# Patient Record
Sex: Male | Born: 1935
Health system: Southern US, Community
[De-identification: ages and names within clinical notes are randomized; demographics above are authoritative.]

## PROBLEM LIST (undated history)

## (undated) DIAGNOSIS — I2510 Atherosclerotic heart disease of native coronary artery without angina pectoris: Secondary | ICD-10-CM

## (undated) DIAGNOSIS — E782 Mixed hyperlipidemia: Secondary | ICD-10-CM

## (undated) DIAGNOSIS — K219 Gastro-esophageal reflux disease without esophagitis: Secondary | ICD-10-CM

## (undated) DIAGNOSIS — I119 Hypertensive heart disease without heart failure: Secondary | ICD-10-CM

## (undated) DIAGNOSIS — I351 Nonrheumatic aortic (valve) insufficiency: Secondary | ICD-10-CM

## (undated) HISTORY — DX: Mixed hyperlipidemia: E78.2

## (undated) HISTORY — DX: Nonrheumatic aortic (valve) insufficiency: I35.1

## (undated) HISTORY — DX: Atherosclerotic heart disease of native coronary artery without angina pectoris: I25.10

## (undated) HISTORY — DX: Gastro-esophageal reflux disease without esophagitis: K21.9

## (undated) HISTORY — DX: Hypertensive heart disease without heart failure: I11.9

---

## 1998-08-09 HISTORY — PX: CARDIAC CATHETERIZATION: SHX172

## 1999-06-08 ENCOUNTER — Inpatient Hospital Stay (HOSPITAL_COMMUNITY): Admission: EM | Admit: 1999-06-08 | Discharge: 1999-06-11 | Payer: Self-pay | Admitting: Emergency Medicine

## 1999-06-30 ENCOUNTER — Encounter: Admission: RE | Admit: 1999-06-30 | Discharge: 1999-06-30 | Payer: Self-pay | Admitting: Internal Medicine

## 2003-08-10 DIAGNOSIS — I119 Hypertensive heart disease without heart failure: Secondary | ICD-10-CM

## 2003-08-10 HISTORY — DX: Hypertensive heart disease without heart failure: I11.9

## 2005-02-24 ENCOUNTER — Ambulatory Visit: Payer: Self-pay | Admitting: Internal Medicine

## 2005-02-24 LAB — HM COLONOSCOPY

## 2005-04-09 ENCOUNTER — Ambulatory Visit: Payer: Self-pay | Admitting: Cardiology

## 2006-08-09 HISTORY — PX: COLONOSCOPY: SHX174

## 2007-03-07 ENCOUNTER — Ambulatory Visit: Payer: Self-pay | Admitting: Cardiology

## 2007-03-15 ENCOUNTER — Ambulatory Visit: Payer: Self-pay

## 2007-03-15 ENCOUNTER — Encounter: Payer: Self-pay | Admitting: Cardiology

## 2007-03-15 LAB — CONVERTED CEMR LAB
ALT: 22 units/L (ref 0–53)
AST: 20 units/L (ref 0–37)
Albumin: 3.8 g/dL (ref 3.5–5.2)
Alkaline Phosphatase: 62 units/L (ref 39–117)
BUN: 17 mg/dL (ref 6–23)
Bilirubin, Direct: 0.1 mg/dL (ref 0.0–0.3)
CO2: 27 meq/L (ref 19–32)
Calcium: 9.4 mg/dL (ref 8.4–10.5)
Chloride: 109 meq/L (ref 96–112)
Cholesterol: 134 mg/dL (ref 0–200)
Creatinine, Ser: 1 mg/dL (ref 0.4–1.5)
GFR calc Af Amer: 96 mL/min
GFR calc non Af Amer: 79 mL/min
Glucose, Bld: 108 mg/dL — ABNORMAL HIGH (ref 70–99)
HDL: 36.3 mg/dL — ABNORMAL LOW (ref >39.0)
LDL Cholesterol: 83 mg/dL (ref 0–99)
Potassium: 3.9 meq/L (ref 3.5–5.1)
Sodium: 141 meq/L (ref 135–145)
Total Bilirubin: 1.2 mg/dL (ref 0.3–1.2)
Total CHOL/HDL Ratio: 3.7
Total Protein: 6.6 g/dL (ref 6.0–8.3)
Triglycerides: 73 mg/dL (ref 0–149)
VLDL: 15 mg/dL (ref 0–40)

## 2007-05-12 ENCOUNTER — Ambulatory Visit: Payer: Self-pay | Admitting: Cardiology

## 2007-07-27 ENCOUNTER — Ambulatory Visit: Payer: Self-pay | Admitting: Cardiology

## 2007-07-27 ENCOUNTER — Encounter: Payer: Self-pay | Admitting: Cardiology

## 2007-07-27 LAB — CONVERTED CEMR LAB
ALT: 23 units/L (ref 0–53)
AST: 18 units/L (ref 0–37)
Albumin: 4.3 g/dL (ref 3.5–5.2)
Alkaline Phosphatase: 63 units/L (ref 39–117)
Bilirubin, Direct: 0.2 mg/dL (ref 0.0–0.3)
Cholesterol: 132 mg/dL (ref 0–200)
HDL: 51 mg/dL (ref >39)
Indirect Bilirubin: 0.6 mg/dL (ref 0.0–0.9)
LDL Cholesterol: 69 mg/dL (ref 0–99)
Total Bilirubin: 0.8 mg/dL (ref 0.3–1.2)
Total CHOL/HDL Ratio: 2.6
Total Protein: 6.9 g/dL (ref 6.0–8.3)
Triglycerides: 60 mg/dL (ref <150)
VLDL: 12 mg/dL (ref 0–40)

## 2008-10-25 ENCOUNTER — Ambulatory Visit: Payer: Self-pay | Admitting: Cardiology

## 2008-11-01 ENCOUNTER — Ambulatory Visit: Payer: Self-pay | Admitting: Cardiology

## 2008-11-01 LAB — CONVERTED CEMR LAB
ALT: 17 units/L (ref 0–53)
AST: 18 units/L (ref 0–37)
Albumin: 4.2 g/dL (ref 3.5–5.2)
Alkaline Phosphatase: 65 units/L (ref 39–117)
BUN: 19 mg/dL (ref 6–23)
CO2: 20 meq/L (ref 19–32)
Calcium: 8.9 mg/dL (ref 8.4–10.5)
Chloride: 110 meq/L (ref 96–112)
Cholesterol: 140 mg/dL (ref 0–200)
Creatinine, Ser: 0.92 mg/dL (ref 0.40–1.50)
Glucose, Bld: 95 mg/dL (ref 70–99)
HDL: 45 mg/dL (ref >39)
LDL Cholesterol: 80 mg/dL (ref 0–99)
Potassium: 4.1 meq/L (ref 3.5–5.3)
Sodium: 141 meq/L (ref 135–145)
Total Bilirubin: 0.7 mg/dL (ref 0.3–1.2)
Total CHOL/HDL Ratio: 3.1
Total Protein: 6.9 g/dL (ref 6.0–8.3)
Triglycerides: 76 mg/dL (ref <150)
VLDL: 15 mg/dL (ref 0–40)

## 2008-11-19 ENCOUNTER — Ambulatory Visit: Payer: Self-pay

## 2008-11-19 ENCOUNTER — Encounter: Payer: Self-pay | Admitting: Cardiology

## 2008-12-20 ENCOUNTER — Ambulatory Visit: Payer: Self-pay | Admitting: Cardiology

## 2008-12-20 LAB — CONVERTED CEMR LAB
ALT: 16 units/L (ref 0–53)
AST: 18 units/L (ref 0–37)
Albumin: 4.2 g/dL (ref 3.5–5.2)
Alkaline Phosphatase: 61 units/L (ref 39–117)
Bilirubin, Direct: 0.1 mg/dL (ref 0.0–0.3)
Cholesterol: 130 mg/dL (ref 0–200)
HDL: 40 mg/dL (ref >39)
Indirect Bilirubin: 0.6 mg/dL (ref 0.0–0.9)
LDL Cholesterol: 76 mg/dL (ref 0–99)
Total Bilirubin: 0.7 mg/dL (ref 0.3–1.2)
Total CHOL/HDL Ratio: 3.3
Total Protein: 6.9 g/dL (ref 6.0–8.3)
Triglycerides: 70 mg/dL (ref <150)
VLDL: 14 mg/dL (ref 0–40)

## 2008-12-27 DIAGNOSIS — I359 Nonrheumatic aortic valve disorder, unspecified: Secondary | ICD-10-CM | POA: Insufficient documentation

## 2008-12-27 DIAGNOSIS — E785 Hyperlipidemia, unspecified: Secondary | ICD-10-CM | POA: Insufficient documentation

## 2008-12-27 DIAGNOSIS — I2510 Atherosclerotic heart disease of native coronary artery without angina pectoris: Secondary | ICD-10-CM

## 2008-12-27 DIAGNOSIS — I10 Essential (primary) hypertension: Secondary | ICD-10-CM

## 2010-01-06 ENCOUNTER — Ambulatory Visit: Payer: Self-pay

## 2010-01-06 ENCOUNTER — Encounter: Payer: Self-pay | Admitting: Cardiology

## 2010-06-02 ENCOUNTER — Telehealth: Payer: Self-pay | Admitting: Cardiology

## 2010-08-20 ENCOUNTER — Telehealth: Payer: Self-pay | Admitting: Cardiology

## 2010-09-08 NOTE — Progress Notes (Signed)
Summary: refill/ pt out medication  Phone Note Refill Request Message from:  Patient on June 02, 2010 10:03 AM  Refills Requested: Medication #1:  ENALAPRIL MALEATE 20 MG TABS Take 1 tablet by mouth once a day [facility] [phone]  Initial call taken by: Judie Grieve,  June 02, 2010 10:04 AM  Follow-up for Phone Call       Follow-up by: Judithe Modest CMA,  June 02, 2010 10:39 AM    Prescriptions: ENALAPRIL MALEATE 20 MG TABS (ENALAPRIL MALEATE) Take 1 tablet by mouth once a day  #30 Tablet x 6   Entered by:   Judithe Modest CMA   Authorized by:   Marca Ancona, MD   Signed by:   Judithe Modest CMA on 06/02/2010   Method used:   Electronically to        [facility]. [address] [phone]* (retail)       [address] [place], [place]  [id number]       Ph: [phone]       Fax: [phone]   RxID:   [id number]

## 2010-09-10 NOTE — Progress Notes (Signed)
Summary: refill meds  Phone Note Refill Request Call back at Home Phone [phone]   Refills Requested: Medication #1:  SIMVASTATIN 40 MG TABS Take one tablet by mouth daily at bedtime. [facility] in [place] [phone]   Method Requested: Fax to Local Pharmacy Initial call taken by: Lorne Skeens,  August 20, 2010 5:02 PM    Additional Follow-up for Phone Call Additional follow up Details #2::    This med was filled already I will manually fax it into the pharamacy Follow-up by: Burnett Kanaris, CNA,  August 21, 2010 9:55 AM

## 2010-11-03 ENCOUNTER — Other Ambulatory Visit: Payer: Self-pay | Admitting: Cardiology

## 2010-11-05 NOTE — Telephone Encounter (Signed)
[address] °

## 2010-12-21 ENCOUNTER — Other Ambulatory Visit: Payer: Self-pay | Admitting: Cardiology

## 2010-12-22 NOTE — Assessment & Plan Note (Signed)
[facility] OFFICE NOTE   KEM, HENSEN                         MRN:          [id number]  DATE:05/12/2007                            DOB:          1935/09/06    Mr. Bultman is a very pleasant 75 year old gentleman who has a history of  coronary disease.  He has had previous catheterization in October 2000  that showed an LAD with a distal diffuse 50% lesion that filled distally  from left to left collaterals originating from the LAD and circumflex.  The rest of his coronary anatomy was normal.  When I last saw him, we  scheduled him to have a Myoview, which was performed on March 15, 2007.  His ejection fraction was 59%, and there was normal perfusion.  An  echocardiogram performed on March 15, 2007 showed normal LV function,  mild to moderate aortic insufficiency, moderate left atrial enlargement.  Since I last saw him, he is doing well from a symptomatic standpoint.  There is no dyspnea on exertion, orthopnea, PND, pedal edema,  palpitations, presyncope, syncope, or chest pain.  There was no  claudication.   MEDICATIONS:  Include:  1. Atenolol 25 mg p.o. daily.  2. Lovastatin 40 mg p.o. daily.  3. Aspirin 325 mg p.o. daily.  4. Omeprazole 40 mg p.o. daily.  5. Enalapril 20 mg p.o. daily.   PHYSICAL EXAMINATION:  Shows a blood pressure of 162/82.  His pulse is  60.  He weighs 181 pounds.  HEENT:  Normal.  NECK:  Supple with no bruits.  CHEST:  Clear.  CARDIOVASCULAR EXAM:  Regular rate and rhythm.  ABDOMINAL EXAM:  Benign.  EXTREMITIES:  Show no edema.   DIAGNOSES:  1. Coronary artery disease.  Mr. Bartolomei is having no chest pain or      shortness of breath.  His recent Myoview showed normal perfusion.      We will continue with medical therapy.  This will include his      aspirin, beta blocker, ACE inhibitor, and statin.  2. Hyperlipidemia.  His recent lipids and liver showed an LDL of 83.      His HDL  was mildly decreased at 36.3.  We recommended Niaspan, but      he states it is not covered by insurance, and he cannot afford      this.  We will, therefore, increase his lovastatin to 80 mg p.o.      daily.  I will check lipids and liver in 6 weeks.  3. Hypertension.  His blood pressure is mildly elevated today.  I have      asked him to obtain a blood pressure cuff for home monitoring, and      to track this for [place].  If his systolics are greater than 140 or if      his diastolics are greater than 85, then he will contact us and we      will increase his enalapril further versus adding low-dose      hydrochlorothiazide.  4. Mild to moderate aortic  insufficiency.  This will be followed as an      outpatient.   He will continue with risk factor modification including diet and  exercise.  I will see him back in 9 months.     [name] [name], MD, [facility]  Electronically Signed    [name]  DD: 05/12/2007  DT: 05/12/2007  Job #: [id number]

## 2010-12-22 NOTE — Assessment & Plan Note (Signed)
[facility] OFFICE NOTE   James Mora, James Mora                         MRN:          [id number]  DATE:10/25/2008                            DOB:          1936/03/01    PRIMARY CARE DOCTOR:  Conley Simmonds, PA at [facility].   HISTORY OF PRESENT ILLNESS:  This is a 75 year old with a history of  coronary artery disease, hypertension, and hyperlipidemia.  The patient  did have a non-ST elevation MI in 2000.  He was found by left heart  catheterization to have severe diffuse distal LAD disease  The distal  LAD did fill by left-to-left collaterals.  He additionally had a Myoview  done in August 2008 that showed normal perfusion with no ischemia or  infarction.  Since he was last seen in our clinic, the patient has been  doing quite well.  He has had no episodes of chest pain.  He is quite  active.  He works part-time at [employer].  He is on his feet most of the  day.  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  He has no orthopnea or PND.  No  palpitations or syncope.   PAST MEDICAL HISTORY:  1. Coronary artery disease.  The patient did have a non-ST elevation      MI in 2000.  Left heart catheterization showed severe diffuse      distal LAD disease.  The distal LAD did fill by left-to-left      collaterals from the more proximal LAD and the circumflex.  He had      a Myoview done in August 2008, which showed EF of 59% and normal      perfusion.  2. Hyperlipidemia.  3. Hypertension.  4. Mild-to-moderate aortic insufficiency.  The patient's most recent      echocardiogram was in August 2008 showed an EF of 60%.  There is      mild-to-moderate aortic insufficiency.  There is no aortic      stenosis.  There was moderate left atrial enlargement.   SOCIAL HISTORY:  The patient works part-time at [employer].  He has never  smoked.   EKG today  shows normal sinus rhythm and has a normal EKG.   MEDICATIONS:  1. Atenolol 25 mg daily.  2. Aspirin 325 mg daily.  3. Omeprazole 20 mg daily.  4. Enalapril 20 mg daily.  5. Lovastatin 80 mg daily.   PHYSICAL EXAMINATION:  VITAL SIGNS:  Blood pressure is 152/98 and heart  rate is 76.  GENERAL:  This is a well-developed male in no apparent distress.  NEUROLOGIC:  Alert and orient x3.  Normal affect.  NECK:  There is no JVD.  There is no thyromegaly or thyroid nodule.  LUNGS:  Clear to auscultation bilaterally.  Normal respiratory effort.  CARDIOVASCULAR:  Heart regular.  S1 and S2.  There is a soft S4.  There  is a 1/6 early peaking systolic murmur at the right upper sternal  border.  There is no diastolic murmur that I can hear.  The patient does  have prominent carotid upstrokes.  EXTREMITIES:  There is 1+ edema of the ankles and there are 2+ posterior  tibial pulses bilaterally.  No clubbing or cyanosis.  ABDOMEN:  Soft, nontender.  No hepatosplenomegaly.  Normal bowel sounds.   ASSESSMENT AND PLAN:  This is a 75 year old with history of coronary  artery disease, hyperlipidemia, and hypertension, who presents to  Cardiology clinic for followup of his artery coronary disease.  1. Coronary artery disease.  The patient has had no recent ischemic      symptoms.  He had a normal treadmill Myoview in 2008.  He is on a      good regimen of medications.  We will continue him on his aspirin,      beta-blocker, ACE inhibitor, and statin.  2. Hyperlipidemia.  We have no recent lipids done, we will bring the      patient back next week to check his fasting lipids and LFTs.  If      his LDL is greater than 70, probably we would change his statin to      simvastatin or atorvastatin to try to work for a goal LDL less than      70.  3. Hypertension.  The patient's blood pressure is elevated at 152/98      today.  We will increase his atenolol today to 50 mg daily.  When      he comes back for  his blood work next week, we will check his blood      pressure.  If his blood pressure is still greater than 140/90, we      will increase his enalapril at that time.  4. Aortic insufficiency.  The patient did have mild moderate aortic      insufficiency on an echocardiogram in 2008.  Probably it is time      for him to get a repeat screening echocardiogram to further assess      his aortic valve.  I do not hear a significant diastolic murmur.      However, he does have quite prominent carotid upstrokes.  We will      get an echocardiogram done in the next couple of weeks.     Marca Ancona, MD  Electronically Signed    DM/MedQ  DD: 10/25/2008  DT: 10/26/2008  Job #: [id number]   cc:   Dortha Kern, PA

## 2010-12-22 NOTE — Assessment & Plan Note (Signed)
[facility]                            CARDIOLOGY OFFICE NOTE   BRENNON, OTTERNESS                         MRN:          [id number]  DATE:03/07/2007                            DOB:          16-Mar-1936    James Mora is a very pleasant gentleman that has previously been followed  by Dr. Andee Lineman.  He had a non-Q wave myocardial infarction in 2000, and  underwent cardiac catheterization at that time.  He left main was  normal.  He had distal diffuse 50% disease that filled from left  collaterals originating from the LAD and left collaterals originating  from the circumflex.  The circumflex and mitral coronary arteries were  normal.  His ejection fraction was preserved, but there was an area of  inferior apical akinesis.  His most recent echocardiogram was performed  on March 13, 2002.  He had normal LV function, mild aortic root  dilatation, and mild aortic insufficiency.  His most recent Myoview was  performed on October 24, 2002.  His ejection fraction was 60%.  There was  normal perfusion.  Since he was last seen, he denies any dyspnea on  exertion, orthopnea, PND, palpitations, presyncope, syncope, or chest  pain.  He occasionally has minimal pedal edema.  He does not smoke.  He  is not exercising routinely, but he is following a diet.   His medications include:  1. Enalapril 5 mg p.o. daily.  2. Atenolol 25 mg p.o. daily.  3. Lovastatin 40 mg p.o. daily.  4. Aspirin 325 mg p.o. daily.  5. Omeprazole 40 mg p.o. daily.   PHYSICAL EXAMINATION:  Shows a blood pressure of 160/100, and his pulse  is 67.  He weighs 189 pounds.  HEENT:  Normal.  NECK:  Supple with no bruits.  CHEST:  Clear.  CARDIOVASCULAR:  Exam reveals a regular rate and rhythm.  ABDOMINAL EXAM:  Benign with no pulsatile masses, and no bruits.  EXTREMITIES:  Show no edema.  His electrocardiogram shows a sinus rhythm at a rate of 67.  There were  no ST changes noted.   DIAGNOSES:  1.  Coronary artery disease.  The patient is doing well from a      symptomatic standpoint with no chest pain or shortness of breath.      However, it has been over 4 years since his previous stress test.      We will proceed with this in the near future.  If it shows no      ischemia, then we will continue with medical therapy.  He will      continue on his aspirin, ACE inhibitor, beta blocker, and statin.  2. Hyperlipidemia.  He will continue on his statin.  We will check      lipids and liver, and adjust with a goal LDL of less than 70.  3. Hypertension.  His blood pressure is elevated today.  We will      increase his enalapril to 20 mg p.o. daily.  I will check a BMET in      1  week to follow his potassium and renal function.  4. History of mildly dilated aortic root and mild aortic      insufficiency.  We will repeat his echocardiogram.   I will see him back in 12 months if his Myoview shows no ischemia.  He  will continue with risk factor modification.  We discussed the  importance of diet and exercise.     [name] [name], MD, [facility]  Electronically Signed    [name]  DD: 03/07/2007  DT: 03/08/2007  Job #: [id number]

## 2011-03-03 ENCOUNTER — Encounter: Payer: Self-pay | Admitting: Cardiology

## 2011-03-23 ENCOUNTER — Encounter: Payer: Self-pay | Admitting: *Deleted

## 2011-03-24 ENCOUNTER — Encounter: Payer: Self-pay | Admitting: *Deleted

## 2011-04-05 ENCOUNTER — Other Ambulatory Visit: Payer: Self-pay

## 2011-04-05 ENCOUNTER — Other Ambulatory Visit: Payer: Self-pay | Admitting: Cardiology

## 2011-04-05 DIAGNOSIS — I359 Nonrheumatic aortic valve disorder, unspecified: Secondary | ICD-10-CM

## 2011-04-06 ENCOUNTER — Other Ambulatory Visit (INDEPENDENT_AMBULATORY_CARE_PROVIDER_SITE_OTHER): Payer: Medicare Other | Admitting: *Deleted

## 2011-04-06 DIAGNOSIS — I359 Nonrheumatic aortic valve disorder, unspecified: Secondary | ICD-10-CM

## 2011-07-30 ENCOUNTER — Other Ambulatory Visit: Payer: Self-pay | Admitting: Cardiology

## 2011-09-05 ENCOUNTER — Other Ambulatory Visit: Payer: Self-pay | Admitting: Cardiology

## 2011-09-30 ENCOUNTER — Other Ambulatory Visit: Payer: Self-pay | Admitting: Cardiology

## 2011-09-30 NOTE — Telephone Encounter (Signed)
..   Requested Prescriptions   Pending Prescriptions Disp Refills  . atenolol (TENORMIN) 50 MG tablet [Pharmacy Med Name: ATENOLOL 50 MG TABLET] 30 tablet 1    Sig: take 1 tablet by mouth once daily

## 2011-10-06 ENCOUNTER — Other Ambulatory Visit: Payer: Self-pay | Admitting: Cardiology

## 2011-10-13 ENCOUNTER — Telehealth: Payer: Self-pay | Admitting: *Deleted

## 2011-10-13 ENCOUNTER — Telehealth: Payer: Self-pay | Admitting: Cardiology

## 2011-10-13 NOTE — Telephone Encounter (Signed)
Pt called in today upset that last month and yesterday for his refill for enalapril he had no further refills on the bottle. I explained to the pt that he had not seen Dr. Shirlee Latch since 2010 in the [facility] office. Pt then states he had an echo done 03/2011, I told him the echo has no bearing on his refills, that he needs and appt to see the Dr. Rock Nephew then asked if he could see Dr. Shirlee Latch this Friday 10/15/11. I explained the dr had no openings on that day, but that I will send a note to Dr. Alford Highland nurse Katina Dung, RN to s/w Dr. Shirlee Latch as to what he wants the pt to do (ie) to be seen with him in the [facility] office or switch pt to either Dr. Mariah Milling or Dr. Kirke Corin in the [facility] office. I told pt that if he did not hear anything by the end of next week 3/15 then to please call  [phone] [facility] office to see what Dr. Shirlee Latch says, pt gave me verbal understanding to all of this. Danielle Rankin

## 2011-10-13 NOTE — Telephone Encounter (Signed)
Could try to squeeze him into my Friday [place] schedule.  I will not be in [place] any longer after that.  If he can't come Friday, he can either see me in [place] next week or if he wants to stay in [place], see Dr. Mariah Milling next week.

## 2011-10-13 NOTE — Telephone Encounter (Signed)
Will forward to Carol Fiato. 

## 2011-10-14 ENCOUNTER — Encounter: Payer: Self-pay | Admitting: *Deleted

## 2011-10-14 ENCOUNTER — Ambulatory Visit: Payer: Medicare Other | Admitting: Physician Assistant

## 2011-10-15 ENCOUNTER — Encounter: Payer: Self-pay | Admitting: Cardiology

## 2011-10-15 ENCOUNTER — Ambulatory Visit (INDEPENDENT_AMBULATORY_CARE_PROVIDER_SITE_OTHER): Payer: Medicare Other | Admitting: Cardiology

## 2011-10-15 VITALS — BP 177/100 | HR 68 | Ht 70.0 in | Wt 187.0 lb

## 2011-10-15 DIAGNOSIS — I359 Nonrheumatic aortic valve disorder, unspecified: Secondary | ICD-10-CM

## 2011-10-15 DIAGNOSIS — I10 Essential (primary) hypertension: Secondary | ICD-10-CM

## 2011-10-15 DIAGNOSIS — I2510 Atherosclerotic heart disease of native coronary artery without angina pectoris: Secondary | ICD-10-CM

## 2011-10-15 DIAGNOSIS — E785 Hyperlipidemia, unspecified: Secondary | ICD-10-CM

## 2011-10-15 MED ORDER — AMLODIPINE BESYLATE 5 MG PO TABS: 5.0000 mg | ORAL_TABLET | Freq: Every day | ORAL | Status: DC

## 2011-10-15 MED ORDER — ATENOLOL 50 MG PO TABS: 50.0000 mg | ORAL_TABLET | Freq: Every day | ORAL | Status: DC

## 2011-10-15 MED ORDER — ENALAPRIL MALEATE 20 MG PO TABS: 20.0000 mg | ORAL_TABLET | Freq: Every day | ORAL | Status: DC

## 2011-10-15 NOTE — Patient Instructions (Addendum)
Need to obtain liver/lipid from Dr. Christell Faith office. Start on Amlodipine 5 mg take one tablet daily. Follow up with Dr. Kirke Corin in 3 months. Have patient take blood pressure for two weeks and bring the readings to the office for the doctor to review.

## 2011-10-16 NOTE — Assessment & Plan Note (Signed)
Stable with no ischemic symptoms.  Continue ASA 81, statin, beta blocker, ACEI.

## 2011-10-16 NOTE — Assessment & Plan Note (Signed)
Lipids done recently with PCP.  Will call for copy of labs.  Goal LDL < 70.   As I am going to be only in the [place] office in the future, I will have Mr Carline followup with Dr. Kirke Corin in 3 months in the [place] office.

## 2011-10-16 NOTE — Progress Notes (Signed)
PCP: Dr. Joanie Coddington  76 yo with history of CAD and aortic insufficiency presents for cardiology followup.  I have not seen him for a couple of years now.  He has been doing well from a cardiac standpoint.  No chest pain, no exertional dyspnea.  He is active in general.  His last echo in 8/12 showed mild to moderate aortic insufficiency and preserved EF. His last myoview in 2008 was low risk.    James Mora has been under a lot of stress recently.  He lost his job at [employer] recently.  He was also found to have elevated PSA and has a urology appointment.  His BP has been running higher, it is 177/100 today.  He has been compliant with his medications.    ECG: NSR, inferior Qs  PMH: 1. CAD: NSTEMI in 2000 with severe diffuse distal LAD disease and L=>L collaterals.  Myoview in 2008 with EF 59%, no ischemia or infarction.  2. Aortic insufficiency: Most recent echo in 8/12 showed EF 55-60%, mild LV hypertrophy, grade I diastolic dysfunction, mild to moderate AI, mild James.  AI had been moderate on 2008 echo.  3. HTN 4. Hyperlipidemia 5. Elevated PSA  SH: Nonsmoker.  Worked for [employer] at [employer] but recently lost his job.  Lives in [place].   FH: CAD  ROS: All systems reviewed and negative except as per HPI.   Current Outpatient Prescriptions  Medication Sig Dispense Refill  . aspirin 81 MG tablet Take 81 mg by mouth daily.      Marland Kitchen atenolol (TENORMIN) 50 MG tablet Take 1 tablet (50 mg total) by mouth daily.  30 tablet  6  . enalapril (VASOTEC) 20 MG tablet Take 1 tablet (20 mg total) by mouth daily.  30 tablet  6  . nitroGLYCERIN (NITROSTAT) 0.4 MG SL tablet Place 0.4 mg under the tongue every 5 (five) minutes as needed.        Marland Kitchen omeprazole (PRILOSEC) 20 MG capsule Take 20 mg by mouth daily.      . simvastatin (ZOCOR) 40 MG tablet Take 40 mg by mouth at bedtime.        Marland Kitchen amLODipine (NORVASC) 5 MG tablet Take 1 tablet (5 mg total) by mouth daily.  30 tablet  6    BP 177/100  Pulse 68   Ht 5\' 10"  (1.778 m)  Wt 187 lb (84.823 kg)  BMI 26.83 kg/m2  SpO2 95% General: NAD Neck: No JVD, no thyromegaly or thyroid nodule.  Lungs: Clear to auscultation bilaterally with normal respiratory effort. CV: Nondisplaced PMI.  Heart regular S1/S2, +S4, 1/6 SEM RUSB (no diastolic murmur).  Trace ankle edema.  No carotid bruit.  Normal pedal pulses.  Abdomen: Soft, nontender, no hepatosplenomegaly, no distention.  Neurologic: Alert and oriented x 3.  Psych: Normal affect. Extremities: No clubbing or cyanosis.

## 2011-10-16 NOTE — Assessment & Plan Note (Signed)
Mild to moderate on last echo, seems somewhat improved.  No diastolic murmur.  Need to keep BP controlled to limit progression.

## 2011-10-16 NOTE — Assessment & Plan Note (Signed)
BP too high.  Continue atenolol and enalapril.  Add amlodipine 5 mg daily.  He will check his BP and we will call for BP numbers in about 2 wks to see if amlodipine needs to be titrated up.

## 2011-10-20 ENCOUNTER — Telehealth: Payer: Self-pay | Admitting: *Deleted

## 2011-10-20 MED ORDER — ATORVASTATIN CALCIUM 20 MG PO TABS: 20.0000 mg | ORAL_TABLET | Freq: Every day | ORAL | Status: DC

## 2011-10-20 NOTE — Telephone Encounter (Signed)
Per Dr Morley Kos simvastatin to atorvastatin 20mg  daily due to possible interaction with amlodipine. LMTCB for pt.

## 2011-10-20 NOTE — Telephone Encounter (Signed)
Fu call °Patient returning your call °

## 2011-10-20 NOTE — Telephone Encounter (Signed)
Discussed with pt. He will stop simvastatin and start atorvastatin 20mg  daily

## 2011-11-04 ENCOUNTER — Telehealth: Payer: Self-pay | Admitting: Cardiology

## 2011-11-04 NOTE — Telephone Encounter (Signed)
Pt came into ofc stated that he saw a urologist and they want to do a biopsy and needs to stop his ASA for 2 weeks. Is pt ok to do this?

## 2011-11-04 NOTE — Telephone Encounter (Signed)
PT AWARE OKAY TO HOLD ASA FOR 3-4 DAYS PRIOR TO  BIOPSY,./CY

## 2011-11-04 NOTE — Telephone Encounter (Signed)
Stopping ASA 3-4 days prior should be sufficient.

## 2012-01-17 ENCOUNTER — Ambulatory Visit (INDEPENDENT_AMBULATORY_CARE_PROVIDER_SITE_OTHER): Payer: Medicare Other | Admitting: Cardiovascular Disease

## 2012-01-17 ENCOUNTER — Encounter: Payer: Self-pay | Admitting: Cardiovascular Disease

## 2012-01-17 VITALS — BP 142/82 | HR 60 | Ht 70.0 in | Wt 187.5 lb

## 2012-01-17 DIAGNOSIS — I10 Essential (primary) hypertension: Secondary | ICD-10-CM

## 2012-01-17 DIAGNOSIS — E785 Hyperlipidemia, unspecified: Secondary | ICD-10-CM

## 2012-01-17 DIAGNOSIS — I2510 Atherosclerotic heart disease of native coronary artery without angina pectoris: Secondary | ICD-10-CM

## 2012-01-17 DIAGNOSIS — I359 Nonrheumatic aortic valve disorder, unspecified: Secondary | ICD-10-CM

## 2012-01-17 NOTE — Progress Notes (Signed)
HPI  76 yo with history of CAD and aortic insufficiency presents for cardiology followup. He is a patient of Dr. Shirlee Latch who will be following up with me.  He has been doing well from a cardiac standpoint. No chest pain, no exertional dyspnea. He is active in general. His last echo in 8/12 showed mild to moderate aortic insufficiency and preserved EF. His last myoview in 2008 was low risk.   He was also found to have elevated PSA which was felt to be due to infection. It improved with antibiotics. No biopsy was performed.  No Known Allergies   Current Outpatient Prescriptions on File Prior to Visit  Medication Sig Dispense Refill  . amLODipine (NORVASC) 5 MG tablet Take 1 tablet (5 mg total) by mouth daily.  30 tablet  6  . atenolol (TENORMIN) 50 MG tablet Take 1 tablet (50 mg total) by mouth daily.  30 tablet  6  . atorvastatin (LIPITOR) 20 MG tablet Take 1 tablet (20 mg total) by mouth daily.  30 tablet  3  . enalapril (VASOTEC) 20 MG tablet Take 1 tablet (20 mg total) by mouth daily.  30 tablet  6  . nitroGLYCERIN (NITROSTAT) 0.4 MG SL tablet Place 0.4 mg under the tongue every 5 (five) minutes as needed.        Marland Kitchen omeprazole (PRILOSEC) 20 MG capsule Take 20 mg by mouth daily.      Marland Kitchen DISCONTD: lovastatin (ALTOPREV) 40 MG 24 hr tablet Take 80 mg by mouth at bedtime.        Marland Kitchen DISCONTD: simvastatin (ZOCOR) 40 MG tablet Take 40 mg by mouth at bedtime.           Past Medical History  Diagnosis Date  . Aortic insufficiency     Mild to moderate  . HLD (hyperlipidemia)     mixed  . HTN (hypertension)     unspecified  . CAD (coronary artery disease)     Severe distal LAD disease with left to left collaterals and cardiac cath in 2000. Most recent nuclear stress test in 2008 was normal.     Past Surgical History  Procedure Date  . Cardiac catheterization 2000    left heart     History reviewed. No pertinent family history.   History   Social History  . Marital Status:  Married    Spouse Name: N/A    Number of Children: N/A  . Years of Education: N/A   Occupational History  . Not on file.   Social History Main Topics  . Smoking status: Never Smoker   . Smokeless tobacco: Not on file  . Alcohol Use: No  . Drug Use: No  . Sexually Active: Not on file   Other Topics Concern  . Not on file   Social History Narrative  . No narrative on file      PHYSICAL EXAM   BP 142/82  Pulse 60  Ht 5\' 10"  (1.778 m)  Wt 187 lb 8 oz (85.049 kg)  BMI 26.90 kg/m2 Constitutional: He is oriented to person, place, and time. He appears well-developed and well-nourished. No distress.  HENT: No nasal discharge.  Head: Normocephalic and atraumatic.  Eyes: Pupils are equal and round. Right eye exhibits no discharge. Left eye exhibits no discharge.  Neck: Normal range of motion. Neck supple. No JVD present. No thyromegaly present.  Cardiovascular: Normal rate, regular rhythm, normal heart sounds and. Exam reveals no gallop and no friction rub. No murmur  heard.  Pulmonary/Chest: Effort normal and breath sounds normal. No stridor. No respiratory distress. He has no wheezes. He has no rales. He exhibits no tenderness.  Abdominal: Soft. Bowel sounds are normal. He exhibits no distension. There is no tenderness. There is no rebound and no guarding.  Musculoskeletal: Normal range of motion. He exhibits +1 edema and no tenderness.  Neurological: He is alert and oriented to person, place, and time. Coordination normal.  Skin: Skin is warm and dry. No rash noted. He is not diaphoretic. No erythema. No pallor.  Psychiatric: He has a normal mood and affect. His behavior is normal. Judgment and thought content normal.       ZOX:WRUEA  Rhythm  -Right bundle branch block.     ASSESSMENT AND PLAN

## 2012-01-17 NOTE — Assessment & Plan Note (Signed)
His blood pressure is reasonably controlled. Continue current treatment. 

## 2012-01-17 NOTE — Patient Instructions (Signed)
Your physician wants you to follow-up in: 6 months with Dr. Arida You will receive a reminder letter in the mail two months in advance. If you don't receive a letter, please call our office to schedule the follow-up appointment.  

## 2012-01-17 NOTE — Assessment & Plan Note (Signed)
I recommend a target LDL of less than 70. Continue treatment with atorvastatin. He gets this checked with his primary care physician.

## 2012-01-17 NOTE — Assessment & Plan Note (Signed)
He has no symptoms suggestive of angina. Continue aggressive medical therapy. Most recent stress test in 2008 was normal.

## 2012-01-17 NOTE — Assessment & Plan Note (Signed)
This is mild to moderate on most recent echocardiogram from last year. There is no heart murmur on physical exam. He appears to be stable. Continue current management.

## 2012-02-08 ENCOUNTER — Other Ambulatory Visit: Payer: Self-pay | Admitting: Cardiology

## 2012-05-08 ENCOUNTER — Other Ambulatory Visit: Payer: Self-pay

## 2012-05-08 MED ORDER — AMLODIPINE BESYLATE 5 MG PO TABS: 5.0000 mg | ORAL_TABLET | Freq: Every day | ORAL | Status: DC

## 2012-05-08 NOTE — Telephone Encounter (Signed)
Refill sent for amlodipine.  

## 2012-06-05 ENCOUNTER — Other Ambulatory Visit: Payer: Self-pay

## 2012-06-05 MED ORDER — ENALAPRIL MALEATE 20 MG PO TABS: 20.0000 mg | ORAL_TABLET | Freq: Every day | ORAL | Status: DC

## 2012-06-05 NOTE — Telephone Encounter (Signed)
Refill sent for enalapril 20 mg take one tablet daily.

## 2012-06-08 ENCOUNTER — Other Ambulatory Visit: Payer: Self-pay | Admitting: Cardiology

## 2012-06-08 ENCOUNTER — Other Ambulatory Visit: Payer: Self-pay | Admitting: *Deleted

## 2012-06-08 MED ORDER — ATORVASTATIN CALCIUM 20 MG PO TABS: 20.0000 mg | ORAL_TABLET | Freq: Every day | ORAL | Status: DC

## 2012-06-08 NOTE — Telephone Encounter (Signed)
Refilled Atorvastatin.

## 2012-06-22 ENCOUNTER — Other Ambulatory Visit: Payer: Self-pay

## 2012-06-22 MED ORDER — ATENOLOL 50 MG PO TABS: 50.0000 mg | ORAL_TABLET | Freq: Every day | ORAL | Status: DC

## 2012-06-22 NOTE — Telephone Encounter (Signed)
Refill sent for atenolol.  

## 2012-08-14 ENCOUNTER — Ambulatory Visit (INDEPENDENT_AMBULATORY_CARE_PROVIDER_SITE_OTHER): Payer: Medicare Other | Admitting: Cardiovascular Disease

## 2012-08-14 ENCOUNTER — Encounter: Payer: Self-pay | Admitting: Cardiovascular Disease

## 2012-08-14 VITALS — BP 142/88 | HR 59 | Ht 70.0 in | Wt 188.8 lb

## 2012-08-14 DIAGNOSIS — I359 Nonrheumatic aortic valve disorder, unspecified: Secondary | ICD-10-CM

## 2012-08-14 DIAGNOSIS — I10 Essential (primary) hypertension: Secondary | ICD-10-CM

## 2012-08-14 DIAGNOSIS — I2510 Atherosclerotic heart disease of native coronary artery without angina pectoris: Secondary | ICD-10-CM

## 2012-08-14 DIAGNOSIS — E785 Hyperlipidemia, unspecified: Secondary | ICD-10-CM

## 2012-08-14 NOTE — Assessment & Plan Note (Signed)
Continue lifelong treatment with atorvastatin if tolerated.

## 2012-08-14 NOTE — Assessment & Plan Note (Signed)
Blood pressure is well controlled on current medications. 

## 2012-08-14 NOTE — Progress Notes (Signed)
HPI  77 yo with history of CAD and aortic insufficiency presents for cardiology followup. He has been doing well from a cardiac standpoint. No chest pain, no exertional dyspnea. He is active in general. His last echo in 8/12 showed mild to moderate aortic insufficiency and preserved EF. His last myoview in 2008 was low risk. Cardiac catheterization in 2000 showed distal LAD disease which has been managed medically.  He has no complaints today and overall has been doing well.  No Known Allergies   Current Outpatient Prescriptions on File Prior to Visit  Medication Sig Dispense Refill  . amLODipine (NORVASC) 5 MG tablet Take 1 tablet (5 mg total) by mouth daily.  30 tablet  6  . aspirin 325 MG tablet Take 325 mg by mouth daily.      Marland Kitchen atenolol (TENORMIN) 50 MG tablet Take 1 tablet (50 mg total) by mouth daily.  30 tablet  6  . atorvastatin (LIPITOR) 20 MG tablet Take 1 tablet (20 mg total) by mouth daily.  30 tablet  3  . nitroGLYCERIN (NITROSTAT) 0.4 MG SL tablet Place 0.4 mg under the tongue every 5 (five) minutes as needed.        Marland Kitchen omeprazole (PRILOSEC) 20 MG capsule Take 20 mg by mouth daily.      . [DISCONTINUED] lovastatin (ALTOPREV) 40 MG 24 hr tablet Take 80 mg by mouth at bedtime.        . [DISCONTINUED] simvastatin (ZOCOR) 40 MG tablet Take 40 mg by mouth at bedtime.           Past Medical History  Diagnosis Date  . Aortic insufficiency     Mild to moderate  . HLD (hyperlipidemia)     mixed  . HTN (hypertension)     unspecified  . CAD (coronary artery disease)     Severe distal LAD disease with left to left collaterals and cardiac cath in 2000. Most recent nuclear stress test in 2008 was normal.     Past Surgical History  Procedure Date  . Cardiac catheterization 2000    left heart     History reviewed. No pertinent family history.   History   Social History  . Marital Status: Married    Spouse Name: N/A    Number of Children: N/A  . Years of Education:  N/A   Occupational History  . Not on file.   Social History Main Topics  . Smoking status: Never Smoker   . Smokeless tobacco: Not on file  . Alcohol Use: No  . Drug Use: No  . Sexually Active: Not on file   Other Topics Concern  . Not on file   Social History Narrative  . No narrative on file      PHYSICAL EXAM   BP 142/88  Pulse 59  Ht 5\' 10"  (1.778 m)  Wt 188 lb 12 oz (85.616 kg)  BMI 27.08 kg/m2 Constitutional: He is oriented to person, place, and time. He appears well-developed and well-nourished. No distress.  HENT: No nasal discharge.  Head: Normocephalic and atraumatic.  Eyes: Pupils are equal and round. Right eye exhibits no discharge. Left eye exhibits no discharge.  Neck: Normal range of motion. Neck supple. No JVD present. No thyromegaly present.  Cardiovascular: Normal rate, regular rhythm, normal heart sounds and. Exam reveals no gallop and no friction rub. No murmur heard.  Pulmonary/Chest: Effort normal and breath sounds normal. No stridor. No respiratory distress. He has no wheezes. He has no  rales. He exhibits no tenderness.  Abdominal: Soft. Bowel sounds are normal. He exhibits no distension. There is no tenderness. There is no rebound and no guarding.  Musculoskeletal: Normal range of motion. He exhibits +1 edema and no tenderness.  Neurological: He is alert and oriented to person, place, and time. Coordination normal.  Skin: Skin is warm and dry. No rash noted. He is not diaphoretic. No erythema. No pallor.  Psychiatric: He has a normal mood and affect. His behavior is normal. Judgment and thought content normal.       [id number]  Rhythm  -Right bundle branch block.     ASSESSMENT AND PLAN

## 2012-08-14 NOTE — Patient Instructions (Addendum)
Continue same medications  Follow up in 1 year

## 2012-08-14 NOTE — Assessment & Plan Note (Signed)
The patient has no symptoms of angina. Continue medical therapy. 

## 2012-08-14 NOTE — Assessment & Plan Note (Signed)
He is asymptomatic and has no cardiac murmurs. Most recent echocardiogram in 2012 showed mild to moderate aortic insufficiency. I will plan on repeat echocardiogram in one year.

## 2012-09-16 ENCOUNTER — Encounter: Payer: Self-pay | Admitting: *Deleted

## 2012-09-16 DIAGNOSIS — K219 Gastro-esophageal reflux disease without esophagitis: Secondary | ICD-10-CM | POA: Insufficient documentation

## 2012-09-16 DIAGNOSIS — I10 Essential (primary) hypertension: Secondary | ICD-10-CM | POA: Insufficient documentation

## 2012-09-16 DIAGNOSIS — E7800 Pure hypercholesterolemia, unspecified: Secondary | ICD-10-CM | POA: Insufficient documentation

## 2012-12-09 ENCOUNTER — Other Ambulatory Visit: Payer: Self-pay | Admitting: Cardiovascular Disease

## 2012-12-25 ENCOUNTER — Telehealth: Payer: Self-pay | Admitting: *Deleted

## 2012-12-25 DIAGNOSIS — I10 Essential (primary) hypertension: Secondary | ICD-10-CM

## 2012-12-25 DIAGNOSIS — I2510 Atherosclerotic heart disease of native coronary artery without angina pectoris: Secondary | ICD-10-CM

## 2012-12-25 MED ORDER — ENALAPRIL MALEATE 20 MG PO TABS: 20.0000 mg | ORAL_TABLET | Freq: Every day | ORAL | Status: DC

## 2012-12-25 NOTE — Telephone Encounter (Signed)
Spoke with pt today due to [place] receiving a refill request for enalapril 20 MG tablet. Medication was not on pt last med list of last ov in 08/2012. Pt is not sure if he should be taking or not? Could you confirm if ok to refill? Please advise.

## 2012-12-25 NOTE — Telephone Encounter (Signed)
Lmtcb regarding enalapril refill sent to [facility].  Pt needs to schedule appointment for Bmet.

## 2012-12-25 NOTE — Telephone Encounter (Signed)
Yes, refill this medication. He has been on this.  Check BMP.

## 2012-12-26 NOTE — Telephone Encounter (Signed)
Pt informed Understanding verb appt made for 5/22 for labs

## 2012-12-28 ENCOUNTER — Ambulatory Visit (INDEPENDENT_AMBULATORY_CARE_PROVIDER_SITE_OTHER): Payer: Medicare Other

## 2012-12-28 DIAGNOSIS — I2510 Atherosclerotic heart disease of native coronary artery without angina pectoris: Secondary | ICD-10-CM

## 2012-12-28 DIAGNOSIS — I10 Essential (primary) hypertension: Secondary | ICD-10-CM

## 2012-12-29 LAB — BASIC METABOLIC PANEL
BUN/Creatinine Ratio: 16 (ref 10–22)
BUN: 18 mg/dL (ref 8–27)
CO2: 26 mmol/L (ref 19–28)
Calcium: 9.1 mg/dL (ref 8.6–10.2)
Creatinine, Ser: 1.14 mg/dL (ref 0.76–1.27)

## 2012-12-29 NOTE — Progress Notes (Signed)
lmtcb

## 2013-01-02 NOTE — Progress Notes (Signed)
Pt informed of lab result

## 2013-01-20 ENCOUNTER — Other Ambulatory Visit: Payer: Self-pay | Admitting: Cardiovascular Disease

## 2013-01-22 ENCOUNTER — Other Ambulatory Visit: Payer: Self-pay | Admitting: *Deleted

## 2013-01-22 MED ORDER — ATENOLOL 50 MG PO TABS: 50.0000 mg | ORAL_TABLET | Freq: Every day | ORAL | Status: DC

## 2013-01-22 NOTE — Telephone Encounter (Signed)
Refilled Atenolol sent to [facility].

## 2013-04-18 ENCOUNTER — Other Ambulatory Visit: Payer: Self-pay | Admitting: Cardiovascular Disease

## 2013-07-04 ENCOUNTER — Other Ambulatory Visit: Payer: Self-pay | Admitting: Cardiovascular Disease

## 2013-07-04 ENCOUNTER — Other Ambulatory Visit: Payer: Self-pay

## 2013-08-17 ENCOUNTER — Ambulatory Visit (INDEPENDENT_AMBULATORY_CARE_PROVIDER_SITE_OTHER): Payer: Medicare Other | Admitting: Podiatry

## 2013-08-17 ENCOUNTER — Encounter: Payer: Self-pay | Admitting: Podiatry

## 2013-08-17 VITALS — BP 146/86 | HR 61 | Resp 16

## 2013-08-17 DIAGNOSIS — I83215 Varicose veins of right lower extremity with both ulcer other part of foot and inflammation: Principal | ICD-10-CM

## 2013-08-17 DIAGNOSIS — I83212 Varicose veins of right lower extremity with both ulcer of calf and inflammation: Principal | ICD-10-CM

## 2013-08-17 DIAGNOSIS — I83213 Varicose veins of right lower extremity with both ulcer of ankle and inflammation: Principal | ICD-10-CM

## 2013-08-17 DIAGNOSIS — I83218 Varicose veins of right lower extremity with both ulcer of other part of lower extremity and inflammation: Principal | ICD-10-CM

## 2013-08-17 DIAGNOSIS — L97919 Non-pressure chronic ulcer of unspecified part of right lower leg with unspecified severity: Secondary | ICD-10-CM

## 2013-08-17 DIAGNOSIS — I83219 Varicose veins of right lower extremity with both ulcer of unspecified site and inflammation: Secondary | ICD-10-CM

## 2013-08-17 DIAGNOSIS — I83211 Varicose veins of right lower extremity with both ulcer of thigh and inflammation: Secondary | ICD-10-CM

## 2013-08-17 DIAGNOSIS — R609 Edema, unspecified: Secondary | ICD-10-CM

## 2013-08-17 DIAGNOSIS — I83214 Varicose veins of right lower extremity with both ulcer of heel and midfoot and inflammation: Principal | ICD-10-CM

## 2013-08-17 DIAGNOSIS — L97929 Non-pressure chronic ulcer of unspecified part of left lower leg with unspecified severity: Secondary | ICD-10-CM

## 2013-08-17 DIAGNOSIS — I83229 Varicose veins of left lower extremity with both ulcer of unspecified site and inflammation: Secondary | ICD-10-CM

## 2013-08-17 NOTE — Patient Instructions (Signed)
Keep unna boot on for the next 4 days and then remove

## 2013-08-17 NOTE — Progress Notes (Signed)
This ankle is acting up again, right medial ankle

## 2013-08-19 NOTE — Progress Notes (Signed)
Subjective:     Patient ID: James Mora, male   DOB: Jul 13, 1936, 78 y.o.   MRN: [id number]  HPI patient states that he is developing some breakdown in his right medial ankle just recently. Has been seen by the doctors for his veins with no other treatments they can accomplish   Review of Systems     Objective:   Physical Exam No change in neurovascular status with superficial breakdown of the medial ankle area measuring about 3 cm x 4 cm in a venous-appearing pattern. There is no erythema edema over her or anything surrounding the area    Assessment:     Probable venous ulceration which patient has been prone to    Plan:     Explained the chronic nature of this condition and today applied Silvadene and Unna boot in order to reduce the stress against the area and we've had success and healing. Reappoint 2 weeks earlier if any issues should occur

## 2013-08-24 ENCOUNTER — Ambulatory Visit: Payer: Medicare Other | Admitting: Podiatry

## 2013-08-28 ENCOUNTER — Other Ambulatory Visit: Payer: Self-pay | Admitting: *Deleted

## 2013-08-28 ENCOUNTER — Ambulatory Visit: Payer: Medicare Other | Admitting: Podiatry

## 2013-08-28 ENCOUNTER — Other Ambulatory Visit: Payer: Self-pay | Admitting: Cardiovascular Disease

## 2013-08-28 ENCOUNTER — Ambulatory Visit (INDEPENDENT_AMBULATORY_CARE_PROVIDER_SITE_OTHER): Payer: Medicare Other | Admitting: Podiatry

## 2013-08-28 ENCOUNTER — Encounter: Payer: Self-pay | Admitting: Podiatry

## 2013-08-28 VITALS — BP 172/90 | HR 74 | Resp 16 | Ht 70.0 in | Wt 185.0 lb

## 2013-08-28 DIAGNOSIS — I83219 Varicose veins of right lower extremity with both ulcer of unspecified site and inflammation: Secondary | ICD-10-CM

## 2013-08-28 DIAGNOSIS — R609 Edema, unspecified: Secondary | ICD-10-CM

## 2013-08-28 DIAGNOSIS — L97919 Non-pressure chronic ulcer of unspecified part of right lower leg with unspecified severity: Principal | ICD-10-CM

## 2013-08-28 DIAGNOSIS — L97929 Non-pressure chronic ulcer of unspecified part of left lower leg with unspecified severity: Principal | ICD-10-CM

## 2013-08-28 DIAGNOSIS — I83229 Varicose veins of left lower extremity with both ulcer of unspecified site and inflammation: Principal | ICD-10-CM

## 2013-08-28 MED ORDER — ENALAPRIL MALEATE 20 MG PO TABS: ORAL_TABLET | ORAL | Status: DC

## 2013-08-28 NOTE — Telephone Encounter (Signed)
Requested Prescriptions   Signed Prescriptions Disp Refills  . enalapril (VASOTEC) 20 MG tablet 30 tablet 3    Sig: take 1 tablet by mouth daily    Authorizing Provider: [name] A    Ordering User: Mehreen Azizi C  ;L

## 2013-08-28 NOTE — Progress Notes (Signed)
Subjective:     Patient ID: James Mora, male   DOB: 1935-10-19, 78 y.o.   MRN: [id number]  HPI patient presents stating it is better than it was but they're still a small area that is open   Review of Systems     Objective:   Physical Exam Neurovascular status unchanged with small area on the medial ankle the toe pin measuring about 8 x 8 mm with a slight weepiness present    Assessment:     Venous ulceration improved right    Plan:     Instructed on continued compression and dispensed Unna boot to use at this time for 4 more days with the assumption that this will heal completely. If any issues were to occur patient is to come back in

## 2013-09-04 ENCOUNTER — Ambulatory Visit (INDEPENDENT_AMBULATORY_CARE_PROVIDER_SITE_OTHER): Payer: Medicare Other | Admitting: Cardiovascular Disease

## 2013-09-04 ENCOUNTER — Encounter: Payer: Self-pay | Admitting: Cardiovascular Disease

## 2013-09-04 VITALS — BP 143/95 | HR 62 | Ht 70.0 in | Wt 195.5 lb

## 2013-09-04 DIAGNOSIS — I2510 Atherosclerotic heart disease of native coronary artery without angina pectoris: Secondary | ICD-10-CM

## 2013-09-04 DIAGNOSIS — I359 Nonrheumatic aortic valve disorder, unspecified: Secondary | ICD-10-CM

## 2013-09-04 DIAGNOSIS — I10 Essential (primary) hypertension: Secondary | ICD-10-CM

## 2013-09-04 DIAGNOSIS — E785 Hyperlipidemia, unspecified: Secondary | ICD-10-CM

## 2013-09-04 NOTE — Patient Instructions (Signed)
Continue same medications.   Your physician wants you to follow-up in: 1 year.  You will receive a reminder letter in the mail two months in advance. If you don't receive a letter, please call our office to schedule the follow-up appointment.  

## 2013-09-04 NOTE — Assessment & Plan Note (Signed)
Most recent lipid profile showed a total cholesterol of 122, triglyceride of 104, HDL 43 and an LDL of 58. Continue treatment with atorvastatin.

## 2013-09-04 NOTE — Assessment & Plan Note (Signed)
He is doing well with no symptoms suggestive of angina. Continue medical therapy. 

## 2013-09-04 NOTE — Progress Notes (Signed)
HPI  78 yo with history of CAD and aortic insufficiency presents for cardiology followup. He has been doing well from a cardiac standpoint. No chest pain, no exertional dyspnea. He is active in general. His last echo in 8/12 showed mild to moderate aortic insufficiency and preserved EF. His last myoview in 2008 was low risk. Cardiac catheterization in 2000 showed distal LAD disease which has been managed medically.  He has no complaints today and overall has been doing well. He does not exercise on a regular basis. He denies orthopnea or PND. He has mild lower extremity edema but he is on amlodipine.  No Known Allergies   Current Outpatient Prescriptions on File Prior to Visit  Medication Sig Dispense Refill  . amLODipine (NORVASC) 5 MG tablet take 1 tablet by mouth once daily  30 tablet  6  . aspirin 325 MG tablet Take 325 mg by mouth daily.      Marland Kitchen atenolol (TENORMIN) 50 MG tablet take 1 tablet by mouth once daily  30 tablet  6  . atorvastatin (LIPITOR) 20 MG tablet Take 1 tablet (20 mg total) by mouth daily.  30 tablet  3  . enalapril (VASOTEC) 20 MG tablet take 1 tablet by mouth daily  30 tablet  3  . nitroGLYCERIN (NITROSTAT) 0.4 MG SL tablet Place 0.4 mg under the tongue every 5 (five) minutes as needed.        Marland Kitchen omeprazole (PRILOSEC) 20 MG capsule Take 20 mg by mouth daily.      . [DISCONTINUED] lovastatin (ALTOPREV) 40 MG 24 hr tablet Take 80 mg by mouth at bedtime.        . [DISCONTINUED] simvastatin (ZOCOR) 40 MG tablet Take 40 mg by mouth at bedtime.         No current facility-administered medications on file prior to visit.     Past Medical History  Diagnosis Date  . Aortic insufficiency     Mild to moderate  . HLD (hyperlipidemia)     mixed  . CAD (coronary artery disease)     Severe distal LAD disease with left to left collaterals and cardiac cath in 2000. Most recent nuclear stress test in 2008 was normal.  . HTN (hypertension) 2005    unspecified  . High blood  cholesterol level 2004  . Acid reflux      Past Surgical History  Procedure Laterality Date  . Cardiac catheterization  2000    left heart  . Colonoscopy  2008    Dr. Allen Norris     Family History  Problem Relation Age of Onset  . Family history unknown: Yes     History   Social History  . Marital Status: Married    Spouse Name: N/A    Number of Children: N/A  . Years of Education: N/A   Occupational History  . Not on file.   Social History Main Topics  . Smoking status: Never Smoker   . Smokeless tobacco: Not on file  . Alcohol Use: No  . Drug Use: No  . Sexual Activity: Not on file   Other Topics Concern  . Not on file   Social History Narrative  . No narrative on file      PHYSICAL EXAM   BP 143/95  Pulse 62  Ht 5\' 10"  (1.778 m)  Wt 195 lb 8 oz (88.678 kg)  BMI 28.05 kg/m2 Constitutional: He is oriented to person, place, and time. He appears well-developed and well-nourished. No  distress.  HENT: No nasal discharge.  Head: Normocephalic and atraumatic.  Eyes: Pupils are equal and round. Right eye exhibits no discharge. Left eye exhibits no discharge.  Neck: Normal range of motion. Neck supple. No JVD present. No thyromegaly present.  Cardiovascular: Normal rate, regular rhythm, normal heart sounds and. Exam reveals no gallop and no friction rub. No murmur heard.  Pulmonary/Chest: Effort normal and breath sounds normal. No stridor. No respiratory distress. He has no wheezes. He has no rales. He exhibits no tenderness.  Abdominal: Soft. Bowel sounds are normal. He exhibits no distension. There is no tenderness. There is no rebound and no guarding.  Musculoskeletal: Normal range of motion. He exhibits +1 edema and no tenderness.  Neurological: He is alert and oriented to person, place, and time. Coordination normal.  Skin: Skin is warm and dry. No rash noted. He is not diaphoretic. No erythema. No pallor.  Psychiatric: He has a normal mood and affect. His  behavior is normal. Judgment and thought content normal.       [id number]  Rhythm  -Right bundle branch block. Left ventricular hypertrophy    ASSESSMENT AND PLAN

## 2013-09-04 NOTE — Assessment & Plan Note (Signed)
This was mild to moderate by most recent echocardiogram in 2012. He has no cardiac murmurs. Consider followup echo in one year.

## 2013-09-04 NOTE — Assessment & Plan Note (Addendum)
Blood pressure is reasonably controlled on current medications. 

## 2013-10-03 ENCOUNTER — Encounter: Payer: Self-pay | Admitting: Cardiology

## 2014-02-03 ENCOUNTER — Other Ambulatory Visit: Payer: Self-pay | Admitting: Cardiovascular Disease

## 2014-03-17 ENCOUNTER — Other Ambulatory Visit: Payer: Self-pay | Admitting: Cardiovascular Disease

## 2014-05-03 ENCOUNTER — Other Ambulatory Visit: Payer: Self-pay

## 2014-05-03 MED ORDER — ENALAPRIL MALEATE 20 MG PO TABS: ORAL_TABLET | ORAL | Status: DC

## 2014-08-15 ENCOUNTER — Other Ambulatory Visit: Payer: Self-pay | Admitting: Cardiovascular Disease

## 2014-08-31 ENCOUNTER — Other Ambulatory Visit: Payer: Self-pay | Admitting: Cardiovascular Disease

## 2014-09-05 ENCOUNTER — Encounter: Payer: Self-pay | Admitting: Cardiovascular Disease

## 2014-09-05 ENCOUNTER — Ambulatory Visit (INDEPENDENT_AMBULATORY_CARE_PROVIDER_SITE_OTHER): Payer: Medicare Other | Admitting: Cardiovascular Disease

## 2014-09-05 VITALS — BP 138/88 | HR 51 | Ht 70.0 in | Wt 193.5 lb

## 2014-09-05 DIAGNOSIS — I10 Essential (primary) hypertension: Secondary | ICD-10-CM

## 2014-09-05 DIAGNOSIS — I2510 Atherosclerotic heart disease of native coronary artery without angina pectoris: Secondary | ICD-10-CM

## 2014-09-05 DIAGNOSIS — E785 Hyperlipidemia, unspecified: Secondary | ICD-10-CM | POA: Diagnosis not present

## 2014-09-05 DIAGNOSIS — I359 Nonrheumatic aortic valve disorder, unspecified: Secondary | ICD-10-CM | POA: Diagnosis not present

## 2014-09-05 NOTE — Assessment & Plan Note (Signed)
Continue treatment with atorvastatin with a target LDL of less than 70. 

## 2014-09-05 NOTE — Patient Instructions (Signed)
Continue same medications.   Your physician wants you to follow-up in: 1 year.  You will receive a reminder letter in the mail two months in advance. If you don't receive a letter, please call our office to schedule the follow-up appointment.  

## 2014-09-05 NOTE — Progress Notes (Signed)
HPI  79 yo with history of CAD and aortic insufficiency presents for cardiology followup. He has been doing well from a cardiac standpoint. No chest pain, no exertional dyspnea. He is active in general. His last echo in 8/12 showed mild to moderate aortic insufficiency and preserved EF. His last myoview in 2008 was low risk. Cardiac catheterization in 2000 showed distal LAD disease which has been managed medically.  He has no complaints today and overall has been doing well. He does not exercise on a regular basis. He denies orthopnea or PND.  He has been taking his medications regularly with no reported side effects.  No Known Allergies   Current Outpatient Prescriptions on File Prior to Visit  Medication Sig Dispense Refill  . amLODipine (NORVASC) 5 MG tablet take 1 tablet by mouth once daily 30 tablet 1  . aspirin 325 MG tablet Take 325 mg by mouth daily.    Marland Kitchen atenolol (TENORMIN) 50 MG tablet take 1 tablet by mouth once daily 30 tablet 6  . atorvastatin (LIPITOR) 20 MG tablet Take 1 tablet (20 mg total) by mouth daily. 30 tablet 3  . enalapril (VASOTEC) 20 MG tablet take 1 tablet by mouth once daily 30 tablet 1  . nitroGLYCERIN (NITROSTAT) 0.4 MG SL tablet Place 0.4 mg under the tongue every 5 (five) minutes as needed.      Marland Kitchen omeprazole (PRILOSEC) 20 MG capsule Take 20 mg by mouth daily.    . [DISCONTINUED] lovastatin (ALTOPREV) 40 MG 24 hr tablet Take 80 mg by mouth at bedtime.      . [DISCONTINUED] simvastatin (ZOCOR) 40 MG tablet Take 40 mg by mouth at bedtime.       No current facility-administered medications on file prior to visit.     Past Medical History  Diagnosis Date  . Aortic insufficiency     Mild to moderate  . HLD (hyperlipidemia)     mixed  . CAD (coronary artery disease)     Severe distal LAD disease with left to left collaterals and cardiac cath in 2000. Most recent nuclear stress test in 2008 was normal.  . HTN (hypertension) 2005    unspecified  . High  blood cholesterol level 2004  . Acid reflux      Past Surgical History  Procedure Laterality Date  . Cardiac catheterization  2000    left heart  . Colonoscopy  2008    Dr. Allen Norris     Family History  Problem Relation Age of Onset  . Family history unknown: Yes     History   Social History  . Marital Status: Married    Spouse Name: N/A    Number of Children: N/A  . Years of Education: N/A   Occupational History  . Not on file.   Social History Main Topics  . Smoking status: Never Smoker   . Smokeless tobacco: Not on file  . Alcohol Use: No  . Drug Use: No  . Sexual Activity: Not on file   Other Topics Concern  . Not on file   Social History Narrative      PHYSICAL EXAM   BP 138/88 mmHg  Pulse 51  Ht 5\' 10"  (1.778 m)  Wt 193 lb 8 oz (87.771 kg)  BMI 27.76 kg/m2 Constitutional: He is oriented to person, place, and time. He appears well-developed and well-nourished. No distress.  HENT: No nasal discharge.  Head: Normocephalic and atraumatic.  Eyes: Pupils are equal and round. Right eye  exhibits no discharge. Left eye exhibits no discharge.  Neck: Normal range of motion. Neck supple. No JVD present. No thyromegaly present.  Cardiovascular: Normal rate, regular rhythm, normal heart sounds and. Exam reveals no gallop and no friction rub. No murmur heard.  Pulmonary/Chest: Effort normal and breath sounds normal. No stridor. No respiratory distress. He has no wheezes. He has no rales. He exhibits no tenderness.  Abdominal: Soft. Bowel sounds are normal. He exhibits no distension. There is no tenderness. There is no rebound and no guarding.  Musculoskeletal: Normal range of motion. He exhibits +1 edema and no tenderness.  Neurological: He is alert and oriented to person, place, and time. Coordination normal.  Skin: Skin is warm and dry. No rash noted. He is not diaphoretic. No erythema. No pallor.  Psychiatric: He has a normal mood and affect. His behavior is  normal. Judgment and thought content normal.       [id number]  Bradycardia  -Right bundle branch block.   ABNORMAL       ASSESSMENT AND PLAN

## 2014-09-05 NOTE — Assessment & Plan Note (Signed)
Blood pressure is controlled. He is mildly bradycardic but overall is asymptomatic. I might consider switching him to carvedilol in the future.

## 2014-09-05 NOTE — Assessment & Plan Note (Signed)
He is doing very well and has no symptoms suggestive of angina. Continue medical therapy.

## 2014-09-05 NOTE — Assessment & Plan Note (Signed)
He had previous mild to moderate aortic insufficiency. However, he is completely asymptomatic and has no cardiac murmurs by physical exam. Continue clinical observation.

## 2014-11-01 ENCOUNTER — Other Ambulatory Visit: Payer: Self-pay | Admitting: *Deleted

## 2014-11-01 MED ORDER — AMLODIPINE BESYLATE 5 MG PO TABS: 5.0000 mg | ORAL_TABLET | Freq: Every day | ORAL | Status: DC

## 2014-11-01 MED ORDER — ENALAPRIL MALEATE 20 MG PO TABS: 20.0000 mg | ORAL_TABLET | Freq: Every day | ORAL | Status: DC

## 2014-12-27 ENCOUNTER — Other Ambulatory Visit: Payer: Self-pay | Admitting: *Deleted

## 2014-12-27 MED ORDER — ENALAPRIL MALEATE 20 MG PO TABS: 20.0000 mg | ORAL_TABLET | Freq: Every day | ORAL | Status: DC

## 2014-12-27 MED ORDER — AMLODIPINE BESYLATE 5 MG PO TABS: 5.0000 mg | ORAL_TABLET | Freq: Every day | ORAL | Status: DC

## 2015-03-10 ENCOUNTER — Encounter: Payer: Self-pay | Admitting: Family Medicine

## 2015-03-10 ENCOUNTER — Ambulatory Visit (INDEPENDENT_AMBULATORY_CARE_PROVIDER_SITE_OTHER): Payer: Medicare Other | Admitting: Family Medicine

## 2015-03-10 ENCOUNTER — Other Ambulatory Visit: Payer: Self-pay | Admitting: Family Medicine

## 2015-03-10 VITALS — BP 170/90 | HR 70 | Temp 99.0°F | Resp 16 | Wt 193.4 lb

## 2015-03-10 DIAGNOSIS — I10 Essential (primary) hypertension: Secondary | ICD-10-CM

## 2015-03-10 DIAGNOSIS — K219 Gastro-esophageal reflux disease without esophagitis: Secondary | ICD-10-CM | POA: Diagnosis not present

## 2015-03-10 DIAGNOSIS — E785 Hyperlipidemia, unspecified: Secondary | ICD-10-CM

## 2015-03-10 MED ORDER — DEXLANSOPRAZOLE 60 MG PO CPDR: 60.0000 mg | DELAYED_RELEASE_CAPSULE | Freq: Every day | ORAL | Status: DC

## 2015-03-10 NOTE — Progress Notes (Signed)
Patient ID: James Mora, male   DOB: 23-Jun-1936, 79 y.o.   MRN: [id number]   Chief Complaint  Patient presents with  . Follow-up    acid reflux. Pt states he doesn't think the Omeprazole 20 mg  is helping any longer     Subjective:  HPI This 79 year old male noticed a tickle in his throat and slight cough that progressed to a burning in his throat with belching over the past month. Omeprazole does not seem to control this anymore. Sometimes antacids will help.  Prior to Admission medications   Medication Sig Start Date End Date Taking? Authorizing Provider  amLODipine (NORVASC) 5 MG tablet Take 1 tablet (5 mg total) by mouth daily. 12/27/14  Yes Wellington Hampshire, MD  aspirin 325 MG tablet Take 325 mg by mouth daily.   Yes Historical Provider, MD  atenolol (TENORMIN) 50 MG tablet take 1 tablet by mouth once daily 08/15/14  Yes Wellington Hampshire, MD  atorvastatin (LIPITOR) 20 MG tablet Take 1 tablet (20 mg total) by mouth daily. 06/08/12  Yes Wellington Hampshire, MD  enalapril (VASOTEC) 20 MG tablet Take 1 tablet (20 mg total) by mouth daily. 12/27/14  Yes Minna Merritts, MD  nitroGLYCERIN (NITROSTAT) 0.4 MG SL tablet Place 0.4 mg under the tongue every 5 (five) minutes as needed.     Yes Historical Provider, MD  omeprazole (PRILOSEC) 20 MG capsule Take 20 mg by mouth daily.   Yes Historical Provider, MD   Family History  Problem Relation Age of Onset  . Family history unknown: Yes   Past Surgical History  Procedure Laterality Date  . Cardiac catheterization  2000    left heart  . Colonoscopy  2008    Dr. Allen Norris   Patient Active Problem List   Diagnosis Date Noted  . HTN (hypertension)   . High blood cholesterol level   . Acid reflux   . Hyperlipidemia 12/27/2008  . Essential hypertension 12/27/2008  . Coronary atherosclerosis 12/27/2008  . Aortic valve disorder 12/27/2008    Past Medical History  Diagnosis Date  . Aortic insufficiency     Mild to moderate  . HLD  (hyperlipidemia)     mixed  . CAD (coronary artery disease)     Severe distal LAD disease with left to left collaterals and cardiac cath in 2000. Most recent nuclear stress test in 2008 was normal.  . HTN (hypertension) 2005    unspecified  . High blood cholesterol level 2004  . Acid reflux     History   Social History  . Marital Status: Married    Spouse Name: N/A  . Number of Children: N/A  . Years of Education: N/A   Occupational History  . Not on file.   Social History Main Topics  . Smoking status: Never Smoker   . Smokeless tobacco: Not on file  . Alcohol Use: No  . Drug Use: No  . Sexual Activity: Not on file   Other Topics Concern  . Not on file   Social History Narrative    No Known Allergies  Review of Systems  Gastrointestinal: Positive for heartburn.  All other systems reviewed and are negative.    There is no immunization history on file for this patient. Objective:  BP 170/90 mmHg  Pulse 70  Temp(Src) 99 F (37.2 C) (Oral)  Resp 16  Wt 193 lb 6.4 oz (87.726 kg)  Physical Exam  Constitutional: He is oriented to person, place,  and time and well-developed, well-nourished, and in no distress.  HENT:  Head: Normocephalic and atraumatic.  Right Ear: External ear normal.  Left Ear: External ear normal.  Nose: Nose normal.  Mouth/Throat: Oropharynx is clear and moist.  Eyes: EOM are normal. Pupils are equal, round, and reactive to light.  Neck: Normal range of motion. Neck supple.  Cardiovascular: Normal rate and regular rhythm.   Pulmonary/Chest: Effort normal and breath sounds normal.  Abdominal: Soft. Bowel sounds are normal.  Neurological: He is alert and oriented to person, place, and time.  Skin: Skin is warm and dry.  Psychiatric: Memory, affect and judgment normal.     Assessment and Plan :  1. Gastroesophageal reflux disease, esophagitis presence not specified Recent flare of dyspepsia and cough from reflux. Will check for  helicobacter infection causing ulcer formation. May need referral to gastroenterologist if positive and start stronger PPI today (Dexilant samples). Recheck pending reports. - H. pylori breath test  2. Essential hypertension Some elevation of systolic pressure. Tolerating Amlodipine, Atenolol and Enalapril without side effects. Continues follow up with cardiologist routinely. - CBC with Differential/Platelet  3. Hyperlipidemia Fair compliance with low fat diet. Will recheck labs and continue Atorvastatin 20 mg qd without side effects. - Comprehensive metabolic panel - Lipid panel - TSH   Miguel Aschoff MD [facility] 03/10/2015 3:08 PM

## 2015-03-11 ENCOUNTER — Other Ambulatory Visit: Payer: Self-pay | Admitting: *Deleted

## 2015-03-11 ENCOUNTER — Telehealth: Payer: Self-pay

## 2015-03-11 MED ORDER — ATENOLOL 50 MG PO TABS: 50.0000 mg | ORAL_TABLET | Freq: Every day | ORAL | Status: DC

## 2015-03-11 NOTE — Telephone Encounter (Signed)
-----   Message from [name], [place] sent at 03/11/2015  8:35 AM EDT ----- All blood tests essentially normal. Slight elevation of blood sugar and triglycerides probably up due to this not being a fasting test. Awaiting final report of H. Pylori breath test.

## 2015-03-11 NOTE — Telephone Encounter (Signed)
Patient advised as directed below. Patient verbalized understanding.  

## 2015-03-11 NOTE — Telephone Encounter (Signed)
LMTCB

## 2015-03-12 LAB — LIPID PANEL
CHOL/HDL RATIO: 3.4 ratio (ref 0.0–5.0)
Cholesterol, Total: 119 mg/dL (ref 100–199)
HDL: 35 mg/dL — ABNORMAL LOW (ref >39)
LDL Calculated: 41 mg/dL (ref 0–99)
Triglycerides: 216 mg/dL — ABNORMAL HIGH (ref 0–149)
VLDL CHOLESTEROL CAL: 43 mg/dL — AB (ref 5–40)

## 2015-03-12 LAB — CBC WITH DIFFERENTIAL/PLATELET
Basophils Absolute: 0 10*3/uL (ref 0.0–0.2)
Basos: 0 %
EOS (ABSOLUTE): 0 10*3/uL (ref 0.0–0.4)
Eos: 1 %
Hematocrit: 43.5 % (ref 37.5–51.0)
Hemoglobin: 14.9 g/dL (ref 12.6–17.7)
IMMATURE GRANS (ABS): 0 10*3/uL (ref 0.0–0.1)
IMMATURE GRANULOCYTES: 0 %
LYMPHS ABS: 1.4 10*3/uL (ref 0.7–3.1)
Lymphs: 23 %
MCH: 30.2 pg (ref 26.6–33.0)
MCHC: 34.3 g/dL (ref 31.5–35.7)
MCV: 88 fL (ref 79–97)
Monocytes Absolute: 0.5 10*3/uL (ref 0.1–0.9)
Monocytes: 9 %
NEUTROS PCT: 67 %
Neutrophils Absolute: 4 10*3/uL (ref 1.4–7.0)
PLATELETS: 239 10*3/uL (ref 150–379)
RBC: 4.93 x10E6/uL (ref 4.14–5.80)
RDW: 14.2 % (ref 12.3–15.4)
WBC: 6 10*3/uL (ref 3.4–10.8)

## 2015-03-12 LAB — COMPREHENSIVE METABOLIC PANEL
ALT: 15 IU/L (ref 0–44)
AST: 16 IU/L (ref 0–40)
Albumin/Globulin Ratio: 1.5 (ref 1.1–2.5)
Albumin: 4.3 g/dL (ref 3.5–4.8)
Alkaline Phosphatase: 101 IU/L (ref 39–117)
BUN/Creatinine Ratio: 17 (ref 10–22)
BUN: 19 mg/dL (ref 8–27)
Bilirubin Total: 0.7 mg/dL (ref 0.0–1.2)
CO2: 23 mmol/L (ref 18–29)
CREATININE: 1.09 mg/dL (ref 0.76–1.27)
Calcium: 9.4 mg/dL (ref 8.6–10.2)
Chloride: 102 mmol/L (ref 97–108)
GFR calc non Af Amer: 66 mL/min/{1.73_m2} (ref >59)
GFR, EST AFRICAN AMERICAN: 76 mL/min/{1.73_m2} (ref >59)
GLUCOSE: 108 mg/dL — AB (ref 65–99)
Globulin, Total: 2.8 g/dL (ref 1.5–4.5)
Potassium: 4 mmol/L (ref 3.5–5.2)
SODIUM: 142 mmol/L (ref 134–144)
Total Protein: 7.1 g/dL (ref 6.0–8.5)

## 2015-03-12 LAB — TSH: TSH: 3.82 u[IU]/mL (ref 0.450–4.500)

## 2015-03-12 LAB — H. PYLORI BREATH TEST: H. pylori UBiT: NEGATIVE

## 2015-03-14 ENCOUNTER — Telehealth: Payer: Self-pay

## 2015-03-14 NOTE — Telephone Encounter (Signed)
Patient advised as directed below. Patient verbalized understanding and agrees with treatment plan. 

## 2015-03-14 NOTE — Telephone Encounter (Signed)
LMTCB

## 2015-03-14 NOTE — Telephone Encounter (Signed)
-----   Message from [name], [place] sent at 03/14/2015  8:46 AM EDT ----- H. Pylori test negative. Proceed with Dexilant daily for the next 3 weeks then may revert back to the Omeprazole (less expensive). If symptoms not controlled, will need GI referral for endoscopic evaluation of esophagus. Recheck as needed.

## 2015-03-25 ENCOUNTER — Other Ambulatory Visit: Payer: Self-pay | Admitting: Family Medicine

## 2015-04-25 ENCOUNTER — Other Ambulatory Visit: Payer: Self-pay | Admitting: Cardiovascular Disease

## 2015-05-05 ENCOUNTER — Other Ambulatory Visit: Payer: Self-pay | Admitting: Family Medicine

## 2015-05-05 DIAGNOSIS — E7800 Pure hypercholesterolemia, unspecified: Secondary | ICD-10-CM

## 2015-05-05 NOTE — Telephone Encounter (Signed)
James Mora patient 

## 2015-08-15 ENCOUNTER — Other Ambulatory Visit: Payer: Self-pay | Admitting: Cardiovascular Disease

## 2015-09-09 ENCOUNTER — Other Ambulatory Visit: Payer: Self-pay | Admitting: Family Medicine

## 2015-09-27 ENCOUNTER — Other Ambulatory Visit: Payer: Self-pay | Admitting: Cardiovascular Disease

## 2015-10-01 ENCOUNTER — Other Ambulatory Visit: Payer: Self-pay | Admitting: Cardiovascular Disease

## 2015-10-03 ENCOUNTER — Ambulatory Visit: Payer: Medicare Other | Admitting: Cardiovascular Disease

## 2015-10-06 ENCOUNTER — Encounter: Payer: Self-pay | Admitting: Cardiovascular Disease

## 2015-10-06 ENCOUNTER — Ambulatory Visit (INDEPENDENT_AMBULATORY_CARE_PROVIDER_SITE_OTHER): Payer: Medicare Other | Admitting: Cardiovascular Disease

## 2015-10-06 VITALS — BP 160/92 | HR 54 | Ht 70.5 in | Wt 192.5 lb

## 2015-10-06 DIAGNOSIS — I2510 Atherosclerotic heart disease of native coronary artery without angina pectoris: Secondary | ICD-10-CM | POA: Diagnosis not present

## 2015-10-06 DIAGNOSIS — I359 Nonrheumatic aortic valve disorder, unspecified: Secondary | ICD-10-CM

## 2015-10-06 DIAGNOSIS — I10 Essential (primary) hypertension: Secondary | ICD-10-CM

## 2015-10-06 MED ORDER — CARVEDILOL 6.25 MG PO TABS: 6.2500 mg | ORAL_TABLET | Freq: Two times a day (BID) | ORAL | Status: DC

## 2015-10-06 NOTE — Progress Notes (Signed)
Cardiology Office Note   Date:  10/06/2015   ID:  James Mora, DOB 03/30/1936, MRN [id number]  PCP:  Vernie Murders, PA  Cardiologist:   [name], MD   Chief Complaint  Patient presents with  . other    12 month follow up. Meds reviewed by the pt's bottles. "doing well."       History of Present Illness: James Mora is a 80 y.o. male who presents for a follow-up visit. He has known history of coronary artery disease without revascularization being managed medically and mild to moderate aortic insufficiency. His last echo in 8/12 showed mild to moderate aortic insufficiency and preserved EF. His last myoview in 2008 was low risk. Cardiac catheterization in 2000 showed severe distal LAD disease with left to left collaterals which has been managed medically.  He has no complaints today and overall has been doing well. He does not exercise on a regular basis. He denies orthopnea or PND.  He has been taking his medications regularly with no reported side effects.  His blood pressure continues to be elevated. He was noted to be hypertensive also when he visited his primary care physician. He denies dizziness or syncope.    Past Medical History  Diagnosis Date  . Aortic insufficiency     Mild to moderate  . HLD (hyperlipidemia)     mixed  . CAD (coronary artery disease)     Severe distal LAD disease with left to left collaterals and cardiac cath in 2000. Most recent nuclear stress test in 2008 was normal.  . HTN (hypertension) 2005    unspecified  . High blood cholesterol level 2004  . Acid reflux     Past Surgical History  Procedure Laterality Date  . Cardiac catheterization  2000    left heart  . Colonoscopy  2008    Dr. Allen Norris     Current Outpatient Prescriptions  Medication Sig Dispense Refill  . amLODipine (NORVASC) 5 MG tablet take 1 tablet by mouth once daily 30 tablet 3  . aspirin 325 MG tablet Take 325 mg by mouth daily.    Marland Kitchen atorvastatin (LIPITOR)  20 MG tablet take 1 tablet by mouth once daily 30 tablet 6  . enalapril (VASOTEC) 20 MG tablet take 1 tablet by mouth once daily 30 tablet 3  . nitroGLYCERIN (NITROSTAT) 0.4 MG SL tablet Place 0.4 mg under the tongue every 5 (five) minutes as needed.      Marland Kitchen omeprazole (PRILOSEC) 20 MG capsule take 2 capsules by mouth once daily 60 capsule 5  . carvedilol (COREG) 6.25 MG tablet Take 1 tablet (6.25 mg total) by mouth 2 (two) times daily. 60 tablet 11  . [DISCONTINUED] lovastatin (ALTOPREV) 40 MG 24 hr tablet Take 80 mg by mouth at bedtime.      . [DISCONTINUED] simvastatin (ZOCOR) 40 MG tablet Take 40 mg by mouth at bedtime.       No current facility-administered medications for this visit.    Allergies:   Review of patient's allergies indicates no known allergies.    Social History:  The patient  reports that he has never smoked. He does not have any smokeless tobacco history on file. He reports that he does not drink alcohol or use illicit drugs.   Family History:  The patient's Family history is unknown by patient.      PHYSICAL EXAM: VS:  BP 160/92 mmHg  Pulse 54  Ht 5' 10.5" (1.791 m)  Wt  192 lb 8 oz (87.317 kg)  BMI 27.22 kg/m2 , BMI Body mass index is 27.22 kg/(m^2). GEN: Well nourished, well developed, in no acute distress HEENT: normal Neck: no JVD, carotid bruits, or masses Cardiac: RRR; no murmurs, rubs, or gallops,no edema  Respiratory:  clear to auscultation bilaterally, normal work of breathing GI: soft, nontender, nondistended, + BS MS: no deformity or atrophy Skin: warm and dry, no rash Neuro:  Strength and sensation are intact Psych: euthymic mood, full affect   EKG:  EKG is ordered today. The ekg ordered today demonstrates : sinus bradycardia with right bundle branch block. Minimal LVH.   Recent Labs: 03/10/2015: ALT 15; BUN 19; Creatinine, Ser 1.09; Platelets 239; Potassium 4.0; Sodium 142; TSH 3.820    Lipid Panel    Component Value Date/Time   CHOL  119 03/10/2015 1638   CHOL 130 12/20/2008 2241   TRIG 216* 03/10/2015 1638   HDL 35* 03/10/2015 1638   HDL 40 12/20/2008 2241   CHOLHDL 3.4 03/10/2015 1638   CHOLHDL 3.3 Ratio 12/20/2008 2241   VLDL 14 12/20/2008 2241   LDLCALC 41 03/10/2015 1638   LDLCALC 76 12/20/2008 2241      Wt Readings from Last 3 Encounters:  10/06/15 192 lb 8 oz (87.317 kg)  03/10/15 193 lb 6.4 oz (87.726 kg)  09/05/14 193 lb 8 oz (87.771 kg)         ASSESSMENT AND PLAN:  1.  Coronary artery disease involving native coronary arteries of native heart without angina: the patient is overall doing well with no symptoms suggestive of angina. I recommend continuing medical therapy. No indication for stress testing. His EKG is also unchanged.  2. Aortic valve disorder: previous echocardiogram showed mild to moderate aortic insufficiency. I do not hear any heart murmurs by physical exam and he is asymptomatic.  3. Essential hypertension: his blood pressure has been trending up and currently is not controlled. Thus, I elected to switch him from atenolol to carvedilol 6.25 mg twice daily especially that he is mildly bradycardic. I advised him to record his blood pressure readings daily over the next week and call us with the readings to see if we need to adjust any further.  4. Hyperlipidemia: most recent lipid profile in August 2016 showed an LDL of 41. His triglyceride was elevated at 216 and I did discuss with him the importance of improving his diet.      Disposition:   FU with me in 1 years  Signed, [name], MD  10/06/2015 4:03 PM    [facility]

## 2015-10-06 NOTE — Patient Instructions (Signed)
Medication Instructions:  Your physician has recommended you make the following change in your medication:  1. STOP taking atenolol 2. Start Carvedilol 6.25 mg Twice Daily    Labwork: None Ordered  Testing/Procedures: None ordered  Follow-Up: Your physician wants you to follow-up in: 1 year. You will receive a reminder letter in the mail two months in advance. If you don't receive a letter, please call our office to schedule the follow-up appointment.   Any Other Special Instructions Will Be Listed Below (If Applicable). Your physician has requested that you regularly monitor and record your blood pressure readings at home. Please use the same machine at the same time of day to check your readings and record them to bring to your follow-up visit.  Record blood pressures once daily for one week and call in with readings. [phone]    If you need a refill on your cardiac medications before your next appointment, please call your pharmacy.

## 2015-10-13 ENCOUNTER — Telehealth: Payer: Self-pay | Admitting: Cardiovascular Disease

## 2015-10-13 NOTE — Telephone Encounter (Signed)
Pt c/o BP issue: STAT if pt c/o blurred vision, one-sided weakness or slurred speech  1. What are your last 5 BP readings?  10/07/15 am 147/89 HR 67 pm 122/80 HR 75 10/08/15  AM 148/94 HR 82 PM 129/89 HR 85 10/09/15 AM 147/80 HR 78 PM 137/85 HR 81 10/10/15 AM 147/86 HR 69 PM 143/87 HR 77 10/11/15 AM 142/84 HR 69 PM 128/80 HR 88 10/12/15 AM 148/82 HR 80 PM 123/74 80 10/13/15 AM 142/82 HR 63   2. Are you having any other symptoms (ex. Dizziness, headache, blurred vision, passed out)? NO reactions since medication change.   3. What is your BP issue? Was told to record Bp and report them to [place].

## 2015-10-13 NOTE — Telephone Encounter (Signed)
S/w pt regarding MD recommendations. Pt understands to increase coreg to 12.5mg  BID, monitor BP and CB with readings in one week. Pt had no further questions.

## 2015-10-13 NOTE — Telephone Encounter (Signed)
At Feb 21 OV, pt BP 160/92.  Pt instructed to stop atenolol and start coreg 6.25mg  BID, monitor BP and CB with one week readings for MD review. See BP log below.

## 2015-10-13 NOTE — Telephone Encounter (Signed)
BP looks better but can be improved. Increase Coreg to 12.5 mg bid and call us with readings in 1 week.

## 2015-10-20 ENCOUNTER — Other Ambulatory Visit: Payer: Self-pay

## 2015-10-20 ENCOUNTER — Telehealth: Payer: Self-pay | Admitting: Cardiovascular Disease

## 2015-10-20 MED ORDER — CARVEDILOL 12.5 MG PO TABS: 12.5000 mg | ORAL_TABLET | Freq: Two times a day (BID) | ORAL | Status: DC

## 2015-10-20 NOTE — Telephone Encounter (Signed)
S/w pt of Dr. Tyrell Antonio recommendations. Prescription submitted for coreg 12.5mg  BID as it was increased last week.

## 2015-10-20 NOTE — Telephone Encounter (Signed)
Pt c/o BP issue: STAT if pt c/o blurred vision, one-sided weakness or slurred speech  1. What are your last 5 BP readings?  10/14/15 138/72 HR 65 PM 125/69 HR 75 10/15/15 130/71 HR 65 PM 131/71 HR 64 10/16/15 137/77 HR 64 PM 136/81 HR 77 10/17/15 137/78 HR 65 PM 141/76 HR 71 10/18/15 145/80 HR 64 PM 128/77 HR 74 10/19/15 147/85 HR 62 PM 137/73 HR 68  10/20/15 130/72 HR 64  2. Are you having any other symptoms (ex. Dizziness, headache, blurred vision, passed out)? No   3. What is your BP issue? Just was told to record and report them.

## 2015-10-20 NOTE — Telephone Encounter (Signed)
Pt coreg increased to 12.5mg  BID on March 6. Instructed to monitor BP for one week and call w/readings.  He reports the following: 10/14/15 138/72 HR 65 PM 125/69 HR 75 10/15/15 130/71 HR 65 PM 131/71 HR 64 10/16/15 137/77 HR 64 PM 136/81 HR 77 10/17/15 137/78 HR 65 PM 141/76 HR 71 10/18/15 145/80 HR 64 PM 128/77 HR 74 10/19/15 147/85 HR 62 PM 137/73 HR 68  10/20/15 130/72 HR 64  Forward to MD to review and advise.

## 2015-10-20 NOTE — Telephone Encounter (Signed)
Looks good. Continue same treatment.

## 2015-11-03 ENCOUNTER — Telehealth: Payer: Self-pay | Admitting: Cardiovascular Disease

## 2015-11-03 NOTE — Telephone Encounter (Signed)
Spoke w/ pt.  Advised him of Ryan's recommendations. He states that he will not call w/ any more readings as long as they look this good. Asked him to call back w/ any other questions or concerns.

## 2015-11-03 NOTE — Telephone Encounter (Signed)
Readings look good. No changes.

## 2015-11-03 NOTE — Telephone Encounter (Signed)
BP READINGS  3/20   Morning 131/72  Hr 73   Afternoon 133/70  Hr 62  3/21   Morning 133/70  Hr 62  Afternoon  123/67  Hr  74   3/22   Morning  138/73  Hr 58 Afternoon 121/71  Hr 73   3/23   Morning   131/71  Hr 65  Afternoon ---

## 2015-12-14 ENCOUNTER — Other Ambulatory Visit: Payer: Self-pay | Admitting: Cardiovascular Disease

## 2015-12-23 ENCOUNTER — Other Ambulatory Visit: Payer: Self-pay

## 2015-12-23 DIAGNOSIS — E7800 Pure hypercholesterolemia, unspecified: Secondary | ICD-10-CM

## 2015-12-23 NOTE — Telephone Encounter (Signed)
[facility] is requesting refill on the following prescription Atorvastatin 20 MG.  Thanks,  -Audreanna Torrisi

## 2015-12-24 MED ORDER — ATORVASTATIN CALCIUM 20 MG PO TABS: 20.0000 mg | ORAL_TABLET | Freq: Every day | ORAL | Status: DC

## 2016-01-18 ENCOUNTER — Other Ambulatory Visit: Payer: Self-pay | Admitting: Cardiovascular Disease

## 2016-02-21 ENCOUNTER — Other Ambulatory Visit: Payer: Self-pay | Admitting: Family Medicine

## 2016-03-02 ENCOUNTER — Other Ambulatory Visit: Payer: Self-pay | Admitting: Emergency Medicine

## 2016-03-02 MED ORDER — OMEPRAZOLE 20 MG PO CPDR: 40.0000 mg | DELAYED_RELEASE_CAPSULE | Freq: Every day | ORAL | 0 refills | Status: DC

## 2016-03-02 NOTE — Telephone Encounter (Signed)
Pt called and needs a refill on his Omeprazole 20 mg and the pharmacy told him he needed an appt first. He has scheduled an appt on Friday. Can you send him in enough until his appt?

## 2016-03-05 ENCOUNTER — Ambulatory Visit (INDEPENDENT_AMBULATORY_CARE_PROVIDER_SITE_OTHER): Payer: Medicare Other | Admitting: Family Medicine

## 2016-03-05 ENCOUNTER — Encounter: Payer: Self-pay | Admitting: Family Medicine

## 2016-03-05 VITALS — BP 140/64 | HR 56 | Temp 98.0°F | Resp 16 | Wt 186.0 lb

## 2016-03-05 DIAGNOSIS — R351 Nocturia: Secondary | ICD-10-CM | POA: Diagnosis not present

## 2016-03-05 DIAGNOSIS — I10 Essential (primary) hypertension: Secondary | ICD-10-CM | POA: Diagnosis not present

## 2016-03-05 DIAGNOSIS — K219 Gastro-esophageal reflux disease without esophagitis: Secondary | ICD-10-CM

## 2016-03-05 DIAGNOSIS — E785 Hyperlipidemia, unspecified: Secondary | ICD-10-CM

## 2016-03-05 MED ORDER — OMEPRAZOLE 20 MG PO CPDR: 40.0000 mg | DELAYED_RELEASE_CAPSULE | Freq: Every day | ORAL | 6 refills | Status: DC

## 2016-03-05 NOTE — Progress Notes (Signed)
Patient: James Mora Male    DOB: Mar 20, 1936   80 y.o.   MRN: [id number] Visit Date: 03/05/2016  Today's Provider: Vernie Murders, PA   Chief Complaint  Patient presents with  . Hypertension    follow up  . Gastroesophageal Reflux    follow up  . Hyperlipidemia    follow up   Subjective:    HPI  Hypertension, follow-up:  BP Readings from Last 3 Encounters:  03/05/16 140/64  10/06/15 (!) 160/92  03/10/15 (!) 170/90    He was last seen for hypertension 1 years ago.  BP at that visit was 170/90. Management since that visit includes no changes. He reports good compliance with treatment. He is not having side effects.  He is exercising. He is adherent to low salt diet.   Outside blood pressures are not being checked. He is experiencing none.  Patient denies chest pain, chest pressure/discomfort, claudication, dyspnea, exertional chest pressure/discomfort, fatigue, irregular heart beat, lower extremity edema, near-syncope, orthopnea, palpitations, paroxysmal nocturnal dyspnea, syncope and tachypnea.   Cardiovascular risk factors include advanced age (older than 43 for men, 52 for women), dyslipidemia, hypertension and male gender.  Use of agents associated with hypertension: NSAIDS.     Weight trend: decreasing steadily Wt Readings from Last 3 Encounters:  03/05/16 186 lb (84.4 kg)  10/06/15 192 lb 8 oz (87.3 kg)  03/10/15 193 lb 6.4 oz (87.7 kg)    Current diet: in general, a "healthy" diet    ------------------------------------------------------------------------  Lipid/Cholesterol, Follow-up:   Last seen for this1 months ago.  Management changes since that visit include none. . Last Lipid Panel:    Component Value Date/Time   CHOL 119 03/10/2015 1638   TRIG 216 (H) 03/10/2015 1638   HDL 35 (L) 03/10/2015 1638   CHOLHDL 3.4 03/10/2015 1638   CHOLHDL 3.3 Ratio 12/20/2008 2241   VLDL 14 12/20/2008 2241   LDLCALC 41 03/10/2015 1638    Risk  factors for vascular disease include hypercholesterolemia and hypertension  He reports good compliance with treatment. He is not having side effects.  Current symptoms include none and have been stable. Weight trend: decreasing steadily Prior visit with dietician: no Current diet: in general, a "healthy" diet   Current exercise: yard work  [employer] from Last 3 Encounters:  03/05/16 186 lb (84.4 kg)  10/06/15 192 lb 8 oz (87.3 kg)  03/10/15 193 lb 6.4 oz (87.7 kg)    ------------------------------------------------------------------- Follow up GERD:  Patient was last seen 1 year ago and no changes were made. Patient reports good compliance with treatment, good tolerance and good symptom control.    Past Medical History:  Diagnosis Date  . Acid reflux   . Aortic insufficiency    Mild to moderate  . CAD (coronary artery disease)    Severe distal LAD disease with left to left collaterals and cardiac cath in 2000. Most recent nuclear stress test in 2008 was normal.  . High blood cholesterol level 2004  . HLD (hyperlipidemia)    mixed  . HTN (hypertension) 2005   unspecified   Past Surgical History:  Procedure Laterality Date  . CARDIAC CATHETERIZATION  2000   left heart  . COLONOSCOPY  2008   Dr. Allen Norris   Family History  Problem Relation Age of Onset  . Family history unknown: Yes   No Known Allergies Current Meds  Medication Sig  . amLODipine (NORVASC) 5 MG tablet take 1 tablet by mouth once  daily  . aspirin 325 MG tablet Take 325 mg by mouth daily.  Marland Kitchen atorvastatin (LIPITOR) 20 MG tablet Take 1 tablet (20 mg total) by mouth daily.  . carvedilol (COREG) 12.5 MG tablet Take 1 tablet (12.5 mg total) by mouth 2 (two) times daily.  . enalapril (VASOTEC) 20 MG tablet take 1 tablet by mouth once daily  . nitroGLYCERIN (NITROSTAT) 0.4 MG SL tablet Place 0.4 mg under the tongue every 5 (five) minutes as needed.    Marland Kitchen omeprazole (PRILOSEC) 20 MG capsule Take 2 capsules (40 mg  total) by mouth daily.    Review of Systems  Constitutional: Negative for appetite change, chills and fever.  Respiratory: Negative for chest tightness, shortness of breath and wheezing.   Cardiovascular: Negative for chest pain and palpitations.  Gastrointestinal: Negative for abdominal pain, nausea and vomiting.  Genitourinary: Positive for decreased urine volume.       With Nocturia 2-3 times a night. No issues during the day.    Social History  Substance Use Topics  . Smoking status: Never Smoker  . Smokeless tobacco: Never Used  . Alcohol use No   Objective:   BP 140/64 (BP Location: Right Arm, Patient Position: Sitting, Cuff Size: Normal)   Pulse (!) 56   Temp 98 F (36.7 C) (Oral)   Resp 16   Wt 186 lb (84.4 kg)   SpO2 98%   BMI 26.31 kg/m   Physical Exam  Constitutional: He is oriented to person, place, and time. He appears well-developed and well-nourished.  HENT:  Head: Normocephalic.  Right Ear: External ear normal.  Left Ear: External ear normal.  Nose: Nose normal.  Mouth/Throat: Oropharynx is clear and moist.  Eyes: Conjunctivae and EOM are normal.  Neck: Normal range of motion. Neck supple.  Cardiovascular: Normal rate and regular rhythm.   Pulmonary/Chest: Effort normal and breath sounds normal.  Abdominal: Soft. Bowel sounds are normal.  Musculoskeletal: He exhibits no edema.  Scar and frozen right ankle from crush injury 55 years ago with subsequent surgical pinnings. Unable to move ankle joint. Pulses are symmetric in lower extremities.  Neurological: He is alert and oriented to person, place, and time.  Skin: No rash noted.  Psychiatric: He has a normal mood and affect. His behavior is normal.      Assessment & Plan:     1. Gastroesophageal reflux disease, esophagitis presence not specified Denies hematemesis, melena or abdominal pains. Dyspepsia/reflux symptoms return if he goes without Omeprazole more than 2-3 days. Will check routine labs and  encouraged to schedule annual wellness exam in the next 3-4 months. - CBC with Differential/Platelet - Comprehensive metabolic panel - omeprazole (PRILOSEC) 20 MG capsule; Take 2 capsules (40 mg total) by mouth daily.  Dispense: 60 capsule; Refill: 6  2. Essential hypertension Back to normal range for his age. Still taking Enalapril, Carvedilol and Amlodipine daily. Recheck labs and continue follow up with Dr. Fletcher Anon (cardiologist) as planned. - CBC with Differential/Platelet - Comprehensive metabolic panel - TSH  3. Hyperlipidemia Tolerating Atorvastatin without side effects. Continue low fat diet and recheck labs. - Comprehensive metabolic panel - TSH - Lipid panel  4. Nocturia Gets up to urinate 2-3 times a night with decrease in urine stream force. No daytime frequency or dysuria. Will check PSA and recommend he schedule CPE. - PSA       Vernie Murders, PA  [facility]

## 2016-03-08 DIAGNOSIS — R351 Nocturia: Secondary | ICD-10-CM | POA: Diagnosis not present

## 2016-03-08 DIAGNOSIS — I10 Essential (primary) hypertension: Secondary | ICD-10-CM | POA: Diagnosis not present

## 2016-03-08 DIAGNOSIS — E785 Hyperlipidemia, unspecified: Secondary | ICD-10-CM | POA: Diagnosis not present

## 2016-03-08 DIAGNOSIS — K219 Gastro-esophageal reflux disease without esophagitis: Secondary | ICD-10-CM | POA: Diagnosis not present

## 2016-03-09 LAB — CBC WITH DIFFERENTIAL/PLATELET
BASOS: 0 %
Basophils Absolute: 0 10*3/uL (ref 0.0–0.2)
EOS (ABSOLUTE): 0 10*3/uL (ref 0.0–0.4)
EOS: 1 %
HEMOGLOBIN: 13.8 g/dL (ref 12.6–17.7)
Hematocrit: 41 % (ref 37.5–51.0)
IMMATURE GRANS (ABS): 0 10*3/uL (ref 0.0–0.1)
IMMATURE GRANULOCYTES: 0 %
LYMPHS: 24 %
Lymphocytes Absolute: 1.2 10*3/uL (ref 0.7–3.1)
MCH: 30.3 pg (ref 26.6–33.0)
MCHC: 33.7 g/dL (ref 31.5–35.7)
MCV: 90 fL (ref 79–97)
MONOCYTES: 7 %
Monocytes Absolute: 0.4 10*3/uL (ref 0.1–0.9)
NEUTROS PCT: 68 %
Neutrophils Absolute: 3.3 10*3/uL (ref 1.4–7.0)
PLATELETS: 197 10*3/uL (ref 150–379)
RBC: 4.56 x10E6/uL (ref 4.14–5.80)
RDW: 14.7 % (ref 12.3–15.4)
WBC: 4.9 10*3/uL (ref 3.4–10.8)

## 2016-03-09 LAB — COMPREHENSIVE METABOLIC PANEL
A/G RATIO: 1.8 (ref 1.2–2.2)
ALT: 10 IU/L (ref 0–44)
AST: 12 IU/L (ref 0–40)
Albumin: 4.2 g/dL (ref 3.5–4.8)
Alkaline Phosphatase: 82 IU/L (ref 39–117)
BUN/Creatinine Ratio: 13 (ref 10–24)
BUN: 12 mg/dL (ref 8–27)
Bilirubin Total: 0.9 mg/dL (ref 0.0–1.2)
CALCIUM: 9.4 mg/dL (ref 8.6–10.2)
CO2: 26 mmol/L (ref 18–29)
Chloride: 105 mmol/L (ref 96–106)
Creatinine, Ser: 0.96 mg/dL (ref 0.76–1.27)
GFR, EST AFRICAN AMERICAN: 88 mL/min/{1.73_m2} (ref >59)
GFR, EST NON AFRICAN AMERICAN: 76 mL/min/{1.73_m2} (ref >59)
Globulin, Total: 2.4 g/dL (ref 1.5–4.5)
Glucose: 105 mg/dL — ABNORMAL HIGH (ref 65–99)
Potassium: 4.5 mmol/L (ref 3.5–5.2)
Sodium: 143 mmol/L (ref 134–144)
TOTAL PROTEIN: 6.6 g/dL (ref 6.0–8.5)

## 2016-03-09 LAB — LIPID PANEL
CHOLESTEROL TOTAL: 122 mg/dL (ref 100–199)
Chol/HDL Ratio: 2.8 ratio units (ref 0.0–5.0)
HDL: 44 mg/dL (ref >39)
LDL CALC: 57 mg/dL (ref 0–99)
TRIGLYCERIDES: 106 mg/dL (ref 0–149)
VLDL CHOLESTEROL CAL: 21 mg/dL (ref 5–40)

## 2016-03-09 LAB — PSA: Prostate Specific Ag, Serum: 3.4 ng/mL (ref 0.0–4.0)

## 2016-03-09 LAB — TSH: TSH: 4.4 u[IU]/mL (ref 0.450–4.500)

## 2016-03-17 ENCOUNTER — Telehealth: Payer: Self-pay | Admitting: Cardiovascular Disease

## 2016-03-17 NOTE — Telephone Encounter (Signed)
Pt calling stating he has some indigestion  Stated last night  A bit concerned as to why it is lasting this long Please advise.

## 2016-03-17 NOTE — Telephone Encounter (Signed)
S/w James Mora who reports having indigestion last night and this morning accompanied by chest tightness "above my chest  in my throat and some jaw pain"  Reports he has never had the sx before. He was seen by PCP July 28 for gastroesophageal refulx and is unsure if it is related. He takes omeprazole 40mg  daily. He would like an appt with James Mora. Advised James Mora if he is symptomatic, he needs to be seen emergently in an ER setting. States he would rather see James Mora. Appt scheduled 8/11 @ 2:30pm. Again, advised James Mora to go to ER if he has symptoms. He verbalized understanding and states he will keep 8/11 appt. James Mora had no further questions at this time.

## 2016-03-19 ENCOUNTER — Ambulatory Visit (INDEPENDENT_AMBULATORY_CARE_PROVIDER_SITE_OTHER): Payer: Medicare Other | Admitting: Cardiovascular Disease

## 2016-03-19 ENCOUNTER — Encounter: Payer: Self-pay | Admitting: Emergency Medicine

## 2016-03-19 ENCOUNTER — Encounter: Payer: Self-pay | Admitting: Cardiovascular Disease

## 2016-03-19 ENCOUNTER — Other Ambulatory Visit: Payer: Self-pay

## 2016-03-19 ENCOUNTER — Other Ambulatory Visit
Admission: RE | Admit: 2016-03-19 | Discharge: 2016-03-19 | Disposition: A | Discharge status: Home / Self Care | Payer: Medicare Other | Source: Ambulatory Visit | Attending: Cardiovascular Disease | Admitting: Cardiovascular Disease

## 2016-03-19 ENCOUNTER — Telehealth: Payer: Self-pay | Admitting: Cardiovascular Disease

## 2016-03-19 ENCOUNTER — Inpatient Hospital Stay
Admission: EM | Admit: 2016-03-19 | Discharge: 2016-03-22 | DRG: 282 | Disposition: A | Discharge status: Home / Self Care | Payer: Medicare Other | Attending: Specialist | Admitting: Specialist

## 2016-03-19 ENCOUNTER — Emergency Department: Payer: Medicare Other

## 2016-03-19 VITALS — BP 136/88 | HR 57 | Ht 68.5 in | Wt 184.5 lb

## 2016-03-19 DIAGNOSIS — Z79899 Other long term (current) drug therapy: Secondary | ICD-10-CM | POA: Diagnosis not present

## 2016-03-19 DIAGNOSIS — E785 Hyperlipidemia, unspecified: Secondary | ICD-10-CM | POA: Diagnosis present

## 2016-03-19 DIAGNOSIS — I25111 Atherosclerotic heart disease of native coronary artery with angina pectoris with documented spasm: Secondary | ICD-10-CM | POA: Diagnosis present

## 2016-03-19 DIAGNOSIS — I2510 Atherosclerotic heart disease of native coronary artery without angina pectoris: Secondary | ICD-10-CM | POA: Diagnosis not present

## 2016-03-19 DIAGNOSIS — I351 Nonrheumatic aortic (valve) insufficiency: Secondary | ICD-10-CM | POA: Diagnosis present

## 2016-03-19 DIAGNOSIS — I214 Non-ST elevation (NSTEMI) myocardial infarction: Principal | ICD-10-CM | POA: Diagnosis present

## 2016-03-19 DIAGNOSIS — R6884 Jaw pain: Secondary | ICD-10-CM | POA: Diagnosis not present

## 2016-03-19 DIAGNOSIS — I359 Nonrheumatic aortic valve disorder, unspecified: Secondary | ICD-10-CM | POA: Diagnosis not present

## 2016-03-19 DIAGNOSIS — Z7982 Long term (current) use of aspirin: Secondary | ICD-10-CM | POA: Diagnosis not present

## 2016-03-19 DIAGNOSIS — I10 Essential (primary) hypertension: Secondary | ICD-10-CM | POA: Diagnosis not present

## 2016-03-19 DIAGNOSIS — I252 Old myocardial infarction: Secondary | ICD-10-CM

## 2016-03-19 DIAGNOSIS — R079 Chest pain, unspecified: Secondary | ICD-10-CM

## 2016-03-19 DIAGNOSIS — K219 Gastro-esophageal reflux disease without esophagitis: Secondary | ICD-10-CM | POA: Diagnosis not present

## 2016-03-19 LAB — BASIC METABOLIC PANEL
ANION GAP: 10 (ref 5–15)
BUN: 15 mg/dL (ref 6–20)
CALCIUM: 9.1 mg/dL (ref 8.9–10.3)
CO2: 21 mmol/L — ABNORMAL LOW (ref 22–32)
Chloride: 109 mmol/L (ref 101–111)
Creatinine, Ser: 0.99 mg/dL (ref 0.61–1.24)
Glucose, Bld: 125 mg/dL — ABNORMAL HIGH (ref 65–99)
Potassium: 3.6 mmol/L (ref 3.5–5.1)
SODIUM: 140 mmol/L (ref 135–145)

## 2016-03-19 LAB — PROTIME-INR
INR: 1.07
Prothrombin Time: 13.9 seconds (ref 11.4–15.2)

## 2016-03-19 LAB — TROPONIN I
TROPONIN I: 3.55 ng/mL — AB (ref <0.03)
TROPONIN I: 3.01 ng/mL — AB (ref <0.03)
Troponin I: 2.95 ng/mL (ref <0.03)

## 2016-03-19 LAB — CBC
HCT: 41.9 % (ref 40.0–52.0)
HEMOGLOBIN: 14.5 g/dL (ref 13.0–18.0)
MCH: 31.3 pg (ref 26.0–34.0)
MCHC: 34.6 g/dL (ref 32.0–36.0)
MCV: 90.5 fL (ref 80.0–100.0)
Platelets: 159 10*3/uL (ref 150–440)
RBC: 4.63 MIL/uL (ref 4.40–5.90)
RDW: 14.4 % (ref 11.5–14.5)
WBC: 6.7 10*3/uL (ref 3.8–10.6)

## 2016-03-19 LAB — APTT: APTT: 29 s (ref 24–36)

## 2016-03-19 MED ORDER — AMLODIPINE BESYLATE 5 MG PO TABS
5.0000 mg | ORAL_TABLET | Freq: Every evening | ORAL | Status: DC
  Administered 2016-03-19 – 2016-03-21 (×3): 5 mg via ORAL
  Filled 2016-03-19 (×3): qty 1

## 2016-03-19 MED ORDER — ENALAPRIL MALEATE 10 MG PO TABS
20.0000 mg | ORAL_TABLET | Freq: Every evening | ORAL | Status: DC
  Administered 2016-03-19 – 2016-03-21 (×3): 20 mg via ORAL
  Filled 2016-03-19 (×3): qty 2

## 2016-03-19 MED ORDER — ONDANSETRON HCL 4 MG/2ML IJ SOLN: 4.0000 mg | Freq: Four times a day (QID) | INTRAMUSCULAR | Status: DC | PRN

## 2016-03-19 MED ORDER — ASPIRIN 325 MG PO TABS
325.0000 mg | ORAL_TABLET | Freq: Every day | ORAL | Status: DC
  Administered 2016-03-20: 325 mg via ORAL
  Filled 2016-03-19: qty 1

## 2016-03-19 MED ORDER — NITROGLYCERIN 0.4 MG SL SUBL: 0.4000 mg | SUBLINGUAL_TABLET | SUBLINGUAL | Status: DC | PRN

## 2016-03-19 MED ORDER — ATORVASTATIN CALCIUM 20 MG PO TABS
80.0000 mg | ORAL_TABLET | Freq: Every day | ORAL | Status: DC
  Administered 2016-03-20 – 2016-03-21 (×2): 80 mg via ORAL
  Filled 2016-03-19 (×2): qty 4

## 2016-03-19 MED ORDER — ONDANSETRON HCL 4 MG PO TABS: 4.0000 mg | ORAL_TABLET | Freq: Four times a day (QID) | ORAL | Status: DC | PRN

## 2016-03-19 MED ORDER — HEPARIN (PORCINE) IN NACL 100-0.45 UNIT/ML-% IJ SOLN
1150.0000 [IU]/h | INTRAMUSCULAR | Status: DC
  Administered 2016-03-19: 1000 [IU]/h via INTRAVENOUS
  Administered 2016-03-20 – 2016-03-21 (×2): 1150 [IU]/h via INTRAVENOUS
  Filled 2016-03-19 (×7): qty 250

## 2016-03-19 MED ORDER — ACETAMINOPHEN 650 MG RE SUPP: 650.0000 mg | Freq: Four times a day (QID) | RECTAL | Status: DC | PRN

## 2016-03-19 MED ORDER — ATORVASTATIN CALCIUM 20 MG PO TABS
80.0000 mg | ORAL_TABLET | Freq: Once | ORAL | Status: AC
  Administered 2016-03-19: 80 mg via ORAL
  Filled 2016-03-19: qty 4

## 2016-03-19 MED ORDER — PANTOPRAZOLE SODIUM 40 MG PO TBEC
40.0000 mg | DELAYED_RELEASE_TABLET | Freq: Every day | ORAL | Status: DC
  Administered 2016-03-20 – 2016-03-22 (×3): 40 mg via ORAL
  Filled 2016-03-19 (×3): qty 1

## 2016-03-19 MED ORDER — ACETAMINOPHEN 325 MG PO TABS: 650.0000 mg | ORAL_TABLET | Freq: Four times a day (QID) | ORAL | Status: DC | PRN

## 2016-03-19 MED ORDER — CARVEDILOL 12.5 MG PO TABS
12.5000 mg | ORAL_TABLET | Freq: Two times a day (BID) | ORAL | Status: DC
  Administered 2016-03-19 – 2016-03-22 (×6): 12.5 mg via ORAL
  Filled 2016-03-19 (×6): qty 1

## 2016-03-19 MED ORDER — HEPARIN BOLUS VIA INFUSION
4000.0000 [IU] | Freq: Once | INTRAVENOUS | Status: AC
Start: 2016-03-19 — End: 2016-03-19
  Administered 2016-03-19: 4000 [IU] via INTRAVENOUS
  Filled 2016-03-19: qty 4000

## 2016-03-19 NOTE — H&P (Signed)
[facility] [place] NAME: James Mora    MR#:  [id number]  DATE OF BIRTH:  16-Mar-1936  DATE OF ADMISSION:  03/19/2016  PRIMARY CARE PHYSICIAN: Vernie Murders, PA   REQUESTING/REFERRING PHYSICIAN: Dr. Meade Maw  CHIEF COMPLAINT:   Chief Complaint  Patient presents with  . Abnormal Lab    Troponin over 3    HISTORY OF PRESENT ILLNESS:  James Mora  is a 80 y.o. male with a known history of Coronary artery disease, previous history of MI, hypertension, hyperlipidemia, GERD who presents to the hospital due to chest pain and noted to have an elevated troponin. Patient says he had some chest pain this past Tuesday and Wednesday which was substernal in nature radiating to his jaw and neck. He went to see his cardiologist today but was completely asymptomatic and had blood work drawn and it was noted to be elevated with a troponin greater than 3. He was called at home to come back to the ER for further evaluation. Patient's EKG does not show any acute ST or T-wave changes compared to his previous. He is currently chest pain-free and hemodynamically stable. Hospitalist services were contacted further treatment and evaluation given his non-ST elevation MI.  PAST MEDICAL HISTORY:   Past Medical History:  Diagnosis Date  . Acid reflux   . Aortic insufficiency    Mild to moderate  . CAD (coronary artery disease)    Severe distal LAD disease with left to left collaterals and cardiac cath in 2000. Most recent nuclear stress test in 2008 was normal.  . High blood cholesterol level 2004  . HLD (hyperlipidemia)    mixed  . HTN (hypertension) 2005   unspecified    PAST SURGICAL HISTORY:   Past Surgical History:  Procedure Laterality Date  . CARDIAC CATHETERIZATION  2000   left heart  . COLONOSCOPY  2008   Dr. Allen Norris    SOCIAL HISTORY:   Social History  Substance Use Topics  . Smoking status: Never Smoker  . Smokeless tobacco: Never Used   . Alcohol use No    FAMILY HISTORY:   Family History  Problem Relation Age of Onset  . Family history unknown: Yes    DRUG ALLERGIES:  No Known Allergies  REVIEW OF SYSTEMS:   Review of Systems  Constitutional: Negative for fever and weight loss.  HENT: Negative for congestion, nosebleeds and tinnitus.   Eyes: Negative for blurred vision, double vision and redness.  Respiratory: Negative for cough, hemoptysis and shortness of breath.   Cardiovascular: Negative for chest pain, orthopnea, leg swelling and PND.  Gastrointestinal: Negative for abdominal pain, diarrhea, melena, nausea and vomiting.  Genitourinary: Negative for dysuria, hematuria and urgency.  Musculoskeletal: Negative for falls and joint pain.  Neurological: Negative for dizziness, tingling, sensory change, focal weakness, seizures, weakness and headaches.  Endo/Heme/Allergies: Negative for polydipsia. Does not bruise/bleed easily.  Psychiatric/Behavioral: Negative for depression and memory loss. The patient is not nervous/anxious.     MEDICATIONS AT HOME:   Prior to Admission medications   Medication Sig Start Date End Date Taking? Authorizing Provider  amLODipine (NORVASC) 5 MG tablet Take 5 mg by mouth every evening.   Yes Historical Provider, MD  aspirin 325 MG tablet Take 325 mg by mouth daily.   Yes Historical Provider, MD  atorvastatin (LIPITOR) 20 MG tablet Take 20 mg by mouth daily at 6 PM.   Yes Historical Provider, MD  carvedilol (COREG) 12.5 MG  tablet Take 1 tablet (12.5 mg total) by mouth 2 (two) times daily. 10/20/15  Yes Wellington Hampshire, MD  enalapril (VASOTEC) 20 MG tablet Take 20 mg by mouth every evening.   Yes Historical Provider, MD  nitroGLYCERIN (NITROSTAT) 0.4 MG SL tablet Place 0.4 mg under the tongue every 5 (five) minutes as needed.     Yes Historical Provider, MD  omeprazole (PRILOSEC) 20 MG capsule Take 2 capsules (40 mg total) by mouth daily. 03/05/16  Yes Dennis E Chrismon, PA       VITAL SIGNS:  Blood pressure (!) 155/80, pulse 97, temperature 98.2 F (36.8 C), temperature source Oral, resp. rate 18, SpO2 96 %.  PHYSICAL EXAMINATION:  Physical Exam  GENERAL:  80 y.o.-year-old patient lying in the bed with no acute distress.  EYES: Pupils equal, round, reactive to light and accommodation. No scleral icterus. Extraocular muscles intact.  HEENT: Head atraumatic, normocephalic. Oropharynx and nasopharynx clear. No oropharyngeal erythema, moist oral mucosa  NECK:  Supple, no jugular venous distention. No thyroid enlargement, no tenderness.  LUNGS: Normal breath sounds bilaterally, no wheezing, rales, rhonchi. No use of accessory muscles of respiration.  CARDIOVASCULAR: S1, S2 RRR. No murmurs, rubs, gallops, clicks.  ABDOMEN: Soft, nontender, nondistended. Bowel sounds present. No organomegaly or mass.  EXTREMITIES: No pedal edema, cyanosis, or clubbing. + 2 pedal & radial pulses b/l.   NEUROLOGIC: Cranial nerves II through XII are intact. No focal Motor or sensory deficits appreciated b/l PSYCHIATRIC: The patient is alert and oriented x 3. Good affect.  SKIN: No obvious rash, lesion, or ulcer.   LABORATORY PANEL:   CBC  Recent Labs Lab 03/19/16 1720  WBC 6.7  HGB 14.5  HCT 41.9  PLT 159   ------------------------------------------------------------------------------------------------------------------  Chemistries   Recent Labs Lab 03/19/16 1720  NA 140  K 3.6  CL 109  CO2 21*  GLUCOSE 125*  BUN 15  CREATININE 0.99  CALCIUM 9.1   ------------------------------------------------------------------------------------------------------------------  Cardiac Enzymes  Recent Labs Lab 03/19/16 1720  TROPONINI 3.01*   ------------------------------------------------------------------------------------------------------------------  RADIOLOGY:  Dg Chest 2 View  Result Date: 03/19/2016 CLINICAL DATA:  Mid sternal chest pain for 3 days. EXAM:  CHEST  2 VIEW COMPARISON:  None. FINDINGS: Heart size is upper limits of normal. The lungs are clear without airspace disease or pulmonary edema. No large pleural effusions. Degenerative changes in the thoracic spine. IMPRESSION: No active cardiopulmonary disease. Electronically Signed   By: Markus Daft M.D.   On: 03/19/2016 17:27     IMPRESSION AND PLAN:   80 year old male with past medical history of hypertension, hyperlipidemia, GERD, previous history of MI, coronary artery disease who presents to the hospital complaining of chest pain 2 days ago and ruled in for a non-ST elevation MI.  1. Non-ST elevation MI-patient rules out by cardiac markers given his elevated troponin at 3. -His MI was likely 2 days ago. He is currently asymptomatic. -Continue aspirin, heparin nomogram, Coreg, atorvastatin, lisinopril -I will check a two-dimensional echocardiogram, get a cardiology consult. Keep on telemetry, cycle his cardiac markers.  2. Essential hypertension-continue Coreg, enalapril, Norvasc.  3. Hyperlipidemia-continue atorvastatin.  4. GERD-continue Protonix.    All the records are reviewed and case discussed with ED provider. Management plans discussed with the patient, family and they are in agreement.  CODE STATUS: Full Code  TOTAL TIME TAKING CARE OF THIS PATIENT: 45 minutes.    Henreitta Leber M.D on 03/19/2016 at 8:56 PM  Between 7am to 6pm - Pager -  [phone]  After 6pm go to [email or web address] - password [email or web address] [facility]  [facility]  Office  [phone]  CC: Primary care physician; Vernie Murders, PA

## 2016-03-19 NOTE — Progress Notes (Signed)
Cardiology Office Note   Date:  03/19/2016   ID:  James Mora, DOB February 23, 1936, MRN [id number]  PCP:  Vernie Murders, PA  Cardiologist:   [name], MD   Chief Complaint  Patient presents with  . Other    Pt. c/o jaw pain, indigestion with chest fullness. Meds reviewed by the patient's med list.       History of Present Illness: James Mora is a 80 y.o. male who presents for a follow-up visit. He has known history of coronary artery disease without revascularization being managed medically and mild to moderate aortic insufficiency. His last echo in 8/12 showed mild to moderate aortic insufficiency and preserved EF. His last myoview in 2008 was low risk. Cardiac catheterization in 2000 showed severe distal LAD disease with left to left collaterals which has been managed medically.  His blood pressure became controlled after I switched him from atenolol to carvedilol. He is known to have GERD. Monday night, he started having substernal chest pain described as mild tightness with indigestion. He had some jaw discomfort later. This persisted Tuesday and Wednesday. He hasn't had any symptoms since yesterday. The pain did not worsen with physical activities and was almost continuous. Mild dyspnea but no other associated symptoms.    Past Medical History:  Diagnosis Date  . Acid reflux   . Aortic insufficiency    Mild to moderate  . CAD (coronary artery disease)    Severe distal LAD disease with left to left collaterals and cardiac cath in 2000. Most recent nuclear stress test in 2008 was normal.  . High blood cholesterol level 2004  . HLD (hyperlipidemia)    mixed  . HTN (hypertension) 2005   unspecified    Past Surgical History:  Procedure Laterality Date  . CARDIAC CATHETERIZATION  2000   left heart  . COLONOSCOPY  2008   Dr. Allen Norris     Current Outpatient Prescriptions  Medication Sig Dispense Refill  . amLODipine (NORVASC) 5 MG tablet take 1 tablet by mouth  once daily 30 tablet 6  . aspirin 325 MG tablet Take 325 mg by mouth daily.    Marland Kitchen atorvastatin (LIPITOR) 20 MG tablet Take 1 tablet (20 mg total) by mouth daily. 30 tablet 6  . carvedilol (COREG) 12.5 MG tablet Take 1 tablet (12.5 mg total) by mouth 2 (two) times daily. 60 tablet 5  . enalapril (VASOTEC) 20 MG tablet take 1 tablet by mouth once daily 30 tablet 3  . nitroGLYCERIN (NITROSTAT) 0.4 MG SL tablet Place 0.4 mg under the tongue every 5 (five) minutes as needed.      Marland Kitchen omeprazole (PRILOSEC) 20 MG capsule Take 2 capsules (40 mg total) by mouth daily. 60 capsule 6   No current facility-administered medications for this visit.     Allergies:   Review of patient's allergies indicates no known allergies.    Social History:  The patient  reports that he has never smoked. He has never used smokeless tobacco. He reports that he does not drink alcohol or use drugs.   Family History:  The patient's Family history is unknown by patient.      PHYSICAL EXAM: VS:  BP 136/88 (BP Location: Left Arm, Patient Position: Sitting, Cuff Size: Normal)   Pulse (!) 57   Ht 5' 8.5" (1.74 m)   Wt 184 lb 8 oz (83.7 kg)   BMI 27.65 kg/m  , BMI Body mass index is 27.65 kg/m. GEN: Well nourished, well  developed, in no acute distress HEENT: normal Neck: no JVD, carotid bruits, or masses Cardiac: RRR; no murmurs, rubs, or gallops,no edema  Respiratory:  clear to auscultation bilaterally, normal work of breathing GI: soft, nontender, nondistended, + BS MS: no deformity or atrophy Skin: warm and dry, no rash Neuro:  Strength and sensation are intact Psych: euthymic mood, full affect   EKG:  EKG is ordered today. The ekg ordered today demonstrates : sinus bradycardia with right bundle branch block. Minimal LVH. No significant change from recent EKG   Recent Labs: 03/08/2016: ALT 10; BUN 12; Creatinine, Ser 0.96; Platelets 197; Potassium 4.5; Sodium 143; TSH 4.400    Lipid Panel    Component  Value Date/Time   CHOL 122 03/08/2016 1029   TRIG 106 03/08/2016 1029   HDL 44 03/08/2016 1029   CHOLHDL 2.8 03/08/2016 1029   CHOLHDL 3.3 Ratio 12/20/2008 2241   VLDL 14 12/20/2008 2241   LDLCALC 57 03/08/2016 1029      Wt Readings from Last 3 Encounters:  03/19/16 184 lb 8 oz (83.7 kg)  03/05/16 186 lb (84.4 kg)  10/06/15 192 lb 8 oz (87.3 kg)         ASSESSMENT AND PLAN:  1.  Coronary artery disease involving native coronary arteries with atypical chest pain: Some of the features of chest pain are concerning but also could be related to esophageal spasm with known history of GERD. Interestingly, his symptoms did not worsen with physical activities. It appears that he hasn't been doing much physical activity since he started having the symptoms. His EKG is not different from his prior EKGs. I requested stat troponin. If he had a cardiac event on Monday, his troponin should be started elevated today. His troponin comes back negative, I plan on proceeding with a treadmill nuclear stress test. Given that his EKG is chronically abnormal, a treadmill stress test alone is not sufficient.   2. Aortic valve disorder: previous echocardiogram showed mild to moderate aortic insufficiency. He has no cardiac murmurs.  3. Essential hypertension:  Blood pressure is well controlled on current medications.  4. Hyperlipidemia: most recent lipid profile in August 2016 showed an LDL of 41.   Continue treatment with atorvastatin.   Disposition:   FU with me in after testing  Signed,  [name], MD  03/19/2016 2:28 PM    [place]

## 2016-03-19 NOTE — ED Notes (Signed)
Patient transported to X-ray 

## 2016-03-19 NOTE — Consult Note (Signed)
ANTICOAGULATION CONSULT NOTE - Initial Consult  Pharmacy Consult for heparin drip Indication: chest pain/ACS  No Known Allergies  Patient Measurements:   Heparin Dosing Weight: 83.7kg  Vital Signs: Temp: 98.2 F (36.8 C) (08/11 1711) Temp Source: Oral (08/11 1711) BP: 150/89 (08/11 1802) Pulse Rate: 71 (08/11 1802)  Labs:  Recent Labs  03/19/16 1517 03/19/16 1720  HGB  --  14.5  HCT  --  41.9  PLT  --  159  CREATININE  --  0.99  TROPONINI 3.55* 3.01*    Estimated Creatinine Clearance: 66.5 mL/min (by C-G formula based on SCr of 0.99 mg/dL).   Medical History: Past Medical History:  Diagnosis Date  . Acid reflux   . Aortic insufficiency    Mild to moderate  . CAD (coronary artery disease)    Severe distal LAD disease with left to left collaterals and cardiac cath in 2000. Most recent nuclear stress test in 2008 was normal.  . High blood cholesterol level 2004  . HLD (hyperlipidemia)    mixed  . HTN (hypertension) 2005   unspecified    Medications:  Scheduled:  . heparin  4,000 Units Intravenous Once    Assessment: Pt is a 80 year old male with chest pain and elevated troponin. Pharmacy consulted to dose heparin drip. No anticoagulants see on in patient med rec. Baseline labs ordered.   Goal of Therapy:  Heparin level 0.3-0.7 units/ml Monitor platelets by anticoagulation protocol: Yes   Plan:  Give 4000 units bolus x 1 Start heparin infusion at 1000 units/hr Check anti-Xa level in 8 hours and daily while on heparin Continue to monitor H&H and platelets  Kathlyne Loud D Sirius Woodford 03/19/2016,6:59 PM

## 2016-03-19 NOTE — ED Provider Notes (Signed)
Time Seen: Approximately 1733 I have reviewed the triage notes  Chief Complaint: Abnormal Lab (Troponin over 3)   History of Present Illness: James Mora is a 80 y.o. male who presents after referral from his cardiologist Dr. Fletcher Anon. Patient apparently had some chest discomfort that started on Monday and he thought it was acid indigestion. Patient states his pain persisted into the next morning and seemed to wax and wane in its intensity over the next 2 days. He denies any chest pain at present states he's actually been pain-free since yesterday evening. He denies any associated symptoms such as nausea, vomiting, shortness of breath. He states the pain radiated up into his neck and his jaw. He has taken his aspirin today and again is currently pain-free. Patient saw his cardiologist and had an EKG in the office and he states that the EKG did not show any changes. The patient states that he had blood work drawn and was called back later with an elevated troponin of greater than 3. (Records show 3.55). Patient does have a previous history of a myocardial infarction and cardiac catheterization at that time was negative.   Past Medical History:  Diagnosis Date  . Acid reflux   . Aortic insufficiency    Mild to moderate  . CAD (coronary artery disease)    Severe distal LAD disease with left to left collaterals and cardiac cath in 2000. Most recent nuclear stress test in 2008 was normal.  . High blood cholesterol level 2004  . HLD (hyperlipidemia)    mixed  . HTN (hypertension) 2005   unspecified    Patient Active Problem List   Diagnosis Date Noted  . HTN (hypertension)   . High blood cholesterol level   . Acid reflux   . Hyperlipidemia 12/27/2008  . Essential hypertension 12/27/2008  . Coronary atherosclerosis 12/27/2008  . Aortic valve disorder 12/27/2008    Past Surgical History:  Procedure Laterality Date  . CARDIAC CATHETERIZATION  2000   left heart  . COLONOSCOPY  2008   Dr. Allen Norris    Past Surgical History:  Procedure Laterality Date  . CARDIAC CATHETERIZATION  2000   left heart  . COLONOSCOPY  2008   Dr. Allen Norris    Current Outpatient Rx  . Order #: [id number] Class: Normal  . Order #: [id number] Class: Normal  . Order #: [id number] Class: Normal  . Order #: [id number] Class: Normal  . Order #: [id number] Class: Historical Med  . Order #: [id number] Class: Normal    Allergies:  Review of patient's allergies indicates no known allergies.  Family History: Family History  Problem Relation Age of Onset  . Family history unknown: Yes    Social History: Social History  Substance Use Topics  . Smoking status: Never Smoker  . Smokeless tobacco: Never Used  . Alcohol use No     Review of Systems:   10 point review of systems was performed and was otherwise negative:  Constitutional: No fever Eyes: No visual disturbances ENT: No sore throat, ear pain Cardiac: NoCurrent chest pain Respiratory: No shortness of breath, wheezing, or stridor Abdomen: No abdominal pain, no vomiting, No diarrhea Endocrine: No weight loss, No night sweats Extremities: No peripheral edema, cyanosis Skin: No rashes, easy bruising Neurologic: No focal weakness, trouble with speech or swollowing Urologic: No dysuria, Hematuria, or urinary frequency   Physical Exam:  ED Triage Vitals [03/19/16 1711]  Enc Vitals Group     BP (!) 158/81     Pulse Rate 73  Resp 17     Temp 98.2 F (36.8 C)     Temp Source Oral     SpO2 95 %     Weight      Height      Head Circumference      Peak Flow      Pain Score      Pain Loc      Pain Edu?      Excl. in [place]?     General: Awake , Alert , and Oriented times 3; GCS 15 Head: Normal cephalic , atraumatic Eyes: Pupils equal , round, reactive to light Nose/Throat: No nasal drainage, patent upper airway without erythema or exudate.  Neck: Supple, Full range of motion, No anterior adenopathy or palpable thyroid masses Lungs: Clear  to ascultation without wheezes , rhonchi, or rales Heart: Regular rate, regular rhythm without murmurs , gallops , or rubs Abdomen: Soft, non tender without rebound, guarding , or rigidity; bowel sounds positive and symmetric in all 4 quadrants. No organomegaly .        Extremities: 2 plus symmetric pulses. No edema, clubbing or cyanosis Neurologic: normal ambulation, Motor symmetric without deficits, sensory intact Skin: warm, dry, no rashes   Labs:   All laboratory work was reviewed including any pertinent negatives or positives listed below:  Labs Reviewed  BASIC METABOLIC PANEL - Abnormal; Notable for the following:       Result Value   CO2 21 (*)    Glucose, Bld 125 (*)    All other components within normal limits  TROPONIN I - Abnormal; Notable for the following:    Troponin I 3.01 (*)    All other components within normal limits  CBC  APTT  PROTIME-INR  HEPARIN LEVEL (UNFRACTIONATED)    EKG: * ED ECG REPORT I, Daymon Larsen, the attending physician, personally viewed and interpreted this ECG.  Date: 03/19/2016 EKG Time: 1716 Rate: *71 Rhythm: normal sinus rhythm with PACs QRS Axis: normal Intervals: Right bundle-branch block ST/T Wave abnormalities: normal Conduction Disturbances: none Narrative Interpretation: unremarkable Premature atrial contractions   Radiology:   CLINICAL DATA:  Mid sternal chest pain for 3 days.  EXAM: CHEST  2 VIEW  COMPARISON:  None.  FINDINGS: Heart size is upper limits of normal. The lungs are clear without airspace disease or pulmonary edema. No large pleural effusions. Degenerative changes in the thoracic spine.  IMPRESSION: No active cardiopulmonary disease.   Electronically Signed   By: Markus Daft M.D.   On: 03/19/2016 17:27   I personally reviewed the radiologic studies    ED Course:  Patient's had aspirin this morning and after discussion with the on-call cardiologist we started heparin. On call  cardiology requested that the patient be admitted and they will consult. Patient's currently pain-free. Resisted he's had a non-ST elevation myocardial infarction. Serial troponins will likely be obtained and a troponin here was approximately 3.0    Clinical Course     Assessment: * Non-ST elevation myocardial infarction  Final Clinical Impression:   Final diagnoses:  Non-ST elevation (NSTEMI) myocardial infarction [facility])     Plan: * Inpatient management           Daymon Larsen, MD 03/19/16 1916

## 2016-03-19 NOTE — Telephone Encounter (Signed)
Notified Dr. Fletcher Anon of pt's elevated troponin (3.55) . Pt to proceed to ER.  Notified pt to proceed to [facility] ER now. Treadmill myoview will be cancelled. He verbalized understanding and will go there now.  Notified Vaughan Basta, Camera operator, of critical troponin and pt to arrive shortly.  Notified Olen Pel, on-call for [facility], that pt's myoview cancelled.

## 2016-03-19 NOTE — ED Triage Notes (Signed)
Patient went to the doctor recently for chest pain and doctor called patient to come to the ED because his troponin was greater than 3.  Patient states he is feeling better today.  Patient is in no obvious distress at this time.  Ambulatory to triage.

## 2016-03-19 NOTE — Patient Instructions (Addendum)
Medication Instructions:  Your physician has recommended you make the following change in your medication:  DECREASE aspirin to 81mg  once daily   Labwork: Troponin at [facility] lab now. We will call you with the results. If it is negative, you will proceed with Monday's treadmill myoview  Testing/Procedures: Your physician has requested that you have a lexiscan myoview. For further information please visit [email or web address]. Please follow instruction sheet, as given.  Noonday  Your caregiver has ordered a Stress Test with nuclear imaging. The purpose of this test is to evaluate the blood supply to your heart muscle. This procedure is referred to as a "Non-Invasive Stress Test." This is because other than having an IV started in your vein, nothing is inserted or "invades" your body. Cardiac stress tests are done to find areas of poor blood flow to the heart by determining the extent of coronary artery disease (CAD). Some patients exercise on a treadmill, which naturally increases the blood flow to your heart, while others who are  unable to walk on a treadmill due to physical limitations have a pharmacologic/chemical stress agent called Lexiscan . This medicine will mimic walking on a treadmill by temporarily increasing your coronary blood flow.   Please note: these test may take anywhere between 2-4 hours to complete  PLEASE REPORT TO [place] AT THE FIRST DESK WILL DIRECT YOU WHERE TO GO  Date of Procedure: Monday, August 14 (If your troponin is negative) Arrival Time for Procedure:  8:45  Instructions regarding medication:   _xx___:  Hold coreg the morning of procedure   PLEASE NOTIFY THE OFFICE AT LEAST 24 HOURS IN ADVANCE IF YOU ARE UNABLE TO KEEP YOUR APPOINTMENT.  [phone] AND  PLEASE NOTIFY NUCLEAR MEDICINE AT [facility] AT LEAST 24 HOURS IN ADVANCE IF YOU ARE UNABLE TO KEEP YOUR APPOINTMENT. [phone]  How to prepare for your  Myoview test:  1. Do not eat or drink after midnight 2. No caffeine for 24 hours prior to test 3. No smoking 24 hours prior to test. 4. Your medication may be taken with water.  If your doctor stopped a medication because of this test, do not take that medication. 5. Ladies, please do not wear dresses.  Skirts or pants are appropriate. Please wear a short sleeve shirt. 6. No perfume, cologne or lotion. 7. Wear comfortable walking shoes. No heels!            Follow-Up: Your physician recommends that you schedule a follow-up appointment with Dr. Fletcher Anon after treadmill    Any Other Special Instructions Will Be Listed Below (If Applicable).     If you need a refill on your cardiac medications before your next appointment, please call your pharmacy.  Cardiac Nuclear Scanning A cardiac nuclear scan is used to check your heart for problems, such as the following:  A portion of the heart is not getting enough blood.  Part of the heart muscle has died, which happens with a heart attack.  The heart wall is not working normally.  In this test, a radioactive dye (tracer) is injected into your bloodstream. After the tracer has traveled to your heart, a scanning device is used to measure how much of the tracer is absorbed by or distributed to various areas of your heart. LET [facility] CARE PROVIDER KNOW ABOUT:  Any allergies you have.  All medicines you are taking, including vitamins, herbs, eye drops, creams, and over-the-counter medicines.  Previous problems you  or members of your family have had with the use of anesthetics.  Any blood disorders you have.  Previous surgeries you have had.  Medical conditions you have.  RISKS AND COMPLICATIONS Generally, this is a safe procedure. However, as with any procedure, problems can occur. Possible problems include:   Serious chest pain.  Rapid heartbeat.  Sensation of warmth in your chest. This usually passes quickly. BEFORE  THE PROCEDURE Ask your health care provider about changing or stopping your regular medicines. PROCEDURE This procedure is usually done at a hospital and takes 2-4 hours.  An IV tube is inserted into one of your veins.  Your health care provider will inject a small amount of radioactive tracer through the tube.  You will then wait for 20-40 minutes while the tracer travels through your bloodstream.  You will lie down on an exam table so images of your heart can be taken. Images will be taken for about 15-20 minutes.  You will exercise on a treadmill or stationary bike. While you exercise, your heart activity will be monitored with an electrocardiogram (ECG), and your blood pressure will be checked.  If you are unable to exercise, you may be given a medicine to make your heart beat faster.  When blood flow to your heart has peaked, tracer will again be injected through the IV tube.  After 20-40 minutes, you will get back on the exam table and have more images taken of your heart.  When the procedure is over, your IV tube will be removed. AFTER THE PROCEDURE  You will likely be able to leave shortly after the test. Unless your health care provider tells you otherwise, you may return to your normal schedule, including diet, activities, and medicines.  Make sure you find out how and when you will get your test results.   This information is not intended to replace advice given to you by your health care provider. Make sure you discuss any questions you have with your health care provider.   Document Released: 08/20/2004 Document Revised: 07/31/2013 Document Reviewed: 07/04/2013 Elsevier Interactive Patient Education [employer].

## 2016-03-20 ENCOUNTER — Inpatient Hospital Stay (HOSPITAL_COMMUNITY)
Admit: 2016-03-20 | Discharge: 2016-03-20 | Disposition: A | Discharge status: Home / Self Care | Payer: Medicare Other | Attending: Specialist | Admitting: Specialist

## 2016-03-20 DIAGNOSIS — I214 Non-ST elevation (NSTEMI) myocardial infarction: Principal | ICD-10-CM

## 2016-03-20 DIAGNOSIS — R079 Chest pain, unspecified: Secondary | ICD-10-CM

## 2016-03-20 LAB — TROPONIN I
Troponin I: 2.81 ng/mL (ref <0.03)
Troponin I: 2.53 ng/mL (ref <0.03)

## 2016-03-20 LAB — ECHOCARDIOGRAM COMPLETE
HEIGHTINCHES: 68.5 in
WEIGHTICAEL: 2952 [oz_av]

## 2016-03-20 LAB — CBC
HCT: 37.6 % — ABNORMAL LOW (ref 40.0–52.0)
Hemoglobin: 13.5 g/dL (ref 13.0–18.0)
MCH: 32.3 pg (ref 26.0–34.0)
MCHC: 35.9 g/dL (ref 32.0–36.0)
MCV: 89.9 fL (ref 80.0–100.0)
PLATELETS: 173 10*3/uL (ref 150–440)
RBC: 4.18 MIL/uL — AB (ref 4.40–5.90)
RDW: 14.4 % (ref 11.5–14.5)
WBC: 4.9 10*3/uL (ref 3.8–10.6)

## 2016-03-20 LAB — HEPARIN LEVEL (UNFRACTIONATED)
HEPARIN UNFRACTIONATED: 0.42 [IU]/mL (ref 0.30–0.70)
HEPARIN UNFRACTIONATED: 0.47 [IU]/mL (ref 0.30–0.70)
Heparin Unfractionated: 0.28 IU/mL — ABNORMAL LOW (ref 0.30–0.70)

## 2016-03-20 MED ORDER — ASPIRIN EC 81 MG PO TBEC
81.0000 mg | DELAYED_RELEASE_TABLET | Freq: Every day | ORAL | Status: DC
  Administered 2016-03-21 – 2016-03-22 (×3): 81 mg via ORAL
  Filled 2016-03-20 (×3): qty 1

## 2016-03-20 MED ORDER — HEPARIN BOLUS VIA INFUSION
1200.0000 [IU] | Freq: Once | INTRAVENOUS | Status: AC
  Administered 2016-03-20: 1200 [IU] via INTRAVENOUS
  Filled 2016-03-20: qty 1200

## 2016-03-20 NOTE — Consult Note (Addendum)
ANTICOAGULATION CONSULT NOTE - Initial Consult  Pharmacy Consult for heparin drip Indication: chest pain/ACS  No Known Allergies  Patient Measurements:   Heparin Dosing Weight: 83.7kg  Vital Signs: Temp: 98.3 F (36.8 C) (08/12 2009) Temp Source: Oral (08/12 2009) BP: 134/64 (08/12 2009) Pulse Rate: 61 (08/12 2009)  Labs:  Recent Labs  03/19/16 1720 03/19/16 2242 03/20/16 0313 03/20/16 0718 03/20/16 1050 03/20/16 1955  HGB 14.5  --   --   --  13.5  --   HCT 41.9  --   --   --  37.6*  --   PLT 159  --   --   --  173  --   APTT 29  --   --   --   --   --   LABPROT 13.9  --   --   --   --   --   INR 1.07  --   --   --   --   --   HEPARINUNFRC  --   --  0.42  --  0.28* 0.47  CREATININE 0.99  --   --   --   --   --   TROPONINI 3.01* 2.95* 2.81* 2.53*  --   --     Estimated Creatinine Clearance: 66.5 mL/min (by C-G formula based on SCr of 0.99 mg/dL).   Medical History: Past Medical History:  Diagnosis Date  . Acid reflux   . Aortic insufficiency    Mild to moderate  . CAD (coronary artery disease)    Severe distal LAD disease with left to left collaterals and cardiac cath in 2000. Most recent nuclear stress test in 2008 was normal.  . High blood cholesterol level 2004  . HLD (hyperlipidemia)    mixed  . HTN (hypertension) 2005   unspecified    Medications:  Scheduled:  . amLODipine  5 mg Oral QPM  . [START ON 03/21/2016] aspirin EC  81 mg Oral Daily  . atorvastatin  80 mg Oral q1800  . carvedilol  12.5 mg Oral BID  . enalapril  20 mg Oral QPM  . pantoprazole  40 mg Oral Daily    Assessment: Pt is a 81 year old male with chest pain and elevated troponin. Pharmacy consulted to dose heparin drip. No anticoagulants see on in patient med rec. Baseline labs ordered.   Goal of Therapy:  Heparin level 0.3-0.7 units/ml Monitor platelets by anticoagulation protocol: Yes   Plan:  HL= 0.47 @ 1955 which is WNL. Will continue heparin infusion at current rate of  1150 units/hr. Will check confirmatory HL in 8 hours.  8/13 AM heparin level 0.61. Continue current regimen. Recheck CBC and heparin level tomorrow AM.  Lenis Noon, PharmD 03/20/2016,8:43 PM

## 2016-03-20 NOTE — Progress Notes (Signed)
Handoff from [employer]. Patient resting quietly in bed with wife at bedside. No distress. No complaints at this time. Will continue to monitor.

## 2016-03-20 NOTE — Progress Notes (Signed)
*  PRELIMINARY RESULTS* Echocardiogram 2D Echocardiogram has been performed.  James Mora 03/20/2016, 8:32 AM

## 2016-03-20 NOTE — Consult Note (Signed)
Henrietta provided pastoral care to PT & wife at bedside.  James Mora Taryne Kiger/([phone]) [phone]

## 2016-03-20 NOTE — Consult Note (Signed)
ANTICOAGULATION CONSULT NOTE - Initial Consult  Pharmacy Consult for heparin drip Indication: chest pain/ACS  No Known Allergies  Patient Measurements:   Heparin Dosing Weight: 83.7kg  Vital Signs: Temp: 98.1 F (36.7 C) (08/11 2146) Temp Source: Oral (08/11 2146) BP: 161/76 (08/11 2146) Pulse Rate: 67 (08/11 2146)  Labs:  Recent Labs  03/19/16 1720 03/19/16 2242 03/20/16 0313  HGB 14.5  --   --   HCT 41.9  --   --   PLT 159  --   --   APTT 29  --   --   LABPROT 13.9  --   --   INR 1.07  --   --   HEPARINUNFRC  --   --  0.42  CREATININE 0.99  --   --   TROPONINI 3.01* 2.95* 2.81*    Estimated Creatinine Clearance: 66.5 mL/min (by C-G formula based on SCr of 0.99 mg/dL).   Medical History: Past Medical History:  Diagnosis Date  . Acid reflux   . Aortic insufficiency    Mild to moderate  . CAD (coronary artery disease)    Severe distal LAD disease with left to left collaterals and cardiac cath in 2000. Most recent nuclear stress test in 2008 was normal.  . High blood cholesterol level 2004  . HLD (hyperlipidemia)    mixed  . HTN (hypertension) 2005   unspecified    Medications:  Scheduled:  . amLODipine  5 mg Oral QPM  . aspirin  325 mg Oral Daily  . atorvastatin  80 mg Oral q1800  . carvedilol  12.5 mg Oral BID  . enalapril  20 mg Oral QPM  . pantoprazole  40 mg Oral Daily    Assessment: Pt is a 80 year old male with chest pain and elevated troponin. Pharmacy consulted to dose heparin drip. No anticoagulants see on in patient med rec. Baseline labs ordered.   Goal of Therapy:  Heparin level 0.3-0.7 units/ml Monitor platelets by anticoagulation protocol: Yes   Plan:  Give 4000 units bolus x 1 Start heparin infusion at 1000 units/hr Check anti-Xa level in 8 hours and daily while on heparin Continue to monitor H&H and platelets   8/12 03:00 heparin level 0.42. Recheck in 8 hours to confirm.  Jaggar Benko S 03/20/2016,4:10 AM

## 2016-03-20 NOTE — Consult Note (Signed)
ANTICOAGULATION CONSULT NOTE - Initial Consult  Pharmacy Consult for heparin drip Indication: chest pain/ACS  No Known Allergies  Patient Measurements:   Heparin Dosing Weight: 83.7kg  Vital Signs: Temp: 98.1 F (36.7 C) (08/12 1123) Temp Source: Oral (08/12 1123) BP: 147/75 (08/12 1123) Pulse Rate: 52 (08/12 1123)  Labs:  Recent Labs  03/19/16 1720 03/19/16 2242 03/20/16 0313 03/20/16 0718 03/20/16 1050  HGB 14.5  --   --   --  13.5  HCT 41.9  --   --   --  37.6*  PLT 159  --   --   --  173  APTT 29  --   --   --   --   LABPROT 13.9  --   --   --   --   INR 1.07  --   --   --   --   HEPARINUNFRC  --   --  0.42  --  0.28*  CREATININE 0.99  --   --   --   --   TROPONINI 3.01* 2.95* 2.81* 2.53*  --     Estimated Creatinine Clearance: 66.5 mL/min (by C-G formula based on SCr of 0.99 mg/dL).   Medical History: Past Medical History:  Diagnosis Date  . Acid reflux   . Aortic insufficiency    Mild to moderate  . CAD (coronary artery disease)    Severe distal LAD disease with left to left collaterals and cardiac cath in 2000. Most recent nuclear stress test in 2008 was normal.  . High blood cholesterol level 2004  . HLD (hyperlipidemia)    mixed  . HTN (hypertension) 2005   unspecified    Medications:  Scheduled:  . amLODipine  5 mg Oral QPM  . [START ON 03/21/2016] aspirin EC  81 mg Oral Daily  . atorvastatin  80 mg Oral q1800  . carvedilol  12.5 mg Oral BID  . enalapril  20 mg Oral QPM  . heparin  1,200 Units Intravenous Once  . pantoprazole  40 mg Oral Daily    Assessment: Pt is a 80 year old male with chest pain and elevated troponin. Pharmacy consulted to dose heparin drip. No anticoagulants see on in patient med rec. Baseline labs ordered.   Goal of Therapy:  Heparin level 0.3-0.7 units/ml Monitor platelets by anticoagulation protocol: Yes   Plan:  HL= 0.28. Will bolus heparin 1200 units iv once and increase infusion to 1150 units/hr. Will  recheck a HL in 8 hours.   Ulice Dash D 03/20/2016,11:34 AM

## 2016-03-20 NOTE — Care Management Important Message (Signed)
Important Message  Patient Details  Name: James Mora MRN: [id number] Date of Birth: 04/11/1936   Medicare Important Message Given:  Yes    Keneisha Heckart A, RN 03/20/2016, 3:51 PM

## 2016-03-20 NOTE — Consult Note (Signed)
CARDIOLOGY CONSULT NOTE  Patient ID: James Mora, MRN: [id number], DOB/AGE: 1936/06/09 80 y.o. Admit date: 03/19/2016 Date of Consult: 03/20/2016  Primary Physician: Vernie Murders, PA Primary Cardiologist: Dr Fletcher Anon Referring Physician: Dr Verdell Carmine  Chief Complaint: Chest pain Reason for Consultation: NSTEMI  HPI: 80 year old gentleman with known CAD. Previous cardiac catheterization demonstrated severe distal LAD stenosis with left to left collaterals. The patient is been managed medically. He was seen by Dr. Fletcher Anon yesterday in the office and complained of chest pain earlier in the week. He had persistent symptoms described as a tightness in the mid chest that felt like indigestion. He also admitted to jaw discomfort. When he was evaluated yesterday he was chest pain-free. A troponin was drawn and was elevated. The patient was advised to go to the emergency department for evaluation and treatment of non-ST elevation infarction.  Since arrival here the patient is had no recurrence of chest pain. He denies dyspnea, nausea, vomiting, or diaphoresis. States that his symptoms feel unlike those of his heart attack that occurred in 2000. He's had no recurrence of anginal symptoms until this past week. He has been moderately active with no exertional chest discomfort.  Medical History:  Past Medical History:  Diagnosis Date  . Acid reflux   . Aortic insufficiency    Mild to moderate  . CAD (coronary artery disease)    Severe distal LAD disease with left to left collaterals and cardiac cath in 2000. Most recent nuclear stress test in 2008 was normal.  . High blood cholesterol level 2004  . HLD (hyperlipidemia)    mixed  . HTN (hypertension) 2005   unspecified      Surgical History:  Past Surgical History:  Procedure Laterality Date  . CARDIAC CATHETERIZATION  2000   left heart  . COLONOSCOPY  2008   Dr. Allen Norris     Home Meds: Prior to Admission medications   Medication Sig Start  Date End Date Taking? Authorizing Provider  amLODipine (NORVASC) 5 MG tablet Take 5 mg by mouth every evening.   Yes Historical Provider, MD  aspirin 325 MG tablet Take 325 mg by mouth daily.   Yes Historical Provider, MD  atorvastatin (LIPITOR) 20 MG tablet Take 20 mg by mouth daily at 6 PM.   Yes Historical Provider, MD  carvedilol (COREG) 12.5 MG tablet Take 1 tablet (12.5 mg total) by mouth 2 (two) times daily. 10/20/15  Yes Wellington Hampshire, MD  enalapril (VASOTEC) 20 MG tablet Take 20 mg by mouth every evening.   Yes Historical Provider, MD  nitroGLYCERIN (NITROSTAT) 0.4 MG SL tablet Place 0.4 mg under the tongue every 5 (five) minutes as needed.     Yes Historical Provider, MD  omeprazole (PRILOSEC) 20 MG capsule Take 2 capsules (40 mg total) by mouth daily. 03/05/16  Yes Vickki Muff Chrismon, PA    Inpatient Medications:  . amLODipine  5 mg Oral QPM  . [START ON 03/21/2016] aspirin EC  81 mg Oral Daily  . atorvastatin  80 mg Oral q1800  . carvedilol  12.5 mg Oral BID  . enalapril  20 mg Oral QPM  . pantoprazole  40 mg Oral Daily   . heparin 1,150 Units/hr (03/20/16 1157)    Allergies: No Known Allergies  Social History   Social History  . Marital status: Married    Spouse name: N/A  . Number of children: N/A  . Years of education: N/A   Occupational History  . Not on  file.   Social History Main Topics  . Smoking status: Never Smoker  . Smokeless tobacco: Never Used  . Alcohol use No  . Drug use: No  . Sexual activity: Not on file   Other Topics Concern  . Not on file   Social History Narrative  . No narrative on file     Family History  Problem Relation Age of Onset  . Family history unknown: Yes     Review of Systems: General: negative for chills, fever, night sweats or weight changes.  ENT: negative for rhinorrhea or epistaxis Cardiovascular: See history of present illness Dermatological: negative for rash Respiratory: negative for cough or wheezing GI:  negative for nausea, vomiting, diarrhea, bright red blood per rectum, melena, or hematemesis GU: no hematuria, urgency, or frequency Neurologic: negative for visual changes, syncope, headache, or dizziness Heme: no easy bruising or bleeding Endo: negative for excessive thirst, thyroid disorder, or flushing Musculoskeletal: negative for joint pain or swelling, negative for myalgias  All other systems reviewed and are otherwise negative except as noted above.  Physical Exam: Blood pressure (!) 147/75, pulse (!) 52, temperature 98.1 F (36.7 C), temperature source Oral, resp. rate 16, SpO2 94 %. Pt is alert and oriented, WD, WN, in no distress. HEENT: normal Neck: JVP normal. Carotid upstrokes normal without bruits. No thyromegaly. Lungs: equal expansion, clear bilaterally CV: Apex is discrete and nondisplaced, RRR without murmur or gallop Abd: soft, NT, +BS, no bruit, no hepatosplenomegaly Back: no CVA tenderness Ext: no C/C/E        DP/PT pulses intact and = Skin: warm and dry without rash Neuro: CNII-XII intact             Strength intact = bilaterally    Labs:  Recent Labs  03/19/16 1720 03/19/16 2242 03/20/16 0313 03/20/16 0718  TROPONINI 3.01* 2.95* 2.81* 2.53*   Lab Results  Component Value Date   WBC 4.9 03/20/2016   HGB 13.5 03/20/2016   HCT 37.6 (L) 03/20/2016   MCV 89.9 03/20/2016   PLT 173 03/20/2016    Recent Labs Lab 03/19/16 1720  NA 140  K 3.6  CL 109  CO2 21*  BUN 15  CREATININE 0.99  CALCIUM 9.1  GLUCOSE 125*   Lab Results  Component Value Date   CHOL 122 03/08/2016   HDL 44 03/08/2016   LDLCALC 57 03/08/2016   TRIG 106 03/08/2016   No results found for: DDIMER  Radiology/Studies:  Dg Chest 2 View  Result Date: 03/19/2016 CLINICAL DATA:  Mid sternal chest pain for 3 days. EXAM: CHEST  2 VIEW COMPARISON:  None. FINDINGS: Heart size is upper limits of normal. The lungs are clear without airspace disease or pulmonary edema. No large  pleural effusions. Degenerative changes in the thoracic spine. IMPRESSION: No active cardiopulmonary disease. Electronically Signed   By: Markus Daft M.D.   On: 03/19/2016 17:27    EKG: Normal sinus rhythm with right bundle branch block, occasional PAC, no change from previous tracing 10/06/2015.  2-D echocardiogram is pending  ASSESSMENT AND PLAN:  1. NSTEMI: By history the patient's event was approximately 3-4 days ago. His troponin trend is also suggestive of this now with flat/downward trending cardiac markers. I have recommended definitive evaluation with cardiac catheterization. This will be scheduled for Monday with Dr. Rockey Situ. I have reviewed the risks, indications, and alternatives to cardiac catheterization, possible angioplasty, and stenting with the patient. Risks include but are not limited to bleeding, infection, vascular injury, stroke,  myocardial infection, arrhythmia, kidney injury, radiation-related injury in the case of prolonged fluoroscopy use, emergency cardiac surgery, and death. The patient understands the risks of serious complication is 1-2 in [id number] with diagnostic cardiac cath and 1-2% or less with angioplasty/stenting.   For now would continue IV heparin, aspirin, beta blocker, and high intensity statin drug. If the patient has recurrent chest discomfort will add IV nitroglycerin and consider emergent cardiac catheterization.  2. Hyperlipidemia: Lipids at goal with an LDL of 57.  3. Essential hypertension: Blood pressure controlled. The patient is treated with multidrug antihypertensive therapy on a combination of amlodipine, carvedilol, and enalapril.  Disposition: Cardiac catheterization Monday or sooner if recurrent resting anginal symptoms.  SignedSherren Mocha MD, [facility] 03/20/2016, 1:22 PM

## 2016-03-20 NOTE — Progress Notes (Signed)
[place] at [place] NAME: Tupac Sunde    MR#:  [id number]  DATE OF BIRTH:  04-21-1936  SUBJECTIVE:   Pt. Here due to NSTEMI.  No chest pain presently or overnight. No complaints.   REVIEW OF SYSTEMS:    Review of Systems  Constitutional: Negative for chills and fever.  HENT: Negative for congestion and tinnitus.   Eyes: Negative for blurred vision and double vision.  Respiratory: Negative for cough, shortness of breath and wheezing.   Cardiovascular: Negative for chest pain, orthopnea and PND.  Gastrointestinal: Negative for abdominal pain, diarrhea, nausea and vomiting.  Genitourinary: Negative for dysuria and hematuria.  Neurological: Negative for dizziness, sensory change and focal weakness.  All other systems reviewed and are negative.   Nutrition: Heart Healthy Tolerating Diet: Yes Tolerating PT: Ambulatory   DRUG ALLERGIES:  No Known Allergies  VITALS:  Blood pressure (!) 147/75, pulse (!) 52, temperature 98.1 F (36.7 C), temperature source Oral, resp. rate 16, SpO2 94 %.  PHYSICAL EXAMINATION:   Physical Exam  GENERAL:  80 y.o.-year-old patient lying in the bed with no acute distress.  EYES: Pupils equal, round, reactive to light and accommodation. No scleral icterus. Extraocular muscles intact.  HEENT: Head atraumatic, normocephalic. Oropharynx and nasopharynx clear.  NECK:  Supple, no jugular venous distention. No thyroid enlargement, no tenderness.  LUNGS: Normal breath sounds bilaterally, no wheezing, rales, rhonchi. No use of accessory muscles of respiration.  CARDIOVASCULAR: S1, S2 normal. No murmurs, rubs, or gallops.  ABDOMEN: Soft, nontender, nondistended. Bowel sounds present. No organomegaly or mass.  EXTREMITIES: No cyanosis, clubbing or edema b/l.    NEUROLOGIC: Cranial nerves II through XII are intact. No focal Motor or sensory deficits b/l.   PSYCHIATRIC: The patient is alert and oriented x 3.  SKIN:  No obvious rash, lesion, or ulcer.    LABORATORY PANEL:   CBC  Recent Labs Lab 03/20/16 1050  WBC 4.9  HGB 13.5  HCT 37.6*  PLT 173   ------------------------------------------------------------------------------------------------------------------  Chemistries   Recent Labs Lab 03/19/16 1720  NA 140  K 3.6  CL 109  CO2 21*  GLUCOSE 125*  BUN 15  CREATININE 0.99  CALCIUM 9.1   ------------------------------------------------------------------------------------------------------------------  Cardiac Enzymes  Recent Labs Lab 03/20/16 0718  TROPONINI 2.53*   ------------------------------------------------------------------------------------------------------------------  RADIOLOGY:  Dg Chest 2 View  Result Date: 03/19/2016 CLINICAL DATA:  Mid sternal chest pain for 3 days. EXAM: CHEST  2 VIEW COMPARISON:  None. FINDINGS: Heart size is upper limits of normal. The lungs are clear without airspace disease or pulmonary edema. No large pleural effusions. Degenerative changes in the thoracic spine. IMPRESSION: No active cardiopulmonary disease. Electronically Signed   By: Markus Daft M.D.   On: 03/19/2016 17:27     ASSESSMENT AND PLAN:   80 year old male with past medical history of hypertension, hyperlipidemia, GERD, previous history of MI, coronary artery disease who presents to the hospital complaining of chest pain 2 days ago and ruled in for a non-ST elevation MI.  1. Non-ST elevation MI-patient rules in by cardiac markers given his elevated troponin at 3. -His MI was likely 2 days ago. He is currently asymptomatic. -Continue aspirin, heparin nomogram, Coreg, atorvastatin, lisinopril - Echo showing normal LV function w/out any wall motion abnormality.   - appreciate Cardiology input and plan for cath on Monday.  Trop has trended down.   2. Essential hypertension-continue Coreg, enalapril, Norvasc.  3. Hyperlipidemia-continue atorvastatin.  4.  GERD-continue Protonix   All the records are reviewed and case discussed with Care Management/Social Workerr. Management plans discussed with the patient, family and they are in agreement.  CODE STATUS: full Code  DVT Prophylaxis: Heparin gtt  TOTAL TIME TAKING CARE OF THIS PATIENT: 30 minutes.   POSSIBLE D/C IN 2-3 DAYS, DEPENDING ON CLINICAL CONDITION.   Henreitta Leber M.D on 03/20/2016 at 2:48 PM  Between 7am to 6pm - Pager - [phone]  After 6pm go to [email or web address] - password [email or web address] [facility]  [place] [facility]  Office  [phone]  CC: Primary care physician; Vernie Murders, PA

## 2016-03-20 NOTE — Progress Notes (Signed)
A&O. Independent. Admitted with positive troponins. Heparin drip infusing. Possible cardiac catheterization.

## 2016-03-21 LAB — CBC
HCT: 38.6 % — ABNORMAL LOW (ref 40.0–52.0)
Hemoglobin: 13.5 g/dL (ref 13.0–18.0)
MCH: 31.4 pg (ref 26.0–34.0)
MCHC: 34.9 g/dL (ref 32.0–36.0)
MCV: 89.8 fL (ref 80.0–100.0)
Platelets: 155 10*3/uL (ref 150–440)
RBC: 4.3 MIL/uL — ABNORMAL LOW (ref 4.40–5.90)
RDW: 14.2 % (ref 11.5–14.5)
WBC: 5.5 10*3/uL (ref 3.8–10.6)

## 2016-03-21 LAB — HEPARIN LEVEL (UNFRACTIONATED): HEPARIN UNFRACTIONATED: 0.61 [IU]/mL (ref 0.30–0.70)

## 2016-03-21 NOTE — Progress Notes (Signed)
Pt. Slept the entire night with no signs or c/o pain, SOB or acute distress noted.

## 2016-03-21 NOTE — Progress Notes (Signed)
[place] at [place] NAME: James Mora    MR#:  [id number]  DATE OF BIRTH:  09-17-1935  SUBJECTIVE:   Pt. Here due to NSTEMI.  No chest pain presently or overnight. No complaints.   REVIEW OF SYSTEMS:    Review of Systems  Constitutional: Negative for chills and fever.  HENT: Negative for congestion and tinnitus.   Eyes: Negative for blurred vision and double vision.  Respiratory: Negative for cough, shortness of breath and wheezing.   Cardiovascular: Negative for chest pain, orthopnea and PND.  Gastrointestinal: Negative for abdominal pain, diarrhea, nausea and vomiting.  Genitourinary: Negative for dysuria and hematuria.  Neurological: Negative for dizziness, sensory change and focal weakness.  All other systems reviewed and are negative.   Nutrition: Heart Healthy Tolerating Diet: Yes Tolerating PT: Ambulatory   DRUG ALLERGIES:  No Known Allergies  VITALS:  Blood pressure 132/68, pulse (!) 54, temperature 98.4 F (36.9 C), temperature source Oral, resp. rate 17, SpO2 95 %.  PHYSICAL EXAMINATION:   Physical Exam  GENERAL:  80 y.o.-year-old patient lying in the bed with no acute distress.  EYES: Pupils equal, round, reactive to light and accommodation. No scleral icterus. Extraocular muscles intact.  HEENT: Head atraumatic, normocephalic. Oropharynx and nasopharynx clear.  NECK:  Supple, no jugular venous distention. No thyroid enlargement, no tenderness.  LUNGS: Normal breath sounds bilaterally, no wheezing, rales, rhonchi. No use of accessory muscles of respiration.  CARDIOVASCULAR: S1, S2 normal. No murmurs, rubs, or gallops.  ABDOMEN: Soft, nontender, nondistended. Bowel sounds present. No organomegaly or mass.  EXTREMITIES: No cyanosis, clubbing or edema b/l.    NEUROLOGIC: Cranial nerves II through XII are intact. No focal Motor or sensory deficits b/l.   PSYCHIATRIC: The patient is alert and oriented x 3.  SKIN: No  obvious rash, lesion, or ulcer.    LABORATORY PANEL:   CBC  Recent Labs Lab 03/21/16 0417  WBC 5.5  HGB 13.5  HCT 38.6*  PLT 155   ------------------------------------------------------------------------------------------------------------------  Chemistries   Recent Labs Lab 03/19/16 1720  NA 140  K 3.6  CL 109  CO2 21*  GLUCOSE 125*  BUN 15  CREATININE 0.99  CALCIUM 9.1   ------------------------------------------------------------------------------------------------------------------  Cardiac Enzymes  Recent Labs Lab 03/20/16 0718  TROPONINI 2.53*   ------------------------------------------------------------------------------------------------------------------  RADIOLOGY:  Dg Chest 2 View  Result Date: 03/19/2016 CLINICAL DATA:  Mid sternal chest pain for 3 days. EXAM: CHEST  2 VIEW COMPARISON:  None. FINDINGS: Heart size is upper limits of normal. The lungs are clear without airspace disease or pulmonary edema. No large pleural effusions. Degenerative changes in the thoracic spine. IMPRESSION: No active cardiopulmonary disease. Electronically Signed   By: Markus Daft M.D.   On: 03/19/2016 17:27     ASSESSMENT AND PLAN:   80 year old male with past medical history of hypertension, hyperlipidemia, GERD, previous history of MI, coronary artery disease who presents to the hospital complaining of chest pain 2 days ago and ruled in for a non-ST elevation MI.  1. Non-ST elevation MI-patient ruled in by cardiac markers given his elevated troponin at 3. -His MI was likely 2 days ago. Remains asymptomatic. -Continue aspirin, heparin nomogram, Coreg, atorvastatin, lisinopril - Echo showing normal LV function w/out any wall motion abnormality.   - appreciate Cardiology input and plan for cardiac cath tomorrow.    2. Essential hypertension-continue Coreg, enalapril, Norvasc.  3. Hyperlipidemia-continue atorvastatin.  4. GERD-continue Protonix   All the  records  are reviewed and case discussed with Care Management/Social Workerr. Management plans discussed with the patient, family and they are in agreement.  CODE STATUS: full Code  DVT Prophylaxis: Heparin gtt  TOTAL TIME TAKING CARE OF THIS PATIENT: 25 minutes.   POSSIBLE D/C IN 1-2 DAYS, DEPENDING ON CLINICAL CONDITION.   Henreitta Leber M.D on 03/21/2016 at 1:53 PM  Between 7am to 6pm - Pager - [phone]  After 6pm go to [email or web address] - password [email or web address] [facility]  [place] [facility]  Office  [phone]  CC: Primary care physician; Vernie Murders, PA

## 2016-03-21 NOTE — Progress Notes (Signed)
    Subjective:  No CP overnight. No dyspnea or other complaints.   Objective:  Vital Signs in the last 24 hours: Temp:  [98.1 F (36.7 C)-98.3 F (36.8 C)] 98.1 F (36.7 C) (08/13 0444) Pulse Rate:  [53-62] 62 (08/13 0854) Resp:  [18] 18 (08/13 0444) BP: (128-146)/(64-72) 146/72 (08/13 0854) SpO2:  [95 %-96 %] 96 % (08/13 0854)  Intake/Output from previous day: 08/12 0701 - 08/13 0700 In: 606.5 [P.O.:480; I.V.:126.5] Out: -   Physical Exam: Pt is alert and oriented, NAD HEENT: normal Neck: JVP - normal Lungs: CTA bilaterally CV: RRR without murmur or gallop Abd: soft, NT, Positive BS, no hepatomegaly Ext: no C/C/E, distal pulses intact and equal Skin: warm/dry no rash   Lab Results:  Recent Labs  03/20/16 1050 03/21/16 0417  WBC 4.9 5.5  HGB 13.5 13.5  PLT 173 155    Recent Labs  03/19/16 1720  NA 140  K 3.6  CL 109  CO2 21*  GLUCOSE 125*  BUN 15  CREATININE 0.99    Recent Labs  03/20/16 0313 03/20/16 0718  TROPONINI 2.81* 2.53*   2D Echo: Study Conclusions  - Left ventricle: The cavity size was normal. There was mild   concentric hypertrophy. Systolic function was normal. The   estimated ejection fraction was in the range of 55% to 60%. Wall   motion was normal; there were no regional wall motion   abnormalities. Doppler parameters are consistent with abnormal   left ventricular relaxation (grade 1 diastolic dysfunction). - Aortic valve: Transvalvular velocity was within the normal range.   There was no stenosis. There was mild to moderate regurgitation.   Valve area (Vmax): 3 cm^2. - Mitral valve: Transvalvular velocity was within the normal range.   There was no evidence for stenosis. There was no regurgitation. - Right ventricle: The cavity size was normal. Wall thickness was   normal. Systolic function was normal. - Tricuspid valve: There was mild regurgitation.  Assessment/Plan:  1. NSTEMI 2. Hyperlipidemia 3. Essential  HTN  The patient has had no recurrent ischemic symptoms on IV heparin. Plans for cardiac catheterization tomorrow. Risks and indications have been reviewed and outlined in yesterday's note. Orders are written and the patient is added on to the cath schedule for Dr Rockey Situ. The patient is on a beta blocker, ACE inhibitor, aspirin, and high intensity statin drug.   Sherren Mocha, M.D. 03/21/2016, 11:57 AM

## 2016-03-22 ENCOUNTER — Encounter
Admission: EM | Disposition: A | Discharge status: Home / Self Care | Payer: Self-pay | Source: Home / Self Care | Attending: Specialist

## 2016-03-22 ENCOUNTER — Encounter: Payer: Self-pay | Admitting: Cardiovascular Disease

## 2016-03-22 DIAGNOSIS — I25111 Atherosclerotic heart disease of native coronary artery with angina pectoris with documented spasm: Secondary | ICD-10-CM

## 2016-03-22 DIAGNOSIS — I2510 Atherosclerotic heart disease of native coronary artery without angina pectoris: Secondary | ICD-10-CM

## 2016-03-22 HISTORY — PX: CARDIAC CATHETERIZATION: SHX172

## 2016-03-22 LAB — CBC
HEMATOCRIT: 41.2 % (ref 40.0–52.0)
HEMOGLOBIN: 14.2 g/dL (ref 13.0–18.0)
MCH: 31.2 pg (ref 26.0–34.0)
MCHC: 34.4 g/dL (ref 32.0–36.0)
MCV: 90.9 fL (ref 80.0–100.0)
Platelets: 172 10*3/uL (ref 150–440)
RBC: 4.53 MIL/uL (ref 4.40–5.90)
RDW: 14.2 % (ref 11.5–14.5)
WBC: 5.2 10*3/uL (ref 3.8–10.6)

## 2016-03-22 LAB — HEPARIN LEVEL (UNFRACTIONATED): HEPARIN UNFRACTIONATED: 0.65 [IU]/mL (ref 0.30–0.70)

## 2016-03-22 SURGERY — LEFT HEART CATH
Anesthesia: Moderate Sedation

## 2016-03-22 MED ORDER — SODIUM CHLORIDE 0.9 % WEIGHT BASED INFUSION
1.0000 mL/kg/h | INTRAVENOUS | Status: DC
  Administered 2016-03-22: 1 mL/kg/h via INTRAVENOUS

## 2016-03-22 MED ORDER — MIDAZOLAM HCL 2 MG/2ML IJ SOLN
INTRAMUSCULAR | Status: AC
  Filled 2016-03-22: qty 2

## 2016-03-22 MED ORDER — FENTANYL CITRATE (PF) 100 MCG/2ML IJ SOLN
INTRAMUSCULAR | Status: DC | PRN
  Administered 2016-03-22: 25 ug via INTRAVENOUS

## 2016-03-22 MED ORDER — ASPIRIN 81 MG PO CHEW
81.0000 mg | CHEWABLE_TABLET | ORAL | Status: DC
Start: 2016-03-23 — End: 2016-03-22

## 2016-03-22 MED ORDER — FENTANYL CITRATE (PF) 100 MCG/2ML IJ SOLN
INTRAMUSCULAR | Status: AC
  Filled 2016-03-22: qty 2

## 2016-03-22 MED ORDER — SODIUM CHLORIDE 0.9% FLUSH
3.0000 mL | INTRAVENOUS | Status: DC | PRN
  Administered 2016-03-22: 3 mL via INTRAVENOUS
  Filled 2016-03-22: qty 3

## 2016-03-22 MED ORDER — MIDAZOLAM HCL 2 MG/2ML IJ SOLN
INTRAMUSCULAR | Status: DC | PRN
Start: 2016-03-22 — End: 2016-03-22
  Administered 2016-03-22: 1 mg via INTRAVENOUS

## 2016-03-22 MED ORDER — IOPAMIDOL (ISOVUE-300) INJECTION 61%
INTRAVENOUS | Status: DC | PRN
  Administered 2016-03-22: 105 mL via INTRA_ARTERIAL

## 2016-03-22 MED ORDER — SODIUM CHLORIDE 0.9 % WEIGHT BASED INFUSION
3.0000 mL/kg/h | INTRAVENOUS | Status: DC
  Administered 2016-03-22: 3 mL/kg/h via INTRAVENOUS

## 2016-03-22 MED ORDER — HEPARIN (PORCINE) IN NACL 2-0.9 UNIT/ML-% IJ SOLN
INTRAMUSCULAR | Status: AC
  Filled 2016-03-22: qty 500

## 2016-03-22 MED ORDER — MORPHINE SULFATE (PF) 2 MG/ML IV SOLN
INTRAVENOUS | Status: DC
Start: 2016-03-22 — End: 2016-03-22
  Filled 2016-03-22: qty 1

## 2016-03-22 MED ORDER — SODIUM CHLORIDE 0.9 % IV SOLN: 250.0000 mL | INTRAVENOUS | Status: DC | PRN

## 2016-03-22 MED ORDER — SODIUM CHLORIDE 0.9% FLUSH
3.0000 mL | Freq: Two times a day (BID) | INTRAVENOUS | Status: DC
  Administered 2016-03-22 (×2): 3 mL via INTRAVENOUS

## 2016-03-22 SURGICAL SUPPLY — 8 items
CATH INFINITI 5FR ANG PIGTAIL (CATHETERS) ×3 IMPLANT
CATH INFINITI 5FR JL4 (CATHETERS) ×3 IMPLANT
CATH INFINITI JR4 5F (CATHETERS) ×3 IMPLANT
KIT MANI 3VAL PERCEP (MISCELLANEOUS) ×3 IMPLANT
NEEDLE PERC 18GX7CM (NEEDLE) ×3 IMPLANT
PACK CARDIAC CATH (CUSTOM PROCEDURE TRAY) ×3 IMPLANT
SHEATH AVANTI 5FR X 11CM (SHEATH) ×3 IMPLANT
WIRE EMERALD 3MM-J .035X150CM (WIRE) ×3 IMPLANT

## 2016-03-22 NOTE — Progress Notes (Signed)
Cardiac cath  Mild proximal LAD disease,  Small vessel disease No other regions of significant stenosis  Recommendations:  Medical management recommended, Continue current meds, Patient is nonsmoker, no diabetes, lipids at goal Follow up with Dr. Fletcher Anon  Signed, Esmond Plants, MD, Ph.D [facility]

## 2016-03-22 NOTE — Progress Notes (Signed)
Procedure consent signed by Pt. Pre-procedure fluids hung and pre-procedure Asprin 81mg  given. Right Groin and Right Wrist prep. Pt has been NPO since midnight. James Mora

## 2016-03-22 NOTE — Progress Notes (Signed)
R groin WNL/ ambulated around nurses station/ tolerated well/ transported off unit via wheelchair.

## 2016-03-22 NOTE — Consult Note (Signed)
ANTICOAGULATION CONSULT NOTE - Initial Consult  Pharmacy Consult for heparin drip Indication: chest pain/ACS  No Known Allergies  Patient Measurements: Weight: (P) 179 lb 1.6 oz (81.2 kg) Heparin Dosing Weight: 83.7kg  Vital Signs: Temp: 98.4 F (36.9 C) (08/13 1943) Temp Source: Oral (08/13 1943) BP: 154/78 (08/14 0617) Pulse Rate: 56 (08/14 0617)  Labs:  Recent Labs  03/19/16 1720 03/19/16 2242  03/20/16 0313 03/20/16 0718 03/20/16 1050 03/20/16 1955 03/21/16 0417 03/22/16 0632  HGB 14.5  --   --   --   --  13.5  --  13.5 14.2  HCT 41.9  --   --   --   --  37.6*  --  38.6* 41.2  PLT 159  --   --   --   --  173  --  155 172  APTT 29  --   --   --   --   --   --   --   --   LABPROT 13.9  --   --   --   --   --   --   --   --   INR 1.07  --   --   --   --   --   --   --   --   HEPARINUNFRC  --   --   < > 0.42  --  0.28* 0.47 0.61 0.65  CREATININE 0.99  --   --   --   --   --   --   --   --   TROPONINI 3.01* 2.95*  --  2.81* 2.53*  --   --   --   --   < > = values in this interval not displayed.  Estimated Creatinine Clearance: 66.5 mL/min (by C-G formula based on SCr of 0.99 mg/dL).   Medical History: Past Medical History:  Diagnosis Date  . Acid reflux   . Aortic insufficiency    Mild to moderate  . CAD (coronary artery disease)    Severe distal LAD disease with left to left collaterals and cardiac cath in 2000. Most recent nuclear stress test in 2008 was normal.  . High blood cholesterol level 2004  . HLD (hyperlipidemia)    mixed  . HTN (hypertension) 2005   unspecified    Medications:  Scheduled:  . amLODipine  5 mg Oral QPM  . [START ON 03/23/2016] aspirin  81 mg Oral Pre-Cath  . aspirin EC  81 mg Oral Daily  . atorvastatin  80 mg Oral q1800  . carvedilol  12.5 mg Oral BID  . enalapril  20 mg Oral QPM  . pantoprazole  40 mg Oral Daily  . sodium chloride flush  3 mL Intravenous Q12H    Assessment: Pt is a 80 year old male with chest pain and  elevated troponin. Pharmacy consulted to dose heparin drip. No anticoagulants see on in patient med rec. Baseline labs ordered.   Goal of Therapy:  Heparin level 0.3-0.7 units/ml Monitor platelets by anticoagulation protocol: Yes   Plan:  HL= 0.47 @ 1955 which is WNL. Will continue heparin infusion at current rate of 1150 units/hr. Will check confirmatory HL in 8 hours.  8/13 AM heparin level 0.61. Continue current regimen. Recheck CBC and heparin level tomorrow AM.  8/14 AM heparin level 0.65. Continue current regimen. Recheck CBC and heparin level tomorrow AM.  Eloise Harman, PharmD 03/22/2016,7:15 AM

## 2016-03-22 NOTE — Progress Notes (Signed)
Discharge instructions explained to pt/ verbalized an understanding/ iv and tele removed/ will transport off unit via wheelchair.  

## 2016-03-22 NOTE — Discharge Summary (Signed)
[place] at [place] NAME: James Mora    MR#:  [id number]  DATE OF BIRTH:  March 17, 1936  DATE OF ADMISSION:  03/19/2016 ADMITTING PHYSICIAN: Henreitta Leber, MD  DATE OF DISCHARGE: 03/22/2016  1:18 PM  PRIMARY CARE PHYSICIAN: Vernie Murders, PA    ADMISSION DIAGNOSIS:  Non-ST elevation (NSTEMI) myocardial infarction ([place]) [I21.4]  DISCHARGE DIAGNOSIS:  Active Problems:   NSTEMI (non-ST elevated myocardial infarction) ([place])   Coronary artery disease involving native coronary artery of native heart with angina pectoris with documented spasm ([place])   SECONDARY DIAGNOSIS:   Past Medical History:  Diagnosis Date  . Acid reflux   . Aortic insufficiency    Mild to moderate  . CAD (coronary artery disease)    Severe distal LAD disease with left to left collaterals and cardiac cath in 2000. Most recent nuclear stress test in 2008 was normal.  . High blood cholesterol level 2004  . HLD (hyperlipidemia)    mixed  . HTN (hypertension) 2005   unspecified    HOSPITAL COURSE:   80 year old male with past medical history of hypertension, hyperlipidemia, GERD, previous history of MI, coronary artery disease who presents to the hospital complaining of chest pain 2 days ago and ruled in for a non-ST elevation MI.  1. Non-ST elevation MI-patient ruled in by cardiac markers given his elevated troponin at 3. -His MI was likely 2 days prior to admission. He remained asymptomatic while in the hospital.  - he was maintained on aspirin, heparin nomogram, Coreg, atorvastatin, lisinopril - his Echo showing normal LV function w/out any wall motion abnormality.  He was seen by cardiology and underwent cardiac catheterization on 03/22/2016 which showed no significant coronary disease that needed intervention. -He is being managed medically on the medications as mentioned above and will follow-up with cardiology as an outpatient.    2. Essential hypertension-  he will continue Coreg, enalapril, Norvasc.  3. Hyperlipidemia- he will continue atorvastatin.  4. GERD- he will continue Omeprazole  DISCHARGE CONDITIONS:   Stable  CONSULTS OBTAINED:  Treatment Team:  Lelon Perla, MD Sherren Mocha, MD  DRUG ALLERGIES:  No Known Allergies  DISCHARGE MEDICATIONS:     Medication List    TAKE these medications   amLODipine 5 MG tablet Commonly known as:  NORVASC Take 5 mg by mouth every evening.   aspirin 325 MG tablet Take 325 mg by mouth daily.   atorvastatin 20 MG tablet Commonly known as:  LIPITOR Take 20 mg by mouth daily at 6 PM.   carvedilol 12.5 MG tablet Commonly known as:  COREG Take 1 tablet (12.5 mg total) by mouth 2 (two) times daily.   enalapril 20 MG tablet Commonly known as:  VASOTEC Take 20 mg by mouth every evening.   nitroGLYCERIN 0.4 MG SL tablet Commonly known as:  NITROSTAT Place 0.4 mg under the tongue every 5 (five) minutes as needed.   omeprazole 20 MG capsule Commonly known as:  PRILOSEC Take 2 capsules (40 mg total) by mouth daily.         DISCHARGE INSTRUCTIONS:   DIET:  Cardiac diet  DISCHARGE CONDITION:  Stable  ACTIVITY:  Activity as tolerated  OXYGEN:  Home Oxygen: No.   Oxygen Delivery: room air  DISCHARGE LOCATION:  home   If you experience worsening of your admission symptoms, develop shortness of breath, life threatening emergency, suicidal or homicidal thoughts you must seek medical attention immediately by calling 911 or  calling your MD immediately  if symptoms less severe.  You Must read complete instructions/literature along with all the possible adverse reactions/side effects for all the Medicines you take and that have been prescribed to you. Take any new Medicines after you have completely understood and accpet all the possible adverse reactions/side effects.   Please note  You were cared for by a hospitalist during your hospital stay. If you have any  questions about your discharge medications or the care you received while you were in the hospital after you are discharged, you can call the unit and asked to speak with the hospitalist on call if the hospitalist that took care of you is not available. Once you are discharged, your primary care physician will handle any further medical issues. Please note that NO REFILLS for any discharge medications will be authorized once you are discharged, as it is imperative that you return to your primary care physician (or establish a relationship with a primary care physician if you do not have one) for your aftercare needs so that they can reassess your need for medications and monitor your lab values.     Today   No chest pain, shortness of breath today. S/p Cardiac cath showing no significant CAD.    VITAL SIGNS:  Blood pressure (!) 157/68, pulse (!) 50, temperature 97.7 F (36.5 C), temperature source Oral, resp. rate 18, weight 81.2 kg (179 lb 1.6 oz), SpO2 98 %.  I/O:   Intake/Output Summary (Last 24 hours) at 03/22/16 1603 Last data filed at 03/22/16 0647  Gross per 24 hour  Intake            629.1 ml  Output                0 ml  Net            629.1 ml    PHYSICAL EXAMINATION:  GENERAL:  80 y.o.-year-old patient lying in the bed with no acute distress.  EYES: Pupils equal, round, reactive to light and accommodation. No scleral icterus. Extraocular muscles intact.  HEENT: Head atraumatic, normocephalic. Oropharynx and nasopharynx clear.  NECK:  Supple, no jugular venous distention. No thyroid enlargement, no tenderness.  LUNGS: Normal breath sounds bilaterally, no wheezing, rales,rhonchi. No use of accessory muscles of respiration.  CARDIOVASCULAR: S1, S2 normal. No murmurs, rubs, or gallops.  ABDOMEN: Soft, non-tender, non-distended. Bowel sounds present. No organomegaly or mass.  EXTREMITIES: No pedal edema, cyanosis, or clubbing.  NEUROLOGIC: Cranial nerves II through XII are  intact. No focal motor or sensory defecits b/l.  PSYCHIATRIC: The patient is alert and oriented x 3. Good affect.  SKIN: No obvious rash, lesion, or ulcer.   DATA REVIEW:   CBC  Recent Labs Lab 03/22/16 0632  WBC 5.2  HGB 14.2  HCT 41.2  PLT 172    Chemistries   Recent Labs Lab 03/19/16 1720  NA 140  K 3.6  CL 109  CO2 21*  GLUCOSE 125*  BUN 15  CREATININE 0.99  CALCIUM 9.1    Cardiac Enzymes  Recent Labs Lab 03/20/16 0718  TROPONINI 2.53*    Microbiology Results  No results found for this or any previous visit.  RADIOLOGY:  No results found.    Management plans discussed with the patient, family and they are in agreement.  CODE STATUS:     Code Status Orders        Start     Ordered   03/19/16 2203  Full  code  Continuous     03/19/16 2202    Code Status History    Date Active Date Inactive Code Status Order ID Comments User Context   03/19/2016 10:02 PM 03/20/2016  7:51 AM Full Code [id number]  Henreitta Leber, MD Inpatient      TOTAL TIME TAKING CARE OF THIS PATIENT: 40 minutes.    Henreitta Leber M.D on 03/22/2016 at 4:03 PM  Between 7am to 6pm - Pager - [phone]  After 6pm go to [email or web address] - password [email or web address] [facility]  [place] [facility]  Office  [phone]  CC: Primary care physician; Vernie Murders, PA

## 2016-03-22 NOTE — Care Management (Signed)
Informed by care team members that there are no discharge needs for this patient.

## 2016-03-24 ENCOUNTER — Telehealth: Payer: Self-pay

## 2016-03-24 NOTE — Telephone Encounter (Signed)
Hospital discharge notification received. Patient was discharged on 03/22/2016. Left patient a message to return call for transition into care and to schedule a hospital follow up appointment.   Thanks, [employer]

## 2016-03-24 NOTE — Telephone Encounter (Signed)
Patient states he is feeling much better since being discharged on 03/22/2016. Patient's medicines remain the same.  Attempted to schedule a hospital follow up appointment with Simona Huh, but patient states he is going to see the cardiologist on 04/08/2016.   Thanks, [employer]

## 2016-04-05 ENCOUNTER — Other Ambulatory Visit: Payer: Self-pay | Admitting: Cardiovascular Disease

## 2016-04-05 DIAGNOSIS — H16223 Keratoconjunctivitis sicca, not specified as Sjogren's, bilateral: Secondary | ICD-10-CM | POA: Diagnosis not present

## 2016-04-08 ENCOUNTER — Encounter: Payer: Self-pay | Admitting: Nurse Practitioner

## 2016-04-08 ENCOUNTER — Ambulatory Visit (INDEPENDENT_AMBULATORY_CARE_PROVIDER_SITE_OTHER): Payer: Medicare Other | Admitting: Nurse Practitioner

## 2016-04-08 VITALS — BP 150/80 | HR 60 | Ht 70.0 in | Wt 182.2 lb

## 2016-04-08 DIAGNOSIS — E785 Hyperlipidemia, unspecified: Secondary | ICD-10-CM | POA: Insufficient documentation

## 2016-04-08 DIAGNOSIS — I25119 Atherosclerotic heart disease of native coronary artery with unspecified angina pectoris: Secondary | ICD-10-CM

## 2016-04-08 DIAGNOSIS — I214 Non-ST elevation (NSTEMI) myocardial infarction: Secondary | ICD-10-CM | POA: Diagnosis not present

## 2016-04-08 DIAGNOSIS — E782 Mixed hyperlipidemia: Secondary | ICD-10-CM | POA: Diagnosis not present

## 2016-04-08 DIAGNOSIS — I351 Nonrheumatic aortic (valve) insufficiency: Secondary | ICD-10-CM | POA: Diagnosis not present

## 2016-04-08 DIAGNOSIS — I119 Hypertensive heart disease without heart failure: Secondary | ICD-10-CM

## 2016-04-08 DIAGNOSIS — I2510 Atherosclerotic heart disease of native coronary artery without angina pectoris: Secondary | ICD-10-CM | POA: Insufficient documentation

## 2016-04-08 MED ORDER — AMLODIPINE BESYLATE 10 MG PO TABS: 10.0000 mg | ORAL_TABLET | Freq: Every evening | ORAL | 3 refills | Status: DC

## 2016-04-08 NOTE — Patient Instructions (Signed)
Medication Instructions:  Your physician has recommended you make the following change in your medication:  INCREASE amlodipine to 10mg  one daily.    Labwork: none  Testing/Procedures: none  Follow-Up: Your physician recommends that you schedule a follow-up appointment in: 3 months with Dr. Fletcher Anon.   Any Other Special Instructions Will Be Listed Below (If Applicable). Continue to monitor your BP 1-2 times a day.      If you need a refill on your cardiac medications before your next appointment, please call your pharmacy.

## 2016-04-08 NOTE — Progress Notes (Signed)
Office Visit    Patient Name: James Mora Date of Encounter: 04/08/2016  Primary Care Provider:  Vernie Murders, [place] Primary Cardiologist:  Jerilynn Mages. Fletcher Anon, MD   Chief Complaint    80 year old male with prior history of CAD, hypertension, hyperlipidemia and aortic insufficiency, who presents for follow-up after recent catheterization.  Past Medical History    Past Medical History:  Diagnosis Date  . Acid reflux   . Aortic insufficiency    a. 03/2016 Echo: EF 55-60%, no rwma, Gr1 DD, mild to mod AI, mild TR.  Marland Kitchen CAD (coronary artery disease)    a. 2000 Cath: Severe distal LAD disease with left to left collaterals;  b. 2008 MV: non-ischemic; c. 03/2016 Cath: LM nl, LAD 30p, D1/D2 mod, nl, LCX nl, OM1/2 nl w/ L-->L collats to apical LAD, RCA dominant, nl. EF 55-65%.  . Hypertensive heart disease 2005   unspecified  . Mixed hyperlipidemia    mixed   Past Surgical History:  Procedure Laterality Date  . CARDIAC CATHETERIZATION  2000   left heart  . CARDIAC CATHETERIZATION N/A 03/22/2016   Procedure: Left Heart Cath And Coronary Angiography;  Surgeon: Minna Merritts, MD;  Location: [facility];  Service: Cardiovascular;  Laterality: N/A;  . COLONOSCOPY  2008   Dr. Allen Norris    Allergies  No Known Allergies  History of Present Illness    80 year old male with the above complex past medical history including coronary artery disease with prior finding of severe distal LAD disease with left to left collaterals. He also has a history of hypertension, hyperlipidemia, and mild to moderate aortic insufficiency. He was recently seen in clinic earlier this month with a several day history of chest pain. He was sent to the hospital for troponin evaluation and this returned elevated at 3.55. He was subsequently admitted. Troponin trended down following admission. He underwent diagnostic catheterization revealing mild, nonobstructive proximal LAD disease with left to left collaterals to the  distal LAD as previously described. The circumflex and right coronary artery were normal. LV function was normal. Medical therapy was recommended.  Echo showed normal LV and RV function. He was subsequently discharged.  Since discharge, he has done quite well. He has had no recurrence of chest pain or dyspnea. He has been walking regularly over the past week without limitations. He denies PND, orthopnea, dizziness, syncope, edema, or early satiety.  Home Medications    Prior to Admission medications   Medication Sig Start Date End Date Taking? Authorizing Provider  amLODipine (NORVASC) 10 MG tablet Take 1 tablet (10 mg total) by mouth every evening. 04/08/16  Yes Rogelia Mire, NP  aspirin 81 MG tablet Take 81 mg by mouth daily.   Yes Historical Provider, MD  atorvastatin (LIPITOR) 20 MG tablet Take 20 mg by mouth daily at 6 PM.   Yes Historical Provider, MD  carvedilol (COREG) 12.5 MG tablet take 1 tablet by mouth twice a day 04/05/16  Yes Wellington Hampshire, MD  enalapril (VASOTEC) 20 MG tablet Take 20 mg by mouth every evening.   Yes Historical Provider, MD  nitroGLYCERIN (NITROSTAT) 0.4 MG SL tablet Place 0.4 mg under the tongue every 5 (five) minutes as needed.     Yes Historical Provider, MD  omeprazole (PRILOSEC) 20 MG capsule Take 2 capsules (40 mg total) by mouth daily. 03/05/16  Yes [place], [place]    Review of Systems    He denies chest pain, palpitations, dyspnea, pnd, orthopnea,  n, v, dizziness, syncope, edema, weight gain, or early satiety.  All other systems reviewed and are otherwise negative except as noted above.  Physical Exam    VS:  BP (!) 150/80 (BP Location: Left Arm, Patient Position: Sitting, Cuff Size: Normal)   Pulse 60   Ht 5\' 10"  (1.778 m)   Wt 182 lb 4 oz (82.7 kg)   BMI 26.15 kg/m  , BMI Body mass index is 26.15 kg/m.  Blood pressure on repeat 150/78 GEN: Well nourished, well developed, in no acute distress.  HEENT: normal.  Neck: Supple, no JVD,  carotid bruits, or masses. Cardiac: RRR, no murmurs, rubs, or gallops. No clubbing, cyanosis, edema.  Radials/DP/PT 2+ and equal bilaterally.  Respiratory:  Respirations regular and unlabored, clear to auscultation bilaterally. GI: Soft, nontender, nondistended, BS + x 4. MS: no deformity or atrophy. Skin: warm and dry, no rash. Neuro:  Strength and sensation are intact. Psych: Normal affect.  Accessory Clinical Findings    ECG - Regular sinus rhythm, 60, right bundle branch block, no acute ST or T changes.  Assessment & Plan    1.  Non-ST segment elevation myocardial infarction, subsequent episode of care/CAD: Patient was recently admitted following a prolonged period of chest pain and elevated troponins. He remained on heparin over the weekend and then underwent catheterization revealing mild, nonobstructive CAD. Exact etiology of symptoms and enzymatic changes is not clear. There was no mention of vasospasm on cath.  He has been medically managed with aspirin, high potency statin, beta blocker, ACE inhibitor, and calcium channel blocker therapy. He has had no recurrence of chest pain or dyspnea and has otherwise been doing well. I will titrate his amlodipine today given elevated blood pressure.  2. Hypertensive heart disease: Blood pressure is elevated today with a systolic [id number]. He does not check his pressure at home but does have a cough. He says when he has checked in the past he has never got below 140. I'm increasing amlodipine to 10 mg daily. He otherwise remains beta blocker and ACE inhibitor.  3. Hyperlipidemia: LDL in July was 57. Continue statin therapy.  4. Mild to moderate aortic insufficiency: This is stable on most recent echo on August 12 of this year.  5. Disposition: Follow-up with Dr. Fletcher Anon in 3 months or sooner if necessary. I have recommended to patient that he follow his blood pressure at least once daily at home and call us if pressures are consistently over  140.  Murray Hodgkins, NP 04/08/2016, 11:45 AM

## 2016-04-15 ENCOUNTER — Ambulatory Visit: Payer: Medicare Other | Admitting: Cardiovascular Disease

## 2016-05-04 ENCOUNTER — Other Ambulatory Visit: Payer: Self-pay | Admitting: Cardiovascular Disease

## 2016-06-10 ENCOUNTER — Telehealth: Payer: Self-pay | Admitting: Family Medicine

## 2016-06-10 NOTE — Telephone Encounter (Signed)
Called Pt to schedule AWV with [facility] - knb °

## 2016-06-18 ENCOUNTER — Ambulatory Visit (INDEPENDENT_AMBULATORY_CARE_PROVIDER_SITE_OTHER): Payer: Medicare Other | Admitting: Family Medicine

## 2016-06-18 ENCOUNTER — Encounter: Payer: Self-pay | Admitting: Family Medicine

## 2016-06-18 VITALS — BP 120/64 | HR 56 | Temp 97.9°F | Resp 14 | Ht 70.0 in | Wt 184.0 lb

## 2016-06-18 DIAGNOSIS — Z0000 Encounter for general adult medical examination without abnormal findings: Secondary | ICD-10-CM

## 2016-06-18 DIAGNOSIS — I25111 Atherosclerotic heart disease of native coronary artery with angina pectoris with documented spasm: Secondary | ICD-10-CM

## 2016-06-18 DIAGNOSIS — K2100 Gastro-esophageal reflux disease with esophagitis, without bleeding: Secondary | ICD-10-CM

## 2016-06-18 DIAGNOSIS — E782 Mixed hyperlipidemia: Secondary | ICD-10-CM

## 2016-06-18 DIAGNOSIS — N400 Enlarged prostate without lower urinary tract symptoms: Secondary | ICD-10-CM

## 2016-06-18 DIAGNOSIS — I10 Essential (primary) hypertension: Secondary | ICD-10-CM

## 2016-06-18 DIAGNOSIS — R972 Elevated prostate specific antigen [PSA]: Secondary | ICD-10-CM | POA: Insufficient documentation

## 2016-06-18 DIAGNOSIS — M9979 Connective tissue and disc stenosis of intervertebral foramina of abdomen and other regions: Secondary | ICD-10-CM | POA: Insufficient documentation

## 2016-06-18 NOTE — Progress Notes (Signed)
Patient: James Mora, Male    DOB: 08/18/1935, 80 y.o.   MRN: [id number] Visit Date: 06/18/2016  Today's Provider: Vernie Murders, PA   Chief Complaint  Patient presents with  . Medicare Wellness   Subjective:    Annual physical exam James Mora is a 80 y.o. male who presents today for health maintenance and complete physical. He feels well. He reports exercising, he does yard work. He reports he is sleeping well.  ----------------------------------------------------------------- Colonoscopy-02/24/05- Benign polyp and diverticulosis Pt declines vaccines today.  Review of Systems  Constitutional: Negative.   HENT: Positive for facial swelling.   Eyes: Positive for itching.  Respiratory: Negative.   Cardiovascular: Negative.   Gastrointestinal: Negative.   Endocrine: Negative.   Genitourinary: Positive for difficulty urinating.  Musculoskeletal: Positive for back pain.  Skin: Negative.   Allergic/Immunologic: Negative.   Neurological: Negative.   Hematological: Negative.   Psychiatric/Behavioral: Negative.    Cognitive Testing - 6-CIT  Correct? Score   What year is it? yes 0 0 or 4  What month is it? yes 0 0 or 3  Memorize:    Pia Mau,  44,  [place],      What time is it? (within 1 hour) yes 0 0 or 3  Count backwards from 20 yes 0 0, 2, or 4  Name the months of the year yes 0 0, 2, or 4  Repeat name & address above no 4 0, 2, 4, 6, 8, or 10       TOTAL SCORE  4/28   Interpretation:  Normal  Normal (0-7) Abnormal (8-28)     Social History      He  reports that he has never smoked. He has never used smokeless tobacco. He reports that he does not drink alcohol or use drugs.       Social History   Social History  . Marital status: Married    Spouse name: N/A  . Number of children: N/A  . Years of education: N/A   Social History Main Topics  . Smoking status: Never Smoker  . Smokeless tobacco: Never Used  . Alcohol use No  .  Drug use: No  . Sexual activity: Not Asked   Other Topics Concern  . None   Social History Narrative  . None    Past Medical History:  Diagnosis Date  . Acid reflux   . Aortic insufficiency    a. 03/2016 Echo: EF 55-60%, no rwma, Gr1 DD, mild to mod AI, mild TR.  Marland Kitchen CAD (coronary artery disease)    a. 2000 Cath: Severe distal LAD disease with left to left collaterals;  b. 2008 MV: non-ischemic; c. 03/2016 Cath: LM nl, LAD 30p, D1/D2 mod, nl, LCX nl, OM1/2 nl w/ L-->L collats to apical LAD, RCA dominant, nl. EF 55-65%.  . Hypertensive heart disease 2005   unspecified  . Mixed hyperlipidemia    mixed     Patient Active Problem List   Diagnosis Date Noted  . Narrowing of intervertebral disc space 06/18/2016  . Elevated prostate specific antigen (PSA) 06/18/2016  . Benign fibroma of prostate 06/18/2016  . HLD (hyperlipidemia)   . Mixed hyperlipidemia   . CAD (coronary artery disease)   . Coronary artery disease involving native coronary artery of native heart with angina pectoris with documented spasm ([place])   . NSTEMI (non-ST elevated myocardial infarction) ([place]) 03/19/2016  . HTN (hypertension)   . High  blood cholesterol level   . Acid reflux   . Hyperlipidemia 12/27/2008  . Essential hypertension 12/27/2008  . Coronary atherosclerosis 12/27/2008  . Aortic valve disorder 12/27/2008  . Esophagitis, reflux 06/06/2006  . Hypertensive heart disease 08/10/2003    Past Surgical History:  Procedure Laterality Date  . CARDIAC CATHETERIZATION  2000   left heart  . CARDIAC CATHETERIZATION N/A 03/22/2016   Procedure: Left Heart Cath And Coronary Angiography;  Surgeon: Minna Merritts, MD;  Location: [place];  Service: Cardiovascular;  Laterality: N/A;  . COLONOSCOPY  2008   Dr. Allen Norris    Family History        No family status information on file.        His Family history is unknown by patient.     No Known Allergies   Current Outpatient Prescriptions:  .   amLODipine (NORVASC) 10 MG tablet, Take 1 tablet (10 mg total) by mouth every evening., Disp: 30 tablet, Rfl: 3 .  aspirin 81 MG tablet, Take 81 mg by mouth daily., Disp: , Rfl:  .  atorvastatin (LIPITOR) 20 MG tablet, Take 20 mg by mouth daily at 6 PM., Disp: , Rfl:  .  carvedilol (COREG) 12.5 MG tablet, take 1 tablet by mouth twice a day, Disp: 60 tablet, Rfl: 3 .  enalapril (VASOTEC) 20 MG tablet, take 1 tablet by mouth once daily, Disp: 30 tablet, Rfl: 3 .  nitroGLYCERIN (NITROSTAT) 0.4 MG SL tablet, Place 0.4 mg under the tongue every 5 (five) minutes as needed.  , Disp: , Rfl:  .  omeprazole (PRILOSEC) 20 MG capsule, Take 2 capsules (40 mg total) by mouth daily., Disp: 60 capsule, Rfl: 6   Patient Care Team: [name], PA as PCP - General (Family Medicine)      Objective:   Vitals: BP 120/64 (BP Location: Right Arm, Patient Position: Sitting, Cuff Size: Normal)   Pulse (!) 56   Temp 97.9 F (36.6 C) (Oral)   Resp 14   Ht 5\' 10"  (1.778 m)   Wt 184 lb (83.5 kg)   BMI 26.40 kg/m    Physical Exam  Constitutional: He is oriented to person, place, and time. He appears well-developed and well-nourished.  HENT:  Head: Normocephalic and atraumatic.  Right Ear: External ear normal.  Left Ear: External ear normal.  Nose: Nose normal.  Mouth/Throat: Oropharynx is clear and moist.  Eyes: Conjunctivae and EOM are normal. Pupils are equal, round, and reactive to light. Right eye exhibits no discharge.  Neck: Normal range of motion. Neck supple. No tracheal deviation present. No thyromegaly present.  Cardiovascular: Normal rate, regular rhythm, normal heart sounds and intact distal pulses.   No murmur heard. Pulmonary/Chest: Effort normal and breath sounds normal. No respiratory distress. He has no wheezes. He has no rales. He exhibits no tenderness.  Abdominal: Soft. He exhibits no distension and no mass. There is no tenderness. There is no rebound and no guarding.    Genitourinary: Rectum normal and penis normal. Rectal exam shows guaiac negative stool.  Genitourinary Comments: Prostate enlargement without nodules.  Musculoskeletal: He exhibits no edema or tenderness.  Complete loss of right ankle ROM due to fusion from crush injury in 1960. Good pulses throughout. Soreness in left shoulder with history of arthritis - some decrease in ROM. Stiffness in neck to rotate or extend. History of DDD at C-5-6 and facet arthropathy at all levels per x-rays.  Lymphadenopathy:    He has no cervical  adenopathy.  Neurological: He is alert and oriented to person, place, and time. He has normal reflexes. No cranial nerve deficit. He exhibits normal muscle tone. Coordination normal.  Skin: Skin is warm and dry. No rash noted. No erythema.  Psychiatric: He has a normal mood and affect. His behavior is normal. Judgment and thought content normal.     Depression Screen PHQ 2/9 Scores 06/18/2016  PHQ - 2 Score 0    Assessment & Plan:     Routine Health Maintenance and Physical Exam  Exercise Activities and Dietary recommendations Goals    Primary source of exercise is working in his yard each week as many days as possible.      Health Maintenance  Topic Date Due  . TETANUS/TDAP  05/16/1957  . ZOSTAVAX  05/16/1998  . PNA vac Low Risk Adult (1 of 2 - PCV13) 05/17/2003  . INFLUENZA VACCINE  03/09/2016     Discussed health benefits of physical activity, and encouraged him to engage in regular exercise appropriate for his age and condition.    -------------------------------------------------------------------- 1. Medicare annual wellness visit, subsequent General health good. Has cervical and left shoulder arthritis causing discomfort and stiffness. Right ankle fused secondary to crush injury in 1960. On 09-30-11, x-rays of neck and left shoulder showed arthritic spurring and disc disease. Refuses flu shot today. Wants to check with insurance and local  pharmacist for tetanus, shingles and pneumonia vaccinations.   2. Essential hypertension Stable and well controlled. Continue Coreg, amlodipine and Vasotec for hypertension. Schedule follow up labs in the future for follow up appointment. - CBC with Differential/Platelet; Future - Comprehensive metabolic panel; Future - TSH; Future  3. Coronary artery disease involving native coronary artery of native heart with angina pectoris with documented spasm ([place]) History of MI in 2000 and NSTEMI 03-19-16. Normal LV function and EF 55-60% on echocardiogram. Followed by Dr. Rockey Situ (cardiologist)  4. Esophagitis, reflux Well controlled with use of Omeprazole 40 mg qd. Will recheck labs with follow up appointment in the near future. - CBC with Differential/Platelet; Future  5. Mixed hyperlipidemia Schedule follow up and get routine labs.  - Comprehensive metabolic panel; Future - Lipid panel; Future - TSH; Future  6. Prostate enlargement 2-3+ enlarged gland with some difficulty urinating. Schedule recheck of PSA in the near future. - PSA; Future    Vernie Murders, PA  [facility]

## 2016-06-23 ENCOUNTER — Other Ambulatory Visit: Payer: Self-pay | Admitting: Family Medicine

## 2016-06-23 DIAGNOSIS — E782 Mixed hyperlipidemia: Secondary | ICD-10-CM | POA: Diagnosis not present

## 2016-06-23 DIAGNOSIS — K210 Gastro-esophageal reflux disease with esophagitis: Secondary | ICD-10-CM | POA: Diagnosis not present

## 2016-06-23 DIAGNOSIS — I10 Essential (primary) hypertension: Secondary | ICD-10-CM | POA: Diagnosis not present

## 2016-06-23 DIAGNOSIS — N400 Enlarged prostate without lower urinary tract symptoms: Secondary | ICD-10-CM | POA: Diagnosis not present

## 2016-06-24 ENCOUNTER — Telehealth: Payer: Self-pay

## 2016-06-24 LAB — COMPREHENSIVE METABOLIC PANEL
A/G RATIO: 1.6 (ref 1.2–2.2)
ALBUMIN: 4.2 g/dL (ref 3.5–4.8)
ALT: 10 IU/L (ref 0–44)
AST: 11 IU/L (ref 0–40)
Alkaline Phosphatase: 83 IU/L (ref 39–117)
BUN / CREAT RATIO: 16 (ref 10–24)
BUN: 16 mg/dL (ref 8–27)
Bilirubin Total: 0.7 mg/dL (ref 0.0–1.2)
CALCIUM: 9 mg/dL (ref 8.6–10.2)
CO2: 25 mmol/L (ref 18–29)
CREATININE: 1.03 mg/dL (ref 0.76–1.27)
Chloride: 105 mmol/L (ref 96–106)
GFR, EST AFRICAN AMERICAN: 80 mL/min/{1.73_m2} (ref >59)
GFR, EST NON AFRICAN AMERICAN: 69 mL/min/{1.73_m2} (ref >59)
GLOBULIN, TOTAL: 2.6 g/dL (ref 1.5–4.5)
Glucose: 103 mg/dL — ABNORMAL HIGH (ref 65–99)
POTASSIUM: 4.1 mmol/L (ref 3.5–5.2)
SODIUM: 143 mmol/L (ref 134–144)
TOTAL PROTEIN: 6.8 g/dL (ref 6.0–8.5)

## 2016-06-24 LAB — CBC WITH DIFFERENTIAL/PLATELET
BASOS: 1 %
Basophils Absolute: 0 10*3/uL (ref 0.0–0.2)
EOS (ABSOLUTE): 0.1 10*3/uL (ref 0.0–0.4)
EOS: 1 %
HEMATOCRIT: 40.6 % (ref 37.5–51.0)
HEMOGLOBIN: 13.8 g/dL (ref 12.6–17.7)
IMMATURE GRANULOCYTES: 0 %
Immature Grans (Abs): 0 10*3/uL (ref 0.0–0.1)
Lymphocytes Absolute: 1.2 10*3/uL (ref 0.7–3.1)
Lymphs: 28 %
MCH: 30.8 pg (ref 26.6–33.0)
MCHC: 34 g/dL (ref 31.5–35.7)
MCV: 91 fL (ref 79–97)
MONOCYTES: 8 %
MONOS ABS: 0.4 10*3/uL (ref 0.1–0.9)
NEUTROS PCT: 62 %
Neutrophils Absolute: 2.7 10*3/uL (ref 1.4–7.0)
Platelets: 191 10*3/uL (ref 150–379)
RBC: 4.48 x10E6/uL (ref 4.14–5.80)
RDW: 14 % (ref 12.3–15.4)
WBC: 4.3 10*3/uL (ref 3.4–10.8)

## 2016-06-24 LAB — LIPID PANEL
CHOL/HDL RATIO: 2.9 ratio (ref 0.0–5.0)
Cholesterol, Total: 123 mg/dL (ref 100–199)
HDL: 42 mg/dL (ref >39)
LDL CALC: 63 mg/dL (ref 0–99)
Triglycerides: 91 mg/dL (ref 0–149)
VLDL Cholesterol Cal: 18 mg/dL (ref 5–40)

## 2016-06-24 LAB — TSH: TSH: 4.36 u[IU]/mL (ref 0.450–4.500)

## 2016-06-24 LAB — PSA: PROSTATE SPECIFIC AG, SERUM: 2.9 ng/mL (ref 0.0–4.0)

## 2016-06-24 NOTE — Telephone Encounter (Signed)
Patient advised.

## 2016-06-24 NOTE — Telephone Encounter (Signed)
-----   Message from [name], [place] sent at 06/24/2016 10:48 AM EST ----- All blood tests are normal. Continue present medications and recheck in 6 months.

## 2016-07-15 ENCOUNTER — Ambulatory Visit (INDEPENDENT_AMBULATORY_CARE_PROVIDER_SITE_OTHER): Payer: Medicare Other | Admitting: Cardiovascular Disease

## 2016-07-15 ENCOUNTER — Encounter: Payer: Self-pay | Admitting: Cardiovascular Disease

## 2016-07-15 VITALS — BP 138/72 | HR 54 | Ht 70.0 in | Wt 179.0 lb

## 2016-07-15 DIAGNOSIS — I351 Nonrheumatic aortic (valve) insufficiency: Secondary | ICD-10-CM | POA: Diagnosis not present

## 2016-07-15 DIAGNOSIS — I10 Essential (primary) hypertension: Secondary | ICD-10-CM

## 2016-07-15 DIAGNOSIS — I2510 Atherosclerotic heart disease of native coronary artery without angina pectoris: Secondary | ICD-10-CM

## 2016-07-15 DIAGNOSIS — E7800 Pure hypercholesterolemia, unspecified: Secondary | ICD-10-CM | POA: Diagnosis not present

## 2016-07-15 MED ORDER — AMLODIPINE BESYLATE 5 MG PO TABS: 5.0000 mg | ORAL_TABLET | Freq: Every evening | ORAL | 5 refills | Status: DC

## 2016-07-15 NOTE — Patient Instructions (Signed)
Medication Instructions:  Your physician has recommended you make the following change in your medication:  DECREASE amlodipine to 5mg  once daily   Labwork: none  Testing/Procedures: none  Follow-Up: Your physician wants you to follow-up in: 6 months with Dr. Fletcher Anon.  You will receive a reminder letter in the mail two months in advance. If you don't receive a letter, please call our office to schedule the follow-up appointment.   Any Other Special Instructions Will Be Listed Below (If Applicable).     If you need a refill on your cardiac medications before your next appointment, please call your pharmacy.

## 2016-07-15 NOTE — Progress Notes (Signed)
Cardiology Office Note   Date:  07/15/2016   ID:  James Mora, DOB 01-Oct-1935, MRN [id number]  PCP:  Vernie Murders, PA  Cardiologist:   [name], MD   Chief Complaint  Patient presents with  . other    31mo f/u. Pt states he is doing well. Reviewed meds with pt verbally.      History of Present Illness: ESAU WILKIN is a 80 y.o. male who presents for a follow-up visit regarding Coronary artery disease. Cardiac catheterization in 2000 showed severe distal LAD disease with left to left collaterals which has been managed medically.  He had a small non-ST elevation myocardial infarction in August. Cardiac catheterization was performed which showed only mild proximal LAD disease. No culprit was identified. I reviewed his cardiac catheterization today and I could not exclude the possibility of an occlusion of a small right PL 3. His ejection fraction was normal. Echocardiogram showed normal LV systolic function with stable mild to moderate aortic regurgitation. He has been doing extremely well and denies any chest pain or shortness of breath. His biggest orsening bilateral leg edema. Amlodipine was increased to 10 mg daily during last visit.Marland Kitchen   Past Medical History:  Diagnosis Date  . Acid reflux   . Aortic insufficiency    a. 03/2016 Echo: EF 55-60%, no rwma, Gr1 DD, mild to mod AI, mild TR.  Marland Kitchen CAD (coronary artery disease)    a. 2000 Cath: Severe distal LAD disease with left to left collaterals;  b. 2008 MV: non-ischemic; c. 03/2016 Cath: LM nl, LAD 30p, D1/D2 mod, nl, LCX nl, OM1/2 nl w/ L-->L collats to apical LAD, RCA dominant, nl. EF 55-65%.  . Hypertensive heart disease 2005   unspecified  . Mixed hyperlipidemia    mixed    Past Surgical History:  Procedure Laterality Date  . CARDIAC CATHETERIZATION  2000   left heart  . CARDIAC CATHETERIZATION N/A 03/22/2016   Procedure: Left Heart Cath And Coronary Angiography;  Surgeon: Minna Merritts, MD;  Location: [facility];  Service: Cardiovascular;  Laterality: N/A;  . COLONOSCOPY  2008   Dr. Allen Norris     Current Outpatient Prescriptions  Medication Sig Dispense Refill  . amLODipine (NORVASC) 10 MG tablet Take 1 tablet (10 mg total) by mouth every evening. 30 tablet 3  . aspirin 81 MG tablet Take 81 mg by mouth daily.    Marland Kitchen atorvastatin (LIPITOR) 20 MG tablet Take 20 mg by mouth daily at 6 PM.    . carvedilol (COREG) 12.5 MG tablet take 1 tablet by mouth twice a day 60 tablet 3  . enalapril (VASOTEC) 20 MG tablet take 1 tablet by mouth once daily 30 tablet 3  . nitroGLYCERIN (NITROSTAT) 0.4 MG SL tablet Place 0.4 mg under the tongue every 5 (five) minutes as needed.      Marland Kitchen omeprazole (PRILOSEC) 20 MG capsule Take 2 capsules (40 mg total) by mouth daily. 60 capsule 6   No current facility-administered medications for this visit.     Allergies:   Patient has no known allergies.    Social History:  The patient  reports that he has never smoked. He has never used smokeless tobacco. He reports that he does not drink alcohol or use drugs.   Family History:  The patient's Family history is unknown by patient.      PHYSICAL EXAM: VS:  BP 138/72 (BP Location: Left Arm, Patient Position: Sitting, Cuff Size: Normal)  Pulse (!) 54   Ht 5\' 10"  (1.778 m)   Wt 179 lb (81.2 kg)   BMI 25.68 kg/m  , BMI Body mass index is 25.68 kg/m. GEN: Well nourished, well developed, in no acute distress  HEENT: normal  Neck: no JVD, carotid bruits, or masses Cardiac: RRR; no murmurs, rubs, or gallops,+1 edema edema  Respiratory:  clear to auscultation bilaterally, normal work of breathing GI: soft, nontender, nondistended, + BS MS: no deformity or atrophy  Skin: warm and dry, no rash Neuro:  Strength and sensation are intact Psych: euthymic mood, full affect   EKG:  EKG is ordered today. The ekg ordered today demonstrates : sinus bradycardia with right bundle branch block. Minimal LVH. No significant  change from recent EKG   Recent Labs: 03/22/2016: Hemoglobin 14.2 06/23/2016: ALT 10; BUN 16; Creatinine, Ser 1.03; Platelets 191; Potassium 4.1; Sodium 143; TSH 4.360    Lipid Panel    Component Value Date/Time   CHOL 123 06/23/2016 0940   TRIG 91 06/23/2016 0940   HDL 42 06/23/2016 0940   CHOLHDL 2.9 06/23/2016 0940   CHOLHDL 3.3 Ratio 12/20/2008 2241   VLDL 14 12/20/2008 2241   LDLCALC 63 06/23/2016 0940      Wt Readings from Last 3 Encounters:  07/15/16 179 lb (81.2 kg)  06/18/16 184 lb (83.5 kg)  04/08/16 182 lb 4 oz (82.7 kg)         ASSESSMENT AND PLAN:  1.  Coronary artery disease involving native coronary arteries  Without angina: He is doing well after a small non-ST elevation myocardial infarction in August. A small PL 3 might have been occluded as the culprit. Ejection fraction was normal. Otherwise no evidence of obstructive disease. Continue medical therapy.  2. Aortic valve disorder: Recent echocardiogram showed mild to moderate aortic insufficiency. He has no cardiac murmurs.  3. Essential hypertension:  Blood pressure is well controlled on current medications. However, he has significant worsening of leg edema likely due to increased dose amlodipine. I decreased the dose back to 5 mg once daily. If blood pressure starts going up, I recommend switching amlodipine to a thiazide diuretic.  4. Hyperlipidemia:  I reviewed most recent lipid profile which showed an LDL of 63. Continue treatment with atorvastatin.  Disposition:   FU with me in 6 months.  Signed,  [name], MD  07/15/2016 8:52 AM    [facility]

## 2016-07-17 ENCOUNTER — Other Ambulatory Visit: Payer: Self-pay | Admitting: Physician Assistant

## 2016-07-17 DIAGNOSIS — E7800 Pure hypercholesterolemia, unspecified: Secondary | ICD-10-CM

## 2016-07-30 ENCOUNTER — Other Ambulatory Visit: Payer: Self-pay | Admitting: Cardiovascular Disease

## 2016-08-25 ENCOUNTER — Other Ambulatory Visit: Payer: Self-pay | Admitting: Cardiovascular Disease

## 2016-08-27 ENCOUNTER — Other Ambulatory Visit: Payer: Self-pay | Admitting: *Deleted

## 2016-08-27 MED ORDER — ENALAPRIL MALEATE 20 MG PO TABS: 20.0000 mg | ORAL_TABLET | Freq: Every day | ORAL | 3 refills | Status: DC

## 2016-10-08 ENCOUNTER — Other Ambulatory Visit: Payer: Self-pay | Admitting: Family Medicine

## 2016-10-08 DIAGNOSIS — K219 Gastro-esophageal reflux disease without esophagitis: Secondary | ICD-10-CM

## 2016-12-16 ENCOUNTER — Ambulatory Visit (INDEPENDENT_AMBULATORY_CARE_PROVIDER_SITE_OTHER): Payer: Medicare Other | Admitting: Family Medicine

## 2016-12-16 ENCOUNTER — Encounter: Payer: Self-pay | Admitting: Family Medicine

## 2016-12-16 VITALS — BP 132/74 | HR 52 | Temp 97.9°F | Wt 178.2 lb

## 2016-12-16 DIAGNOSIS — E7800 Pure hypercholesterolemia, unspecified: Secondary | ICD-10-CM

## 2016-12-16 DIAGNOSIS — K2100 Gastro-esophageal reflux disease with esophagitis, without bleeding: Secondary | ICD-10-CM

## 2016-12-16 DIAGNOSIS — I10 Essential (primary) hypertension: Secondary | ICD-10-CM | POA: Diagnosis not present

## 2016-12-16 DIAGNOSIS — I25119 Atherosclerotic heart disease of native coronary artery with unspecified angina pectoris: Secondary | ICD-10-CM | POA: Diagnosis not present

## 2016-12-16 NOTE — Progress Notes (Signed)
Patient: James Mora Male    DOB: May 10, 1936   81 y.o.   MRN: [id number] Visit Date: 12/16/2016  Today's Provider: Vernie Murders, PA   Chief Complaint  Patient presents with  . Hypertension  . Hyperlipidemia  . Gastroesophageal Reflux  . Follow-up   Subjective:    HPI  Hypertension, follow-up:  BP Readings from Last 3 Encounters:  12/16/16 132/74  07/15/16 138/72  06/18/16 120/64    He was last seen for hypertension 6 months ago.  BP at that visit was 120/64. Management since that visit includes continue medications.He reports good compliance with treatment. He is not having side effects.  He is not exercising regularly just doing yardwork. He is adherent to low salt diet.   Outside blood pressures are not being checked. He is experiencing none.  Patient denies chest pain, chest pressure/discomfort, irregular heart beat and palpitations.   Cardiovascular risk factors include advanced age (older than 64 for men, 68 for women), dyslipidemia, hypertension and male gender.  Use of agents associated with hypertension: none.   ------------------------------------------------------------------------    Lipid/Cholesterol, Follow-up:   Last seen for this 6 months ago.  Management since that visit includes continue medication.  Last Lipid Panel:    Component Value Date/Time   CHOL 123 06/23/2016 0940   TRIG 91 06/23/2016 0940   HDL 42 06/23/2016 0940   CHOLHDL 2.9 06/23/2016 0940   CHOLHDL 3.3 Ratio 12/20/2008 2241   VLDL 14 12/20/2008 2241   LDLCALC 63 06/23/2016 0940    He reports good compliance with treatment. He is not having side effects.   Wt Readings from Last 3 Encounters:  12/16/16 178 lb 3.2 oz (80.8 kg)  07/15/16 179 lb (81.2 kg)  06/18/16 184 lb (83.5 kg)    ------------------------------------------------------------------------  GERD, Follow up:  The patient was last seen for GERD 6 months ago. Changes made since that visit include  continue medication.  He reports good compliance with treatment. He is not having side effects. Marland Kitchen  He is NOT experiencing abdominal bloating, belching, belching and eructation, bilious reflux, chest pain, choking on food, cough, deep pressure at base of neck, difficulty swallowing, dysphagia, early satiety, fullness after meals, heartburn, hematemesis, hoarseness, laryngitis, melena, midespigastric pain or nausea  ------------------------------------------------------------------------  Patient Active Problem List   Diagnosis Date Noted  . Narrowing of intervertebral disc space 06/18/2016  . Elevated prostate specific antigen (PSA) 06/18/2016  . Benign fibroma of prostate 06/18/2016  . HLD (hyperlipidemia)   . Mixed hyperlipidemia   . CAD (coronary artery disease)   . Coronary artery disease involving native coronary artery of native heart with angina pectoris with documented spasm ([place])   . NSTEMI (non-ST elevated myocardial infarction) ([place]) 03/19/2016  . HTN (hypertension)   . High blood cholesterol level   . Acid reflux   . Hyperlipidemia 12/27/2008  . Essential hypertension 12/27/2008  . Coronary atherosclerosis 12/27/2008  . Aortic valve disorder 12/27/2008  . Esophagitis, reflux 06/06/2006  . Hypertensive heart disease 08/10/2003   Past Surgical History:  Procedure Laterality Date  . CARDIAC CATHETERIZATION  2000   left heart  . CARDIAC CATHETERIZATION N/A 03/22/2016   Procedure: Left Heart Cath And Coronary Angiography;  Surgeon: Minna Merritts, MD;  Location: [facility];  Service: Cardiovascular;  Laterality: N/A;  . COLONOSCOPY  2008   Dr. Allen Norris   Family History  Problem Relation Age of Onset  . Family history unknown: Yes   No Known Allergies  Previous Medications   AMLODIPINE (NORVASC) 5 MG TABLET    Take 1 tablet (5 mg total) by mouth every evening.   ASPIRIN 81 MG TABLET    Take 81 mg by mouth daily.   ATORVASTATIN (LIPITOR) 20 MG TABLET    take  1 tablet by mouth once daily   CARVEDILOL (COREG) 12.5 MG TABLET    take 1 tablet by mouth twice a day   ENALAPRIL (VASOTEC) 20 MG TABLET    Take 1 tablet (20 mg total) by mouth daily.   NITROGLYCERIN (NITROSTAT) 0.4 MG SL TABLET    Place 0.4 mg under the tongue every 5 (five) minutes as needed.     OMEPRAZOLE (PRILOSEC) 20 MG CAPSULE    take 2 capsules by mouth daily    Review of Systems  Constitutional: Negative.   Respiratory: Negative.   Cardiovascular: Negative.   Gastrointestinal: Negative.   Musculoskeletal: Negative.     Social History  Substance Use Topics  . Smoking status: Never Smoker  . Smokeless tobacco: Never Used  . Alcohol use No   Objective:   BP 132/74 (BP Location: Right Arm, Patient Position: Sitting, Cuff Size: Normal)   Pulse (!) 52   Temp 97.9 F (36.6 C) (Oral)   Wt 178 lb 3.2 oz (80.8 kg)   SpO2 98%   BMI 25.57 kg/m   Physical Exam  Constitutional: He is oriented to person, place, and time. He appears well-developed and well-nourished. No distress.  HENT:  Head: Normocephalic and atraumatic.  Right Ear: Hearing and external ear normal.  Left Ear: Hearing and external ear normal.  Nose: Nose normal.  Eyes: Conjunctivae and lids are normal. Right eye exhibits no discharge. Left eye exhibits no discharge. No scleral icterus.  Neck: Neck supple.  Cardiovascular: Normal rate and regular rhythm.   Pulmonary/Chest: Effort normal and breath sounds normal. No respiratory distress.  Abdominal: Soft. Bowel sounds are normal.  Musculoskeletal: Normal range of motion.  Neurological: He is alert and oriented to person, place, and time.  Skin: Skin is intact. No lesion and no rash noted.  Psychiatric: He has a normal mood and affect. His speech is normal and behavior is normal. Thought content normal.      Assessment & Plan:     1. Essential hypertension Stable and well controlled BP with use of Carvedilol, Enalapril and Amlodipine. Feels he is not  having significant peripheral edema. Cardiologist (Dr. Fletcher Anon) recommended switch to thiazide diuretic if this persisted. Recheck labs. Follow up pending reports. Will plan annual wellness exam in November 2018. - CBC with Differential/Platelet - Comprehensive metabolic panel  2. Pure hypercholesterolemia Well controlled on the Atorvastatin. Denies arthralgias or myalgias. Continue low fat diet. Has lost 1 more lb since last follow up with cardiologist 07-15-17. Recheck CMP and lipid panel. Follow up pending reports. - Comprehensive metabolic panel - Lipid panel  3. Coronary artery disease involving native coronary artery of native heart with angina pectoris ([place]) Denies chest pains or dyspnea. Has not had to use Nitroglycerin in the past year. Is scheduled for cardiology follow up with Dr. Fletcher Anon in July 2018. Recheck labs and follow up prn. - CBC with Differential/Platelet - Lipid panel  4. Esophagitis, reflux Tolerating Omeprazole without side effects. Continues to control acid reflux symptoms and no melena or hematemesis. If he stops it for more than a day, dyspepsia will return. Will recheck labs. Discussed possible step down to H2 blocker and possible GI evaluation in the near future.  -  CBC with Differential/Platelet

## 2016-12-17 ENCOUNTER — Telehealth: Payer: Self-pay

## 2016-12-17 LAB — CBC WITH DIFFERENTIAL/PLATELET
BASOS: 0 %
Basophils Absolute: 0 10*3/uL (ref 0.0–0.2)
EOS (ABSOLUTE): 0 10*3/uL (ref 0.0–0.4)
Eos: 1 %
HEMOGLOBIN: 14.2 g/dL (ref 13.0–17.7)
Hematocrit: 43.7 % (ref 37.5–51.0)
IMMATURE GRANS (ABS): 0 10*3/uL (ref 0.0–0.1)
Immature Granulocytes: 0 %
LYMPHS ABS: 1.5 10*3/uL (ref 0.7–3.1)
LYMPHS: 29 %
MCH: 29.9 pg (ref 26.6–33.0)
MCHC: 32.5 g/dL (ref 31.5–35.7)
MCV: 92 fL (ref 79–97)
MONOCYTES: 8 %
Monocytes Absolute: 0.4 10*3/uL (ref 0.1–0.9)
NEUTROS ABS: 3.2 10*3/uL (ref 1.4–7.0)
Neutrophils: 62 %
Platelets: 212 10*3/uL (ref 150–379)
RBC: 4.75 x10E6/uL (ref 4.14–5.80)
RDW: 14.8 % (ref 12.3–15.4)
WBC: 5.1 10*3/uL (ref 3.4–10.8)

## 2016-12-17 LAB — COMPREHENSIVE METABOLIC PANEL
A/G RATIO: 1.7 (ref 1.2–2.2)
ALBUMIN: 4.3 g/dL (ref 3.5–4.8)
ALT: 13 IU/L (ref 0–44)
AST: 14 IU/L (ref 0–40)
Alkaline Phosphatase: 87 IU/L (ref 39–117)
BILIRUBIN TOTAL: 0.8 mg/dL (ref 0.0–1.2)
BUN/Creatinine Ratio: 13 (ref 10–24)
BUN: 14 mg/dL (ref 8–27)
CHLORIDE: 108 mmol/L — AB (ref 96–106)
CO2: 24 mmol/L (ref 18–29)
Calcium: 9.4 mg/dL (ref 8.6–10.2)
Creatinine, Ser: 1.09 mg/dL (ref 0.76–1.27)
GFR calc Af Amer: 75 mL/min/{1.73_m2} (ref >59)
GFR, EST NON AFRICAN AMERICAN: 65 mL/min/{1.73_m2} (ref >59)
Globulin, Total: 2.6 g/dL (ref 1.5–4.5)
Glucose: 110 mg/dL — ABNORMAL HIGH (ref 65–99)
POTASSIUM: 4.7 mmol/L (ref 3.5–5.2)
SODIUM: 145 mmol/L — AB (ref 134–144)
Total Protein: 6.9 g/dL (ref 6.0–8.5)

## 2016-12-17 LAB — LIPID PANEL
CHOL/HDL RATIO: 2.8 ratio (ref 0.0–5.0)
Cholesterol, Total: 119 mg/dL (ref 100–199)
HDL: 43 mg/dL (ref >39)
LDL Calculated: 58 mg/dL (ref 0–99)
TRIGLYCERIDES: 88 mg/dL (ref 0–149)
VLDL Cholesterol Cal: 18 mg/dL (ref 5–40)

## 2016-12-17 NOTE — Telephone Encounter (Signed)
Patient advised.

## 2016-12-17 NOTE — Telephone Encounter (Signed)
-----   Message from [name], [place] sent at 12/17/2016  8:09 AM EDT ----- Cholesterol and all blood tests essentially normal except salt level up a little. Drinking extra water will correct this. Continue to restrict salt in diet. Recheck for CPE in November.

## 2016-12-19 ENCOUNTER — Other Ambulatory Visit: Payer: Self-pay | Admitting: Cardiovascular Disease

## 2017-01-07 ENCOUNTER — Other Ambulatory Visit: Payer: Self-pay | Admitting: Cardiovascular Disease

## 2017-01-27 ENCOUNTER — Other Ambulatory Visit: Payer: Self-pay | Admitting: Family Medicine

## 2017-01-27 DIAGNOSIS — E7800 Pure hypercholesterolemia, unspecified: Secondary | ICD-10-CM

## 2017-02-17 ENCOUNTER — Encounter: Payer: Self-pay | Admitting: Cardiovascular Disease

## 2017-02-17 ENCOUNTER — Ambulatory Visit (INDEPENDENT_AMBULATORY_CARE_PROVIDER_SITE_OTHER): Payer: Medicare Other | Admitting: Cardiovascular Disease

## 2017-02-17 VITALS — BP 152/72 | HR 57 | Resp 18 | Ht 70.0 in | Wt 173.5 lb

## 2017-02-17 DIAGNOSIS — L97911 Non-pressure chronic ulcer of unspecified part of right lower leg limited to breakdown of skin: Secondary | ICD-10-CM

## 2017-02-17 DIAGNOSIS — E7800 Pure hypercholesterolemia, unspecified: Secondary | ICD-10-CM | POA: Diagnosis not present

## 2017-02-17 DIAGNOSIS — I2510 Atherosclerotic heart disease of native coronary artery without angina pectoris: Secondary | ICD-10-CM

## 2017-02-17 DIAGNOSIS — I10 Essential (primary) hypertension: Secondary | ICD-10-CM | POA: Diagnosis not present

## 2017-02-17 MED ORDER — CHLORTHALIDONE 25 MG PO TABS: 25.0000 mg | ORAL_TABLET | Freq: Every day | ORAL | 3 refills | Status: DC

## 2017-02-17 NOTE — Patient Instructions (Signed)
Medication Instructions:  Your physician has recommended you make the following change in your medication:  STOP taking amlodipine START taking chlorthalidone 25mg  once daily   Labwork: BMET in 1-2 weeks at the [facility] outpatient lab. No appointment necessary  Testing/Procedures: Your physician has requested that you have a lower extremity arterial  duplex. During this test,  ultrasound is used to evaluate arterial blood flow in the legs. Allow one hour for this exam. There are no restrictions or special instructions.  Follow-Up: Your physician wants you to follow-up in: 6 months with Dr. Fletcher Anon.  You will receive a reminder letter in the mail two months in advance. If you don't receive a letter, please call our office to schedule the follow-up appointment.   Any Other Special Instructions Will Be Listed Below (If Applicable).     If you need a refill on your cardiac medications before your next appointment, please call your pharmacy.

## 2017-02-17 NOTE — Progress Notes (Signed)
Cardiology Office Note   Date:  02/17/2017   ID:  James Mora, DOB 10-22-1935, MRN [id number]  PCP:  [name], PA  Cardiologist:   [name], MD   Chief Complaint  Patient presents with  . other    1 yr f/u . Meds reviewed verbally with pt. "Doing well"      History of Present Illness: James Mora is a 81 y.o. male who presents for a follow-up visit regarding Coronary artery disease. Cardiac catheterization in 2000 showed severe distal LAD disease with left to left collaterals which has been managed medically.  He had a small non-ST elevation myocardial infarction in August, 2017. Cardiac catheterization was performed which showed only mild proximal LAD disease And possible occlusion of a small PL 3.  His ejection fraction was normal. Echocardiogram showed normal LV systolic function with stable mild to moderate aortic regurgitation. He has been doing extremely well and denies any chest pain or shortness of breath.  He continues to have issues with bilateral leg edema worse on the right side and blood pressure continues to be elevated. He has small ulceration affecting the right ankle. No significant claudication.   Past Medical History:  Diagnosis Date  . Acid reflux   . Aortic insufficiency    a. 03/2016 Echo: EF 55-60%, no rwma, Gr1 DD, mild to mod AI, mild TR.  Marland Kitchen CAD (coronary artery disease)    a. 2000 Cath: Severe distal LAD disease with left to left collaterals;  b. 2008 MV: non-ischemic; c. 03/2016 Cath: LM nl, LAD 30p, D1/D2 mod, nl, LCX nl, OM1/2 nl w/ L-->L collats to apical LAD, RCA dominant, nl. EF 55-65%.  . Hypertensive heart disease 2005   unspecified  . Mixed hyperlipidemia    mixed    Past Surgical History:  Procedure Laterality Date  . CARDIAC CATHETERIZATION  2000   left heart  . CARDIAC CATHETERIZATION N/A 03/22/2016   Procedure: Left Heart Cath And Coronary Angiography;  Surgeon: Minna Merritts, MD;  Location: [facility];  Service: Cardiovascular;  Laterality: N/A;  . COLONOSCOPY  2008   Dr. Allen Norris     Current Outpatient Prescriptions  Medication Sig Dispense Refill  . amLODipine (NORVASC) 5 MG tablet take 1 tablet by mouth every evening 30 tablet 1  . aspirin 81 MG tablet Take 81 mg by mouth daily.    Marland Kitchen atorvastatin (LIPITOR) 20 MG tablet take 1 tablet by mouth once daily 30 tablet 6  . carvedilol (COREG) 12.5 MG tablet take 1 tablet by mouth twice a day 180 tablet 3  . enalapril (VASOTEC) 20 MG tablet take 1 tablet by mouth once daily 30 tablet 3  . nitroGLYCERIN (NITROSTAT) 0.4 MG SL tablet Place 0.4 mg under the tongue every 5 (five) minutes as needed.      Marland Kitchen omeprazole (PRILOSEC) 20 MG capsule take 2 capsules by mouth daily 60 capsule 6   No current facility-administered medications for this visit.     Allergies:   Patient has no known allergies.    Social History:  The patient  reports that he has never smoked. He has never used smokeless tobacco. He reports that he does not drink alcohol or use drugs.   Family History:  The patient's Family history is unknown by patient.      PHYSICAL EXAM: VS:  BP (!) 152/72 (BP Location: Left Arm, Patient Position: Sitting, Cuff Size: Normal)   Pulse (!) 57  Resp 18   Ht 5\' 10"  (1.778 m)   Wt 173 lb 8 oz (78.7 kg)   BMI 24.89 kg/m  , BMI Body mass index is 24.89 kg/m. GEN: Well nourished, well developed, in no acute distress  HEENT: normal  Neck: no JVD, carotid bruits, or masses Cardiac: RRR; no murmurs, rubs, or gallops,+1 edema edema  Respiratory:  clear to auscultation bilaterally, normal work of breathing GI: soft, nontender, nondistended, + BS MS: no deformity or atrophy  Skin: warm and dry, Chronic stasis dermatitis Neuro:  Strength and sensation are intact Psych: euthymic mood, full affect Small superficial ulceration at the medial right ankle. Distal pulses are diminished.   EKG:  EKG is ordered today. The ekg ordered today  demonstrates Sinus bradycardia with right bundle branch block.  Recent Labs: 06/23/2016: TSH 4.360 12/16/2016: ALT 13; BUN 14; Creatinine, Ser 1.09; Hemoglobin 14.2; Platelets 212; Potassium 4.7; Sodium 145    Lipid Panel    Component Value Date/Time   CHOL 119 12/16/2016 0955   TRIG 88 12/16/2016 0955   HDL 43 12/16/2016 0955   CHOLHDL 2.8 12/16/2016 0955   CHOLHDL 3.3 Ratio 12/20/2008 2241   VLDL 14 12/20/2008 2241   LDLCALC 58 12/16/2016 0955      Wt Readings from Last 3 Encounters:  02/17/17 173 lb 8 oz (78.7 kg)  12/16/16 178 lb 3.2 oz (80.8 kg)  07/15/16 179 lb (81.2 kg)         ASSESSMENT AND PLAN:  1.  Coronary artery disease involving native coronary arteries without angina: Continue medical therapy.  2. Aortic valve disorder: Recent echocardiogram showed mild to moderate aortic insufficiency. He has no cardiac murmurs. Consider repeat echocardiogram next year.  3. Essential hypertension:   Blood pressure continues to be uncontrolled and he has significant leg edema. Thus, I elected to discontinue amlodipine and start chlorthalidone. Check basic metabolic profile in one week.  4. Hyperlipidemia:  I reviewed most recent lipid profile which showed an LDL of 58 which is at target. Continue treatment with atorvastatin.  5. Chronic venous insufficiency with ulceration on the right lower extremity: I suspect this is likely venous ulceration but given his risk factors, I requested lower extremity arterial Doppler to make sure he does not have peripheral arterial disease. I'm hoping that the ulceration will improve after improving leg edema.   Disposition:   FU with me in 6 months.  Signed,  [name], MD  02/17/2017 10:56 AM    [place]

## 2017-02-21 ENCOUNTER — Other Ambulatory Visit
Admission: RE | Admit: 2017-02-21 | Discharge: 2017-02-21 | Disposition: A | Discharge status: Home / Self Care | Payer: Medicare Other | Source: Ambulatory Visit | Attending: Cardiovascular Disease | Admitting: Cardiovascular Disease

## 2017-02-21 DIAGNOSIS — I10 Essential (primary) hypertension: Secondary | ICD-10-CM | POA: Insufficient documentation

## 2017-02-21 LAB — BASIC METABOLIC PANEL
Anion gap: 8 (ref 5–15)
BUN: 25 mg/dL — AB (ref 6–20)
CALCIUM: 9.2 mg/dL (ref 8.9–10.3)
CO2: 26 mmol/L (ref 22–32)
CREATININE: 1.28 mg/dL — AB (ref 0.61–1.24)
Chloride: 108 mmol/L (ref 101–111)
GFR calc Af Amer: >60 mL/min (ref >60)
GFR, EST NON AFRICAN AMERICAN: 52 mL/min — AB (ref >60)
Glucose, Bld: 120 mg/dL — ABNORMAL HIGH (ref 65–99)
POTASSIUM: 3.9 mmol/L (ref 3.5–5.1)
SODIUM: 142 mmol/L (ref 135–145)

## 2017-02-24 ENCOUNTER — Other Ambulatory Visit: Payer: Self-pay | Admitting: *Deleted

## 2017-02-24 DIAGNOSIS — I10 Essential (primary) hypertension: Secondary | ICD-10-CM

## 2017-03-09 ENCOUNTER — Other Ambulatory Visit: Payer: Self-pay | Admitting: Cardiovascular Disease

## 2017-03-09 DIAGNOSIS — L98491 Non-pressure chronic ulcer of skin of other sites limited to breakdown of skin: Secondary | ICD-10-CM

## 2017-03-10 ENCOUNTER — Other Ambulatory Visit: Payer: Self-pay | Admitting: *Deleted

## 2017-03-10 ENCOUNTER — Other Ambulatory Visit
Admission: RE | Admit: 2017-03-10 | Discharge: 2017-03-10 | Disposition: A | Discharge status: Home / Self Care | Payer: Medicare Other | Source: Ambulatory Visit | Attending: Cardiovascular Disease | Admitting: Cardiovascular Disease

## 2017-03-10 DIAGNOSIS — R7989 Other specified abnormal findings of blood chemistry: Secondary | ICD-10-CM

## 2017-03-10 DIAGNOSIS — I10 Essential (primary) hypertension: Secondary | ICD-10-CM | POA: Diagnosis not present

## 2017-03-10 DIAGNOSIS — Z79899 Other long term (current) drug therapy: Secondary | ICD-10-CM

## 2017-03-10 LAB — BASIC METABOLIC PANEL
Anion gap: 6 (ref 5–15)
BUN: 23 mg/dL — AB (ref 6–20)
CALCIUM: 9.4 mg/dL (ref 8.9–10.3)
CO2: 25 mmol/L (ref 22–32)
CREATININE: 1.38 mg/dL — AB (ref 0.61–1.24)
Chloride: 108 mmol/L (ref 101–111)
GFR calc Af Amer: 55 mL/min — ABNORMAL LOW (ref >60)
GFR calc non Af Amer: 47 mL/min — ABNORMAL LOW (ref >60)
Glucose, Bld: 113 mg/dL — ABNORMAL HIGH (ref 65–99)
Potassium: 4.2 mmol/L (ref 3.5–5.1)
SODIUM: 139 mmol/L (ref 135–145)

## 2017-03-18 ENCOUNTER — Other Ambulatory Visit: Payer: Medicare Other

## 2017-03-18 ENCOUNTER — Ambulatory Visit (INDEPENDENT_AMBULATORY_CARE_PROVIDER_SITE_OTHER): Payer: Medicare Other | Admitting: *Deleted

## 2017-03-18 ENCOUNTER — Ambulatory Visit: Payer: Medicare Other

## 2017-03-18 VITALS — BP 145/73 | HR 51 | Resp 19 | Ht 70.0 in | Wt 168.5 lb

## 2017-03-18 DIAGNOSIS — Z79899 Other long term (current) drug therapy: Secondary | ICD-10-CM

## 2017-03-18 DIAGNOSIS — I10 Essential (primary) hypertension: Secondary | ICD-10-CM

## 2017-03-18 DIAGNOSIS — L97911 Non-pressure chronic ulcer of unspecified part of right lower leg limited to breakdown of skin: Secondary | ICD-10-CM

## 2017-03-18 DIAGNOSIS — R7989 Other specified abnormal findings of blood chemistry: Secondary | ICD-10-CM | POA: Diagnosis not present

## 2017-03-18 DIAGNOSIS — L98491 Non-pressure chronic ulcer of skin of other sites limited to breakdown of skin: Secondary | ICD-10-CM

## 2017-03-18 NOTE — Patient Instructions (Signed)
Your physician has requested that you regularly monitor and record your blood pressure readings at home. Please use the same machine at the same time of day to check your readings and record them to bring to your follow-up visit.  Please keep log of your blood pressure readings and give [place] a call if they consistently run 140/80 or higher.   Please keep your follow up appointments as scheduled.   It was a pleasure seeing you today here in the office. Please do not hesitate to give [place] a call back if you have any further questions. [place], BSN

## 2017-03-18 NOTE — Progress Notes (Signed)
1.) Reason for visit: Blood pressure change due to medication changes  2.) Name of MD requesting visit: Dr. Fletcher Anon  3.) H&P: Hypertension, CAD, HLD  4.) ROS related to problem: Patient denies any problems since medication changes.  5.) Assessment and plan per MD: Will route visit information to Dr. Fletcher Anon for his review and let him know that someone would call if there are any further recommendations. Instructed patient to continue monitoring blood pressures at home and keep log of those readings. Also informed him to please call our office if they persistently run 140/80 or higher. He verbalized understanding of all instructions with no further questions at this time.

## 2017-03-19 LAB — BASIC METABOLIC PANEL WITH GFR
BUN/Creatinine Ratio: 17 (ref 10–24)
BUN: 27 mg/dL (ref 8–27)
CO2: 21 mmol/L (ref 20–29)
Calcium: 9.3 mg/dL (ref 8.6–10.2)
Chloride: 104 mmol/L (ref 96–106)
Creatinine, Ser: 1.56 mg/dL — ABNORMAL HIGH (ref 0.76–1.27)
GFR calc Af Amer: 48 mL/min/1.73 — ABNORMAL LOW
GFR calc non Af Amer: 42 mL/min/1.73 — ABNORMAL LOW
Glucose: 107 mg/dL — ABNORMAL HIGH (ref 65–99)
Potassium: 4.5 mmol/L (ref 3.5–5.2)
Sodium: 141 mmol/L (ref 134–144)

## 2017-03-21 ENCOUNTER — Other Ambulatory Visit: Payer: Self-pay | Admitting: *Deleted

## 2017-03-21 DIAGNOSIS — R7989 Other specified abnormal findings of blood chemistry: Secondary | ICD-10-CM

## 2017-03-21 DIAGNOSIS — Z79899 Other long term (current) drug therapy: Secondary | ICD-10-CM

## 2017-03-25 ENCOUNTER — Other Ambulatory Visit
Admission: RE | Admit: 2017-03-25 | Discharge: 2017-03-25 | Disposition: A | Discharge status: Home / Self Care | Payer: Medicare Other | Source: Ambulatory Visit | Attending: Nurse Practitioner | Admitting: Nurse Practitioner

## 2017-03-25 DIAGNOSIS — R7989 Other specified abnormal findings of blood chemistry: Secondary | ICD-10-CM | POA: Diagnosis not present

## 2017-03-25 DIAGNOSIS — Z79899 Other long term (current) drug therapy: Secondary | ICD-10-CM | POA: Insufficient documentation

## 2017-03-25 LAB — BASIC METABOLIC PANEL
Anion gap: 6 (ref 5–15)
BUN: 21 mg/dL — ABNORMAL HIGH (ref 6–20)
CALCIUM: 9.2 mg/dL (ref 8.9–10.3)
CO2: 25 mmol/L (ref 22–32)
CREATININE: 1.36 mg/dL — AB (ref 0.61–1.24)
Chloride: 107 mmol/L (ref 101–111)
GFR calc Af Amer: 56 mL/min — ABNORMAL LOW (ref >60)
GFR calc non Af Amer: 48 mL/min — ABNORMAL LOW (ref >60)
GLUCOSE: 111 mg/dL — AB (ref 65–99)
Potassium: 4.6 mmol/L (ref 3.5–5.1)
Sodium: 138 mmol/L (ref 135–145)

## 2017-04-12 ENCOUNTER — Other Ambulatory Visit: Payer: Self-pay | Admitting: Cardiovascular Disease

## 2017-04-20 ENCOUNTER — Other Ambulatory Visit: Payer: Self-pay | Admitting: Family Medicine

## 2017-04-20 DIAGNOSIS — K219 Gastro-esophageal reflux disease without esophagitis: Secondary | ICD-10-CM

## 2017-04-20 NOTE — Telephone Encounter (Signed)
James Mora's patient. He is worried that he will not be able to get his medication tomorrow because of the weather. Could you refill his Omeprazole for him so he can pick it up today. Please advise.

## 2017-04-20 NOTE — Telephone Encounter (Signed)
Sent to [facility].

## 2017-04-20 NOTE — Telephone Encounter (Signed)
Patient advised.

## 2017-05-10 ENCOUNTER — Telehealth: Payer: Self-pay | Admitting: Family Medicine

## 2017-05-18 ENCOUNTER — Other Ambulatory Visit: Payer: Self-pay | Admitting: Family Medicine

## 2017-05-18 DIAGNOSIS — K219 Gastro-esophageal reflux disease without esophagitis: Secondary | ICD-10-CM

## 2017-05-18 NOTE — Telephone Encounter (Signed)
Pt contacted office for refill request on the following medications:  omeprazole (PRILOSEC) 20 MG capsule [facility] [address].  Last Rx: 04/20/17 LOV: 12/16/16  Pt stated that he requested the Rx in September when Simona Huh was out of the office and a 30 day supply was sent to the pharmacy with no refills. Pt stated he has 2 days of medication left and he is requesting a new Rx with refills be sent to [facility]. Please advise. Thanks TNP

## 2017-05-19 MED ORDER — OMEPRAZOLE 20 MG PO CPDR: 40.0000 mg | DELAYED_RELEASE_CAPSULE | Freq: Every day | ORAL | 3 refills | Status: DC

## 2017-06-20 ENCOUNTER — Ambulatory Visit (INDEPENDENT_AMBULATORY_CARE_PROVIDER_SITE_OTHER): Payer: Medicare Other | Admitting: Family Medicine

## 2017-06-20 ENCOUNTER — Encounter: Payer: Self-pay | Admitting: Family Medicine

## 2017-06-20 VITALS — BP 132/74 | HR 50 | Temp 97.7°F | Ht 70.0 in | Wt 177.6 lb

## 2017-06-20 DIAGNOSIS — E782 Mixed hyperlipidemia: Secondary | ICD-10-CM

## 2017-06-20 DIAGNOSIS — Z86010 Personal history of colonic polyps: Secondary | ICD-10-CM

## 2017-06-20 DIAGNOSIS — Z0000 Encounter for general adult medical examination without abnormal findings: Secondary | ICD-10-CM | POA: Diagnosis not present

## 2017-06-20 DIAGNOSIS — I10 Essential (primary) hypertension: Secondary | ICD-10-CM

## 2017-06-20 DIAGNOSIS — H6123 Impacted cerumen, bilateral: Secondary | ICD-10-CM | POA: Diagnosis not present

## 2017-06-20 DIAGNOSIS — I25111 Atherosclerotic heart disease of native coronary artery with angina pectoris with documented spasm: Secondary | ICD-10-CM | POA: Diagnosis not present

## 2017-06-20 DIAGNOSIS — N400 Enlarged prostate without lower urinary tract symptoms: Secondary | ICD-10-CM | POA: Diagnosis not present

## 2017-06-20 LAB — LIPID PANEL
CHOLESTEROL: 124 mg/dL (ref <200)
HDL: 43 mg/dL (ref >40)
LDL Cholesterol (Calc): 58 mg/dL (calc)
Non-HDL Cholesterol (Calc): 81 mg/dL (calc) (ref <130)
TRIGLYCERIDES: 148 mg/dL (ref <150)
Total CHOL/HDL Ratio: 2.9 (calc) (ref <5.0)

## 2017-06-20 LAB — COMPLETE METABOLIC PANEL WITH GFR
AG Ratio: 1.5 (calc) (ref 1.0–2.5)
ALT: 10 U/L (ref 9–46)
AST: 11 U/L (ref 10–35)
Albumin: 4.1 g/dL (ref 3.6–5.1)
Alkaline phosphatase (APISO): 63 U/L (ref 40–115)
BUN/Creatinine Ratio: 16 (calc) (ref 6–22)
BUN: 23 mg/dL (ref 7–25)
CALCIUM: 9.5 mg/dL (ref 8.6–10.3)
CO2: 31 mmol/L (ref 20–32)
Chloride: 107 mmol/L (ref 98–110)
Creat: 1.43 mg/dL — ABNORMAL HIGH (ref 0.70–1.18)
GFR, EST AFRICAN AMERICAN: 54 mL/min/{1.73_m2} — AB (ref >=60)
GFR, EST NON AFRICAN AMERICAN: 46 mL/min/{1.73_m2} — AB (ref >=60)
GLUCOSE: 100 mg/dL — AB (ref 65–99)
Globulin: 2.7 g/dL (calc) (ref 1.9–3.7)
POTASSIUM: 4.5 mmol/L (ref 3.5–5.3)
Sodium: 142 mmol/L (ref 135–146)
TOTAL PROTEIN: 6.8 g/dL (ref 6.1–8.1)
Total Bilirubin: 0.8 mg/dL (ref 0.2–1.2)

## 2017-06-20 LAB — CBC WITH DIFFERENTIAL/PLATELET
BASOS ABS: 22 {cells}/uL (ref 0–200)
Basophils Relative: 0.5 %
EOS PCT: 0.9 %
Eosinophils Absolute: 40 cells/uL (ref 15–500)
HCT: 38.8 % (ref 38.5–50.0)
Hemoglobin: 13.4 g/dL (ref 13.2–17.1)
LYMPHS ABS: 1179 {cells}/uL (ref 850–3900)
MCH: 31.2 pg (ref 27.0–33.0)
MCHC: 34.5 g/dL (ref 32.0–36.0)
MCV: 90.2 fL (ref 80.0–100.0)
MONOS PCT: 7.5 %
MPV: 10.6 fL (ref 7.5–12.5)
NEUTROS PCT: 64.3 %
Neutro Abs: 2829 cells/uL (ref 1500–7800)
PLATELETS: 201 10*3/uL (ref 140–400)
RBC: 4.3 10*6/uL (ref 4.20–5.80)
RDW: 13.1 % (ref 11.0–15.0)
Total Lymphocyte: 26.8 %
WBC mixed population: 330 cells/uL (ref 200–950)
WBC: 4.4 10*3/uL (ref 3.8–10.8)

## 2017-06-20 LAB — PSA: PSA: 3.7 ng/mL (ref <=4.0)

## 2017-06-20 LAB — TSH: TSH: 3.33 m[IU]/L (ref 0.40–4.50)

## 2017-06-20 NOTE — Progress Notes (Signed)
Patient: James Mora, Male    DOB: 07-Jun-1936, 81 y.o.   MRN: [id number] Visit Date: 06/20/2017  Today's Provider: Vernie Murders, PA   Chief Complaint  Patient presents with  . Medicare Wellness   Subjective:    Annual wellness visit James Mora is a 81 y.o. male who presents today for his Subsequent Annual Wellness Visit. He feels well. He reports exercising none. He reports he is sleeping well.  ----------------------------------------------------------- Family History  Family history unknown: Yes    Review of Systems  Constitutional: Negative.   HENT: Negative.   Eyes: Positive for itching.  Respiratory: Negative.   Cardiovascular: Negative.   Gastrointestinal: Negative.   Endocrine: Negative.   Genitourinary: Positive for difficulty urinating.  Musculoskeletal: Positive for back pain.  Skin: Negative.   Allergic/Immunologic: Negative.   Neurological: Negative.   Hematological: Negative.   Psychiatric/Behavioral: Negative.     Social History   Socioeconomic History  . Marital status: Married    Spouse name: Not on file  . Number of children: Not on file  . Years of education: Not on file  . Highest education level: Not on file  Social Needs  . Financial resource strain: Not on file  . Food insecurity - worry: Not on file  . Food insecurity - inability: Not on file  . Transportation needs - medical: Not on file  . Transportation needs - non-medical: Not on file  Occupational History  . Not on file  Tobacco Use  . Smoking status: Never Smoker  . Smokeless tobacco: Never Used  Substance and Sexual Activity  . Alcohol use: No    Alcohol/week: 0.0 oz  . Drug use: No  . Sexual activity: Not on file  Other Topics Concern  . Not on file  Social History Narrative  . Not on file    Patient Active Problem List   Diagnosis Date Noted  . Narrowing of intervertebral disc space 06/18/2016  . Elevated prostate specific antigen (PSA) 06/18/2016  . Benign  fibroma of prostate 06/18/2016  . HLD (hyperlipidemia)   . Mixed hyperlipidemia   . CAD (coronary artery disease)   . Coronary artery disease involving native coronary artery of native heart with angina pectoris with documented spasm ([place])   . NSTEMI (non-ST elevated myocardial infarction) ([place]) 03/19/2016  . HTN (hypertension)   . High blood cholesterol level   . Acid reflux   . Hyperlipidemia 12/27/2008  . Essential hypertension 12/27/2008  . Coronary atherosclerosis 12/27/2008  . Aortic valve disorder 12/27/2008  . Esophagitis, reflux 06/06/2006  . Hypertensive heart disease 08/10/2003    Past Surgical History:  Procedure Laterality Date  . CARDIAC CATHETERIZATION  2000   left heart  . COLONOSCOPY  2008   Dr. Allen Norris    His Family history is unknown by patient.     Allergies as of 06/20/2017   No Known Allergies     Medication List        Accurate as of 06/20/17  8:58 AM. Always use your most recent med list.          aspirin 81 MG tablet Take 81 mg by mouth daily.   atorvastatin 20 MG tablet Commonly known as:  LIPITOR take 1 tablet by mouth once daily   carvedilol 12.5 MG tablet Commonly known as:  COREG take 1 tablet by mouth twice a day   chlorthalidone 25 MG tablet Commonly known as:  HYGROTON Take 1 tablet (25 mg total) by mouth daily.  enalapril 20 MG tablet Commonly known as:  VASOTEC take 1 tablet by mouth once daily   nitroGLYCERIN 0.4 MG SL tablet Commonly known as:  NITROSTAT Place 0.4 mg under the tongue every 5 (five) minutes as needed.   omeprazole 20 MG capsule Commonly known as:  PRILOSEC Take 2 capsules (40 mg total) by mouth daily.        Patient Care Team: Henley Blyth, Vickki Muff, PA as PCP - General (Family Medicine)      Objective:   Vitals: BP 132/74 (BP Location: Right Arm, Patient Position: Sitting, Cuff Size: Normal)   Pulse (!) 50   Temp 97.7 F (36.5 C) (Oral)   Ht 5\' 10"  (1.778 m)   Wt 177 lb 9.6 oz (80.6 kg)    SpO2 99%   BMI 25.48 kg/m   Physical Exam  Constitutional: He is oriented to person, place, and time. He appears well-developed.  HENT:  Head: Normocephalic.  Nose: Nose normal.  Mouth/Throat: Oropharynx is clear and moist.  Large amount of cerumen bilaterally.  Eyes: Conjunctivae and EOM are normal.  Neck: Neck supple. No thyromegaly present.  Cardiovascular: Normal rate and regular rhythm.  Pulmonary/Chest: Effort normal and breath sounds normal.  Abdominal: Soft. Bowel sounds are normal.  Genitourinary: Rectum normal and penis normal. Rectal exam shows guaiac negative stool.  Genitourinary Comments: Very large prostate without nodule appreciated.  Musculoskeletal: He exhibits no edema.  Complete loss of right ankle ROM due to fusion from crush injury in 1960. Good pulses throughout. Fair ROM otherwise.  Lymphadenopathy:    He has no cervical adenopathy.  Neurological: He is alert and oriented to person, place, and time.  Skin: No rash noted.  Psychiatric: He has a normal mood and affect. His behavior is normal. Thought content normal.    Activities of Daily Living In your present state of health, do you have any difficulty performing the following activities: 06/20/2017  Hearing? N  Vision? N  Difficulty concentrating or making decisions? N  Walking or climbing stairs? N  Dressing or bathing? N  Doing errands, shopping? N  Some recent data might be hidden    Fall Risk Assessment Fall Risk  06/20/2017 06/18/2016  Falls in the past year? No No     Depression Screen PHQ 2/9 Scores 06/20/2017 06/18/2016  PHQ - 2 Score 0 0  PHQ- 9 Score 0 -    Cognitive Testing - 6-CIT  Correct? Score   What year is it? yes 0 0 or 4  What month is it? yes 0 0 or 3  Memorize:    James Mora,  44,  [place],      What time is it? (within 1 hour) yes 0 0 or 3  Count backwards from 20 yes 0 0, 2, or 4  Name the months of the year yes 3 0, 2, or 4  Repeat name & address  above yes 3 0, 2, 4, 6, 8, or 10       TOTAL SCORE  6/28   Interpretation:  Normal  Normal (0-7) Abnormal (8-28)    Audit-C Alcohol Use Screening  Question Answer Points  How often do you have alcoholic drink? never 0  On days you do drink alcohol, how many drinks do you typically consume? 0 0  How oftey will you drink 6 or more in a total? never 0  Total Score:  0   A score of 3 or more in women, and 4  or more in men indicates increased risk for alcohol abuse, EXCEPT if all of the points are from question 1.     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      Health Maintenance  Topic Date Due  . INFLUENZA VACCINE  03/09/2017  . TETANUS/TDAP  06/20/2018 (Originally 05/16/1955)  . PNA vac Low Risk Adult (1 of 2 - PCV13) 06/20/2018 (Originally 05/17/2003)    Discussed health benefits of physical activity, and encouraged him to engage in regular exercise appropriate for his age and condition.    ------------------------------------------------------------------------------------------------------------ 1. Medicare annual wellness visit, subsequent General health stable. Continues to work at [employer] and mows his own lawn each week. No other routine exercise program. Refuses any vaccinations today. Will schedule screening colonoscopy (last one was in 2006). All screening reviewed from above note. Encouraged to follow low fat diet and recheck annually.  2. Coronary artery disease involving native coronary artery of native heart with angina  pectoris with documented spasm [facility]) History of cardiac cath in 2000 showing severe distal LAD that was managed medically. Has a small NSTEMI in August 2017 with normal ejection fraction and stable mild aortic regurgitation. Has NTG at home but has "never" used it. Followed annually by Dr. Fletcher Anon (cardiologist - last evaluation was 02-17-17 with EKG showing sinus bradycardia and RBBB). Continue present medications and annual follow up with Dr. Fletcher Anon. - Lipid panel  3. Essential hypertension Dr. Fletcher Anon changed Amlodipine on 02-17-17 due to peripheral edema. Gave him Chlorthalidone 25 mg qd and edema has resolved. Continue Enalapril 20 mg qd with Cored 12.5 mg BID without dizziness, cramps or syncope. Recheck CMP, CBC, TSH and Lipid Panel. Follow up pending reports. - COMPLETE METABOLIC PANEL WITH GFR - CBC with Differential/Platelet - Lipid panel - TSH  4. Benign fibroma of prostate Get up only once a night (near 4-4:30 am) to urinate. A little decreased stream. No retention or hematuria. Prostate size >40 gm. Will get PSA. - PSA  5. Mixed hyperlipidemia Tolerating Atorvastatin 20 mg qd without side effects. Recheck labs and follow up pending reports. - COMPLETE METABOLIC PANEL WITH GFR - Lipid panel - TSH  6. Bilateral impacted cerumen States wife notices some diminished hearing. Wants to schedule ear lavage in the near future.  7. Hx of colonic polyps History of benign colon polyps and diverticulosis in 2006 per colonoscopy by Dr. Allen Norris (GI). No hematochezia or melena. Schedule follow up colonoscopy screening. - Ambulatory referral to Gastroenterology

## 2017-06-21 ENCOUNTER — Telehealth: Payer: Self-pay

## 2017-06-21 NOTE — Telephone Encounter (Signed)
-----   Message from [name], [place] sent at 06/21/2017  7:41 AM EST ----- All blood tests normal except kidney function still shows nearly the same decline as 2 months ago. Recommend an increase in water intake and follow up levels in 3 months.

## 2017-06-21 NOTE — Telephone Encounter (Signed)
Patient advised. He verbalized understanding.  

## 2017-06-23 ENCOUNTER — Ambulatory Visit (INDEPENDENT_AMBULATORY_CARE_PROVIDER_SITE_OTHER): Payer: Medicare Other | Admitting: Family Medicine

## 2017-06-23 ENCOUNTER — Encounter: Payer: Self-pay | Admitting: Family Medicine

## 2017-06-23 VITALS — BP 124/70 | HR 56 | Temp 97.9°F | Resp 16 | Wt 178.2 lb

## 2017-06-23 DIAGNOSIS — H6123 Impacted cerumen, bilateral: Secondary | ICD-10-CM

## 2017-06-23 NOTE — Progress Notes (Signed)
    Patient: James Mora Male    DOB: 02-21-1936   81 y.o.   MRN: [id number] Visit Date: 06/23/2017  Today's Provider: Vernie Murders, PA   Chief Complaint  Patient presents with  . Ear Irrigation   Subjective:    HPI Bilateral Ear Fullness   There is fullness in both ears. This is a recurrent problem. The problem has been gradually worsening. Associated symptoms include ear wax buildup . Patient is requesting both ears be irrigated today.      No Known Allergies   Current Outpatient Medications:  .  aspirin 81 MG tablet, Take 81 mg by mouth daily., Disp: , Rfl:  .  atorvastatin (LIPITOR) 20 MG tablet, take 1 tablet by mouth once daily, Disp: 30 tablet, Rfl: 6 .  carvedilol (COREG) 12.5 MG tablet, take 1 tablet by mouth twice a day, Disp: 180 tablet, Rfl: 3 .  enalapril (VASOTEC) 20 MG tablet, take 1 tablet by mouth once daily, Disp: 30 tablet, Rfl: 3 .  nitroGLYCERIN (NITROSTAT) 0.4 MG SL tablet, Place 0.4 mg under the tongue every 5 (five) minutes as needed.  , Disp: , Rfl:  .  omeprazole (PRILOSEC) 20 MG capsule, Take 2 capsules (40 mg total) by mouth daily., Disp: 60 capsule, Rfl: 3 .  chlorthalidone (HYGROTON) 25 MG tablet, Take 1 tablet (25 mg total) by mouth daily., Disp: 90 tablet, Rfl: 3  Review of Systems  Constitutional: Negative.   HENT:       Bilateral ear wax buildup   Respiratory: Negative.   Cardiovascular: Negative.     Social History   Tobacco Use  . Smoking status: Never Smoker  . Smokeless tobacco: Never Used  Substance Use Topics  . Alcohol use: No    Alcohol/week: 0.0 oz   Objective:   BP 124/70 (BP Location: Right Arm, Patient Position: Sitting, Cuff Size: Normal)   Pulse (!) 56   Temp 97.9 F (36.6 C) (Oral)   Resp 16   Wt 178 lb 3.2 oz (80.8 kg)   SpO2 97%   BMI 25.57 kg/m    Physical Exam  HENT:  Both ear canals occluded with wax. No pain but stopped up sensation.      Assessment & Plan:     1. Bilateral impacted  cerumen Noticed impacted cerumen on physical exam on 06-21-17. Irrigated canals clear of wax and TM's look normal without discomfort. Hearing back to normal. Recheck prn. - Ear Lavage       Vernie Murders, [facility]

## 2017-07-20 ENCOUNTER — Other Ambulatory Visit: Payer: Self-pay | Admitting: Cardiovascular Disease

## 2017-08-06 ENCOUNTER — Other Ambulatory Visit: Payer: Self-pay | Admitting: Cardiovascular Disease

## 2017-08-06 ENCOUNTER — Other Ambulatory Visit: Payer: Self-pay | Admitting: Family Medicine

## 2017-08-06 DIAGNOSIS — E7800 Pure hypercholesterolemia, unspecified: Secondary | ICD-10-CM

## 2017-09-06 ENCOUNTER — Other Ambulatory Visit: Payer: Self-pay | Admitting: Family Medicine

## 2017-09-06 DIAGNOSIS — K219 Gastro-esophageal reflux disease without esophagitis: Secondary | ICD-10-CM

## 2017-09-08 ENCOUNTER — Telehealth: Payer: Self-pay | Admitting: Family Medicine

## 2017-09-08 NOTE — Telephone Encounter (Signed)
FYI--Pt refused colonoscopy that was ordered in Nov 2018

## 2017-09-14 NOTE — Telephone Encounter (Signed)
CPE 06/20/2017

## 2017-09-20 ENCOUNTER — Telehealth: Payer: Self-pay | Admitting: Family Medicine

## 2017-09-20 NOTE — Telephone Encounter (Signed)
Pt called stating he would like to do the cologuard.He does not feel comfortable getting colonoscopy because he has had 3 heart attacks and does not want to be put to sleep

## 2017-09-20 NOTE — Telephone Encounter (Signed)
Colonoscopy is the preferred test because you have had polyps in the past. You did not have any problems during the past colonoscopy and they don't use general anesthesia. Any positive response on the Cologuard test requires the colonoscopy, anyway. Recommend you go to the pre-test appointment with the gastroenterologist to discuss options.

## 2017-09-20 NOTE — Telephone Encounter (Signed)
Pt notified we will go forward with colonoscopy

## 2017-10-11 ENCOUNTER — Other Ambulatory Visit: Payer: Self-pay | Admitting: Family Medicine

## 2017-10-11 DIAGNOSIS — E7800 Pure hypercholesterolemia, unspecified: Secondary | ICD-10-CM

## 2017-10-11 MED ORDER — ATORVASTATIN CALCIUM 20 MG PO TABS: 20.0000 mg | ORAL_TABLET | Freq: Every day | ORAL | 3 refills | Status: DC

## 2017-10-11 NOTE — Telephone Encounter (Signed)
Pt contacted office for refill request on the following medications:  atorvastatin (LIPITOR) 20 MG tablet   [facility] [address]  Pt stated that when [employer] became [employer] his Rx for the atorvastatin (LIPITOR) 20 MG tablet wasn't transferred from the [facility] system to [facility] system. Pt stated that [facility] advised him we would need to send a new Rx for him to get the Atorvastatin. Please advise. Thanks TNP

## 2017-10-28 ENCOUNTER — Encounter: Payer: Self-pay | Admitting: Family Medicine

## 2017-10-28 ENCOUNTER — Ambulatory Visit (INDEPENDENT_AMBULATORY_CARE_PROVIDER_SITE_OTHER): Payer: Medicare Other | Admitting: Family Medicine

## 2017-10-28 VITALS — BP 120/62 | HR 56 | Temp 98.1°F | Wt 176.2 lb

## 2017-10-28 DIAGNOSIS — L821 Other seborrheic keratosis: Secondary | ICD-10-CM | POA: Diagnosis not present

## 2017-10-28 DIAGNOSIS — L570 Actinic keratosis: Secondary | ICD-10-CM | POA: Diagnosis not present

## 2017-10-28 DIAGNOSIS — L299 Pruritus, unspecified: Secondary | ICD-10-CM

## 2017-10-28 NOTE — Progress Notes (Signed)
Patient: James Mora Male    DOB: [birth year]   82 y.o.   MRN: [id number] Visit Date: 10/28/2017  Today's Provider: Vernie Murders, PA   Chief Complaint  Patient presents with  . Pruritis   Subjective:    HPI Pruritis: This is a new problem. Episode onset: 3 weeks ago. The problem occurs constantly. The problem has been gradually worsening. Associated symptoms comments: burning itch over entire body except for face. Exacerbated by: when patient gets hot. Patient reports itching started on back and arms then moved to chest area. He denies any changes in laundry detergent or personal hygiene items. Patient states he did change pharmacies. He is unsure of medications are coming from a different manufacturer, and could be contributing to symptoms.   Past Medical History:  Diagnosis Date  . Acid reflux   . Aortic insufficiency    a. 03/2016 Echo: EF 55-60%, no rwma, Gr1 DD, mild to mod AI, mild TR.  [facility] CAD (coronary artery disease)    a. 2000 Cath: Severe distal LAD disease with left to left collaterals;  b. 2008 MV: non-ischemic; c. 03/2016 Cath: LM nl, LAD 30p, D1/D2 mod, nl, LCX nl, OM1/2 nl w/ L-->L collats to apical LAD, RCA dominant, nl. EF 55-65%.  . Hypertensive heart disease 2005   unspecified  . Mixed hyperlipidemia    mixed   Past Surgical History:  Procedure Laterality Date  . CARDIAC CATHETERIZATION  2000   left heart  . CARDIAC CATHETERIZATION N/A 03/22/2016   Procedure: Left Heart Cath And Coronary Angiography;  Surgeon: Minna Merritts, MD;  Location: [facility];  Service: Cardiovascular;  Laterality: N/A;  . COLONOSCOPY  2008   Dr. Allen Norris   Family History  Family history unknown: Yes   No Known Allergies  Current Outpatient Medications:  .  aspirin 81 MG tablet, Take 81 mg by mouth daily., Disp: , Rfl:  .  atorvastatin (LIPITOR) 20 MG tablet, Take 1 tablet (20 mg total) by mouth daily., Disp: 90 tablet, Rfl: 3 .  carvedilol (COREG) 12.5 MG  tablet, take 1 tablet by mouth twice a day, Disp: 180 tablet, Rfl: 3 .  enalapril (VASOTEC) 20 MG tablet, take 1 tablet by mouth once daily, Disp: 30 tablet, Rfl: 3 .  nitroGLYCERIN (NITROSTAT) 0.4 MG SL tablet, Place 0.4 mg under the tongue every 5 (five) minutes as needed.  , Disp: , Rfl:  .  omeprazole (PRILOSEC) 20 MG capsule, take 2 capsules by mouth daily, Disp: 60 capsule, Rfl: 3 .  chlorthalidone (HYGROTON) 25 MG tablet, Take 1 tablet (25 mg total) by mouth daily., Disp: 90 tablet, Rfl: 3  Review of Systems  Constitutional: Negative.   Respiratory: Negative.   Cardiovascular: Negative.   Skin:       Pruritis    Social History   Tobacco Use  . Smoking status: Never Smoker  . Smokeless tobacco: Never Used  Substance Use Topics  . Alcohol use: No    Alcohol/week: 0.0 oz   Objective:   BP 120/62 (BP Location: Right Arm, Patient Position: Sitting, Cuff Size: Normal)   Pulse (!) 56   Temp 98.1 F (36.7 C) (Oral)   Wt 176 lb 3.2 oz (79.9 kg)   SpO2 96%   BMI 25.28 kg/m   Physical Exam  Constitutional: He is oriented to person, place, and time. He appears well-developed and well-nourished. No distress.  HENT:  Head: Normocephalic and atraumatic.  Right Ear:  Hearing normal.  Left Ear: Hearing normal.  Nose: Nose normal.  Eyes: Conjunctivae and lids are normal. Right eye exhibits no discharge. Left eye exhibits no discharge. No scleral icterus.  Pulmonary/Chest: Effort normal. No respiratory distress.  Musculoskeletal: Normal range of motion.  Neurological: He is alert and oriented to person, place, and time.  Skin: Skin is intact. No lesion and no rash noted.  Huge number of brown spots, seborrheic keratoses with two pink areas on both shoulders (suspected actinic keratoses).  Psychiatric: He has a normal mood and affect. His speech is normal and behavior is normal. Thought content normal.      Assessment & Plan:     1. Pruritus Onset over the past 3 weeks without  new detergents, lotions, colognes, soaps, etc. Changed of pharmacy was due to [employer] being bought by [employer]. Oral medications have not changed shapes or colors and have been used for years. Itching occurs late in the evenings and after shower when he goes to bed. Recommend moisturizing with Sarna Lotion and may use Benadryl or Zyrtec to control itch. Schedule dermatology referral for screening of multiple lesions over entire body. - Ambulatory referral to Dermatology  2. Seborrheic keratoses Huge number of lesions scattered over entire body. - Ambulatory referral to Dermatology  3. Actinic keratosis Pink patches on posterior top of each shoulder approximately 2-3 cm x 4 cm. Has many brown spots and seborrheic keratoses in a heavy pattern over thorax, arms, neck, etc. History of sunbathing in the past. Recommend screening by dermatologist (he requested Dr. Laurence Ferrari at [facility] since his dermatologist - Dr. Koleen Nimrod - has retired). - Ambulatory referral to Dermatology       Vernie Murders, [facility]

## 2017-10-30 ENCOUNTER — Emergency Department
Admission: EM | Admit: 2017-10-30 | Discharge: 2017-10-30 | Disposition: A | Discharge status: Home / Self Care | Payer: Medicare Other | Attending: Emergency Medicine | Admitting: Emergency Medicine

## 2017-10-30 ENCOUNTER — Encounter: Payer: Self-pay | Admitting: Emergency Medicine

## 2017-10-30 DIAGNOSIS — I252 Old myocardial infarction: Secondary | ICD-10-CM | POA: Diagnosis not present

## 2017-10-30 DIAGNOSIS — Z79899 Other long term (current) drug therapy: Secondary | ICD-10-CM | POA: Diagnosis not present

## 2017-10-30 DIAGNOSIS — R21 Rash and other nonspecific skin eruption: Secondary | ICD-10-CM | POA: Insufficient documentation

## 2017-10-30 DIAGNOSIS — I25111 Atherosclerotic heart disease of native coronary artery with angina pectoris with documented spasm: Secondary | ICD-10-CM | POA: Diagnosis not present

## 2017-10-30 DIAGNOSIS — Z7982 Long term (current) use of aspirin: Secondary | ICD-10-CM | POA: Insufficient documentation

## 2017-10-30 DIAGNOSIS — I119 Hypertensive heart disease without heart failure: Secondary | ICD-10-CM | POA: Insufficient documentation

## 2017-10-30 DIAGNOSIS — L299 Pruritus, unspecified: Secondary | ICD-10-CM | POA: Diagnosis not present

## 2017-10-30 MED ORDER — DEXAMETHASONE SODIUM PHOSPHATE 10 MG/ML IJ SOLN
10.0000 mg | Freq: Once | INTRAMUSCULAR | Status: AC
  Administered 2017-10-30: 10 mg via INTRAMUSCULAR
  Filled 2017-10-30: qty 1

## 2017-10-30 MED ORDER — PREDNISONE 10 MG PO TABS: ORAL_TABLET | ORAL | 0 refills | Status: DC

## 2017-10-30 MED ORDER — HYDROXYZINE HCL 25 MG PO TABS: 25.0000 mg | ORAL_TABLET | Freq: Four times a day (QID) | ORAL | 0 refills | Status: DC | PRN

## 2017-10-30 NOTE — ED Notes (Signed)
See triage note  Developed generalized rash for couple of days  Fine rash noted to left lateral chest  And to arms

## 2017-10-30 NOTE — ED Triage Notes (Signed)
Patient presents to the ED with itching and burning rash x 2 days.  Patient states he had itching x 1 week with no rash.  His doctor instructed him to use benadryl cream.  Rash appeared after using the cream.  Patient is in no obvious distress at this time.

## 2017-10-30 NOTE — ED Provider Notes (Signed)
[facility] Emergency Department Provider Note   ____________________________________________   First MD Initiated Contact with Patient 10/30/17 1240     (approximate)  I have reviewed the triage vital signs and the nursing notes.   HISTORY  Chief Complaint Rash  HPI James Mora is a 82 y.o. male is here with complaint of an itchy burning rash for the last 2 days.  Patient states that he began itching approximately 1 week ago but had no rash.  His doctor instructed him to begin using Benadryl cream.  He states that after he began using the cream he noticed the rash appearing.  He denies any breathing difficulty, no fever, and rash does not come and go.  Rash is now on his torso, upper and lower extremities.  Currently he rates his pain as 10/10.   Past Medical History:  Diagnosis Date  . Acid reflux   . Aortic insufficiency    a. 03/2016 Echo: EF 55-60%, no rwma, Gr1 DD, mild to mod AI, mild TR.  Marland Kitchen CAD (coronary artery disease)    a. 2000 Cath: Severe distal LAD disease with left to left collaterals;  b. 2008 MV: non-ischemic; c. 03/2016 Cath: LM nl, LAD 30p, D1/D2 mod, nl, LCX nl, OM1/2 nl w/ L-->L collats to apical LAD, RCA dominant, nl. EF 55-65%.  . Hypertensive heart disease 2005   unspecified  . Mixed hyperlipidemia    mixed    Patient Active Problem List   Diagnosis Date Noted  . Narrowing of intervertebral disc space 06/18/2016  . Elevated prostate specific antigen (PSA) 06/18/2016  . Benign fibroma of prostate 06/18/2016  . HLD (hyperlipidemia)   . Mixed hyperlipidemia   . CAD (coronary artery disease)   . Coronary artery disease involving native coronary artery of native heart with angina pectoris with documented spasm ([place])   . NSTEMI (non-ST elevated myocardial infarction) ([place]) 03/19/2016  . HTN (hypertension)   . High blood cholesterol level   . Acid reflux   . Hyperlipidemia 12/27/2008  . Essential hypertension 12/27/2008  .  Coronary atherosclerosis 12/27/2008  . Aortic valve disorder 12/27/2008  . Esophagitis, reflux 06/06/2006  . Hypertensive heart disease 08/10/2003    Past Surgical History:  Procedure Laterality Date  . CARDIAC CATHETERIZATION  2000   left heart  . CARDIAC CATHETERIZATION N/A 03/22/2016   Procedure: Left Heart Cath And Coronary Angiography;  Surgeon: Minna Merritts, MD;  Location: [place] [facility];  Service: Cardiovascular;  Laterality: N/A;  . COLONOSCOPY  2008   Dr. Allen Norris    Prior to Admission medications   Medication Sig Start Date End Date Taking? Authorizing Provider  aspirin 81 MG tablet Take 81 mg by mouth daily.    [provider]  atorvastatin (LIPITOR) 20 MG tablet Take 1 tablet (20 mg total) by mouth daily. 10/11/17   Chrismon, Vickki Muff, PA  carvedilol (COREG) 12.5 MG tablet take 1 tablet by mouth twice a day 07/20/17   Wellington Hampshire, MD  chlorthalidone (HYGROTON) 25 MG tablet Take 1 tablet (25 mg total) by mouth daily. 02/17/17 05/18/17  Wellington Hampshire, MD  enalapril (VASOTEC) 20 MG tablet take 1 tablet by mouth once daily 08/08/17   Wellington Hampshire, MD  hydrOXYzine (ATARAX/VISTARIL) 25 MG tablet Take 1 tablet (25 mg total) by mouth every 6 (six) hours as needed for itching. 10/30/17   Johnn Hai, PA-C  nitroGLYCERIN (NITROSTAT) 0.4 MG SL tablet Place 0.4 mg under the  tongue every 5 (five) minutes as needed.      [provider]  omeprazole (PRILOSEC) 20 MG capsule take 2 capsules by mouth daily 09/06/17   Chrismon, Vickki Muff, PA  predniSONE (DELTASONE) 10 MG tablet Take 3 tablets once a day for 3 days. 10/30/17   Johnn Hai, PA-C  lovastatin (ALTOPREV) 40 MG 24 hr tablet Take 80 mg by mouth at bedtime.    10/15/11  [provider]  simvastatin (ZOCOR) 40 MG tablet Take 40 mg by mouth at bedtime.    10/20/11  [provider]    Allergies Patient has no known allergies.  Family History  Family history unknown: Yes     Social History Social History   Tobacco Use  . Smoking status: Never Smoker  . Smokeless tobacco: Never Used  Substance Use Topics  . Alcohol use: No    Alcohol/week: 0.0 oz  . Drug use: No    Review of Systems Constitutional: No fever/chills Eyes: No visual changes. ENT: No sore throat.  Cardiovascular: Denies chest pain. Respiratory: Denies shortness of breath.  Gastrointestinal: No abdominal pain.  No nausea, no vomiting. Musculoskeletal: Negative for muscle aches. Skin: Positive for rash. Neurological: Negative for headaches, focal weakness or numbness. ____________________________________________   PHYSICAL EXAM:  VITAL SIGNS: ED Triage Vitals  Enc Vitals Group     BP 10/30/17 1215 133/66     Pulse Rate 10/30/17 1215 (!) 57     Resp 10/30/17 1215 18     Temp 10/30/17 1215 97.7 F (36.5 C)     Temp Source 10/30/17 1215 Oral     SpO2 10/30/17 1215 97 %     Weight 10/30/17 1213 174 lb (78.9 kg)     Height 10/30/17 1213 5\' 10"  (1.778 m)     Head Circumference --      Peak Flow --      Pain Score 10/30/17 1213 10     Pain Loc --      Pain Edu? --      Excl. in [place]? --    Constitutional: Alert and oriented. Well appearing and in no acute distress. Eyes: Conjunctivae are normal.  Head: Atraumatic. Mouth/Throat: Mucous membranes are moist.  Oropharynx non-erythematous.  No edema. Neck: No stridor.   Cardiovascular: Normal rate, regular rhythm. Grossly normal heart sounds.  Good peripheral circulation. Respiratory: Normal respiratory effort.  No retractions. Lungs CTAB. Gastrointestinal: Soft and nontender. No distention.  Musculoskeletal: Moves upper and lower extremities without any difficulty and normal gait was noted while in the exam room.  Patient was ambulatory without any assistance. Neurologic:  Normal speech and language. No gross focal neurologic deficits are appreciated. Skin:  Skin is warm, dry and intact.  Erythematous macular papular rash is  noted anterior and posterior torso.  Upper and lower extremities and no particular pattern.  Skin is intact and no weeping or drainage was noted.  Area is nontender to palpation. Psychiatric: Mood and affect are normal. Speech and behavior are normal.  ____________________________________________   LABS (all labs ordered are listed, but only abnormal results are displayed)  Labs Reviewed - No data to display ____________________________________________  PROCEDURES  Procedure(s) performed: None  Procedures  Critical Care performed: No  ____________________________________________   INITIAL IMPRESSION / ASSESSMENT AND PLAN / ED COURSE Patient was presents with a rash and itching for 1 week.  Patient has been using Benadryl cream without any relief.  He states the itching is more intense the last  2 days. Patient was given Decadron 10 mg IM while in the department.  He also was given prescription for Atarax 25 mg 1 every 6 hours as needed for itching.  A boost of prednisone 30 mg daily for the next 3 days was also given.  Patient was instructed to follow-up with his PCP on Monday and for possible referral to a dermatologist for continued evaluation of his rash.  ____________________________________________   FINAL CLINICAL IMPRESSION(S) / ED DIAGNOSES  Final diagnoses:  Rash and nonspecific skin eruption     ED Discharge Orders        Ordered    hydrOXYzine (ATARAX/VISTARIL) 25 MG tablet  Every 6 hours PRN     10/30/17 1402    predniSONE (DELTASONE) 10 MG tablet     10/30/17 1402       Note:  This document was prepared using Dragon voice recognition software and may include unintentional dictation errors.    Johnn Hai, PA-C 10/30/17 1612    Lisa Roca, MD 11/02/17 [phone]

## 2017-10-30 NOTE — Discharge Instructions (Signed)
Follow-up with your primary care provider if any continued problems.  Begin taking prednisone tomorrow 3 tablets once a day for 3 days.  Atarax 25 mg 1 every 6 hours as needed for itching.  You may continue using Benadryl cream on areas that are itching when needed.  You may need to see a dermatologist.  Both dermatologist offices listed on your discharge papers are in [place].

## 2017-10-31 ENCOUNTER — Other Ambulatory Visit: Payer: Self-pay

## 2017-10-31 ENCOUNTER — Telehealth: Payer: Self-pay

## 2017-10-31 NOTE — Patient Outreach (Signed)
Spoke with patient in regards to recent ED visit. Patient states he had a rash he could know longer tolerate and went to ED. Received a spot for itching. Patient's PCP has made contact as of this morning and is awaiting a call back in regards to follow up appointment.  Will mail successful letter, 24 hour nurse line magnet, and know before you go on 10/31/17.  [place] Management Assistant

## 2017-10-31 NOTE — Telephone Encounter (Signed)
Referral was placed on 10/28/17. Judson Roch was out of the office that day.

## 2017-10-31 NOTE — Telephone Encounter (Signed)
Patient called office to inquire on status of dermatology referral.KW

## 2017-11-01 NOTE — Telephone Encounter (Signed)
James Mora completed referral.

## 2017-12-14 DIAGNOSIS — D485 Neoplasm of uncertain behavior of skin: Secondary | ICD-10-CM | POA: Diagnosis not present

## 2017-12-14 DIAGNOSIS — L812 Freckles: Secondary | ICD-10-CM | POA: Diagnosis not present

## 2017-12-14 DIAGNOSIS — D225 Melanocytic nevi of trunk: Secondary | ICD-10-CM | POA: Diagnosis not present

## 2017-12-14 DIAGNOSIS — L821 Other seborrheic keratosis: Secondary | ICD-10-CM | POA: Diagnosis not present

## 2017-12-14 DIAGNOSIS — L814 Other melanin hyperpigmentation: Secondary | ICD-10-CM | POA: Diagnosis not present

## 2017-12-14 DIAGNOSIS — D0472 Carcinoma in situ of skin of left lower limb, including hip: Secondary | ICD-10-CM | POA: Diagnosis not present

## 2017-12-14 DIAGNOSIS — L570 Actinic keratosis: Secondary | ICD-10-CM | POA: Diagnosis not present

## 2017-12-14 DIAGNOSIS — R21 Rash and other nonspecific skin eruption: Secondary | ICD-10-CM | POA: Diagnosis not present

## 2017-12-14 DIAGNOSIS — D2262 Melanocytic nevi of left upper limb, including shoulder: Secondary | ICD-10-CM | POA: Diagnosis not present

## 2017-12-16 ENCOUNTER — Other Ambulatory Visit: Payer: Self-pay | Admitting: Cardiovascular Disease

## 2017-12-19 ENCOUNTER — Other Ambulatory Visit: Payer: Self-pay | Admitting: *Deleted

## 2017-12-19 ENCOUNTER — Telehealth: Payer: Self-pay | Admitting: Cardiovascular Disease

## 2017-12-19 MED ORDER — ENALAPRIL MALEATE 20 MG PO TABS: 20.0000 mg | ORAL_TABLET | Freq: Every day | ORAL | 1 refills | Status: DC

## 2017-12-19 NOTE — Telephone Encounter (Signed)
Pt is overdue for 6 month f/u appointment . He will need to schedule future appointment and we will send in refill until next visit.

## 2017-12-19 NOTE — Telephone Encounter (Signed)
Requested Prescriptions   Signed Prescriptions Disp Refills  . enalapril (VASOTEC) 20 MG tablet 30 tablet 1    Sig: Take 1 tablet (20 mg total) by mouth daily.    Authorizing Provider: [name] A    Ordering User: Britt Bottom

## 2017-12-19 NOTE — Telephone Encounter (Signed)
LMOV to schedule overdue 6 month f/u with Dr. Fletcher Anon

## 2017-12-19 NOTE — Telephone Encounter (Signed)
Pt scheduled for 01/06/18

## 2017-12-19 NOTE — Telephone Encounter (Signed)
°*  STAT* If patient is at the pharmacy, call can be transferred to refill team.   1. Which medications need to be refilled? (please list name of each medication and dose if known) Enalapril 20 MG  2. Which pharmacy/location (including street and city if local pharmacy) is medication to be sent to? [facility] on [address]   3. Do they need a 30 day or 90 day supply? 30 day   States refill request was refused, only has one pill left - if refill is not able to be filled, please call patient to discuss why

## 2017-12-21 DIAGNOSIS — L570 Actinic keratosis: Secondary | ICD-10-CM | POA: Diagnosis not present

## 2017-12-21 DIAGNOSIS — D0472 Carcinoma in situ of skin of left lower limb, including hip: Secondary | ICD-10-CM | POA: Diagnosis not present

## 2017-12-21 DIAGNOSIS — D225 Melanocytic nevi of trunk: Secondary | ICD-10-CM | POA: Diagnosis not present

## 2017-12-27 ENCOUNTER — Other Ambulatory Visit: Payer: Self-pay | Admitting: Family Medicine

## 2017-12-27 DIAGNOSIS — K219 Gastro-esophageal reflux disease without esophagitis: Secondary | ICD-10-CM

## 2017-12-28 DIAGNOSIS — D0462 Carcinoma in situ of skin of left upper limb, including shoulder: Secondary | ICD-10-CM | POA: Diagnosis not present

## 2017-12-28 DIAGNOSIS — D485 Neoplasm of uncertain behavior of skin: Secondary | ICD-10-CM | POA: Diagnosis not present

## 2017-12-28 DIAGNOSIS — L570 Actinic keratosis: Secondary | ICD-10-CM | POA: Diagnosis not present

## 2017-12-28 DIAGNOSIS — D2262 Melanocytic nevi of left upper limb, including shoulder: Secondary | ICD-10-CM | POA: Diagnosis not present

## 2017-12-28 DIAGNOSIS — D225 Melanocytic nevi of trunk: Secondary | ICD-10-CM | POA: Diagnosis not present

## 2017-12-28 DIAGNOSIS — D0472 Carcinoma in situ of skin of left lower limb, including hip: Secondary | ICD-10-CM | POA: Diagnosis not present

## 2017-12-28 DIAGNOSIS — L814 Other melanin hyperpigmentation: Secondary | ICD-10-CM | POA: Diagnosis not present

## 2018-01-05 NOTE — Progress Notes (Signed)
Cardiology Office Note Date:  01/06/2018  Patient ID:  James Mora, DOB 11/03/1935, MRN [id number] PCP:  James Common, PA  Cardiologist:  Dr. Fletcher Anon, MD    Chief Complaint: Follow-up  History of Present Illness: James Mora is a 82 y.o. male with history of CAD as detailed below, aortic insufficiency, hypertensive heart disease, CKD stage II, and hyperlipidemia who presents for follow-up of his CAD and hypertension.  Cardiac catheterization in 2000 showed severe distal LAD disease with left to left collaterals that was managed medically at that time.  He was admitted to the hospital with a non-STEMI in 03/2016 with a peak troponin of 3.55.  LHC showed mild proximal LAD disease with 30% stenosis and possible occlusion of a small PL 3.  EF was normal.  Medical management was recommended.  TTE showed normal LV systolic function with stable mild to moderate aortic insufficiency.  He was most recently seen in 02/2017 for follow-up and was doing extremely well.  He continues to have issues with bilateral lower extremity edema, right worse than left.  He was noted to continue to have difficulty in controlling his blood pressure with a BP reading of 152/72, heart rate 57 bpm.  He was also noted to have a small ulceration affecting the right ankle without significant claudication.  In the setting of his continued uncontrolled blood pressure, amlodipine was discontinued and he was started on chlorthalidone.  Given his lower extremity swelling and right ankle ulcer, lower extremity arterial ultrasound was performed which showed no evidence of PAD with normal ABIs.  Follow-up BMP after initiating chlorthalidone showed mild worsening of his renal function.  He was advised to decrease his chlorthalidone to 12.5 mg daily.  Most recent labs from 06/2017 showed a serum creatinine of 1.43, potassium 4.5, normal liver function, unremarkable CBC, LDL 58, normal thyroid function.  Patient comes in doing well  today.  His blood pressure has remained well controlled at home.  He cannot recall any specific readings though states he has not had any high or low readings.  He has not had any symptoms concerning for angina.  No shortness of breath, diaphoresis, dizziness, presyncope, or syncope.  He noted significant improvement in lower extremity swelling following the discontinuation of amlodipine and initiation of chlorthalidone.  Of note, on the patient's medication list it has him taking chlorthalidone 25 mg daily, however the patient is actually taking 12.5 mg daily which is what he was previously advised to take based on renal function as detailed above.  He denies any orthopnea, lower extremity swelling, abdominal distention, cough, PND, or early satiety.  Patient will be leaving to go to the beach in mid June to celebrate his 56th wedding anniversary with his wife.  Past Medical History:  Diagnosis Date  . Acid reflux   . Aortic insufficiency    a. 03/2016 Echo: EF 55-60%, no rwma, Gr1 DD, mild to mod AI, mild TR.  Marland Kitchen CAD (coronary artery disease)    a. 2000 Cath: Severe distal LAD disease with left to left collaterals;  b. 2008 MV: non-ischemic; c. 03/2016 Cath: LM nl, LAD 30p, D1/D2 mod, nl, LCX nl, OM1/2 nl w/ L-->L collats to apical LAD, RCA dominant, nl. EF 55-65%.  . Hypertensive heart disease 2005   unspecified  . Mixed hyperlipidemia    mixed    Past Surgical History:  Procedure Laterality Date  . CARDIAC CATHETERIZATION  2000   left heart  . CARDIAC CATHETERIZATION N/A 03/22/2016  Procedure: Left Heart Cath And Coronary Angiography;  Surgeon: Minna Merritts, MD;  Location: [place] [facility];  Service: Cardiovascular;  Laterality: N/A;  . COLONOSCOPY  2008   Dr. Allen Norris    Current Meds  Medication Sig  . aspirin 81 MG tablet Take 81 mg by mouth daily.  Marland Kitchen atorvastatin (LIPITOR) 20 MG tablet Take 1 tablet (20 mg total) by mouth daily.  . carvedilol (COREG) 12.5 MG tablet take 1 tablet  by mouth twice a day  . enalapril (VASOTEC) 20 MG tablet Take 1 tablet (20 mg total) by mouth daily.  . nitroGLYCERIN (NITROSTAT) 0.4 MG SL tablet Place 0.4 mg under the tongue every 5 (five) minutes as needed.    Marland Kitchen omeprazole (PRILOSEC) 20 MG capsule TAKE 2 CAPSULES BY MOUTH ONCE DAILY    Allergies:   Patient has no known allergies.   Social History:  The patient  reports that he has never smoked. He has never used smokeless tobacco. He reports that he does not drink alcohol or use drugs.   Family History:  The patient's Family history is unknown by patient.  ROS:   Review of Systems  Constitutional: Negative for chills, diaphoresis, fever, malaise/fatigue and weight loss.  HENT: Negative for congestion.   Eyes: Negative for discharge and redness.  Respiratory: Negative for cough, hemoptysis, sputum production, shortness of breath and wheezing.   Cardiovascular: Negative for chest pain, palpitations, orthopnea, claudication, leg swelling and PND.  Gastrointestinal: Negative for abdominal pain, blood in stool, heartburn, melena, nausea and vomiting.  Genitourinary: Negative for hematuria.  Musculoskeletal: Negative for falls and myalgias.  Skin: Negative for rash.  Neurological: Negative for dizziness, tingling, tremors, sensory change, speech change, focal weakness, loss of consciousness and weakness.  Endo/Heme/Allergies: Does not bruise/bleed easily.  Psychiatric/Behavioral: Negative for substance abuse. The patient is not nervous/anxious.   All other systems reviewed and are negative.    PHYSICAL EXAM:  VS:  BP 120/70 (BP Location: Left Arm, Patient Position: Sitting, Cuff Size: Normal)   Pulse 62   Ht 5\' 10"  (1.778 m)   Wt 173 lb 8 oz (78.7 kg)   BMI 24.89 kg/m  BMI: Body mass index is 24.89 kg/m.  Physical Exam  Constitutional: He is oriented to person, place, and time. He appears well-developed and well-nourished.  HENT:  Head: Normocephalic and atraumatic.  Eyes:  Right eye exhibits no discharge. Left eye exhibits no discharge.  Neck: Normal range of motion. No JVD present.  Cardiovascular: Normal rate, regular rhythm, S1 normal, S2 normal and normal heart sounds. Exam reveals no distant heart sounds, no friction rub, no midsystolic click and no opening snap.  No murmur heard. Pulses:      Posterior tibial pulses are 2+ on the right side, and 2+ on the left side.  Pulmonary/Chest: Effort normal and breath sounds normal. No respiratory distress. He has no decreased breath sounds. He has no wheezes. He has no rales. He exhibits no tenderness.  Abdominal: Soft. He exhibits no distension. There is no tenderness.  Musculoskeletal: He exhibits edema.  Trace bilateral ankle edema  Neurological: He is alert and oriented to person, place, and time.  Skin: Skin is warm and dry. No cyanosis. Nails show no clubbing.  Psychiatric: He has a normal mood and affect. His speech is normal and behavior is normal. Judgment and thought content normal.     EKG:  Was ordered and interpreted by me today. Shows NSR, 62 bpm, RBBB (unchanged from prior)  Recent Labs: 06/20/2017: ALT 10; BUN 23; Creat 1.43; Hemoglobin 13.4; Platelets 201; Potassium 4.5; Sodium 142; TSH 3.33  06/20/2017: Cholesterol 124; HDL 43; LDL Cholesterol (Calc) 58; Total CHOL/HDL Ratio 2.9; Triglycerides 148   CrCl cannot be calculated (Patient's most recent lab result is older than the maximum 21 days allowed.).   Wt Readings from Last 3 Encounters:  01/06/18 173 lb 8 oz (78.7 kg)  10/30/17 174 lb (78.9 kg)  10/28/17 176 lb 3.2 oz (79.9 kg)     Other studies reviewed: Additional studies/records reviewed today include: summarized above  ASSESSMENT AND PLAN:  1. CAD involving the native coronary arteries without angina: No symptoms concerning for angina.  Continue aspirin, Lipitor, carvedilol, enalapril.  Aggressive secondary prevention and risk factor modification.  No plans for ischemic  evaluation at this time.  2. Aortic insufficiency: Asymptomatic.  No murmur heard on exam.  Schedule repeat echocardiogram.  Continue to monitor clinically.  3. Essential hypertension: Blood pressure is well controlled.  Continue carvedilol, chlorthalidone, and enalapril.  4. Chronic venous insufficiency: Much improved since discontinuation of amlodipine and initiation of chlorthalidone.  Recent ABIs normal.  Continue to elevate legs when sitting.  5. Hyperlipidemia: Most recent LDL from 06/2017 of 58.  Liver function normal at that time.  Continue Lipitor 20 mg daily.  Disposition: F/u with Dr. Fletcher Anon in 6 months.  Current medicines are reviewed at length with the patient today.  The patient did not have any concerns regarding medicines.  Signed, Christell Faith, PA-C 01/06/2018 2:31 PM     [facility] [address] [place], [place] [address] [phone]

## 2018-01-06 ENCOUNTER — Ambulatory Visit: Payer: Medicare Other | Admitting: Physician Assistant

## 2018-01-06 ENCOUNTER — Encounter: Payer: Self-pay | Admitting: Physician Assistant

## 2018-01-06 VITALS — BP 120/70 | HR 62 | Ht 70.0 in | Wt 173.5 lb

## 2018-01-06 DIAGNOSIS — I25111 Atherosclerotic heart disease of native coronary artery with angina pectoris with documented spasm: Secondary | ICD-10-CM | POA: Diagnosis not present

## 2018-01-06 DIAGNOSIS — I872 Venous insufficiency (chronic) (peripheral): Secondary | ICD-10-CM

## 2018-01-06 DIAGNOSIS — E7800 Pure hypercholesterolemia, unspecified: Secondary | ICD-10-CM

## 2018-01-06 DIAGNOSIS — I359 Nonrheumatic aortic valve disorder, unspecified: Secondary | ICD-10-CM | POA: Diagnosis not present

## 2018-01-06 DIAGNOSIS — I10 Essential (primary) hypertension: Secondary | ICD-10-CM | POA: Diagnosis not present

## 2018-01-06 DIAGNOSIS — I2510 Atherosclerotic heart disease of native coronary artery without angina pectoris: Secondary | ICD-10-CM | POA: Diagnosis not present

## 2018-01-06 MED ORDER — CHLORTHALIDONE 25 MG PO TABS: 12.5000 mg | ORAL_TABLET | Freq: Every day | ORAL | 3 refills | Status: DC

## 2018-01-06 MED ORDER — ENALAPRIL MALEATE 20 MG PO TABS: 20.0000 mg | ORAL_TABLET | Freq: Every day | ORAL | 3 refills | Status: DC

## 2018-01-06 NOTE — Patient Instructions (Signed)
Medication Instructions:  Your physician has recommended you make the following change in your medication:  1- TAKE Chlorthalidone 12.5 mg (0.5 tablet) by mouth once a day.   Labwork: Your physician recommends that you return for lab work in: TODAY (BMET, CBC).   Testing/Procedures: Your physician has requested that you have an echocardiogram. Echocardiography is a painless test that uses sound waves to create images of your heart. It provides your doctor with information about the size and shape of your heart and how well your heart's chambers and valves are working. This procedure takes approximately one hour. There are no restrictions for this procedure.    Follow-Up: Your physician wants you to follow-up in: [place]. You will receive a reminder letter in the mail two months in advance. If you don't receive a letter, please call our office to schedule the follow-up appointment.  If you need a refill on your cardiac medications before your next appointment, please call your pharmacy.  Echocardiogram An echocardiogram, or echocardiography, uses sound waves (ultrasound) to produce an image of your heart. The echocardiogram is simple, painless, obtained within a short period of time, and offers valuable information to your health care provider. The images from an echocardiogram can provide information such as:  Evidence of coronary artery disease (CAD).  Heart size.  Heart muscle function.  Heart valve function.  Aneurysm detection.  Evidence of a past heart attack.  Fluid buildup around the heart.  Heart muscle thickening.  Assess heart valve function.  Tell a health care provider about:  Any allergies you have.  All medicines you are taking, including vitamins, herbs, eye drops, creams, and over-the-counter medicines.  Any problems you or family members have had with anesthetic medicines.  Any blood disorders you have.  Any surgeries you have  had.  Any medical conditions you have.  Whether you are pregnant or may be pregnant. What happens before the procedure? No special preparation is needed. Eat and drink normally. What happens during the procedure?  In order to produce an image of your heart, gel will be applied to your chest and a wand-like tool (transducer) will be moved over your chest. The gel will help transmit the sound waves from the transducer. The sound waves will harmlessly bounce off your heart to allow the heart images to be captured in real-time motion. These images will then be recorded.  You may need an IV to receive a medicine that improves the quality of the pictures. What happens after the procedure? You may return to your normal schedule including diet, activities, and medicines, unless your health care provider tells you otherwise. This information is not intended to replace advice given to you by your health care provider. Make sure you discuss any questions you have with your health care provider. Document Released: 07/23/2000 Document Revised: 03/13/2016 Document Reviewed: 04/02/2013 Elsevier Interactive Patient Education  2017 [employer].

## 2018-01-07 LAB — CBC WITH DIFFERENTIAL/PLATELET
BASOS: 0 %
Basophils Absolute: 0 10*3/uL (ref 0.0–0.2)
EOS (ABSOLUTE): 0.1 10*3/uL (ref 0.0–0.4)
Eos: 2 %
Hematocrit: 36.3 % — ABNORMAL LOW (ref 37.5–51.0)
Hemoglobin: 12.4 g/dL — ABNORMAL LOW (ref 13.0–17.7)
IMMATURE GRANS (ABS): 0 10*3/uL (ref 0.0–0.1)
IMMATURE GRANULOCYTES: 0 %
LYMPHS: 25 %
Lymphocytes Absolute: 1.3 10*3/uL (ref 0.7–3.1)
MCH: 30.2 pg (ref 26.6–33.0)
MCHC: 34.2 g/dL (ref 31.5–35.7)
MCV: 89 fL (ref 79–97)
MONOCYTES: 8 %
Monocytes Absolute: 0.4 10*3/uL (ref 0.1–0.9)
NEUTROS PCT: 65 %
Neutrophils Absolute: 3.2 10*3/uL (ref 1.4–7.0)
PLATELETS: 202 10*3/uL (ref 150–450)
RBC: 4.1 x10E6/uL — ABNORMAL LOW (ref 4.14–5.80)
RDW: 13.7 % (ref 12.3–15.4)
WBC: 5 10*3/uL (ref 3.4–10.8)

## 2018-01-07 LAB — BASIC METABOLIC PANEL
BUN/Creatinine Ratio: 18 (ref 10–24)
BUN: 32 mg/dL — AB (ref 8–27)
CALCIUM: 9.1 mg/dL (ref 8.6–10.2)
CO2: 19 mmol/L — AB (ref 20–29)
CREATININE: 1.77 mg/dL — AB (ref 0.76–1.27)
Chloride: 109 mmol/L — ABNORMAL HIGH (ref 96–106)
GFR calc Af Amer: 41 mL/min/{1.73_m2} — ABNORMAL LOW (ref >59)
GFR, EST NON AFRICAN AMERICAN: 36 mL/min/{1.73_m2} — AB (ref >59)
Glucose: 122 mg/dL — ABNORMAL HIGH (ref 65–99)
POTASSIUM: 4.4 mmol/L (ref 3.5–5.2)
Sodium: 142 mmol/L (ref 134–144)

## 2018-01-09 ENCOUNTER — Other Ambulatory Visit: Payer: Self-pay | Admitting: *Deleted

## 2018-01-09 DIAGNOSIS — I10 Essential (primary) hypertension: Secondary | ICD-10-CM

## 2018-01-09 DIAGNOSIS — Z79899 Other long term (current) drug therapy: Secondary | ICD-10-CM

## 2018-01-09 DIAGNOSIS — I25119 Atherosclerotic heart disease of native coronary artery with unspecified angina pectoris: Secondary | ICD-10-CM

## 2018-01-16 ENCOUNTER — Other Ambulatory Visit
Admission: RE | Admit: 2018-01-16 | Discharge: 2018-01-16 | Disposition: A | Discharge status: Home / Self Care | Payer: Medicare Other | Source: Ambulatory Visit | Attending: Physician Assistant | Admitting: Physician Assistant

## 2018-01-16 ENCOUNTER — Other Ambulatory Visit: Payer: Self-pay | Admitting: *Deleted

## 2018-01-16 DIAGNOSIS — I10 Essential (primary) hypertension: Secondary | ICD-10-CM

## 2018-01-16 DIAGNOSIS — Z79899 Other long term (current) drug therapy: Secondary | ICD-10-CM

## 2018-01-16 DIAGNOSIS — I25119 Atherosclerotic heart disease of native coronary artery with unspecified angina pectoris: Secondary | ICD-10-CM

## 2018-01-16 DIAGNOSIS — I25111 Atherosclerotic heart disease of native coronary artery with angina pectoris with documented spasm: Secondary | ICD-10-CM

## 2018-01-16 LAB — BASIC METABOLIC PANEL
Anion gap: 7 (ref 5–15)
BUN: 34 mg/dL — ABNORMAL HIGH (ref 6–20)
CALCIUM: 8.8 mg/dL — AB (ref 8.9–10.3)
CHLORIDE: 108 mmol/L (ref 101–111)
CO2: 24 mmol/L (ref 22–32)
CREATININE: 1.62 mg/dL — AB (ref 0.61–1.24)
GFR calc non Af Amer: 39 mL/min — ABNORMAL LOW (ref >60)
GFR, EST AFRICAN AMERICAN: 45 mL/min — AB (ref >60)
Glucose, Bld: 114 mg/dL — ABNORMAL HIGH (ref 65–99)
Potassium: 4.1 mmol/L (ref 3.5–5.1)
SODIUM: 139 mmol/L (ref 135–145)

## 2018-01-16 MED ORDER — CHLORTHALIDONE 25 MG PO TABS: 12.5000 mg | ORAL_TABLET | ORAL | 3 refills | Status: DC

## 2018-01-23 ENCOUNTER — Other Ambulatory Visit: Payer: Self-pay | Admitting: Family Medicine

## 2018-01-23 DIAGNOSIS — K219 Gastro-esophageal reflux disease without esophagitis: Secondary | ICD-10-CM

## 2018-01-30 ENCOUNTER — Other Ambulatory Visit
Admission: RE | Admit: 2018-01-30 | Discharge: 2018-01-30 | Disposition: A | Discharge status: Home / Self Care | Payer: Medicare Other | Source: Ambulatory Visit | Attending: Physician Assistant | Admitting: Physician Assistant

## 2018-01-30 DIAGNOSIS — I10 Essential (primary) hypertension: Secondary | ICD-10-CM | POA: Insufficient documentation

## 2018-01-30 DIAGNOSIS — Z79899 Other long term (current) drug therapy: Secondary | ICD-10-CM | POA: Insufficient documentation

## 2018-01-30 DIAGNOSIS — I25111 Atherosclerotic heart disease of native coronary artery with angina pectoris with documented spasm: Secondary | ICD-10-CM | POA: Diagnosis not present

## 2018-01-30 LAB — BASIC METABOLIC PANEL
Anion gap: 9 (ref 5–15)
BUN: 25 mg/dL — AB (ref 6–20)
CO2: 24 mmol/L (ref 22–32)
Calcium: 9 mg/dL (ref 8.9–10.3)
Chloride: 107 mmol/L (ref 101–111)
Creatinine, Ser: 1.32 mg/dL — ABNORMAL HIGH (ref 0.61–1.24)
GFR calc Af Amer: 58 mL/min — ABNORMAL LOW (ref >60)
GFR, EST NON AFRICAN AMERICAN: 50 mL/min — AB (ref >60)
GLUCOSE: 109 mg/dL — AB (ref 65–99)
Potassium: 4.2 mmol/L (ref 3.5–5.1)
Sodium: 140 mmol/L (ref 135–145)

## 2018-02-01 DIAGNOSIS — D225 Melanocytic nevi of trunk: Secondary | ICD-10-CM | POA: Diagnosis not present

## 2018-02-01 DIAGNOSIS — Z85828 Personal history of other malignant neoplasm of skin: Secondary | ICD-10-CM | POA: Diagnosis not present

## 2018-02-01 DIAGNOSIS — S81809D Unspecified open wound, unspecified lower leg, subsequent encounter: Secondary | ICD-10-CM | POA: Diagnosis not present

## 2018-02-01 DIAGNOSIS — C44529 Squamous cell carcinoma of skin of other part of trunk: Secondary | ICD-10-CM | POA: Diagnosis not present

## 2018-02-02 ENCOUNTER — Other Ambulatory Visit: Payer: Self-pay | Admitting: *Deleted

## 2018-02-02 MED ORDER — FUROSEMIDE 20 MG PO TABS: ORAL_TABLET | ORAL | 6 refills | Status: DC

## 2018-02-06 ENCOUNTER — Ambulatory Visit (INDEPENDENT_AMBULATORY_CARE_PROVIDER_SITE_OTHER): Payer: Medicare Other

## 2018-02-06 ENCOUNTER — Other Ambulatory Visit: Payer: Self-pay

## 2018-02-06 DIAGNOSIS — I359 Nonrheumatic aortic valve disorder, unspecified: Secondary | ICD-10-CM

## 2018-02-15 DIAGNOSIS — L570 Actinic keratosis: Secondary | ICD-10-CM | POA: Diagnosis not present

## 2018-02-15 DIAGNOSIS — S81802S Unspecified open wound, left lower leg, sequela: Secondary | ICD-10-CM | POA: Diagnosis not present

## 2018-02-15 DIAGNOSIS — Z85828 Personal history of other malignant neoplasm of skin: Secondary | ICD-10-CM | POA: Diagnosis not present

## 2018-02-23 ENCOUNTER — Other Ambulatory Visit: Payer: Self-pay | Admitting: Family Medicine

## 2018-02-23 DIAGNOSIS — K219 Gastro-esophageal reflux disease without esophagitis: Secondary | ICD-10-CM

## 2018-03-22 ENCOUNTER — Other Ambulatory Visit: Payer: Self-pay | Admitting: Physician Assistant

## 2018-03-22 DIAGNOSIS — K219 Gastro-esophageal reflux disease without esophagitis: Secondary | ICD-10-CM

## 2018-03-23 NOTE — Telephone Encounter (Signed)
For Dennis 

## 2018-05-04 DIAGNOSIS — L820 Inflamed seborrheic keratosis: Secondary | ICD-10-CM | POA: Diagnosis not present

## 2018-05-04 DIAGNOSIS — Z1283 Encounter for screening for malignant neoplasm of skin: Secondary | ICD-10-CM | POA: Diagnosis not present

## 2018-05-04 DIAGNOSIS — L814 Other melanin hyperpigmentation: Secondary | ICD-10-CM | POA: Diagnosis not present

## 2018-05-04 DIAGNOSIS — Z85828 Personal history of other malignant neoplasm of skin: Secondary | ICD-10-CM | POA: Diagnosis not present

## 2018-05-04 DIAGNOSIS — L821 Other seborrheic keratosis: Secondary | ICD-10-CM | POA: Diagnosis not present

## 2018-05-04 DIAGNOSIS — L578 Other skin changes due to chronic exposure to nonionizing radiation: Secondary | ICD-10-CM | POA: Diagnosis not present

## 2018-05-04 DIAGNOSIS — L570 Actinic keratosis: Secondary | ICD-10-CM | POA: Diagnosis not present

## 2018-05-04 DIAGNOSIS — D485 Neoplasm of uncertain behavior of skin: Secondary | ICD-10-CM | POA: Diagnosis not present

## 2018-05-04 DIAGNOSIS — C44619 Basal cell carcinoma of skin of left upper limb, including shoulder: Secondary | ICD-10-CM | POA: Diagnosis not present

## 2018-06-07 DIAGNOSIS — S81802S Unspecified open wound, left lower leg, sequela: Secondary | ICD-10-CM | POA: Diagnosis not present

## 2018-06-07 DIAGNOSIS — L988 Other specified disorders of the skin and subcutaneous tissue: Secondary | ICD-10-CM | POA: Diagnosis not present

## 2018-06-07 DIAGNOSIS — C44619 Basal cell carcinoma of skin of left upper limb, including shoulder: Secondary | ICD-10-CM | POA: Diagnosis not present

## 2018-06-07 DIAGNOSIS — C44519 Basal cell carcinoma of skin of other part of trunk: Secondary | ICD-10-CM | POA: Diagnosis not present

## 2018-06-09 ENCOUNTER — Ambulatory Visit: Payer: Medicare Other | Admitting: Podiatry

## 2018-06-09 ENCOUNTER — Ambulatory Visit (INDEPENDENT_AMBULATORY_CARE_PROVIDER_SITE_OTHER): Payer: Medicare Other

## 2018-06-09 ENCOUNTER — Encounter

## 2018-06-09 ENCOUNTER — Encounter: Payer: Self-pay | Admitting: Podiatry

## 2018-06-09 DIAGNOSIS — M19071 Primary osteoarthritis, right ankle and foot: Secondary | ICD-10-CM

## 2018-06-09 DIAGNOSIS — L97312 Non-pressure chronic ulcer of right ankle with fat layer exposed: Secondary | ICD-10-CM

## 2018-06-12 NOTE — Progress Notes (Signed)
   Subjective:  82 year old male presenting today with a chief complaint of burning, stinging pain of a wound located on the medial right ankle that appeared about 3 months ago. He states the pain is constant. Walking and standing increases the pain. He has been applying Vaseline and Neosporin to the area for treatment. Patient is here for further evaluation and treatment.   Past Medical History:  Diagnosis Date  . Acid reflux   . Aortic insufficiency    a. 03/2016 Echo: EF 55-60%, no rwma, Gr1 DD, mild to mod AI, mild TR.  Marland Kitchen CAD (coronary artery disease)    a. 2000 Cath: Severe distal LAD disease with left to left collaterals;  b. 2008 MV: non-ischemic; c. 03/2016 Cath: LM nl, LAD 30p, D1/D2 mod, nl, LCX nl, OM1/2 nl w/ L-->L collats to apical LAD, RCA dominant, nl. EF 55-65%.  . Hypertensive heart disease 2005   unspecified  . Mixed hyperlipidemia    mixed     Objective/Physical Exam General: The patient is alert and oriented x3 in no acute distress.  Dermatology:  Wound #1 noted to the medial right ankle measuring 2.0 x 2.0 x 0.2 cm (LxWxD).   To the noted ulceration(s), there is no eschar. There is a moderate amount of slough, fibrin, and necrotic tissue noted. Granulation tissue and wound base is red. There is a minimal amount of serosanguineous drainage noted. There is no exposed bone muscle-tendon ligament or joint. There is no malodor. Periwound integrity is intact. Skin is warm, dry and supple bilateral lower extremities.  Vascular: Palpable pedal pulses bilaterally. Mild edema noted. Capillary refill within normal limits. Varicosities noted bilateral lower extremities.   Neurological: Epicritic and protective threshold diminished bilaterally.   Musculoskeletal Exam: Range of motion within normal limits to all pedal and ankle joints bilateral. Muscle strength 5/5 in all groups bilateral.   Radiographic Exam:  Normal osseous mineralization. Joint spaces preserved. No  fracture/dislocation/boney destruction.        Assessment: #1 ulceration of the medial right ankle secondary to venous insufficiency #2 varicosities bilateral lower extremities  Plan of Care:  #1 Patient was evaluated. #2 medically necessary excisional debridement including subcutaneous tissue was performed using a tissue nipper and a chisel blade. Excisional debridement of all the necrotic nonviable tissue down to healthy bleeding viable tissue was performed with post-debridement measurements same as pre-. #3 the wound was cleansed with normal saline. #4 Collagen applied with a bandage.  #5 Continue using compression hose daily.  #6 Return to clinic in 2 weeks.    Edrick Kins, DPM [facility]  Dr. Edrick Kins, [address]                                        [place], [place] [address]                Office [phone]  Fax [phone]

## 2018-06-21 DIAGNOSIS — C44519 Basal cell carcinoma of skin of other part of trunk: Secondary | ICD-10-CM | POA: Diagnosis not present

## 2018-06-23 ENCOUNTER — Ambulatory Visit: Payer: Medicare Other | Admitting: Podiatry

## 2018-06-23 ENCOUNTER — Encounter: Payer: Self-pay | Admitting: Podiatry

## 2018-06-23 DIAGNOSIS — L97519 Non-pressure chronic ulcer of other part of right foot with unspecified severity: Secondary | ICD-10-CM

## 2018-06-23 DIAGNOSIS — I83015 Varicose veins of right lower extremity with ulcer other part of foot: Secondary | ICD-10-CM

## 2018-06-23 DIAGNOSIS — L97312 Non-pressure chronic ulcer of right ankle with fat layer exposed: Secondary | ICD-10-CM

## 2018-06-26 NOTE — Progress Notes (Signed)
   Subjective:  82 year old male presenting today for follow up evaluation of an ulceration of the right ankle. He states he is doing well and the wound is improving and is not as red as it previously was. He has been applying collagen and using compression hose as directed. Patient is here for further evaluation and treatment.   Past Medical History:  Diagnosis Date  . Acid reflux   . Aortic insufficiency    a. 03/2016 Echo: EF 55-60%, no rwma, Gr1 DD, mild to mod AI, mild TR.  Marland Kitchen CAD (coronary artery disease)    a. 2000 Cath: Severe distal LAD disease with left to left collaterals;  b. 2008 MV: non-ischemic; c. 03/2016 Cath: LM nl, LAD 30p, D1/D2 mod, nl, LCX nl, OM1/2 nl w/ L-->L collats to apical LAD, RCA dominant, nl. EF 55-65%.  . Hypertensive heart disease 2005   unspecified  . Mixed hyperlipidemia    mixed     Objective/Physical Exam General: The patient is alert and oriented x3 in no acute distress.  Dermatology:  Wound #1 noted to the medial right ankle measuring 1.7 x 1.7 x 0.1 cm (LxWxD).   To the noted ulceration(s), there is no eschar. There is a moderate amount of slough, fibrin, and necrotic tissue noted. Granulation tissue and wound base is red. There is a minimal amount of serosanguineous drainage noted. There is no exposed bone muscle-tendon ligament or joint. There is no malodor. Periwound integrity is intact. Skin is warm, dry and supple bilateral lower extremities.  Vascular: Palpable pedal pulses bilaterally. Mild edema noted. Capillary refill within normal limits. Varicosities noted bilateral lower extremities.   Neurological: Epicritic and protective threshold diminished bilaterally.   Musculoskeletal Exam: Range of motion within normal limits to all pedal and ankle joints bilateral. Muscle strength 5/5 in all groups bilateral.   Assessment: #1 ulceration of the medial right ankle secondary to venous insufficiency #2 varicosities bilateral lower  extremities  Plan of Care:  #1 Patient was evaluated. #2 medically necessary excisional debridement including subcutaneous tissue was performed using a tissue nipper and a chisel blade. Excisional debridement of all the necrotic nonviable tissue down to healthy bleeding viable tissue was performed with post-debridement measurements same as pre-. #3 the wound was cleansed with normal saline. #4 Continue using collagen daily with a bandage.  #5 Continue using compression hose daily.  #6 Return to clinic in 4 weeks.    Edrick Kins, DPM [facility]  Dr. Edrick Kins, [address]                                        [place], [place] [address]                Office [phone]  Fax [phone]

## 2018-07-25 ENCOUNTER — Ambulatory Visit: Payer: Medicare Other | Admitting: Podiatry

## 2018-07-25 DIAGNOSIS — L97519 Non-pressure chronic ulcer of other part of right foot with unspecified severity: Secondary | ICD-10-CM

## 2018-07-25 DIAGNOSIS — I83015 Varicose veins of right lower extremity with ulcer other part of foot: Secondary | ICD-10-CM | POA: Diagnosis not present

## 2018-07-25 DIAGNOSIS — L97312 Non-pressure chronic ulcer of right ankle with fat layer exposed: Secondary | ICD-10-CM

## 2018-07-27 NOTE — Progress Notes (Signed)
   Subjective:  82 year old male presenting today for follow up evaluation of an ulceration of the right ankle. He states the wound looks about the same. He denies any significant pain or modifying factors. He has been using collagen as directed. Patient is here for further evaluation and treatment.   Past Medical History:  Diagnosis Date  . Acid reflux   . Aortic insufficiency    a. 03/2016 Echo: EF 55-60%, no rwma, Gr1 DD, mild to mod AI, mild TR.  Marland Kitchen CAD (coronary artery disease)    a. 2000 Cath: Severe distal LAD disease with left to left collaterals;  b. 2008 MV: non-ischemic; c. 03/2016 Cath: LM nl, LAD 30p, D1/D2 mod, nl, LCX nl, OM1/2 nl w/ L-->L collats to apical LAD, RCA dominant, nl. EF 55-65%.  . Hypertensive heart disease 2005   unspecified  . Mixed hyperlipidemia    mixed     Objective/Physical Exam General: The patient is alert and oriented x3 in no acute distress.  Dermatology:  Wound #1 noted to the medial right ankle measuring 1.6 x 1.6 x 0.1 cm (LxWxD).   To the noted ulceration(s), there is no eschar. There is a moderate amount of slough, fibrin, and necrotic tissue noted. Granulation tissue and wound base is red. There is a minimal amount of serosanguineous drainage noted. There is no exposed bone muscle-tendon ligament or joint. There is no malodor. Periwound integrity is intact. Skin is warm, dry and supple bilateral lower extremities.  Vascular: Palpable pedal pulses bilaterally. Mild edema noted. Capillary refill within normal limits. Varicosities noted bilateral lower extremities.   Neurological: Epicritic and protective threshold diminished bilaterally.   Musculoskeletal Exam: Range of motion within normal limits to all pedal and ankle joints bilateral. Muscle strength 5/5 in all groups bilateral.   Assessment: #1 ulceration of the medial right ankle secondary to venous insufficiency #2 varicosities bilateral lower extremities  Plan of Care:  #1 Patient  was evaluated. #2 medically necessary excisional debridement including subcutaneous tissue was performed using a tissue nipper and a chisel blade. Excisional debridement of all the necrotic nonviable tissue down to healthy bleeding viable tissue was performed with post-debridement measurements same as pre-. #3 the wound was cleansed with normal saline. #4 Continue using collagen daily with a dry sterile dressing.  #5 Continue using compression hose daily.  #6 Return to clinic in 3 weeks.    Edrick Kins, DPM [facility]  Dr. Edrick Kins, [address]                                        [place], [place] [address]                Office [phone]  Fax [phone]

## 2018-08-10 DIAGNOSIS — Z1283 Encounter for screening for malignant neoplasm of skin: Secondary | ICD-10-CM | POA: Diagnosis not present

## 2018-08-10 DIAGNOSIS — L570 Actinic keratosis: Secondary | ICD-10-CM | POA: Diagnosis not present

## 2018-08-10 DIAGNOSIS — Z85828 Personal history of other malignant neoplasm of skin: Secondary | ICD-10-CM | POA: Diagnosis not present

## 2018-08-10 DIAGNOSIS — L821 Other seborrheic keratosis: Secondary | ICD-10-CM | POA: Diagnosis not present

## 2018-08-10 DIAGNOSIS — D485 Neoplasm of uncertain behavior of skin: Secondary | ICD-10-CM | POA: Diagnosis not present

## 2018-08-10 DIAGNOSIS — C44212 Basal cell carcinoma of skin of right ear and external auricular canal: Secondary | ICD-10-CM | POA: Diagnosis not present

## 2018-08-18 ENCOUNTER — Encounter: Payer: Self-pay | Admitting: Podiatry

## 2018-08-18 ENCOUNTER — Ambulatory Visit: Payer: Medicare Other | Admitting: Podiatry

## 2018-08-18 DIAGNOSIS — I83015 Varicose veins of right lower extremity with ulcer other part of foot: Secondary | ICD-10-CM

## 2018-08-18 DIAGNOSIS — L97519 Non-pressure chronic ulcer of other part of right foot with unspecified severity: Secondary | ICD-10-CM

## 2018-08-18 DIAGNOSIS — L97312 Non-pressure chronic ulcer of right ankle with fat layer exposed: Secondary | ICD-10-CM

## 2018-08-20 NOTE — Progress Notes (Signed)
   Subjective:  83 year old male presenting today for follow up evaluation of an ulceration of the right ankle. He states he is doing well. He denies any significant pain or modifying factors. He has been using collagen as directed. Patient is here for further evaluation and treatment.   Past Medical History:  Diagnosis Date  . Acid reflux   . Aortic insufficiency    a. 03/2016 Echo: EF 55-60%, no rwma, Gr1 DD, mild to mod AI, mild TR.  Marland Kitchen CAD (coronary artery disease)    a. 2000 Cath: Severe distal LAD disease with left to left collaterals;  b. 2008 MV: non-ischemic; c. 03/2016 Cath: LM nl, LAD 30p, D1/D2 mod, nl, LCX nl, OM1/2 nl w/ L-->L collats to apical LAD, RCA dominant, nl. EF 55-65%.  . Hypertensive heart disease 2005   unspecified  . Mixed hyperlipidemia    mixed     Objective/Physical Exam General: The patient is alert and oriented x3 in no acute distress.  Dermatology:  Wound #1 noted to the medial right ankle measuring 1.4 x 1.4 x 0.2 cm (LxWxD).   To the noted ulceration(s), there is no eschar. There is a moderate amount of slough, fibrin, and necrotic tissue noted. Granulation tissue and wound base is red. There is a minimal amount of serosanguineous drainage noted. There is no exposed bone muscle-tendon ligament or joint. There is no malodor. Periwound integrity is intact. Skin is warm, dry and supple bilateral lower extremities.  Vascular: Palpable pedal pulses bilaterally. Mild edema noted. Capillary refill within normal limits. Varicosities noted bilateral lower extremities.   Neurological: Epicritic and protective threshold diminished bilaterally.   Musculoskeletal Exam: Range of motion within normal limits to all pedal and ankle joints bilateral. Muscle strength 5/5 in all groups bilateral.   Assessment: #1 ulceration of the medial right ankle secondary to venous insufficiency #2 varicosities bilateral lower extremities  Plan of Care:  #1 Patient was  evaluated. #2 medically necessary excisional debridement including subcutaneous tissue was performed using a tissue nipper and a chisel blade. Excisional debridement of all the necrotic nonviable tissue down to healthy bleeding viable tissue was performed with post-debridement measurements same as pre-. #3 the wound was cleansed with normal saline. #4 Continue using collagen daily with a dry sterile dressing.  #5 Continue using compression hose daily.  #6 Return to clinic in 3 weeks.    Edrick Kins, DPM [facility]  Dr. Edrick Kins, [address]                                        [place], [place] [address]                Office [phone]  Fax [phone]

## 2018-08-26 ENCOUNTER — Other Ambulatory Visit: Payer: Self-pay | Admitting: Physician Assistant

## 2018-09-15 ENCOUNTER — Ambulatory Visit: Payer: Medicare Other | Admitting: Podiatry

## 2018-09-19 ENCOUNTER — Ambulatory Visit: Payer: Medicare Other | Admitting: Podiatry

## 2018-09-19 ENCOUNTER — Encounter: Payer: Self-pay | Admitting: Podiatry

## 2018-09-19 DIAGNOSIS — L97519 Non-pressure chronic ulcer of other part of right foot with unspecified severity: Secondary | ICD-10-CM

## 2018-09-19 DIAGNOSIS — I83015 Varicose veins of right lower extremity with ulcer other part of foot: Secondary | ICD-10-CM

## 2018-09-19 DIAGNOSIS — L97312 Non-pressure chronic ulcer of right ankle with fat layer exposed: Secondary | ICD-10-CM | POA: Diagnosis not present

## 2018-09-25 NOTE — Progress Notes (Signed)
   Subjective:  83 year old male presenting today for follow up evaluation of an ulceration of the right ankle. He states the wound seems to be improving. He reports some associated scabbing of the area. He denies any pain or modifying factors. He has been using collagen and compression hose as directed. Patient is here for further evaluation and treatment.    Past Medical History:  Diagnosis Date  . Acid reflux   . Aortic insufficiency    a. 03/2016 Echo: EF 55-60%, no rwma, Gr1 DD, mild to mod AI, mild TR.  Marland Kitchen CAD (coronary artery disease)    a. 2000 Cath: Severe distal LAD disease with left to left collaterals;  b. 2008 MV: non-ischemic; c. 03/2016 Cath: LM nl, LAD 30p, D1/D2 mod, nl, LCX nl, OM1/2 nl w/ L-->L collats to apical LAD, RCA dominant, nl. EF 55-65%.  . Hypertensive heart disease 2005   unspecified  . Mixed hyperlipidemia    mixed     Objective/Physical Exam General: The patient is alert and oriented x3 in no acute distress.  Dermatology:  Wound #1 noted to the medial right ankle measuring 1.2 x 1.0 x 0.2 cm (LxWxD).   To the noted ulceration(s), there is no eschar. There is a moderate amount of slough, fibrin, and necrotic tissue noted. Granulation tissue and wound base is red. There is a minimal amount of serosanguineous drainage noted. There is no exposed bone muscle-tendon ligament or joint. There is no malodor. Periwound integrity is intact. Skin is warm, dry and supple bilateral lower extremities.  Vascular: Palpable pedal pulses bilaterally. Mild edema noted. Capillary refill within normal limits. Varicosities noted bilateral lower extremities.   Neurological: Epicritic and protective threshold diminished bilaterally.   Musculoskeletal Exam: Range of motion within normal limits to all pedal and ankle joints bilateral. Muscle strength 5/5 in all groups bilateral.   Assessment: #1 ulceration of the medial right ankle secondary to venous insufficiency #2  varicosities bilateral lower extremities  Plan of Care:  #1 Patient was evaluated. #2 medically necessary excisional debridement including subcutaneous tissue was performed using a tissue nipper and a chisel blade. Excisional debridement of all the necrotic nonviable tissue down to healthy bleeding viable tissue was performed with post-debridement measurements same as pre-. #3 the wound was cleansed with normal saline. #4 Continue using collagen daily with a dry sterile dressing.  #5 Continue using compression hose daily.  #6 Return to clinic in 3 weeks.    Edrick Kins, DPM [facility]  Dr. Edrick Kins, [address]                                        [place], [place] [address]                Office [phone]  Fax [phone]

## 2018-10-02 ENCOUNTER — Other Ambulatory Visit: Payer: Self-pay | Admitting: Family Medicine

## 2018-10-02 DIAGNOSIS — E7800 Pure hypercholesterolemia, unspecified: Secondary | ICD-10-CM

## 2018-10-10 ENCOUNTER — Ambulatory Visit: Payer: Medicare Other | Admitting: Podiatry

## 2018-10-10 ENCOUNTER — Encounter: Payer: Self-pay | Admitting: Podiatry

## 2018-10-10 DIAGNOSIS — I83015 Varicose veins of right lower extremity with ulcer other part of foot: Secondary | ICD-10-CM | POA: Diagnosis not present

## 2018-10-10 DIAGNOSIS — L97312 Non-pressure chronic ulcer of right ankle with fat layer exposed: Secondary | ICD-10-CM

## 2018-10-10 DIAGNOSIS — L97519 Non-pressure chronic ulcer of other part of right foot with unspecified severity: Secondary | ICD-10-CM

## 2018-10-12 NOTE — Progress Notes (Signed)
   Subjective:  83 year old male presenting today for follow up evaluation of an ulceration of the right ankle. He states he is doing well overall. He reports some associated drainage from the wound. He has been using the collagen and compression socks as directed. There are no modifying factors noted. Patient is here for further evaluation and treatment.    Past Medical History:  Diagnosis Date  . Acid reflux   . Aortic insufficiency    a. 03/2016 Echo: EF 55-60%, no rwma, Gr1 DD, mild to mod AI, mild TR.  Marland Kitchen CAD (coronary artery disease)    a. 2000 Cath: Severe distal LAD disease with left to left collaterals;  b. 2008 MV: non-ischemic; c. 03/2016 Cath: LM nl, LAD 30p, D1/D2 mod, nl, LCX nl, OM1/2 nl w/ L-->L collats to apical LAD, RCA dominant, nl. EF 55-65%.  . Hypertensive heart disease 2005   unspecified  . Mixed hyperlipidemia    mixed     Objective/Physical Exam General: The patient is alert and oriented x3 in no acute distress.  Dermatology:  Wound #1 noted to the medial right ankle measuring 1.2 x 1.0 x 0.2 cm (LxWxD).   To the noted ulceration(s), there is no eschar. There is a moderate amount of slough, fibrin, and necrotic tissue noted. Granulation tissue and wound base is red. There is a minimal amount of serosanguineous drainage noted. There is no exposed bone muscle-tendon ligament or joint. There is no malodor. Periwound integrity is intact. Skin is warm, dry and supple bilateral lower extremities.  Vascular: Palpable pedal pulses bilaterally. Mild edema noted. Capillary refill within normal limits. Varicosities noted bilateral lower extremities.   Neurological: Epicritic and protective threshold diminished bilaterally.   Musculoskeletal Exam: Range of motion within normal limits to all pedal and ankle joints bilateral. Muscle strength 5/5 in all groups bilateral.   Assessment: #1 ulceration of the medial right ankle secondary to venous insufficiency #2 varicosities  bilateral lower extremities  Plan of Care:  #1 Patient was evaluated. #2 medically necessary excisional debridement including subcutaneous tissue was performed using a tissue nipper and a chisel blade. Excisional debridement of all the necrotic nonviable tissue down to healthy bleeding viable tissue was performed with post-debridement measurements same as pre-. #3 the wound was cleansed with normal saline. #4 Continue using collagen daily with a dry sterile dressing.  #5 Continue using compression socks daily.  #6 Authorization request placed for amniotic skin graft.  #7 Return to clinic in 4 weeks.    Edrick Kins, DPM [facility]  Dr. Edrick Kins, [address]                                        [place], [place] [address]                Office [phone]  Fax [phone]

## 2018-11-07 ENCOUNTER — Telehealth: Payer: Self-pay

## 2018-11-07 ENCOUNTER — Other Ambulatory Visit: Payer: Self-pay

## 2018-11-07 ENCOUNTER — Ambulatory Visit: Payer: Medicare Other | Admitting: Podiatry

## 2018-11-07 MED ORDER — CARVEDILOL 12.5 MG PO TABS: 12.5000 mg | ORAL_TABLET | Freq: Two times a day (BID) | ORAL | 3 refills | Status: DC

## 2018-11-07 NOTE — Telephone Encounter (Signed)
*  STAT* If patient is at the pharmacy, call can be transferred to refill team.   1. Which medications need to be refilled? (please list name of each medication and dose if known) Coreg  2. Which pharmacy/location (including street and city if local pharmacy) is medication to be sent to? [facility]  3. Do they need a 30 day or 90 day supply? [place]

## 2018-11-07 NOTE — Telephone Encounter (Signed)
Telephone E-visit Consent obtained verbally.   YOUR CARDIOLOGY TEAM HAS ARRANGED FOR AN E-VISIT FOR YOUR APPOINTMENT - PLEASE REVIEW IMPORTANT INFORMATION BELOW SEVERAL DAYS PRIOR TO YOUR APPOINTMENT  Due to the recent COVID-19 pandemic, we are transitioning in-person office visits to tele-medicine visits in an effort to decrease unnecessary exposure to our patients and staff. Medicare and most insurances are covering these visits without a copay needed. We also encourage you to sign up for MyChart if you have not already done so. You will need a smartphone if possible. For patients that do not have this, we can still complete the visit using a regular telephone but do prefer a smartphone to enable video when possible. You may have a close family member that lives with you that can help. If possible, we also ask that you have a blood pressure cuff and scale at home to measure your blood pressure, heart rate and weight prior to your scheduled appointment. Patients with clinical needs that need an in-person evaluation and testing will still be able to come to the office if absolutely necessary. If you have any questions, feel free to call our office.    IF YOU HAVE A SMARTPHONE, PLEASE DOWNLOAD THE WEBEX APP TO YOUR SMARTPHONE  - If Apple, go to [employer] and type in WebEx in the search bar. [place] [employer], the blue/green circle. The app is free but as with any other app download, your phone may require you to verify saved payment information or Apple password. You do NOT have to create a WebEx account.  - If Android, go to [employer] and type in [employer] in the search bar. [place] [employer], the blue/green circle. The app is free but as with any other app download, your phone may require you to verify saved payment information or Android password. You do NOT have to create a WebEx account.  It is very helpful to have this downloaded before your visit.    2-3 DAYS  BEFORE YOUR APPOINTMENT  You will receive a telephone call from one of our [facility] team members - your caller ID may say "Unknown caller." If this is a video visit, we will confirm that you have been able to download the WebEx app. We will remind you check your blood pressure, heart rate and weight prior to your scheduled appointment. If you have an Apple Watch or Kardia, please upload any pertinent ECG strips the day before or morning of your appointment to [facility]. Our staff will also make sure you have reviewed the consent and agree to move forward with your scheduled tele-health visit.     THE DAY OF YOUR APPOINTMENT  Approximately 15 minutes prior to your scheduled appointment, you will receive a telephone call from one of [facility] team - your caller ID may say "Unknown caller."  Our staff will confirm medications, vital signs for the day and any symptoms you may be experiencing. Please have this information available prior to the time of visit start. It may also be helpful for you to have a pad of paper and pen handy for any instructions given during your visit. They will also walk you through joining the WebEx smartphone meeting if this is a video visit.    CONSENT FOR TELE-HEALTH VISIT - PLEASE REVIEW  I hereby voluntarily request, consent and authorize [facility] and its employed or contracted physicians, physician assistants, nurse practitioners or other licensed health care professionals (the Practitioner), to provide me with  telemedicine health care services (the "Services") as deemed necessary by the treating Practitioner. I acknowledge and consent to receive the Services by the Practitioner via telemedicine. I understand that the telemedicine visit will involve communicating with the Practitioner through live audiovisual communication technology and the disclosure of certain medical information by electronic transmission. I acknowledge that I have been given the opportunity to  request an in-person assessment or other available alternative prior to the telemedicine visit and am voluntarily participating in the telemedicine visit.  I understand that I have the right to withhold or withdraw my consent to the use of telemedicine in the course of my care at any time, without affecting my right to future care or treatment, and that the Practitioner or I may terminate the telemedicine visit at any time. I understand that I have the right to inspect all information obtained and/or recorded in the course of the telemedicine visit and may receive copies of available information for a reasonable fee.  I understand that some of the potential risks of receiving the Services via telemedicine include:  Marland Kitchen Delay or interruption in medical evaluation due to technological equipment failure or disruption; . Information transmitted may not be sufficient (e.g. poor resolution of images) to allow for appropriate medical decision making by the Practitioner; and/or  . In rare instances, security protocols could fail, causing a breach of personal health information.  Furthermore, I acknowledge that it is my responsibility to provide information about my medical history, conditions and care that is complete and accurate to the best of my ability. I acknowledge that Practitioner's advice, recommendations, and/or decision may be based on factors not within their control, such as incomplete or inaccurate data provided by me or distortions of diagnostic images or specimens that may result from electronic transmissions. I understand that the practice of medicine is not an exact science and that Practitioner makes no warranties or guarantees regarding treatment outcomes. I acknowledge that I will receive a copy of this consent concurrently upon execution via email to the email address I last provided but may also request a printed copy by calling the office of [facility].    I understand that my insurance  will be billed for this visit.   I have read or had this consent read to me. . I understand the contents of this consent, which adequately explains the benefits and risks of the Services being provided via telemedicine.  . I have been provided ample opportunity to ask questions regarding this consent and the Services and have had my questions answered to my satisfaction. . I give my informed consent for the services to be provided through the use of telemedicine in my medical care  By participating in this telemedicine visit I agree to the above. Yes

## 2018-11-08 ENCOUNTER — Other Ambulatory Visit: Payer: Self-pay

## 2018-11-08 ENCOUNTER — Telehealth (INDEPENDENT_AMBULATORY_CARE_PROVIDER_SITE_OTHER): Payer: Medicare Other | Admitting: Cardiovascular Disease

## 2018-11-08 ENCOUNTER — Encounter: Payer: Self-pay | Admitting: Cardiovascular Disease

## 2018-11-08 VITALS — BP 167/78 | HR 58 | Ht 70.0 in | Wt 185.0 lb

## 2018-11-08 DIAGNOSIS — I2510 Atherosclerotic heart disease of native coronary artery without angina pectoris: Secondary | ICD-10-CM | POA: Diagnosis not present

## 2018-11-08 DIAGNOSIS — E7800 Pure hypercholesterolemia, unspecified: Secondary | ICD-10-CM

## 2018-11-08 DIAGNOSIS — I359 Nonrheumatic aortic valve disorder, unspecified: Secondary | ICD-10-CM

## 2018-11-08 MED ORDER — ENALAPRIL MALEATE 20 MG PO TABS: 20.0000 mg | ORAL_TABLET | Freq: Every day | ORAL | 3 refills | Status: DC

## 2018-11-08 MED ORDER — ATORVASTATIN CALCIUM 20 MG PO TABS: ORAL_TABLET | ORAL | 1 refills | Status: DC

## 2018-11-08 NOTE — Progress Notes (Signed)
Virtual Visit via Telephone Note    Evaluation Performed:  Follow-up visit  This visit type was conducted due to national recommendations for restrictions regarding the COVID-19 Pandemic (e.g. social distancing).  This format is felt to be most appropriate for this patient at this time.  All issues noted in this document were discussed and addressed.  No physical exam was performed (except for noted visual exam findings with Video Visits).  Please refer to the patient's chart (MyChart message for video visits and phone note for telephone visits) for the patient's consent to telehealth for [facility].  Date:  11/08/2018   ID:  James Mora, DOB 17-Oct-1935, MRN [id number]  Patient Location:    Provider location:   [facility]  PCP:  [name], PA  Cardiologist: Dr. Fletcher Anon Electrophysiologist:  None   Chief Complaint: Follow-up visit  History of Present Illness:    James Mora is a 83 y.o. male who presents via audio/video conferencing for a telehealth visit today.   He has known history of CAD as detailed below, aortic insufficiency, hypertensive heart disease, CKD stage II, and hyperlipidemia who presents for follow-up of his CAD and hypertension.  Cardiac catheterization in 2000 showed severe distal LAD disease with left to left collaterals that was managed medically at that time.  He was admitted to the hospital with a non-STEMI in 03/2016 with a peak troponin of 3.55.  LHC showed mild proximal LAD disease with 30% stenosis and possible occlusion of a small PL 3.  EF was normal.  Medical management was recommended.  TTE showed normal LV systolic function with stable mild to moderate aortic insufficiency.   He had significant leg edema that improved after stopping amlodipine.  He was placed on chlorthalidone but he stopped taking the medication as the swelling resolved after he started using support stockings.  He does have underlying chronic kidney disease.  He has  been doing extremely well with no chest pain, shortness of breath or palpitations.  He walks 30 minutes daily for exercise with no symptoms.  The patient does not have symptoms concerning for COVID-19 infection (fever, chills, cough, or new shortness of breath).    Prior CV studies:   The following studies were reviewed today:    Past Medical History:  Diagnosis Date  . Acid reflux   . Aortic insufficiency    a. 03/2016 Echo: EF 55-60%, no rwma, Gr1 DD, mild to mod AI, mild TR.  Marland Kitchen CAD (coronary artery disease)    a. 2000 Cath: Severe distal LAD disease with left to left collaterals;  b. 2008 MV: non-ischemic; c. 03/2016 Cath: LM nl, LAD 30p, D1/D2 mod, nl, LCX nl, OM1/2 nl w/ L-->L collats to apical LAD, RCA dominant, nl. EF 55-65%.  . Hypertensive heart disease 2005   unspecified  . Mixed hyperlipidemia    mixed   Past Surgical History:  Procedure Laterality Date  . CARDIAC CATHETERIZATION  2000   left heart  . CARDIAC CATHETERIZATION N/A 03/22/2016   Procedure: Left Heart Cath And Coronary Angiography;  Surgeon: Minna Merritts, MD;  Location: [facility];  Service: Cardiovascular;  Laterality: N/A;  . COLONOSCOPY  2008   Dr. Allen Norris     Current Meds  Medication Sig  . aspirin 81 MG tablet Take 81 mg by mouth daily.  Marland Kitchen atorvastatin (LIPITOR) 20 MG tablet TAKE 1 TABLET(20 MG) BY MOUTH DAILY  . carvedilol (COREG) 12.5 MG tablet Take 1 tablet (12.5  mg total) by mouth 2 (two) times daily.  . enalapril (VASOTEC) 20 MG tablet Take 1 tablet (20 mg total) by mouth daily.  . nitroGLYCERIN (NITROSTAT) 0.4 MG SL tablet Place 0.4 mg under the tongue every 5 (five) minutes as needed.    Marland Kitchen omeprazole (PRILOSEC) 20 MG capsule TAKE 2 CAPSULES BY MOUTH EVERY DAY     Allergies:   Patient has no known allergies.   Social History   Tobacco Use  . Smoking status: Never Smoker  . Smokeless tobacco: Never Used  Substance Use Topics  . Alcohol use: No  . Drug use: No     Family  Hx: The patient's Family history is unknown by patient.  ROS:   Please see the history of present illness.     All other systems reviewed and are negative.   Labs/Other Tests and Data Reviewed:    Recent Labs: 01/06/2018: Hemoglobin 12.4; Platelets 202 01/30/2018: BUN 25; Creatinine, Ser 1.32; Potassium 4.2; Sodium 140   Recent Lipid Panel Lab Results  Component Value Date/Time   CHOL 124 06/20/2017 09:59 AM   CHOL 119 12/16/2016 09:55 AM   TRIG 148 06/20/2017 09:59 AM   HDL 43 06/20/2017 09:59 AM   HDL 43 12/16/2016 09:55 AM   CHOLHDL 2.9 06/20/2017 09:59 AM   LDLCALC 58 06/20/2017 09:59 AM    Wt Readings from Last 3 Encounters:  11/08/18 185 lb (83.9 kg)  01/06/18 173 lb 8 oz (78.7 kg)  10/30/17 174 lb (78.9 kg)     Objective:    Vital Signs:  BP (!) 167/78 (BP Location: Left Arm, Patient Position: Sitting, Cuff Size: Normal)   Pulse (!) 58   Ht 5\' 10"  (1.778 m)   Wt 185 lb (83.9 kg)   BMI 26.54 kg/m      ASSESSMENT & PLAN:    1.  Coronary artery disease involving native coronary arteries without angina: Overall he is doing very well with no anginal symptoms.  Continue medical therapy.  2.  Hyperlipidemia: Continue atorvastatin.  Most recent lipid profile showed an LDL of 58.  3.  Essential hypertension: Blood pressure is elevated in spite of carvedilol and enalapril.  His blood pressure was more controlled on chlorthalidone but he stopped taking the medication on his own.  He does have underlying chronic kidney disease.  Amlodipine was not tolerated due to leg edema.  Continue to monitor for now but if blood pressure remains elevated upon follow-up, I recommend resuming a small dose thiazide diuretic.  4.  Aortic insufficiency: This was stable mild to moderate on most recent echocardiogram.  COVID-19 Education: The signs and symptoms of COVID-19 were discussed with the patient and how to seek care for testing (follow up with PCP or arrange E-visit).  The  importance of social distancing was discussed today.  Patient Risk:   After full review of this patient's clinical status, I feel that they are at least moderate risk at this time.  Time:   Today, I have spent 18 minutes with the patient with telehealth technology discussing above issues.     Medication Adjustments/Labs and Tests Ordered: Current medicines are reviewed at length with the patient today.  Concerns regarding medicines are outlined above.  Tests Ordered: No orders of the defined types were placed in this encounter.  Medication Changes: No orders of the defined types were placed in this encounter.   Disposition:  Follow up in 6 month(s)  Signed, [name], MD  11/08/2018 9:39 AM  [facility]

## 2018-11-08 NOTE — Patient Instructions (Signed)
Medication Instructions:  Continue same medications  If you need a refill on your cardiac medications before your next appointment, please call your pharmacy.   Lab work: None If you have labs (blood work) drawn today and your tests are completely normal, you will receive your results only by: . MyChart Message (if you have MyChart) OR . A paper copy in the mail If you have any lab test that is abnormal or we need to change your treatment, we will call you to review the results.  Testing/Procedures: None  Follow-Up: At [facility], you and your health needs are our priority.  As part of our continuing mission to provide you with exceptional heart care, we have created designated Provider Care Teams.  These Care Teams include your primary Cardiologist (physician) and Advanced Practice Providers (APPs -  Physician Assistants and Nurse Practitioners) who all work together to provide you with the care you need, when you need it. You will need a follow up appointment in 6 months.  Please call our office 2 months in advance to schedule this appointment.  You may see No primary care provider on file. or one of the following Advanced Practice Providers on your designated Care Team:   Christopher Berge, NP Ryan Dunn, PA-C . Jacquelyn Visser, PA-C  Any Other Special Instructions Will Be Listed Below (If Applicable).    

## 2019-02-14 DIAGNOSIS — L309 Dermatitis, unspecified: Secondary | ICD-10-CM | POA: Diagnosis not present

## 2019-02-14 DIAGNOSIS — L570 Actinic keratosis: Secondary | ICD-10-CM | POA: Diagnosis not present

## 2019-02-14 DIAGNOSIS — C44212 Basal cell carcinoma of skin of right ear and external auricular canal: Secondary | ICD-10-CM | POA: Diagnosis not present

## 2019-03-14 DIAGNOSIS — Z85828 Personal history of other malignant neoplasm of skin: Secondary | ICD-10-CM | POA: Diagnosis not present

## 2019-03-14 DIAGNOSIS — L821 Other seborrheic keratosis: Secondary | ICD-10-CM | POA: Diagnosis not present

## 2019-03-14 DIAGNOSIS — L570 Actinic keratosis: Secondary | ICD-10-CM | POA: Diagnosis not present

## 2019-03-14 DIAGNOSIS — L814 Other melanin hyperpigmentation: Secondary | ICD-10-CM | POA: Diagnosis not present

## 2019-05-07 ENCOUNTER — Other Ambulatory Visit: Payer: Self-pay | Admitting: Family Medicine

## 2019-05-07 DIAGNOSIS — K219 Gastro-esophageal reflux disease without esophagitis: Secondary | ICD-10-CM

## 2019-05-13 ENCOUNTER — Other Ambulatory Visit: Payer: Self-pay | Admitting: Cardiovascular Disease

## 2019-05-13 DIAGNOSIS — E7800 Pure hypercholesterolemia, unspecified: Secondary | ICD-10-CM

## 2019-05-14 NOTE — Telephone Encounter (Signed)
Please schedule 6 mo F/U with Dr. Fletcher Anon. Thank you!

## 2019-05-29 NOTE — Telephone Encounter (Signed)
Scheduled

## 2019-05-31 ENCOUNTER — Other Ambulatory Visit: Payer: Self-pay

## 2019-05-31 ENCOUNTER — Ambulatory Visit (INDEPENDENT_AMBULATORY_CARE_PROVIDER_SITE_OTHER): Payer: Medicare Other | Admitting: Cardiovascular Disease

## 2019-05-31 ENCOUNTER — Encounter: Payer: Self-pay | Admitting: Cardiovascular Disease

## 2019-05-31 VITALS — BP 200/102 | HR 64 | Temp 97.0°F | Ht 70.0 in | Wt 173.8 lb

## 2019-05-31 DIAGNOSIS — E782 Mixed hyperlipidemia: Secondary | ICD-10-CM

## 2019-05-31 DIAGNOSIS — I10 Essential (primary) hypertension: Secondary | ICD-10-CM

## 2019-05-31 DIAGNOSIS — I359 Nonrheumatic aortic valve disorder, unspecified: Secondary | ICD-10-CM | POA: Diagnosis not present

## 2019-05-31 DIAGNOSIS — I2510 Atherosclerotic heart disease of native coronary artery without angina pectoris: Secondary | ICD-10-CM | POA: Diagnosis not present

## 2019-05-31 MED ORDER — CHLORTHALIDONE 25 MG PO TABS: 25.0000 mg | ORAL_TABLET | Freq: Every day | ORAL | 3 refills | Status: DC

## 2019-05-31 NOTE — Progress Notes (Signed)
Cardiology Office Note   Date:  05/31/2019   ID:  James Mora, DOB October 15, 1935, MRN [id number]  PCP:  [name], PA  Cardiologist:   [name], MD   Chief Complaint  Patient presents with  . other    6 month follow up. Meds reviewed by the pt. verbally. "doing well."       History of Present Illness: James Mora is a 83 y.o. male who presents for a follow-up visit regarding Coronary artery disease. He has known history of CAD as detailed below, aortic insufficiency, hypertensive heart disease, CKD stage II, and hyperlipidemia .  Cardiac catheterization in 2000 showed severe distal LAD disease with left to left collaterals that was managed medically at that time.  He was hospitalized in August 2017 with non-ST elevation myocardial infarction with peak troponin of 3.55.   LHC showed mild proximal LAD disease with 30% stenosis and possible occlusion of a small PL 3.  EF was normal.  Medical management was recommended.  TTE showed normal LV systolic function with stable mild to moderate aortic insufficiency.   He had significant leg edema that improved after stopping amlodipine.    He has been doing well with no recent chest pain or shortness of breath.  He is extremely hypertensive today.  He tolerated chlorthalidone in the past but discontinued the medication after his leg edema resolved.  Past Medical History:  Diagnosis Date  . Acid reflux   . Aortic insufficiency    a. 03/2016 Echo: EF 55-60%, no rwma, Gr1 DD, mild to mod AI, mild TR.  Marland Kitchen CAD (coronary artery disease)    a. 2000 Cath: Severe distal LAD disease with left to left collaterals;  b. 2008 MV: non-ischemic; c. 03/2016 Cath: LM nl, LAD 30p, D1/D2 mod, nl, LCX nl, OM1/2 nl w/ L-->L collats to apical LAD, RCA dominant, nl. EF 55-65%.  . Hypertensive heart disease 2005   unspecified  . Mixed hyperlipidemia    mixed    Past Surgical History:  Procedure Laterality Date  . CARDIAC CATHETERIZATION  2000    left heart  . CARDIAC CATHETERIZATION N/A 03/22/2016   Procedure: Left Heart Cath And Coronary Angiography;  Surgeon: Minna Merritts, MD;  Location: [facility];  Service: Cardiovascular;  Laterality: N/A;  . COLONOSCOPY  2008   Dr. Allen Norris     Current Outpatient Medications  Medication Sig Dispense Refill  . aspirin 81 MG tablet Take 81 mg by mouth daily.    Marland Kitchen atorvastatin (LIPITOR) 20 MG tablet TAKE 1 TABLET(20 MG) BY MOUTH DAILY 30 tablet 0  . carvedilol (COREG) 12.5 MG tablet Take 1 tablet (12.5 mg total) by mouth 2 (two) times daily. 180 tablet 3  . enalapril (VASOTEC) 20 MG tablet Take 1 tablet (20 mg total) by mouth daily. 90 tablet 3  . nitroGLYCERIN (NITROSTAT) 0.4 MG SL tablet Place 0.4 mg under the tongue every 5 (five) minutes as needed.      Marland Kitchen omeprazole (PRILOSEC) 20 MG capsule TAKE 2 CAPSULES BY MOUTH EVERY DAY 180 capsule 4   No current facility-administered medications for this visit.     Allergies:   Patient has no known allergies.    Social History:  The patient  reports that he has never smoked. He has never used smokeless tobacco. He reports that he does not drink alcohol or use drugs.   Family History:  The patient's Family history is unknown by patient.  PHYSICAL EXAM: VS:  BP (!) 200/102 (BP Location: Left Arm, Patient Position: Sitting, Cuff Size: Normal)   Pulse 64   Temp (!) 97 F (36.1 C)   Ht 5\' 10"  (1.778 m)   Wt 173 lb 12 oz (78.8 kg)   BMI 24.93 kg/m  , BMI Body mass index is 24.93 kg/m. GEN: Well nourished, well developed, in no acute distress  HEENT: normal  Neck: no JVD, carotid bruits, or masses Cardiac: RRR; no murmurs, rubs, or gallops, trace bilateral leg edema Respiratory:  clear to auscultation bilaterally, normal work of breathing GI: soft, nontender, nondistended, + BS MS: no deformity or atrophy  Skin: warm and dry, Chronic stasis dermatitis Neuro:  Strength and sensation are intact Psych: euthymic mood, full  affect    EKG:  EKG is ordered today. The ekg ordered today demonstrates Sinus bradycardia with right bundle branch block.  Recent Labs: No results found for requested labs within last 8760 hours.    Lipid Panel    Component Value Date/Time   CHOL 124 06/20/2017 0959   CHOL 119 12/16/2016 0955   TRIG 148 06/20/2017 0959   HDL 43 06/20/2017 0959   HDL 43 12/16/2016 0955   CHOLHDL 2.9 06/20/2017 0959   VLDL 14 12/20/2008 2241   LDLCALC 58 06/20/2017 0959      Wt Readings from Last 3 Encounters:  05/31/19 173 lb 12 oz (78.8 kg)  11/08/18 185 lb (83.9 kg)  01/06/18 173 lb 8 oz (78.7 kg)         ASSESSMENT AND PLAN:  1.  Coronary artery disease involving native coronary arteries without angina: Overall he is doing very well with no anginal symptoms.  Continue medical therapy.  2.  Hyperlipidemia: Continue atorvastatin.  Most recent lipid profile showed an LDL of 58.  I requested a follow-up lipid and liver profile  3.  Essential hypertension: Blood pressure is elevated in spite of carvedilol and enalapril.  I am going to resume chlorthalidone 25 mg once daily.  He does have mild chronic kidney disease.  We have to monitor renal function closely.  I requested basic metabolic profile to be done in 1 week.  4.  Aortic insufficiency: This was stable mild to moderate on most recent echocardiogram in 2019.   Disposition:   FU with me in 6 months.  Signed,  [name], MD  05/31/2019 9:41 AM    [place]

## 2019-05-31 NOTE — Patient Instructions (Addendum)
Medication Instructions:  Your physician has recommended you make the following change in your medication:   START Chlorthalidone 25mg  daily. An Rx has been sent to your pharmacy.  *If you need a refill on your cardiac medications before your next appointment, please call your pharmacy*  Lab Work: Your physician recommends that you return for a FASTING lipid profile, bmet, cbc, hepatic panel in 1 week.  Please have your lab drawn at the [facility] medical mall. You do not need an appointment. Their hours are Mon-Fri 8am-6pm.  If you have labs (blood work) drawn today and your tests are completely normal, you will receive your results only by: Marland Kitchen MyChart Message (if you have MyChart) OR . A paper copy in the mail If you have any lab test that is abnormal or we need to change your treatment, we will call you to review the results.  Testing/Procedures: None ordered  Follow-Up: At [facility], you and your health needs are our priority.  As part of our continuing mission to provide you with exceptional heart care, we have created designated Provider Care Teams.  These Care Teams include your primary Cardiologist (physician) and Advanced Practice Providers (APPs -  Physician Assistants and Nurse Practitioners) who all work together to provide you with the care you need, when you need it.  Your next appointment:   6 months  The format for your next appointment:   In Person  Provider:    You may see  Dr. Fletcher Anon  or one of the following Advanced Practice Providers on your designated Care Team:    Murray Hodgkins, NP  Christell Faith, PA-C  Marrianne Mood, PA-C   Other Instructions N/A

## 2019-06-08 ENCOUNTER — Other Ambulatory Visit
Admission: RE | Admit: 2019-06-08 | Discharge: 2019-06-08 | Disposition: A | Discharge status: Home / Self Care | Payer: Medicare Other | Source: Ambulatory Visit | Attending: Cardiovascular Disease | Admitting: Cardiovascular Disease

## 2019-06-08 ENCOUNTER — Other Ambulatory Visit: Payer: Self-pay

## 2019-06-08 DIAGNOSIS — I2510 Atherosclerotic heart disease of native coronary artery without angina pectoris: Secondary | ICD-10-CM | POA: Diagnosis not present

## 2019-06-08 DIAGNOSIS — E782 Mixed hyperlipidemia: Secondary | ICD-10-CM | POA: Diagnosis not present

## 2019-06-08 DIAGNOSIS — I10 Essential (primary) hypertension: Secondary | ICD-10-CM | POA: Diagnosis not present

## 2019-06-08 LAB — CBC WITH DIFFERENTIAL/PLATELET
Abs Immature Granulocytes: 0.03 10*3/uL (ref 0.00–0.07)
Basophils Absolute: 0 10*3/uL (ref 0.0–0.1)
Basophils Relative: 1 %
Eosinophils Absolute: 0.1 10*3/uL (ref 0.0–0.5)
Eosinophils Relative: 1 %
HCT: 43.5 % (ref 39.0–52.0)
Hemoglobin: 14.3 g/dL (ref 13.0–17.0)
Immature Granulocytes: 1 %
Lymphocytes Relative: 26 %
Lymphs Abs: 1.6 10*3/uL (ref 0.7–4.0)
MCH: 29.7 pg (ref 26.0–34.0)
MCHC: 32.9 g/dL (ref 30.0–36.0)
MCV: 90.2 fL (ref 80.0–100.0)
Monocytes Absolute: 0.5 10*3/uL (ref 0.1–1.0)
Monocytes Relative: 8 %
Neutro Abs: 3.9 10*3/uL (ref 1.7–7.7)
Neutrophils Relative %: 63 %
Platelets: 185 10*3/uL (ref 150–400)
RBC: 4.82 MIL/uL (ref 4.22–5.81)
RDW: 13.3 % (ref 11.5–15.5)
WBC: 6.1 10*3/uL (ref 4.0–10.5)
nRBC: 0 % (ref 0.0–0.2)

## 2019-06-08 LAB — LIPID PANEL
Cholesterol: 126 mg/dL (ref 0–200)
HDL: 49 mg/dL (ref >40)
LDL Cholesterol: 63 mg/dL (ref 0–99)
Total CHOL/HDL Ratio: 2.6 RATIO
Triglycerides: 68 mg/dL (ref <150)
VLDL: 14 mg/dL (ref 0–40)

## 2019-06-08 LAB — HEPATIC FUNCTION PANEL
ALT: 15 U/L (ref 0–44)
AST: 16 U/L (ref 15–41)
Albumin: 4.2 g/dL (ref 3.5–5.0)
Alkaline Phosphatase: 74 U/L (ref 38–126)
Bilirubin, Direct: 0.1 mg/dL (ref 0.0–0.2)
Indirect Bilirubin: 0.8 mg/dL (ref 0.3–0.9)
Total Bilirubin: 0.9 mg/dL (ref 0.3–1.2)
Total Protein: 7.3 g/dL (ref 6.5–8.1)

## 2019-06-08 LAB — BASIC METABOLIC PANEL
Anion gap: 10 (ref 5–15)
BUN: 31 mg/dL — ABNORMAL HIGH (ref 8–23)
CO2: 23 mmol/L (ref 22–32)
Calcium: 9.2 mg/dL (ref 8.9–10.3)
Chloride: 108 mmol/L (ref 98–111)
Creatinine, Ser: 1.59 mg/dL — ABNORMAL HIGH (ref 0.61–1.24)
GFR calc Af Amer: 46 mL/min — ABNORMAL LOW (ref >60)
GFR calc non Af Amer: 40 mL/min — ABNORMAL LOW (ref >60)
Glucose, Bld: 126 mg/dL — ABNORMAL HIGH (ref 70–99)
Potassium: 4.1 mmol/L (ref 3.5–5.1)
Sodium: 141 mmol/L (ref 135–145)

## 2019-06-22 ENCOUNTER — Telehealth: Payer: Self-pay | Admitting: Cardiovascular Disease

## 2019-06-22 NOTE — Telephone Encounter (Signed)
Spoke with the patient. Advised the patient that a copy of the lab results have been mailed to him.  Reviewed the patient's recent lab results and Dr. Tyrell Antonio recommendation. Patient verbalized understanding and voiced appreciation for the call back.  Patient sts that his has been monitoring his BP daily since the addition of chlorthalidone. Patient sts that his systolic has been running in the 130's. His diastolic has ben [id number] or below. Advised the patient that I will fwd the update to Dr. Fletcher Anon.

## 2019-06-22 NOTE — Telephone Encounter (Signed)
Patient calling to discuss recent labs testing results   Please call

## 2019-06-27 ENCOUNTER — Other Ambulatory Visit: Payer: Self-pay

## 2019-06-27 DIAGNOSIS — E7800 Pure hypercholesterolemia, unspecified: Secondary | ICD-10-CM

## 2019-06-27 MED ORDER — ATORVASTATIN CALCIUM 20 MG PO TABS: 20.0000 mg | ORAL_TABLET | Freq: Every day | ORAL | 0 refills | Status: DC

## 2019-07-27 ENCOUNTER — Telehealth: Payer: Self-pay | Admitting: Cardiovascular Disease

## 2019-07-27 NOTE — Telephone Encounter (Signed)
Spoke with patient and reviewed provider recommendations to get vaccine. He was appreciative for the call with no further questions at this time.

## 2019-07-27 NOTE — Telephone Encounter (Signed)
Patient would like to know Dr. Tyrell Antonio advice on him getting the [place] vaccination.

## 2019-07-27 NOTE — Telephone Encounter (Signed)
I recommend the vaccine once it is available to him.  I am getting it myself next week.

## 2019-09-10 ENCOUNTER — Other Ambulatory Visit: Payer: Self-pay

## 2019-09-10 DIAGNOSIS — E7800 Pure hypercholesterolemia, unspecified: Secondary | ICD-10-CM

## 2019-09-10 MED ORDER — ATORVASTATIN CALCIUM 20 MG PO TABS: 20.0000 mg | ORAL_TABLET | Freq: Every day | ORAL | 5 refills | Status: DC

## 2019-11-06 ENCOUNTER — Other Ambulatory Visit: Payer: Self-pay | Admitting: Cardiovascular Disease

## 2019-11-29 ENCOUNTER — Other Ambulatory Visit: Payer: Self-pay

## 2019-11-29 ENCOUNTER — Encounter: Payer: Self-pay | Admitting: Cardiovascular Disease

## 2019-11-29 ENCOUNTER — Ambulatory Visit (INDEPENDENT_AMBULATORY_CARE_PROVIDER_SITE_OTHER): Payer: Medicare Other | Admitting: Cardiovascular Disease

## 2019-11-29 VITALS — BP 128/80 | HR 65 | Ht 70.0 in | Wt 175.2 lb

## 2019-11-29 DIAGNOSIS — E7800 Pure hypercholesterolemia, unspecified: Secondary | ICD-10-CM

## 2019-11-29 DIAGNOSIS — I2510 Atherosclerotic heart disease of native coronary artery without angina pectoris: Secondary | ICD-10-CM | POA: Diagnosis not present

## 2019-11-29 DIAGNOSIS — I359 Nonrheumatic aortic valve disorder, unspecified: Secondary | ICD-10-CM

## 2019-11-29 DIAGNOSIS — I10 Essential (primary) hypertension: Secondary | ICD-10-CM

## 2019-11-29 NOTE — Patient Instructions (Signed)
Medication Instructions:  Your physician recommends that you continue on your current medications as directed. Please refer to the Current Medication list given to you today.  *If you need a refill on your cardiac medications before your next appointment, please call your pharmacy*   Lab Work: None ordered If you have labs (blood work) drawn today and your tests are completely normal, you will receive your results only by: . MyChart Message (if you have MyChart) OR . A paper copy in the mail If you have any lab test that is abnormal or we need to change your treatment, we will call you to review the results.   Testing/Procedures: None ordered   Follow-Up: At [facility], you and your health needs are our priority.  As part of our continuing mission to provide you with exceptional heart care, we have created designated Provider Care Teams.  These Care Teams include your primary Cardiologist (physician) and Advanced Practice Providers (APPs -  Physician Assistants and Nurse Practitioners) who all work together to provide you with the care you need, when you need it.  We recommend signing up for the patient portal called "MyChart".  Sign up information is provided on this After Visit Summary.  MyChart is used to connect with patients for Virtual Visits (Telemedicine).  Patients are able to view lab/test results, encounter notes, upcoming appointments, etc.  Non-urgent messages can be sent to your provider as well.   To learn more about what you can do with MyChart, go to [email or web address].    Your next appointment:   12 month(s)  The format for your next appointment:   In Person  Provider:    You may see Dr. Arida or one of the following Advanced Practice Providers on your designated Care Team:    Christopher Berge, NP  Ryan Dunn, PA-C  Jacquelyn Visser, PA-C    Other Instructions N/A  

## 2019-11-29 NOTE — Progress Notes (Signed)
Cardiology Office Note   Date:  11/29/2019   ID:  James Mora, DOB 13-Aug-1935, MRN [id number]  PCP:  [name], PA  Cardiologist:   [name], MD   Chief Complaint  Patient presents with  . other    6 month f/u no complaints today. Meds reviewed verbally with pt.      History of Present Illness: James Mora is a 84 y.o. male who presents for a follow-up visit regarding Coronary artery disease. He has known history of CAD as detailed below, aortic insufficiency, hypertensive heart disease, CKD stage II, and hyperlipidemia .  Cardiac catheterization in 2000 showed severe distal LAD disease with left to left collaterals that was managed medically at that time.  He was hospitalized in August 2017 with non-ST elevation myocardial infarction.   LHC showed mild proximal LAD disease with 30% stenosis and possible occlusion of a small PL 3.  EF was normal.  Medical management was recommended.  TTE showed normal LV systolic function with stable mild to moderate aortic insufficiency.   He had significant leg edema that improved after stopping amlodipine.    During last visit, he was noted to be significantly hypertensive.  I added chlorthalidone 25 mg once daily.  Subsequent labs showed stable renal function with creatinine around 1.5.  He has been doing well with no recent chest pain, shortness of breath or palpitations.  Past Medical History:  Diagnosis Date  . Acid reflux   . Aortic insufficiency    a. 03/2016 Echo: EF 55-60%, no rwma, Gr1 DD, mild to mod AI, mild TR.  Marland Kitchen CAD (coronary artery disease)    a. 2000 Cath: Severe distal LAD disease with left to left collaterals;  b. 2008 MV: non-ischemic; c. 03/2016 Cath: LM nl, LAD 30p, D1/D2 mod, nl, LCX nl, OM1/2 nl w/ L-->L collats to apical LAD, RCA dominant, nl. EF 55-65%.  . Hypertensive heart disease 2005   unspecified  . Mixed hyperlipidemia    mixed    Past Surgical History:  Procedure Laterality Date  .  CARDIAC CATHETERIZATION  2000   left heart  . CARDIAC CATHETERIZATION N/A 03/22/2016   Procedure: Left Heart Cath And Coronary Angiography;  Surgeon: Minna Merritts, MD;  Location: [facility];  Service: Cardiovascular;  Laterality: N/A;  . COLONOSCOPY  2008   Dr. Allen Norris     Current Outpatient Medications  Medication Sig Dispense Refill  . aspirin 81 MG tablet Take 81 mg by mouth daily.    Marland Kitchen atorvastatin (LIPITOR) 20 MG tablet Take 1 tablet (20 mg total) by mouth daily at 6 PM. 30 tablet 5  . carvedilol (COREG) 12.5 MG tablet TAKE 1 TABLET(12.5 MG) BY MOUTH TWICE DAILY 180 tablet 0  . chlorthalidone (HYGROTON) 25 MG tablet Take 1 tablet (25 mg total) by mouth daily. 90 tablet 3  . enalapril (VASOTEC) 20 MG tablet TAKE 1 TABLET(20 MG) BY MOUTH DAILY 90 tablet 0  . nitroGLYCERIN (NITROSTAT) 0.4 MG SL tablet Place 0.4 mg under the tongue every 5 (five) minutes as needed.      Marland Kitchen omeprazole (PRILOSEC) 20 MG capsule TAKE 2 CAPSULES BY MOUTH EVERY DAY 180 capsule 4   No current facility-administered medications for this visit.    Allergies:   Patient has no known allergies.    Social History:  The patient  reports that he has never smoked. He has never used smokeless tobacco. He reports that he does not drink alcohol  or use drugs.   Family History:  The patient's Family history is unknown by patient.      PHYSICAL EXAM: VS:  BP 128/80 (BP Location: Left Arm, Patient Position: Sitting, Cuff Size: Normal)   Pulse 65   Ht 5\' 10"  (1.778 m)   Wt 175 lb 4 oz (79.5 kg)   SpO2 97%   BMI 25.15 kg/m  , BMI Body mass index is 25.15 kg/m. GEN: Well nourished, well developed, in no acute distress  HEENT: normal  Neck: no JVD, carotid bruits, or masses Cardiac: RRR; no murmurs, rubs, or gallops, trace bilateral leg edema Respiratory:  clear to auscultation bilaterally, normal work of breathing GI: soft, nontender, nondistended, + BS MS: no deformity or atrophy  Skin: warm and dry,  Chronic stasis dermatitis Neuro:  Strength and sensation are intact Psych: euthymic mood, full affect    EKG:  EKG is ordered today. The ekg ordered today demonstrates normal sinus rhythm with right bundle branch block.  Recent Labs: 06/08/2019: ALT 15; BUN 31; Creatinine, Ser 1.59; Hemoglobin 14.3; Platelets 185; Potassium 4.1; Sodium 141    Lipid Panel    Component Value Date/Time   CHOL 126 06/08/2019 0818   CHOL 119 12/16/2016 0955   TRIG 68 06/08/2019 0818   HDL 49 06/08/2019 0818   HDL 43 12/16/2016 0955   CHOLHDL 2.6 06/08/2019 0818   VLDL 14 06/08/2019 0818   LDLCALC 63 06/08/2019 0818   LDLCALC 58 06/20/2017 0959      Wt Readings from Last 3 Encounters:  11/29/19 175 lb 4 oz (79.5 kg)  05/31/19 173 lb 12 oz (78.8 kg)  11/08/18 185 lb (83.9 kg)         ASSESSMENT AND PLAN:  1.  Coronary artery disease involving native coronary arteries without angina: Overall he is doing very well with no anginal symptoms.  Continue medical therapy.  2.  Hyperlipidemia: Continue atorvastatin.  I reviewed most recent lipid profile on labs done in October of last year.  His LDL was 63.  Liver enzymes were normal.  3.  Essential hypertension: Blood pressure is now well controlled after the addition of chlorthalidone.  He is also on carvedilol and enalapril.  4.  Aortic insufficiency: This was stable mild to moderate on most recent echocardiogram in 2019.  No murmurs by exam today.  I would consider repeat echocardiogram next year.   Disposition:   FU with me in 12 months.  Signed,  [name], MD  11/29/2019 7:58 AM    [facility]

## 2019-12-13 NOTE — Addendum Note (Signed)
Addended by: Britt Bottom on: 12/13/2019 08:40 AM   Modules accepted: Orders

## 2020-01-23 ENCOUNTER — Telehealth: Payer: Self-pay | Admitting: Family Medicine

## 2020-01-23 NOTE — Telephone Encounter (Signed)
I left a message asking the pt to call and schedule an appt.

## 2020-01-28 ENCOUNTER — Telehealth: Payer: Self-pay | Admitting: Family Medicine

## 2020-01-28 NOTE — Telephone Encounter (Signed)
2nd attempt to reach pt to schedule an appointment ?

## 2020-02-03 ENCOUNTER — Other Ambulatory Visit: Payer: Self-pay | Admitting: Cardiovascular Disease

## 2020-02-05 ENCOUNTER — Ambulatory Visit (INDEPENDENT_AMBULATORY_CARE_PROVIDER_SITE_OTHER): Payer: Medicare Other | Admitting: Family Medicine

## 2020-02-05 ENCOUNTER — Encounter: Payer: Self-pay | Admitting: Family Medicine

## 2020-02-05 ENCOUNTER — Other Ambulatory Visit: Payer: Self-pay

## 2020-02-05 VITALS — BP 154/80 | HR 60 | Temp 97.5°F | Resp 16 | Ht 68.0 in | Wt 176.2 lb

## 2020-02-05 DIAGNOSIS — E7800 Pure hypercholesterolemia, unspecified: Secondary | ICD-10-CM

## 2020-02-05 DIAGNOSIS — Z0000 Encounter for general adult medical examination without abnormal findings: Secondary | ICD-10-CM | POA: Diagnosis not present

## 2020-02-05 DIAGNOSIS — Z23 Encounter for immunization: Secondary | ICD-10-CM

## 2020-02-05 DIAGNOSIS — R972 Elevated prostate specific antigen [PSA]: Secondary | ICD-10-CM

## 2020-02-05 DIAGNOSIS — I2510 Atherosclerotic heart disease of native coronary artery without angina pectoris: Secondary | ICD-10-CM

## 2020-02-05 DIAGNOSIS — I10 Essential (primary) hypertension: Secondary | ICD-10-CM

## 2020-02-05 MED ORDER — ENALAPRIL MALEATE 20 MG PO TABS: ORAL_TABLET | ORAL | 2 refills | Status: DC

## 2020-02-05 MED ORDER — ENALAPRIL MALEATE 20 MG PO TABS: ORAL_TABLET | ORAL | 3 refills | Status: DC

## 2020-02-05 MED ORDER — ATORVASTATIN CALCIUM 20 MG PO TABS: 20.0000 mg | ORAL_TABLET | Freq: Every day | ORAL | 3 refills | Status: DC

## 2020-02-05 MED ORDER — CARVEDILOL 12.5 MG PO TABS: ORAL_TABLET | ORAL | 3 refills | Status: DC

## 2020-02-05 MED ORDER — CARVEDILOL 12.5 MG PO TABS: ORAL_TABLET | ORAL | 2 refills | Status: DC

## 2020-02-05 NOTE — Progress Notes (Signed)
Annual Wellness Visit     Patient: James Mora, Male    DOB: [birth year], 84 y.o.   MRN: [id number] Visit Date: 02/05/2020  I,Sulibeya S Dimas,acting as a Education administrator for [employer].,have documented all relevant documentation on the behalf of [employer],as directed by  [employer] while in the presence of [employer], [place].  Today's Provider: Vernie Murders, PA   Chief Complaint  Patient presents with  . Medicare Wellness   Subjective    James Mora is a 84 y.o. male who presents today for his Annual Wellness Visit. He reports consuming a general diet. Exercise is limited by  . He generally feels well. He reports sleeping well. He does not have additional problems to discuss today.   Patient Active Problem List   Diagnosis Date Noted  . Narrowing of intervertebral disc space 06/18/2016  . Elevated prostate specific antigen (PSA) 06/18/2016  . Benign fibroma of prostate 06/18/2016  . HLD (hyperlipidemia)   . Mixed hyperlipidemia   . CAD (coronary artery disease)   . Coronary artery disease involving native coronary artery of native heart with angina pectoris with documented spasm ([place])   . NSTEMI (non-ST elevated myocardial infarction) ([place]) 03/19/2016  . HTN (hypertension)   . High blood cholesterol level   . Acid reflux   . Hyperlipidemia 12/27/2008  . Essential hypertension 12/27/2008  . Coronary atherosclerosis 12/27/2008  . Aortic valve disorder 12/27/2008  . Esophagitis, reflux 06/06/2006  . Hypertensive heart disease 08/10/2003   Past Surgical History:  Procedure Laterality Date  . CARDIAC CATHETERIZATION  2000   left heart  . CARDIAC CATHETERIZATION N/A 03/22/2016   Procedure: Left Heart Cath And Coronary Angiography;  Surgeon: Minna Merritts, MD;  Location: [facility];  Service: Cardiovascular;  Laterality: N/A;  . COLONOSCOPY  2008   Dr. Allen Norris   Social History   Socioeconomic History  . Marital status: Married     Spouse name: Not on file  . Number of children: 1  . Years of education: Not on file  . Highest education level: Not on file  Occupational History  . Not on file  Tobacco Use  . Smoking status: Never Smoker  . Smokeless tobacco: Never Used  Vaping Use  . Vaping Use: Never used  Substance and Sexual Activity  . Alcohol use: No  . Drug use: No  . Sexual activity: Not on file  Other Topics Concern  . Not on file  Social History Narrative  . Not on file   Social Determinants of Health   Financial Resource Strain:   . Difficulty of Paying Living Expenses:   Food Insecurity:   . Worried About Charity fundraiser in the Last Year:   . Arboriculturist in the Last Year:   Transportation Needs:   . Film/video editor (Medical):   Marland Kitchen Lack of Transportation (Non-Medical):   Physical Activity:   . Days of Exercise per Week:   . Minutes of Exercise per Session:   Stress:   . Feeling of Stress :   Social Connections:   . Frequency of Communication with Friends and Family:   . Frequency of Social Gatherings with Friends and Family:   . Attends Religious Services:   . Active Member of Clubs or Organizations:   . Attends Archivist Meetings:   Marland Kitchen Marital Status:   Intimate Partner Violence:   . Fear of Current or Ex-Partner:   .  Emotionally Abused:   Marland Kitchen Physically Abused:   . Sexually Abused:    Family History  Family history unknown: Yes   No Known Allergies    Medications: Outpatient Medications Prior to Visit  Medication Sig  . aspirin 81 MG tablet Take 81 mg by mouth daily.  Marland Kitchen atorvastatin (LIPITOR) 20 MG tablet Take 1 tablet (20 mg total) by mouth daily at 6 PM.  . carvedilol (COREG) 12.5 MG tablet TAKE 1 TABLET(12.5 MG) BY MOUTH TWICE DAILY  . chlorthalidone (HYGROTON) 25 MG tablet Take 1 tablet (25 mg total) by mouth daily.  . enalapril (VASOTEC) 20 MG tablet TAKE 1 TABLET(20 MG) BY MOUTH DAILY  . nitroGLYCERIN (NITROSTAT) 0.4 MG SL tablet Place 0.4 mg  under the tongue every 5 (five) minutes as needed.    Marland Kitchen omeprazole (PRILOSEC) 20 MG capsule TAKE 2 CAPSULES BY MOUTH EVERY DAY   No facility-administered medications prior to visit.    Patient Care Team: Kaysey Berndt, Vickki Muff, PA as PCP - General (Family Medicine)  Review of Systems  Constitutional: Negative.   HENT: Negative.   Eyes: Positive for itching.  Respiratory: Negative.   Cardiovascular: Negative.   Gastrointestinal: Negative.   Endocrine: Negative.   Genitourinary: Negative.   Musculoskeletal: Positive for back pain.  Skin: Negative.   Allergic/Immunologic: Negative.   Neurological: Negative.   Hematological: Negative.   Psychiatric/Behavioral: Negative.    Last CBC Lab Results  Component Value Date   WBC 6.1 06/08/2019   HGB 14.3 06/08/2019   HCT 43.5 06/08/2019   MCV 90.2 06/08/2019   MCH 29.7 06/08/2019   RDW 13.3 06/08/2019   PLT 185 02/02/9484   Last metabolic panel Lab Results  Component Value Date   GLUCOSE 126 (H) 06/08/2019   NA 141 06/08/2019   K 4.1 06/08/2019   CL 108 06/08/2019   CO2 23 06/08/2019   BUN 31 (H) 06/08/2019   CREATININE 1.59 (H) 06/08/2019   GFRNONAA 40 (L) 06/08/2019   GFRAA 46 (L) 06/08/2019   CALCIUM 9.2 06/08/2019   PROT 7.3 06/08/2019   ALBUMIN 4.2 06/08/2019   LABGLOB 2.6 12/16/2016   AGRATIO 1.7 12/16/2016   BILITOT 0.9 06/08/2019   ALKPHOS 74 06/08/2019   AST 16 06/08/2019   ALT 15 06/08/2019   ANIONGAP 10 06/08/2019   Last lipids Lab Results  Component Value Date   CHOL 126 06/08/2019   HDL 49 06/08/2019   LDLCALC 63 06/08/2019   TRIG 68 06/08/2019   CHOLHDL 2.6 06/08/2019   Last thyroid functions Lab Results  Component Value Date   TSH 3.33 06/20/2017    Objective    Vitals: BP (!) 154/80 (BP Location: Right Arm, Patient Position: Sitting, Cuff Size: Normal)   Pulse 60   Temp (!) 97.5 F (36.4 C) (Temporal)   Resp 16   Ht 5\' 8"  (1.727 m)   Wt 176 lb 3.2 oz (79.9 kg)   SpO2 98%   BMI 26.79  kg/m  BP Readings from Last 3 Encounters:  02/05/20 (!) 154/80  11/29/19 128/80  05/31/19 (!) 200/102   Wt Readings from Last 3 Encounters:  02/05/20 176 lb 3.2 oz (79.9 kg)  11/29/19 175 lb 4 oz (79.5 kg)  05/31/19 173 lb 12 oz (78.8 kg)    Physical Exam Constitutional:      Appearance: He is well-developed.  HENT:     Head: Normocephalic and atraumatic.     Right Ear: External ear normal.     Left Ear:  External ear normal.     Nose: Nose normal.  Eyes:     General:        Right eye: No discharge.     Conjunctiva/sclera: Conjunctivae normal.     Pupils: Pupils are equal, round, and reactive to light.  Neck:     Thyroid: No thyromegaly.     Trachea: No tracheal deviation.  Cardiovascular:     Rate and Rhythm: Normal rate and regular rhythm.     Heart sounds: Normal heart sounds. No murmur heard.   Pulmonary:     Effort: Pulmonary effort is normal. No respiratory distress.     Breath sounds: Normal breath sounds. No wheezing or rales.  Chest:     Chest wall: No tenderness.  Abdominal:     General: There is no distension.     Palpations: Abdomen is soft. There is no mass.     Tenderness: There is no abdominal tenderness. There is no guarding or rebound.  Genitourinary:    Comments: History of enlarged prostate with BPH. Musculoskeletal:        General: No tenderness. Normal range of motion.     Cervical back: Normal range of motion and neck supple.     Comments: Complete loss of right ankle ROM due to fusion from crush injury in 1960. Good pulses throughout. Fair ROM otherwise.  Lymphadenopathy:     Cervical: No cervical adenopathy.  Skin:    General: Skin is warm and dry.     Findings: No erythema or rash.  Neurological:     Mental Status: He is alert and oriented to person, place, and time.     Cranial Nerves: No cranial nerve deficit.     Motor: No abnormal muscle tone.     Coordination: Coordination normal.     Deep Tendon Reflexes: Reflexes are normal and  symmetric. Reflexes normal.  Psychiatric:        Behavior: Behavior normal.        Thought Content: Thought content normal.        Judgment: Judgment normal.    Most recent functional status assessment: In your present state of health, do you have any difficulty performing the following activities: 02/05/2020  Hearing? N  Vision? N  Difficulty concentrating or making decisions? N  Walking or climbing stairs? Y  Dressing or bathing? N  Doing errands, shopping? N  Some recent data might be hidden   Most recent fall risk assessment: Fall Risk  02/05/2020  Falls in the past year? 1  Number falls in past yr: 1  Injury with Fall? 0  Risk for fall due to : Impaired balance/gait  Follow up Falls evaluation completed    Most recent depression screenings: PHQ 2/9 Scores 02/05/2020 06/20/2017  PHQ - 2 Score 1 0  PHQ- 9 Score 3 0   Most recent cognitive screening: 6CIT Screen 02/05/2020  What Year? 0 points  What month? 0 points  What time? 0 points  Count back from 20 0 points  Months in reverse 2 points  Repeat phrase 2 points  Total Score 4   Most recent Audit-C alcohol use screening Alcohol Use Disorder Test (AUDIT) 02/05/2020  1. How often do you have a drink containing alcohol? 0  2. How many drinks containing alcohol do you have on a typical day when you are drinking? 0  3. How often do you have six or more drinks on one occasion? 0  AUDIT-C Score 0  Alcohol Brief Interventions/Follow-up  AUDIT Score <7 follow-up not indicated   A score of 3 or more in women, and 4 or more in men indicates increased risk for alcohol abuse, EXCEPT if all of the points are from question 1   No results found for any visits on 02/05/20.  Assessment & Plan     Annual wellness visit done today including the all of the following: Reviewed patient's Family Medical History Reviewed and updated list of patient's medical providers Assessment of cognitive impairment was done Assessed patient's  functional ability Established a written schedule for health screening [place] Completed and Reviewed  Exercise Activities and Dietary recommendations Goals   Recommend walking 20-30 minutes 3 times a week for exercise.     Immunization History  Administered Date(s) Administered  . Influenza, High Dose Seasonal PF 06/21/2019  . PFIZER SARS-COV-2 Vaccination 09/10/2019, 10/01/2019    Health Maintenance  Topic Date Due  . TETANUS/TDAP  Never done  . PNA vac Low Risk Adult (1 of 2 - PCV13) Never done  . INFLUENZA VACCINE  03/09/2020  . COVID-19 Vaccine  Completed   Discussed health benefits of physical activity, and encouraged him to engage in regular exercise appropriate for his age and condition.    1. Medicare annual wellness visit, subsequent General health stable. Some decrease in general strength and balance. Has ordered a cane to use for support with ambulation. Long history of a limp since he sustained a crush injury in 1960. Has fallen 3 times in the past year. Last one was 4-6 weeks ago when he crawled out from under his house and was trying to stand. Ready to allow a pneumonia vaccination this year. Given anticipatory counseling.    2. Hypercholesteremia Tolerating the Atorvastatin 20 mg qd without side effects. Trying to follow a low fat diet and weight stable. Recheck labs and refill prescription. - Comprehensive metabolic panel - Lipid Panel With LDL/HDL Ratio - atorvastatin (LIPITOR) 20 MG tablet; Take 1 tablet (20 mg total) by mouth daily at 6 PM.  Dispense: 90 tablet; Refill: 3  3. Elevated prostate specific antigen (PSA) Asymptomatic without nocturia, hesitancy, decreased stream, urinary frequency or dribbling. Recheck past history of elevated PSA with BPH.. - PSA  4. Essential hypertension BP fairly well controlled. Still taking the Chlorthalidone, Carvedilol and Enalapril daily. No chest pains, palpitations or dyspnea. Check routine  follow up labs. - Comprehensive metabolic panel - Lipid Panel With LDL/HDL Ratio - CBC with Differential/Platelet - carvedilol (COREG) 12.5 MG tablet; TAKE 1 TABLET(12.5 MG) BY MOUTH TWICE DAILY  Dispense: 180 tablet; Refill: 3 - enalapril (VASOTEC) 20 MG tablet; TAKE 1 TABLET(20 MG) BY MOUTH DAILY  Dispense: 90 tablet; Refill: 3  5. Coronary artery disease involving native coronary artery of native heart without angina pectoris Stable and has not had to use any NTG. Follow up with Dr. Fletcher Anon (cardiologist) accomplished 11-29-19. Will recheck labs. - Comprehensive metabolic panel - Lipid Panel With LDL/HDL Ratio - CBC with Differential/Platelet  6. Need for vaccination against Streptococcus pneumoniae - Pneumococcal conjugate vaccine 13-valent IM   No follow-ups on file.     Andres Shad, PA, have reviewed all documentation for this visit. The documentation on 02/05/20 for the exam, diagnosis, procedures, and orders are all accurate and complete.    Vernie Murders, [place] [phone] (phone) [phone] (fax)  [place]

## 2020-02-05 NOTE — Patient Instructions (Signed)

## 2020-02-06 LAB — LIPID PANEL WITH LDL/HDL RATIO
Cholesterol, Total: 110 mg/dL (ref 100–199)
HDL: 42 mg/dL (ref >39)
LDL Chol Calc (NIH): 48 mg/dL (ref 0–99)
LDL/HDL Ratio: 1.1 ratio (ref 0.0–3.6)
Triglycerides: 110 mg/dL (ref 0–149)
VLDL Cholesterol Cal: 20 mg/dL (ref 5–40)

## 2020-02-06 LAB — COMPREHENSIVE METABOLIC PANEL
ALT: 12 IU/L (ref 0–44)
AST: 11 IU/L (ref 0–40)
Albumin/Globulin Ratio: 1.4 (ref 1.2–2.2)
Albumin: 3.9 g/dL (ref 3.6–4.6)
Alkaline Phosphatase: 93 IU/L (ref 48–121)
BUN/Creatinine Ratio: 14 (ref 10–24)
BUN: 22 mg/dL (ref 8–27)
Bilirubin Total: 0.8 mg/dL (ref 0.0–1.2)
CO2: 23 mmol/L (ref 20–29)
Calcium: 9.4 mg/dL (ref 8.6–10.2)
Chloride: 111 mmol/L — ABNORMAL HIGH (ref 96–106)
Creatinine, Ser: 1.59 mg/dL — ABNORMAL HIGH (ref 0.76–1.27)
GFR calc Af Amer: 46 mL/min/{1.73_m2} — ABNORMAL LOW (ref >59)
GFR calc non Af Amer: 40 mL/min/{1.73_m2} — ABNORMAL LOW (ref >59)
Globulin, Total: 2.8 g/dL (ref 1.5–4.5)
Glucose: 104 mg/dL — ABNORMAL HIGH (ref 65–99)
Potassium: 4.3 mmol/L (ref 3.5–5.2)
Sodium: 147 mmol/L — ABNORMAL HIGH (ref 134–144)
Total Protein: 6.7 g/dL (ref 6.0–8.5)

## 2020-02-06 LAB — CBC WITH DIFFERENTIAL/PLATELET
Basophils Absolute: 0 10*3/uL (ref 0.0–0.2)
Basos: 1 %
EOS (ABSOLUTE): 0.3 10*3/uL (ref 0.0–0.4)
Eos: 5 %
Hematocrit: 38.6 % (ref 37.5–51.0)
Hemoglobin: 12.8 g/dL — ABNORMAL LOW (ref 13.0–17.7)
Immature Grans (Abs): 0 10*3/uL (ref 0.0–0.1)
Immature Granulocytes: 0 %
Lymphocytes Absolute: 1.3 10*3/uL (ref 0.7–3.1)
Lymphs: 22 %
MCH: 31.4 pg (ref 26.6–33.0)
MCHC: 33.2 g/dL (ref 31.5–35.7)
MCV: 95 fL (ref 79–97)
Monocytes Absolute: 0.5 10*3/uL (ref 0.1–0.9)
Monocytes: 8 %
Neutrophils Absolute: 3.7 10*3/uL (ref 1.4–7.0)
Neutrophils: 64 %
Platelets: 187 10*3/uL (ref 150–450)
RBC: 4.08 x10E6/uL — ABNORMAL LOW (ref 4.14–5.80)
RDW: 13.5 % (ref 11.6–15.4)
WBC: 5.8 10*3/uL (ref 3.4–10.8)

## 2020-02-06 LAB — PSA: Prostate Specific Ag, Serum: 5.6 ng/mL — ABNORMAL HIGH (ref 0.0–4.0)

## 2020-02-07 ENCOUNTER — Telehealth: Payer: Self-pay

## 2020-02-07 NOTE — Telephone Encounter (Signed)
-----  Message from [employer], [place] sent at 02/07/2020  8:40 AM EDT ----- Hgb and RBC count dropped a little below normal range. Should stop by to pick up OC-Light kit to test stools for blood from bowels. PSA slight elevated and salt level is slightly elevated. Kidney function unchanged from 8 months ago. Reduce salt intake and get extra water in diet. Probably need a multivitamin with iron and recheck these levels in 3-4 months.

## 2020-02-07 NOTE — Telephone Encounter (Signed)
Tried calling patient. Left message to call back. OK for [facility] triage to advise of results. I left an OC light kit at the front office for patient to pick up.

## 2020-02-07 NOTE — Telephone Encounter (Signed)
Pt. returned call.  Advised of result note per [name] from 8:40 AM.  Pt. repeated instructions back to nurse.  Verb. Understanding.  Also stated he will go to the office, to pick up the OC-Light Kit to check stools for blood.  Advised pt. To note on a calendar to have repeat labs in October.  Agreed with plan.

## 2020-02-19 ENCOUNTER — Telehealth: Payer: Self-pay

## 2020-02-19 ENCOUNTER — Other Ambulatory Visit (INDEPENDENT_AMBULATORY_CARE_PROVIDER_SITE_OTHER): Payer: Medicare Other

## 2020-02-19 DIAGNOSIS — Z1211 Encounter for screening for malignant neoplasm of colon: Secondary | ICD-10-CM

## 2020-02-19 LAB — IFOBT (OCCULT BLOOD): IFOBT: NEGATIVE

## 2020-02-19 NOTE — Telephone Encounter (Signed)
-----   Message from [name], [place] sent at 02/19/2020  1:18 PM EDT ----- Stool test negative for blood from bowels. May need a multivitamin with iron daily and recheck blood counts in 3 months.

## 2020-02-19 NOTE — Telephone Encounter (Signed)
Called to advise patient of results of IFOB, LVMTCB.

## 2020-02-20 ENCOUNTER — Other Ambulatory Visit: Payer: Self-pay

## 2020-02-20 NOTE — Telephone Encounter (Signed)
Pt. Called back to receive results from OB stool sample.  Advised of result notes per Vernie Murders from 02/19/20.  Pt. Verb. Understanding.  Reported he has already started to take a MVI with iron.  Pt. Will note on calendar to go in to lab and have blood count rechecked in 3 mos.  Will send not to the office for order to be placed.

## 2020-02-20 NOTE — Telephone Encounter (Signed)
Do you need anything more than a CBC?

## 2020-05-14 ENCOUNTER — Other Ambulatory Visit: Payer: Self-pay | Admitting: Cardiovascular Disease

## 2020-07-08 ENCOUNTER — Other Ambulatory Visit: Payer: Self-pay | Admitting: Family Medicine

## 2020-07-08 DIAGNOSIS — K219 Gastro-esophageal reflux disease without esophagitis: Secondary | ICD-10-CM

## 2020-12-16 ENCOUNTER — Ambulatory Visit: Payer: Medicare Other | Admitting: Cardiovascular Disease

## 2020-12-18 ENCOUNTER — Ambulatory Visit: Payer: Medicare Other | Admitting: Cardiovascular Disease

## 2020-12-18 ENCOUNTER — Encounter: Payer: Self-pay | Admitting: Cardiovascular Disease

## 2020-12-18 ENCOUNTER — Other Ambulatory Visit: Payer: Self-pay

## 2020-12-18 VITALS — BP 120/70 | HR 57 | Ht 70.0 in | Wt 168.0 lb

## 2020-12-18 DIAGNOSIS — I2510 Atherosclerotic heart disease of native coronary artery without angina pectoris: Secondary | ICD-10-CM

## 2020-12-18 DIAGNOSIS — E785 Hyperlipidemia, unspecified: Secondary | ICD-10-CM | POA: Diagnosis not present

## 2020-12-18 DIAGNOSIS — I10 Essential (primary) hypertension: Secondary | ICD-10-CM

## 2020-12-18 DIAGNOSIS — I351 Nonrheumatic aortic (valve) insufficiency: Secondary | ICD-10-CM | POA: Diagnosis not present

## 2020-12-18 NOTE — Progress Notes (Signed)
Cardiology Office Note   Date:  12/18/2020   ID:  James Mora, DOB 1935-11-27, MRN [id number]  PCP:  [name], PA-C  Cardiologist:   [name], MD   Chief Complaint  Patient presents with  . Other    12 month f/u no complaints today. Meds reviewed verbally with pt.      History of Present Illness: James Mora is a 85 y.o. male who presents for a follow-up visit regarding Coronary artery disease. He has known history of CAD as detailed below, aortic insufficiency, hypertensive heart disease, CKD stage II, and hyperlipidemia .  Cardiac catheterization in 2000 showed severe distal LAD disease with left to left collaterals that was managed medically at that time.  He was hospitalized in August 2017 with non-ST elevation myocardial infarction.   LHC showed mild proximal LAD disease with 30% stenosis and possible occlusion of a small PL 3.  EF was normal.  Medical management was recommended.  TTE showed normal LV systolic function with stable mild to moderate aortic insufficiency.   He had significant leg edema that improved after stopping amlodipine.    Is doing very well with no recent chest pain, shortness of breath or palpitations.  His blood pressure has been reasonably controlled.  No side effects with medications.  Past Medical History:  Diagnosis Date  . Acid reflux   . Aortic insufficiency    a. 03/2016 Echo: EF 55-60%, no rwma, Gr1 DD, mild to mod AI, mild TR.  Marland Kitchen CAD (coronary artery disease)    a. 2000 Cath: Severe distal LAD disease with left to left collaterals;  b. 2008 MV: non-ischemic; c. 03/2016 Cath: LM nl, LAD 30p, D1/D2 mod, nl, LCX nl, OM1/2 nl w/ L-->L collats to apical LAD, RCA dominant, nl. EF 55-65%.  . Hypertensive heart disease 2005   unspecified  . Mixed hyperlipidemia    mixed    Past Surgical History:  Procedure Laterality Date  . CARDIAC CATHETERIZATION  2000   left heart  . CARDIAC CATHETERIZATION N/A 03/22/2016   Procedure:  Left Heart Cath And Coronary Angiography;  Surgeon: Minna Merritts, MD;  Location: [facility];  Service: Cardiovascular;  Laterality: N/A;  . COLONOSCOPY  2008   Dr. Allen Norris     Current Outpatient Medications  Medication Sig Dispense Refill  . aspirin 81 MG tablet Take 81 mg by mouth daily.    Marland Kitchen atorvastatin (LIPITOR) 20 MG tablet Take 1 tablet (20 mg total) by mouth daily at 6 PM. 90 tablet 3  . carvedilol (COREG) 12.5 MG tablet TAKE 1 TABLET(12.5 MG) BY MOUTH TWICE DAILY 180 tablet 3  . chlorthalidone (HYGROTON) 25 MG tablet TAKE 1 TABLET(25 MG) BY MOUTH DAILY 90 tablet 2  . enalapril (VASOTEC) 20 MG tablet TAKE 1 TABLET(20 MG) BY MOUTH DAILY 90 tablet 3  . nitroGLYCERIN (NITROSTAT) 0.4 MG SL tablet Place 0.4 mg under the tongue every 5 (five) minutes as needed.    Marland Kitchen omeprazole (PRILOSEC) 20 MG capsule TAKE 2 CAPSULES BY MOUTH EVERY DAY 180 capsule 1   No current facility-administered medications for this visit.    Allergies:   Patient has no known allergies.    Social History:  The patient  reports that he has never smoked. He has never used smokeless tobacco. He reports that he does not drink alcohol and does not use drugs.   Family History:  The patient's Family history is unknown by patient.  PHYSICAL EXAM: VS:  BP 120/70 (BP Location: Left Arm, Patient Position: Sitting, Cuff Size: Normal)   Pulse (!) 57   Ht 5\' 10"  (1.778 m)   Wt 168 lb (76.2 kg)   SpO2 97%   BMI 24.11 kg/m  , BMI Body mass index is 24.11 kg/m. GEN: Well nourished, well developed, in no acute distress  HEENT: normal  Neck: no JVD, carotid bruits, or masses Cardiac: RRR; no murmurs, rubs, or gallops, trace bilateral leg edema Respiratory:  clear to auscultation bilaterally, normal work of breathing GI: soft, nontender, nondistended, + BS MS: no deformity or atrophy  Skin: warm and dry, Chronic stasis dermatitis Neuro:  Strength and sensation are intact Psych: euthymic mood, full  affect    EKG:  EKG is ordered today. The ekg ordered today demonstrates sinus bradycardia with right bundle branch block.  Recent Labs: 02/05/2020: ALT 12; BUN 22; Creatinine, Ser 1.59; Hemoglobin 12.8; Platelets 187; Potassium 4.3; Sodium 147    Lipid Panel    Component Value Date/Time   CHOL 110 02/05/2020 1447   TRIG 110 02/05/2020 1447   HDL 42 02/05/2020 1447   CHOLHDL 2.6 06/08/2019 0818   VLDL 14 06/08/2019 0818   LDLCALC 48 02/05/2020 1447   LDLCALC 58 06/20/2017 0959      Wt Readings from Last 3 Encounters:  12/18/20 168 lb (76.2 kg)  02/05/20 176 lb 3.2 oz (79.9 kg)  11/29/19 175 lb 4 oz (79.5 kg)         ASSESSMENT AND PLAN:  1.  Coronary artery disease involving native coronary arteries without angina: Overall he is doing very well with no anginal symptoms.  Continue medical therapy.  2.  Hyperlipidemia: Continue atorvastatin.  I reviewed most recent lipid profile which showed an LDL of 48.  3.  Essential hypertension: Blood pressure is well controlled on current medications  4.  Aortic insufficiency: This was mild to moderate on echocardiogram in 2019 with no follow-up since then.  I requested a follow-up echocardiogram.   Disposition:   FU with me in 12 months.  Signed,  [name], MD  12/18/2020 10:41 AM    [facility]

## 2020-12-18 NOTE — Patient Instructions (Signed)
Medication Instructions:  Your physician recommends that you continue on your current medications as directed. Please refer to the Current Medication list given to you today.  *If you need a refill on your cardiac medications before your next appointment, please call your pharmacy*   Lab Work: None ordered If you have labs (blood work) drawn today and your tests are completely normal, you will receive your results only by: . MyChart Message (if you have MyChart) OR . A paper copy in the mail If you have any lab test that is abnormal or we need to change your treatment, we will call you to review the results.   Testing/Procedures: Your physician has requested that you have an echocardiogram. Echocardiography is a painless test that uses sound waves to create images of your heart. It provides your doctor with information about the size and shape of your heart and how well your heart's chambers and valves are working. This procedure takes approximately one hour. There are no restrictions for this procedure.     Follow-Up: At [facility], you and your health needs are our priority.  As part of our continuing mission to provide you with exceptional heart care, we have created designated Provider Care Teams.  These Care Teams include your primary Cardiologist (physician) and Advanced Practice Providers (APPs -  Physician Assistants and Nurse Practitioners) who all work together to provide you with the care you need, when you need it.  We recommend signing up for the patient portal called "MyChart".  Sign up information is provided on this After Visit Summary.  MyChart is used to connect with patients for Virtual Visits (Telemedicine).  Patients are able to view lab/test results, encounter notes, upcoming appointments, etc.  Non-urgent messages can be sent to your provider as well.   To learn more about what you can do with MyChart, go to [email or web address]://[email or web address].com.    Your next appointment:    Your physician wants you to follow-up in: 1 year You will receive a reminder letter in the mail two months in advance. If you don't receive a letter, please call our office to schedule the follow-up appointment.   The format for your next appointment:   In Person  Provider:   You may see Muhammad Arida, MD or one of the following Advanced Practice Providers on your designated Care Team:    Christopher Berge, NP  Ryan Dunn, PA-C  Jacquelyn Visser, PA-C  Cadence Furth, PA-C  Caitlin Walker, NP    Other Instructions N/A  

## 2020-12-29 ENCOUNTER — Ambulatory Visit: Payer: Self-pay | Admitting: *Deleted

## 2020-12-29 ENCOUNTER — Other Ambulatory Visit: Payer: Self-pay | Admitting: Family Medicine

## 2020-12-29 DIAGNOSIS — K219 Gastro-esophageal reflux disease without esophagitis: Secondary | ICD-10-CM

## 2020-12-29 NOTE — Telephone Encounter (Signed)
Has had drop of blood in his underwear every morning for one week. No difficulty/hesistancy/pain with voiding. No pink-tinge or blood noticed with voiding.  Saw cardiologist last week who advised appointment with pcp to recheck abnormal PSA. No appointments thru 01/12/21 except virtual.

## 2020-12-31 NOTE — Telephone Encounter (Signed)
Need to find a way to work him in for urinalysis and check prostate.

## 2021-01-01 ENCOUNTER — Ambulatory Visit (INDEPENDENT_AMBULATORY_CARE_PROVIDER_SITE_OTHER): Payer: Medicare Other | Admitting: Family Medicine

## 2021-01-01 ENCOUNTER — Other Ambulatory Visit: Payer: Self-pay

## 2021-01-01 ENCOUNTER — Encounter: Payer: Self-pay | Admitting: Family Medicine

## 2021-01-01 VITALS — BP 164/72 | HR 58 | Wt 168.0 lb

## 2021-01-01 DIAGNOSIS — R319 Hematuria, unspecified: Secondary | ICD-10-CM

## 2021-01-01 DIAGNOSIS — I10 Essential (primary) hypertension: Secondary | ICD-10-CM | POA: Diagnosis not present

## 2021-01-01 DIAGNOSIS — E7800 Pure hypercholesterolemia, unspecified: Secondary | ICD-10-CM | POA: Diagnosis not present

## 2021-01-01 DIAGNOSIS — N400 Enlarged prostate without lower urinary tract symptoms: Secondary | ICD-10-CM | POA: Diagnosis not present

## 2021-01-01 LAB — POCT URINALYSIS DIPSTICK
Bilirubin, UA: NEGATIVE
Blood, UA: NEGATIVE
Glucose, UA: NEGATIVE
Ketones, UA: NEGATIVE
Leukocytes, UA: NEGATIVE
Nitrite, UA: NEGATIVE
Protein, UA: NEGATIVE
Spec Grav, UA: 1.02 (ref 1.010–1.025)
Urobilinogen, UA: 0.2 E.U./dL
pH, UA: 6 (ref 5.0–8.0)

## 2021-01-01 MED ORDER — SULFAMETHOXAZOLE-TRIMETHOPRIM 800-160 MG PO TABS: 1.0000 | ORAL_TABLET | Freq: Two times a day (BID) | ORAL | 0 refills | Status: DC

## 2021-01-01 NOTE — Progress Notes (Signed)
Established patient visit   Patient: James Mora   DOB: 1936-05-31   85 y.o. Male  MRN: [id number] Visit Date: 01/01/2021  Today's healthcare provider: Vernie Murders, PA-C   Chief Complaint  Patient presents with  . Hematuria   Subjective    Hematuria This is a new problem. Episode onset: Started about two weeks ago.  The problem is unchanged. He describes the hematuria as gross hematuria. Irritative symptoms include frequency. Irritative symptoms do not include urgency. Obstructive symptoms do not include dribbling, straining or a weak stream. Pertinent negatives include no bladder pain, dysuria, flank pain or urinary retention.    Patient Active Problem List   Diagnosis Date Noted  . Narrowing of intervertebral disc space 06/18/2016  . Elevated prostate specific antigen (PSA) 06/18/2016  . Benign fibroma of prostate 06/18/2016  . HLD (hyperlipidemia)   . Mixed hyperlipidemia   . CAD (coronary artery disease)   . Coronary artery disease involving native coronary artery of native heart with angina pectoris with documented spasm ([place])   . NSTEMI (non-ST elevated myocardial infarction) ([place]) 03/19/2016  . HTN (hypertension)   . High blood cholesterol level   . Acid reflux   . Hyperlipidemia 12/27/2008  . Essential hypertension 12/27/2008  . Coronary atherosclerosis 12/27/2008  . Aortic valve disorder 12/27/2008  . Esophagitis, reflux 06/06/2006  . Hypertensive heart disease 08/10/2003   Past Medical History:  Diagnosis Date  . Acid reflux   . Aortic insufficiency    a. 03/2016 Echo: EF 55-60%, no rwma, Gr1 DD, mild to mod AI, mild TR.  Marland Kitchen CAD (coronary artery disease)    a. 2000 Cath: Severe distal LAD disease with left to left collaterals;  b. 2008 MV: non-ischemic; c. 03/2016 Cath: LM nl, LAD 30p, D1/D2 mod, nl, LCX nl, OM1/2 nl w/ L-->L collats to apical LAD, RCA dominant, nl. EF 55-65%.  . Hypertensive heart disease 2005   unspecified  . Mixed hyperlipidemia     mixed   Social History   Tobacco Use  . Smoking status: Never Smoker  . Smokeless tobacco: Never Used  Vaping Use  . Vaping Use: Never used  Substance Use Topics  . Alcohol use: No  . Drug use: No   No Known Allergies   Medications: Outpatient Medications Prior to Visit  Medication Sig  . aspirin 81 MG tablet Take 81 mg by mouth daily.  Marland Kitchen atorvastatin (LIPITOR) 20 MG tablet Take 1 tablet (20 mg total) by mouth daily at 6 PM.  . carvedilol (COREG) 12.5 MG tablet TAKE 1 TABLET(12.5 MG) BY MOUTH TWICE DAILY  . chlorthalidone (HYGROTON) 25 MG tablet TAKE 1 TABLET(25 MG) BY MOUTH DAILY  . enalapril (VASOTEC) 20 MG tablet TAKE 1 TABLET(20 MG) BY MOUTH DAILY  . nitroGLYCERIN (NITROSTAT) 0.4 MG SL tablet Place 0.4 mg under the tongue every 5 (five) minutes as needed.  Marland Kitchen omeprazole (PRILOSEC) 20 MG capsule TAKE 2 CAPSULES BY MOUTH EVERY DAY   No facility-administered medications prior to visit.    Review of Systems  Constitutional: Negative.   Genitourinary: Positive for frequency and hematuria. Negative for decreased urine volume, difficulty urinating, dysuria, enuresis, flank pain, genital sores, penile discharge, penile pain, penile swelling, scrotal swelling, testicular pain and urgency.  Musculoskeletal: Negative for back pain.       Objective    BP (!) 164/72 (BP Location: Right Arm, Patient Position: Sitting, Cuff Size: Large)   Pulse (!) 58   Wt 168 lb (76.2  kg)   SpO2 99%   BMI 24.11 kg/m   Wt Readings from Last 3 Encounters:  01/01/21 168 lb (76.2 kg)  12/18/20 168 lb (76.2 kg)  02/05/20 176 lb 3.2 oz (79.9 kg)   BP Readings from Last 3 Encounters:  01/01/21 (!) 164/72  12/18/20 120/70  02/05/20 (!) 154/80     Physical Exam Constitutional:      General: He is not in acute distress.    Appearance: He is well-developed.  HENT:     Head: Normocephalic and atraumatic.     Right Ear: Hearing normal.     Left Ear: Hearing normal.     Nose: Nose normal.   Eyes:     General: Lids are normal. No scleral icterus.       Right eye: No discharge.        Left eye: No discharge.     Conjunctiva/sclera: Conjunctivae normal.  Cardiovascular:     Rate and Rhythm: Normal rate and regular rhythm.     Heart sounds: Normal heart sounds.  Pulmonary:     Effort: Pulmonary effort is normal. No respiratory distress.     Breath sounds: Normal breath sounds.  Abdominal:     General: Bowel sounds are normal.     Palpations: Abdomen is soft.  Genitourinary:    Penis: Normal.      Testes: Normal.     Rectum: Normal.     Comments: Enlarged firm prostate. Musculoskeletal:        General: Normal range of motion.  Skin:    Findings: No lesion or rash.  Neurological:     Mental Status: He is alert and oriented to person, place, and time.  Psychiatric:        Speech: Speech normal.        Behavior: Behavior normal.        Thought Content: Thought content normal.      Results for orders placed or performed in visit on 01/01/21  POCT urinalysis dipstick  Result Value Ref Range   Color, UA yellow    Clarity, UA clear    Glucose, UA Negative Negative   Bilirubin, UA Negative    Ketones, UA Negative    Spec Grav, UA 1.020 1.010 - 1.025   Blood, UA Negative    pH, UA 6.0 5.0 - 8.0   Protein, UA Negative Negative   Urobilinogen, UA 0.2 0.2 or 1.0 E.U./dL   Nitrite, UA Negative    Leukocytes, UA Negative Negative    Assessment & Plan     1. Hematuria, unspecified type Has seen pinpoint spots of blood on a tissue he puts in his shorts once a day. Slight sting with urination occasionally. U/A negative for blood or pyuria. Check urine culture and labs. Given Septra for possible UTI versus prostatitis. May need referral to urologist pending lab reports. - POCT urinalysis dipstick - CBC with Differential/Platelet - Comprehensive metabolic panel - PSA - CULTURE, URINE COMPREHENSIVE - sulfamethoxazole-trimethoprim (BACTRIM DS) 800-160 MG tablet; Take 1  tablet by mouth 2 (two) times daily.  Dispense: 14 tablet; Refill: 0  2. Prostate enlargement Very enlarged prostate that is firm on DRE today. No pain. Last PSA 1 year ago was 5.6. Denies significant nocturia or urine flow problems. Will treat with antibiotic and recheck labs. - Comprehensive metabolic panel - PSA  3. Hypercholesteremia Tolerating Atorvastatin without side effects. Continue low fat diet and recheck labs. - CBC with Differential/Platelet - Comprehensive metabolic panel - Lipid  panel - TSH  4. Essential hypertension Fair control. Cardiologist (Dr. Fletcher Anon) got better BP reading on 12-18-20 at 120/70. - CBC with Differential/Platelet - Comprehensive metabolic panel - Lipid panel - TSH   No follow-ups on file.      I, Uilani Sanville, PA-C, have reviewed all documentation for this visit. The documentation on 01/01/21 for the exam, diagnosis, procedures, and orders are all accurate and complete.    Vernie Murders, PA-C  [employer] [phone] (phone) [phone] (fax)  [place]

## 2021-01-03 LAB — CBC WITH DIFFERENTIAL/PLATELET
Basophils Absolute: 0 10*3/uL (ref 0.0–0.2)
Basos: 1 %
EOS (ABSOLUTE): 0.1 10*3/uL (ref 0.0–0.4)
Eos: 1 %
Hematocrit: 40.4 % (ref 37.5–51.0)
Hemoglobin: 13.5 g/dL (ref 13.0–17.7)
Immature Grans (Abs): 0 10*3/uL (ref 0.0–0.1)
Immature Granulocytes: 0 %
Lymphocytes Absolute: 1.6 10*3/uL (ref 0.7–3.1)
Lymphs: 26 %
MCH: 30.7 pg (ref 26.6–33.0)
MCHC: 33.4 g/dL (ref 31.5–35.7)
MCV: 92 fL (ref 79–97)
Monocytes Absolute: 0.4 10*3/uL (ref 0.1–0.9)
Monocytes: 7 %
Neutrophils Absolute: 4 10*3/uL (ref 1.4–7.0)
Neutrophils: 65 %
Platelets: 213 10*3/uL (ref 150–450)
RBC: 4.4 x10E6/uL (ref 4.14–5.80)
RDW: 13.2 % (ref 11.6–15.4)
WBC: 6.1 10*3/uL (ref 3.4–10.8)

## 2021-01-03 LAB — COMPREHENSIVE METABOLIC PANEL
ALT: 11 IU/L (ref 0–44)
AST: 16 IU/L (ref 0–40)
Albumin/Globulin Ratio: 1.8 (ref 1.2–2.2)
Albumin: 4.3 g/dL (ref 3.6–4.6)
Alkaline Phosphatase: 85 IU/L (ref 44–121)
BUN/Creatinine Ratio: 18 (ref 10–24)
BUN: 29 mg/dL — ABNORMAL HIGH (ref 8–27)
Bilirubin Total: 0.6 mg/dL (ref 0.0–1.2)
CO2: 21 mmol/L (ref 20–29)
Calcium: 9.2 mg/dL (ref 8.6–10.2)
Chloride: 103 mmol/L (ref 96–106)
Creatinine, Ser: 1.64 mg/dL — ABNORMAL HIGH (ref 0.76–1.27)
Globulin, Total: 2.4 g/dL (ref 1.5–4.5)
Glucose: 107 mg/dL — ABNORMAL HIGH (ref 65–99)
Potassium: 4.3 mmol/L (ref 3.5–5.2)
Sodium: 141 mmol/L (ref 134–144)
Total Protein: 6.7 g/dL (ref 6.0–8.5)
eGFR: 42 mL/min/{1.73_m2} — ABNORMAL LOW (ref >59)

## 2021-01-03 LAB — LIPID PANEL
Chol/HDL Ratio: 2.8 ratio (ref 0.0–5.0)
Cholesterol, Total: 125 mg/dL (ref 100–199)
HDL: 45 mg/dL
LDL Chol Calc (NIH): 64 mg/dL (ref 0–99)
Triglycerides: 80 mg/dL (ref 0–149)
VLDL Cholesterol Cal: 16 mg/dL (ref 5–40)

## 2021-01-03 LAB — TSH: TSH: 5.17 u[IU]/mL — ABNORMAL HIGH (ref 0.450–4.500)

## 2021-01-03 LAB — PSA: Prostate Specific Ag, Serum: 6.8 ng/mL — ABNORMAL HIGH (ref 0.0–4.0)

## 2021-01-06 ENCOUNTER — Other Ambulatory Visit: Payer: Self-pay | Admitting: *Deleted

## 2021-01-06 DIAGNOSIS — N400 Enlarged prostate without lower urinary tract symptoms: Secondary | ICD-10-CM

## 2021-01-06 NOTE — Progress Notes (Signed)
Only mixed bacterial growth and waiting for final identifying report with sensitivity.

## 2021-01-07 ENCOUNTER — Telehealth: Payer: Self-pay | Admitting: *Deleted

## 2021-01-07 LAB — CULTURE, URINE COMPREHENSIVE

## 2021-01-07 NOTE — Telephone Encounter (Signed)
Patient was notified of results and agreed to referral. Referral ordered. Patient stated antibiotic upset his stomach so bad, that he only took it for the first couple of days. Patient wanted to know if antibiotic should be changed to something else. Please advise?

## 2021-01-08 NOTE — Telephone Encounter (Signed)
Pt is calling stating that the batrum made him sick. A replacement was going to be sent to the pharmacy. Nothing was sent please advice [phone]

## 2021-01-09 ENCOUNTER — Other Ambulatory Visit: Payer: Self-pay | Admitting: Family Medicine

## 2021-01-09 DIAGNOSIS — R319 Hematuria, unspecified: Secondary | ICD-10-CM

## 2021-01-09 DIAGNOSIS — N400 Enlarged prostate without lower urinary tract symptoms: Secondary | ICD-10-CM

## 2021-01-09 MED ORDER — CIPROFLOXACIN HCL 500 MG PO TABS: 500.0000 mg | ORAL_TABLET | Freq: Two times a day (BID) | ORAL | 0 refills | Status: DC

## 2021-01-09 NOTE — Telephone Encounter (Signed)
Sent antibiotic to his pharmacy.

## 2021-01-09 NOTE — Telephone Encounter (Signed)
Left detailed message advising patient that prescription has been sent to pharmacy. KW

## 2021-01-12 ENCOUNTER — Ambulatory Visit: Payer: Medicare Other | Admitting: Family Medicine

## 2021-01-14 ENCOUNTER — Other Ambulatory Visit: Payer: Self-pay

## 2021-01-14 ENCOUNTER — Encounter: Payer: Self-pay | Admitting: Urology

## 2021-01-14 ENCOUNTER — Ambulatory Visit: Payer: Medicare Other | Admitting: Urology

## 2021-01-14 VITALS — BP 127/77 | HR 90 | Ht 70.0 in | Wt 164.0 lb

## 2021-01-14 DIAGNOSIS — N400 Enlarged prostate without lower urinary tract symptoms: Secondary | ICD-10-CM | POA: Diagnosis not present

## 2021-01-14 DIAGNOSIS — R972 Elevated prostate specific antigen [PSA]: Secondary | ICD-10-CM

## 2021-01-14 DIAGNOSIS — N368 Other specified disorders of urethra: Secondary | ICD-10-CM | POA: Diagnosis not present

## 2021-01-14 LAB — MICROSCOPIC EXAMINATION: Bacteria, UA: NONE SEEN

## 2021-01-14 LAB — URINALYSIS, COMPLETE
Bilirubin, UA: NEGATIVE
Glucose, UA: NEGATIVE
Ketones, UA: NEGATIVE
Leukocytes,UA: NEGATIVE
Nitrite, UA: NEGATIVE
Protein,UA: NEGATIVE
Specific Gravity, UA: >1.03 — ABNORMAL HIGH (ref 1.005–1.030)
Urobilinogen, Ur: 0.2 mg/dL (ref 0.2–1.0)
pH, UA: 5 (ref 5.0–7.5)

## 2021-01-14 MED ORDER — SILODOSIN 8 MG PO CAPS: 8.0000 mg | ORAL_CAPSULE | Freq: Every day | ORAL | 0 refills | Status: DC

## 2021-01-14 NOTE — Progress Notes (Signed)
01/14/2021 8:52 AM   James Mora 09-18-1935 [id number]  Referring provider: [name], PA-C [address] [place],  [place] [address]  Chief Complaint  Patient presents with  . Elevated PSA    HPI: James Mora is an 85 y.o. male referred for evaluation of an elevated PSA and urethral spotting.   Was seen [facility] 01/01/2021 with intermittent urethral spotting on her underwear  No gross hematuria  Dipstick urinalysis was negative and culture grew mixed flora  Denies prior history urologic problems  No bothersome LUTS  Was started empirically on Septra DS  PSA performed 01/02/2021 was 6.8  Prior PSA 02/05/2020 was 5.6     PMH: Past Medical History:  Diagnosis Date  . Acid reflux   . Aortic insufficiency    a. 03/2016 Echo: EF 55-60%, no rwma, Gr1 DD, mild to mod AI, mild TR.  Marland Kitchen CAD (coronary artery disease)    a. 2000 Cath: Severe distal LAD disease with left to left collaterals;  b. 2008 MV: non-ischemic; c. 03/2016 Cath: LM nl, LAD 30p, D1/D2 mod, nl, LCX nl, OM1/2 nl w/ L-->L collats to apical LAD, RCA dominant, nl. EF 55-65%.  . Hypertensive heart disease 2005   unspecified  . Mixed hyperlipidemia    mixed    Surgical History: Past Surgical History:  Procedure Laterality Date  . CARDIAC CATHETERIZATION  2000   left heart  . CARDIAC CATHETERIZATION N/A 03/22/2016   Procedure: Left Heart Cath And Coronary Angiography;  Surgeon: Minna Merritts, MD;  Location: [facility];  Service: Cardiovascular;  Laterality: N/A;  . COLONOSCOPY  2008   Dr. Allen Norris    Home Medications:  Allergies as of 01/14/2021   No Known Allergies     Medication List       Accurate as of January 14, 2021  8:52 AM. If you have any questions, ask your nurse or doctor.        STOP taking these medications   ciprofloxacin 500 MG tablet Commonly known as: Cipro Stopped by: Abbie Sons, MD   sulfamethoxazole-trimethoprim 800-160 MG  tablet Commonly known as: BACTRIM DS Stopped by: Abbie Sons, MD     TAKE these medications   aspirin 81 MG tablet Take 81 mg by mouth daily.   atorvastatin 20 MG tablet Commonly known as: LIPITOR Take 1 tablet (20 mg total) by mouth daily at 6 PM.   carvedilol 12.5 MG tablet Commonly known as: COREG TAKE 1 TABLET(12.5 MG) BY MOUTH TWICE DAILY   chlorthalidone 25 MG tablet Commonly known as: HYGROTON TAKE 1 TABLET(25 MG) BY MOUTH DAILY   enalapril 20 MG tablet Commonly known as: VASOTEC TAKE 1 TABLET(20 MG) BY MOUTH DAILY   nitroGLYCERIN 0.4 MG SL tablet Commonly known as: NITROSTAT Place 0.4 mg under the tongue every 5 (five) minutes as needed.   omeprazole 20 MG capsule Commonly known as: PRILOSEC TAKE 2 CAPSULES BY MOUTH EVERY DAY   silodosin 8 MG Caps capsule Commonly known as: RAPAFLO Take 1 capsule (8 mg total) by mouth daily with breakfast. Started by: Abbie Sons, MD       Allergies: No Known Allergies  Family History: Family History  Family history unknown: Yes    Social History:  reports that he has never smoked. He has never used smokeless tobacco. He reports that he does not drink alcohol and does not use drugs.   Physical Exam: BP 127/77   Pulse 90  Ht 5\' 10"  (1.778 m)   Wt 164 lb (74.4 kg)   BMI 23.53 kg/m   Constitutional:  Alert and oriented, No acute distress. HEENT: [place] AT, moist mucus membranes.  Trachea midline, no masses. Cardiovascular: No clubbing, cyanosis, or edema. Respiratory: Normal respiratory effort, no increased work of breathing. GI: Abdomen is soft, nontender, nondistended, no abdominal masses GU: Prostate 60+ grams, smooth without nodules Skin: No rashes, bruises or suspicious lesions. Neurologic: Grossly intact, no focal deficits, moving all 4 extremities. Psychiatric: Normal mood and affect.   Assessment & Plan:    1.  Urethral spotting  Most likely secondary to BPH/prostatic  inflammation  Urinalysis today  Schedule cystoscopy and if he has microhematuria will also need upper tract imaging  2.  Elevated PSA  Benign DRE  Silodosin 8 mg daily x30 days  Repeat PSA prior to cystoscopy   Abbie Sons, [place] [address] [place], [place] [address] [phone]

## 2021-02-01 ENCOUNTER — Emergency Department: Payer: Medicare Other

## 2021-02-01 ENCOUNTER — Other Ambulatory Visit: Payer: Self-pay

## 2021-02-01 ENCOUNTER — Inpatient Hospital Stay
Admission: EM | Admit: 2021-02-01 | Discharge: 2021-02-10 | DRG: 438 | Disposition: A | Discharge status: Other Acute Inpatient Hospital | Payer: Medicare Other | Attending: Internal Medicine | Admitting: Internal Medicine

## 2021-02-01 DIAGNOSIS — I4510 Unspecified right bundle-branch block: Secondary | ICD-10-CM | POA: Diagnosis present

## 2021-02-01 DIAGNOSIS — Z7982 Long term (current) use of aspirin: Secondary | ICD-10-CM

## 2021-02-01 DIAGNOSIS — R0902 Hypoxemia: Secondary | ICD-10-CM

## 2021-02-01 DIAGNOSIS — I351 Nonrheumatic aortic (valve) insufficiency: Secondary | ICD-10-CM | POA: Diagnosis present

## 2021-02-01 DIAGNOSIS — Z79899 Other long term (current) drug therapy: Secondary | ICD-10-CM | POA: Diagnosis not present

## 2021-02-01 DIAGNOSIS — Z01810 Encounter for preprocedural cardiovascular examination: Secondary | ICD-10-CM | POA: Diagnosis not present

## 2021-02-01 DIAGNOSIS — K851 Biliary acute pancreatitis without necrosis or infection: Secondary | ICD-10-CM | POA: Diagnosis present

## 2021-02-01 DIAGNOSIS — K859 Acute pancreatitis without necrosis or infection, unspecified: Secondary | ICD-10-CM | POA: Diagnosis present

## 2021-02-01 DIAGNOSIS — I2510 Atherosclerotic heart disease of native coronary artery without angina pectoris: Secondary | ICD-10-CM | POA: Diagnosis present

## 2021-02-01 DIAGNOSIS — K529 Noninfective gastroenteritis and colitis, unspecified: Secondary | ICD-10-CM | POA: Diagnosis present

## 2021-02-01 DIAGNOSIS — R0602 Shortness of breath: Secondary | ICD-10-CM

## 2021-02-01 DIAGNOSIS — R52 Pain, unspecified: Secondary | ICD-10-CM

## 2021-02-01 DIAGNOSIS — N182 Chronic kidney disease, stage 2 (mild): Secondary | ICD-10-CM | POA: Diagnosis not present

## 2021-02-01 DIAGNOSIS — E876 Hypokalemia: Secondary | ICD-10-CM | POA: Diagnosis not present

## 2021-02-01 DIAGNOSIS — J9601 Acute respiratory failure with hypoxia: Secondary | ICD-10-CM | POA: Diagnosis not present

## 2021-02-01 DIAGNOSIS — N179 Acute kidney failure, unspecified: Secondary | ICD-10-CM | POA: Diagnosis present

## 2021-02-01 DIAGNOSIS — E782 Mixed hyperlipidemia: Secondary | ICD-10-CM | POA: Diagnosis present

## 2021-02-01 DIAGNOSIS — I1310 Hypertensive heart and chronic kidney disease without heart failure, with stage 1 through stage 4 chronic kidney disease, or unspecified chronic kidney disease: Secondary | ICD-10-CM | POA: Diagnosis present

## 2021-02-01 DIAGNOSIS — K8020 Calculus of gallbladder without cholecystitis without obstruction: Secondary | ICD-10-CM

## 2021-02-01 DIAGNOSIS — I252 Old myocardial infarction: Secondary | ICD-10-CM | POA: Diagnosis not present

## 2021-02-01 DIAGNOSIS — R319 Hematuria, unspecified: Secondary | ICD-10-CM | POA: Diagnosis not present

## 2021-02-01 DIAGNOSIS — N400 Enlarged prostate without lower urinary tract symptoms: Secondary | ICD-10-CM

## 2021-02-01 DIAGNOSIS — K219 Gastro-esophageal reflux disease without esophagitis: Secondary | ICD-10-CM | POA: Diagnosis present

## 2021-02-01 DIAGNOSIS — E785 Hyperlipidemia, unspecified: Secondary | ICD-10-CM | POA: Diagnosis not present

## 2021-02-01 DIAGNOSIS — N1831 Chronic kidney disease, stage 3a: Secondary | ICD-10-CM | POA: Diagnosis present

## 2021-02-01 DIAGNOSIS — I2583 Coronary atherosclerosis due to lipid rich plaque: Secondary | ICD-10-CM | POA: Diagnosis not present

## 2021-02-01 DIAGNOSIS — I10 Essential (primary) hypertension: Secondary | ICD-10-CM | POA: Diagnosis not present

## 2021-02-01 DIAGNOSIS — J9811 Atelectasis: Secondary | ICD-10-CM | POA: Diagnosis present

## 2021-02-01 DIAGNOSIS — K858 Other acute pancreatitis without necrosis or infection: Secondary | ICD-10-CM | POA: Diagnosis not present

## 2021-02-01 DIAGNOSIS — Z20822 Contact with and (suspected) exposure to covid-19: Secondary | ICD-10-CM | POA: Diagnosis present

## 2021-02-01 LAB — RESP PANEL BY RT-PCR (FLU A&B, COVID) ARPGX2
Influenza A by PCR: NEGATIVE
Influenza B by PCR: NEGATIVE
SARS Coronavirus 2 by RT PCR: NEGATIVE

## 2021-02-01 LAB — COMPREHENSIVE METABOLIC PANEL
ALT: 13 U/L (ref 0–44)
AST: 18 U/L (ref 15–41)
Albumin: 4.2 g/dL (ref 3.5–5.0)
Alkaline Phosphatase: 69 U/L (ref 38–126)
Anion gap: 10 (ref 5–15)
BUN: 31 mg/dL — ABNORMAL HIGH (ref 8–23)
CO2: 21 mmol/L — ABNORMAL LOW (ref 22–32)
Calcium: 9.1 mg/dL (ref 8.9–10.3)
Chloride: 108 mmol/L (ref 98–111)
Creatinine, Ser: 1.78 mg/dL — ABNORMAL HIGH (ref 0.61–1.24)
GFR, Estimated: 38 mL/min — ABNORMAL LOW (ref >60)
Glucose, Bld: 184 mg/dL — ABNORMAL HIGH (ref 70–99)
Potassium: 4.1 mmol/L (ref 3.5–5.1)
Sodium: 139 mmol/L (ref 135–145)
Total Bilirubin: 1.7 mg/dL — ABNORMAL HIGH (ref 0.3–1.2)
Total Protein: 7.4 g/dL (ref 6.5–8.1)

## 2021-02-01 LAB — CBC
HCT: 42.2 % (ref 39.0–52.0)
Hemoglobin: 14.5 g/dL (ref 13.0–17.0)
MCH: 31.5 pg (ref 26.0–34.0)
MCHC: 34.4 g/dL (ref 30.0–36.0)
MCV: 91.7 fL (ref 80.0–100.0)
Platelets: 183 10*3/uL (ref 150–400)
RBC: 4.6 MIL/uL (ref 4.22–5.81)
RDW: 13.3 % (ref 11.5–15.5)
WBC: 13.6 10*3/uL — ABNORMAL HIGH (ref 4.0–10.5)
nRBC: 0 % (ref 0.0–0.2)

## 2021-02-01 LAB — LIPID PANEL
Cholesterol: 125 mg/dL (ref 0–200)
HDL: 49 mg/dL (ref >40)
LDL Cholesterol: 62 mg/dL (ref 0–99)
Total CHOL/HDL Ratio: 2.6 RATIO
Triglycerides: 68 mg/dL (ref <150)
VLDL: 14 mg/dL (ref 0–40)

## 2021-02-01 LAB — TROPONIN I (HIGH SENSITIVITY)
Troponin I (High Sensitivity): 7 ng/L (ref <18)
Troponin I (High Sensitivity): 8 ng/L (ref <18)

## 2021-02-01 LAB — MAGNESIUM: Magnesium: 1.7 mg/dL (ref 1.7–2.4)

## 2021-02-01 LAB — LIPASE, BLOOD: Lipase: 1666 U/L — ABNORMAL HIGH (ref 11–51)

## 2021-02-01 MED ORDER — ONDANSETRON HCL 4 MG/2ML IJ SOLN
4.0000 mg | Freq: Four times a day (QID) | INTRAMUSCULAR | Status: DC | PRN
  Administered 2021-02-02 – 2021-02-03 (×2): 4 mg via INTRAVENOUS
  Filled 2021-02-01 (×2): qty 2

## 2021-02-01 MED ORDER — CARVEDILOL 6.25 MG PO TABS
12.5000 mg | ORAL_TABLET | Freq: Two times a day (BID) | ORAL | Status: DC
  Administered 2021-02-02 – 2021-02-03 (×3): 12.5 mg via ORAL
  Filled 2021-02-01 (×3): qty 2
  Filled 2021-02-01: qty 4

## 2021-02-01 MED ORDER — SODIUM CHLORIDE 0.9 % IV SOLN: INTRAVENOUS | Status: DC

## 2021-02-01 MED ORDER — ACETAMINOPHEN 500 MG PO TABS
1000.0000 mg | ORAL_TABLET | Freq: Once | ORAL | Status: AC
  Administered 2021-02-01: 1000 mg via ORAL
  Filled 2021-02-01: qty 2

## 2021-02-01 MED ORDER — ATORVASTATIN CALCIUM 20 MG PO TABS
20.0000 mg | ORAL_TABLET | Freq: Every day | ORAL | Status: DC
  Administered 2021-02-02 – 2021-02-10 (×8): 20 mg via ORAL
  Filled 2021-02-01 (×8): qty 1

## 2021-02-01 MED ORDER — PANTOPRAZOLE SODIUM 40 MG IV SOLR
40.0000 mg | Freq: Two times a day (BID) | INTRAVENOUS | Status: DC
  Administered 2021-02-01 – 2021-02-05 (×8): 40 mg via INTRAVENOUS
  Filled 2021-02-01 (×8): qty 40

## 2021-02-01 MED ORDER — KETOROLAC TROMETHAMINE 30 MG/ML IJ SOLN
15.0000 mg | Freq: Four times a day (QID) | INTRAMUSCULAR | Status: DC | PRN
  Administered 2021-02-01: 15 mg via INTRAVENOUS
  Filled 2021-02-01 (×2): qty 1

## 2021-02-01 MED ORDER — ENOXAPARIN SODIUM 40 MG/0.4ML IJ SOSY
40.0000 mg | PREFILLED_SYRINGE | INTRAMUSCULAR | Status: DC
  Administered 2021-02-01 – 2021-02-02 (×2): 40 mg via SUBCUTANEOUS
  Filled 2021-02-01 (×2): qty 0.4

## 2021-02-01 MED ORDER — ONDANSETRON HCL 4 MG PO TABS
4.0000 mg | ORAL_TABLET | Freq: Four times a day (QID) | ORAL | Status: DC | PRN
  Administered 2021-02-04 – 2021-02-05 (×2): 4 mg via ORAL
  Filled 2021-02-01 (×2): qty 1

## 2021-02-01 MED ORDER — MORPHINE SULFATE (PF) 2 MG/ML IV SOLN
2.0000 mg | INTRAVENOUS | Status: DC | PRN
  Administered 2021-02-02 – 2021-02-08 (×20): 2 mg via INTRAVENOUS
  Filled 2021-02-01 (×21): qty 1

## 2021-02-01 MED ORDER — ACETAMINOPHEN 325 MG PO TABS: 650.0000 mg | ORAL_TABLET | Freq: Four times a day (QID) | ORAL | Status: DC | PRN

## 2021-02-01 MED ORDER — TAMSULOSIN HCL 0.4 MG PO CAPS
0.4000 mg | ORAL_CAPSULE | Freq: Every day | ORAL | Status: DC
  Administered 2021-02-02 – 2021-02-10 (×8): 0.4 mg via ORAL
  Filled 2021-02-01 (×8): qty 1

## 2021-02-01 MED ORDER — MORPHINE SULFATE (PF) 4 MG/ML IV SOLN
4.0000 mg | Freq: Once | INTRAVENOUS | Status: AC
Start: 2021-02-01 — End: 2021-02-01
  Administered 2021-02-01: 4 mg via INTRAVENOUS
  Filled 2021-02-01: qty 1

## 2021-02-01 MED ORDER — FENTANYL CITRATE (PF) 100 MCG/2ML IJ SOLN
50.0000 ug | Freq: Once | INTRAMUSCULAR | Status: AC
  Administered 2021-02-01: 50 ug via INTRAVENOUS
  Filled 2021-02-01: qty 2

## 2021-02-01 MED ORDER — FENTANYL CITRATE (PF) 100 MCG/2ML IJ SOLN: 50.0000 ug | Freq: Once | INTRAMUSCULAR | Status: DC

## 2021-02-01 MED ORDER — PANTOPRAZOLE SODIUM 40 MG IV SOLR: 40.0000 mg | Freq: Two times a day (BID) | INTRAVENOUS | Status: DC

## 2021-02-01 MED ORDER — ONDANSETRON HCL 4 MG/2ML IJ SOLN
4.0000 mg | Freq: Once | INTRAMUSCULAR | Status: AC
  Administered 2021-02-01: 4 mg via INTRAVENOUS
  Filled 2021-02-01: qty 2

## 2021-02-01 MED ORDER — MAGNESIUM HYDROXIDE 400 MG/5ML PO SUSP
30.0000 mL | Freq: Every day | ORAL | Status: DC | PRN
  Filled 2021-02-01: qty 30

## 2021-02-01 MED ORDER — NITROGLYCERIN 0.4 MG SL SUBL
0.4000 mg | SUBLINGUAL_TABLET | SUBLINGUAL | Status: DC | PRN
  Administered 2021-02-04 (×3): 0.4 mg via SUBLINGUAL
  Filled 2021-02-01 (×2): qty 1

## 2021-02-01 MED ORDER — IOHEXOL 350 MG/ML SOLN
75.0000 mL | Freq: Once | INTRAVENOUS | Status: AC | PRN
  Administered 2021-02-01: 75 mL via INTRAVENOUS

## 2021-02-01 MED ORDER — TRAZODONE HCL 50 MG PO TABS
25.0000 mg | ORAL_TABLET | Freq: Every evening | ORAL | Status: DC | PRN
  Administered 2021-02-03 – 2021-02-05 (×3): 25 mg via ORAL
  Filled 2021-02-01 (×3): qty 1

## 2021-02-01 MED ORDER — PANTOPRAZOLE SODIUM 40 MG IV SOLR: 40.0000 mg | INTRAVENOUS | Status: DC

## 2021-02-01 MED ORDER — ACETAMINOPHEN 650 MG RE SUPP: 650.0000 mg | Freq: Four times a day (QID) | RECTAL | Status: DC | PRN

## 2021-02-01 NOTE — H&P (Signed)
[facility]   PATIENT NAME: James Mora    MR#:  [id number]  DATE OF BIRTH:  September 09, 1935  DATE OF ADMISSION:  02/01/2021  PRIMARY CARE PHYSICIAN: Chrismon, Vickki Muff, PA-C   Patient is coming from: Home  REQUESTING/REFERRING PHYSICIAN: Hulan Saas, MD  CHIEF COMPLAINT:   Chief Complaint  Patient presents with  . Abdominal Pain  . Emesis  . Diarrhea    HISTORY OF PRESENT ILLNESS:  James Mora is a 85 y.o. Caucasian      male with medical history significant for GERD, coronary artery disease, essential hypertension and dyslipidemia, presented to the emergency room with a concern of generalized abdominal pain started early this morning around 6:30 AM and with associated with nausea and vomiting as well as diarrhea that started first followed by abdominal pain that started in the lower abdomen then epigastric area and left lower quadrant then extended to his back and all around his upper abdomen.   After drinking Coca-Cola the patient vomited black-colored fluid with no evidence of coffee-ground emesis though or bright red bloody vomitus.  No melena or bright red blood per rectum.  No dysuria, oliguria or hematuria or flank pain.  No chest pain or palpitations or cough or wheezing or dyspnea.  ED Course: Upon presentation to the emergency room blood pressure was 148/92 with otherwise normal vital signs.  Labs revealed a BUN of 31 and creatinine 1.78 with magnesium 1.7.  Serum lipase was elevated at 1666 and total bili was 1.7 with normal AST, ALT and alk phos.  High-sensitivity troponin I was 8.  Triglycerides were 68.  CBC showed leukocytosis 13.6.  Influenza antigens and COVID-19 PCR came back negative.  EKG as reviewed by me : Showed normal sinus rhythm with rate of 90 with right bundle branch block and T wave inversion anteroseptally and inferolaterally. Imaging: Portable chest ray showed no acute cardiopulmonary disease. Abdominal and pelvic CT scan revealed the  following: 1. Marked severity enteritis involving the proximal and mid duodenum. 2. Findings consistent with adjacent acute pancreatitis cannot be excluded. Correlation with pancreatic enzymes is recommended. 3. Cholelithiasis. 4. Numerous bilateral simple renal cysts. 5. Small hiatal hernia. 6. Sigmoid diverticulosis.  The patient was given 1 g of p.o. Tylenol, 50 mcg of IV fentanyl twice, 4 mg IV morphine sulfate and 4 mg IV Zofran.  He will be admitted to a medically monitored bed for further evaluation and management. PAST MEDICAL HISTORY:   Past Medical History:  Diagnosis Date  . Acid reflux   . Aortic insufficiency    a. 03/2016 Echo: EF 55-60%, no rwma, Gr1 DD, mild to mod AI, mild TR.  Marland Kitchen CAD (coronary artery disease)    a. 2000 Cath: Severe distal LAD disease with left to left collaterals;  b. 2008 MV: non-ischemic; c. 03/2016 Cath: LM nl, LAD 30p, D1/D2 mod, nl, LCX nl, OM1/2 nl w/ L-->L collats to apical LAD, RCA dominant, nl. EF 55-65%.  . Hypertensive heart disease 2005   unspecified  . Mixed hyperlipidemia    mixed    PAST SURGICAL HISTORY:   Past Surgical History:  Procedure Laterality Date  . CARDIAC CATHETERIZATION  2000   left heart  . CARDIAC CATHETERIZATION N/A 03/22/2016   Procedure: Left Heart Cath And Coronary Angiography;  Surgeon: Minna Merritts, MD;  Location: [facility];  Service: Cardiovascular;  Laterality: N/A;  . COLONOSCOPY  2008   Dr. Allen Norris    SOCIAL HISTORY:  Social History   Tobacco Use  . Smoking status: Never  . Smokeless tobacco: Never  Substance Use Topics  . Alcohol use: No    FAMILY HISTORY:   Family History  Family history unknown: Yes    DRUG ALLERGIES:  No Known Allergies  REVIEW OF SYSTEMS:   ROS As per history of present illness. All pertinent systems were reviewed above. Constitutional, HEENT, cardiovascular, respiratory, GI, GU, musculoskeletal, neuro, psychiatric, endocrine, integumentary and  hematologic systems were reviewed and are otherwise negative/unremarkable except for positive findings mentioned above in the HPI.   MEDICATIONS AT HOME:   Prior to Admission medications   Medication Sig Start Date End Date Taking? Authorizing Provider  aspirin 81 MG tablet Take 81 mg by mouth daily.    [provider]  atorvastatin (LIPITOR) 20 MG tablet Take 1 tablet (20 mg total) by mouth daily at 6 PM. 02/05/20   Chrismon, Simona Huh E, PA-C  carvedilol (COREG) 12.5 MG tablet TAKE 1 TABLET(12.5 MG) BY MOUTH TWICE DAILY 02/05/20   Chrismon, Vickki Muff, PA-C  chlorthalidone (HYGROTON) 25 MG tablet TAKE 1 TABLET(25 MG) BY MOUTH DAILY 05/14/20   Wellington Hampshire, MD  enalapril (VASOTEC) 20 MG tablet TAKE 1 TABLET(20 MG) BY MOUTH DAILY 02/05/20   Chrismon, Vickki Muff, PA-C  nitroGLYCERIN (NITROSTAT) 0.4 MG SL tablet Place 0.4 mg under the tongue every 5 (five) minutes as needed.    [provider]  omeprazole (PRILOSEC) 20 MG capsule TAKE 2 CAPSULES BY MOUTH EVERY DAY 12/29/20   Chrismon, Vickki Muff, PA-C  silodosin (RAPAFLO) 8 MG CAPS capsule Take 1 capsule (8 mg total) by mouth daily with breakfast. 01/14/21   Stoioff, Ronda Fairly, MD  lovastatin (ALTOPREV) 40 MG 24 hr tablet Take 80 mg by mouth at bedtime.    05/31/19  [provider]  simvastatin (ZOCOR) 40 MG tablet Take 40 mg by mouth at bedtime.    10/20/11  [provider]      VITAL SIGNS:  Blood pressure (!) 154/91, pulse 82, temperature 98.3 F (36.8 C), temperature source Oral, resp. rate 18, height '5\' 10"'  (1.778 m), weight 74.8 kg, SpO2 96 %.  PHYSICAL EXAMINATION:  Physical Exam  GENERAL:  85 y.o.-year-old patient lying in the bed with no acute distress.  EYES: Pupils equal, round, reactive to light and accommodation. No scleral icterus. Extraocular muscles intact.  HEENT: Head atraumatic, normocephalic. Oropharynx and nasopharynx clear.  NECK:  Supple, no jugular venous distention. No thyroid enlargement, no  tenderness.  LUNGS: Normal breath sounds bilaterally, no wheezing, rales,rhonchi or crepitation. No use of accessory muscles of respiration.  CARDIOVASCULAR: Regular rate and rhythm, S1, S2 normal. No murmurs, rubs, or gallops.  ABDOMEN: Soft, nondistended with generalized abdominal tenderness without rebound tenderness guarding or rigidity. EXTREMITIES: No pedal edema, cyanosis, or clubbing.  NEUROLOGIC: Cranial nerves II through XII are intact. Muscle strength 5/5 in all extremities. Sensation intact. Gait not checked.  PSYCHIATRIC: The patient is alert and oriented x 3.  Normal affect and good eye contact. SKIN: No obvious rash, lesion, or ulcer.   LABORATORY PANEL:   CBC Recent Labs  Lab 02/01/21 1658  WBC 13.6*  HGB 14.5  HCT 42.2  PLT 183   ------------------------------------------------------------------------------------------------------------------  Chemistries  Recent Labs  Lab 02/01/21 1658  NA 139  K 4.1  CL 108  CO2 21*  GLUCOSE 184*  BUN 31*  CREATININE 1.78*  CALCIUM 9.1  MG 1.7  AST 18  ALT 13  ALKPHOS  69  BILITOT 1.7*   ------------------------------------------------------------------------------------------------------------------  Cardiac Enzymes No results for input(s): TROPONINI in the last 168 hours. ------------------------------------------------------------------------------------------------------------------  RADIOLOGY:  DG Chest Portable 1 View  Result Date: 02/01/2021 CLINICAL DATA:  Epigastric pain. EXAM: PORTABLE CHEST 1 VIEW COMPARISON:  March 19, 2016 FINDINGS: Stable cardiomegaly. Stable atelectasis in the left base. No pneumothorax. No other interval changes or acute abnormalities. IMPRESSION: No active disease. Electronically Signed   By: Dorise Bullion III M.D   On: 02/01/2021 18:54   CT Angio Abd/Pel W and/or Wo Contrast  Result Date: 02/01/2021 CLINICAL DATA:  Abdominal pain and emesis. EXAM: CTA ABDOMEN AND PELVIS  WITHOUT AND WITH CONTRAST TECHNIQUE: Multidetector CT imaging of the abdomen and pelvis was performed using the standard protocol during bolus administration of intravenous contrast. Multiplanar reconstructed images and MIPs were obtained and reviewed to evaluate the vascular anatomy. CONTRAST:  30m OMNIPAQUE IOHEXOL 350 MG/ML SOLN COMPARISON:  None. FINDINGS: VASCULAR Aorta: Normal caliber aorta without aneurysm, dissection, vasculitis or significant stenosis. Celiac: Patent without evidence of aneurysm, dissection, vasculitis or significant stenosis. SMA: Patent without evidence of aneurysm, dissection, vasculitis or significant stenosis. Renals: Both renal arteries are patent without evidence of aneurysm, dissection, vasculitis, fibromuscular dysplasia or significant stenosis. IMA: Patent without evidence of aneurysm, dissection, vasculitis or significant stenosis. Inflow: Patent without evidence of aneurysm, dissection, vasculitis or significant stenosis. Proximal Outflow: Bilateral common femoral and visualized portions of the superficial and profunda femoral arteries are patent without evidence of aneurysm, dissection, vasculitis or significant stenosis. Veins: No obvious venous abnormality within the limitations of this arterial phase study. Review of the MIP images confirms the above findings. NON-VASCULAR Lower chest: No acute abnormality. Hepatobiliary: There is diffuse fatty infiltration of the liver parenchyma. No focal liver abnormality is seen. A 7 mm gallstone is seen within the gallbladder lumen, without evidence of gallbladder wall thickening or biliary dilatation. Pancreas: Peripancreatic inflammatory fat stranding is seen. This also involves the adjacent duodenum and extends along the midline of the mid to lower abdomen. There is no evidence of pancreatic duct dilatation. Spleen: Normal in size without focal abnormality. Adrenals/Urinary Tract: Adrenal glands are unremarkable. Numerous bilateral  simple renal cysts of various sizes are seen. There is no evidence of renal calculi or hydronephrosis. Bladder is unremarkable. Stomach/Bowel: There is a small hiatal hernia. Appendix appears normal. No evidence of bowel dilatation. Marked severity, asymmetric thickening of the proximal and mid duodenum is seen. Associated Peri duodenal inflammatory fat stranding is noted. Noninflamed diverticula are seen throughout the sigmoid colon. Lymphatic: No abnormal abdominal or pelvic lymph nodes are identified. Reproductive: Moderate to marked severity prostate gland enlargement is seen. Other: A 3.0 cm x 2.2 cm fat containing right inguinal hernia is seen. A similar appearing 2.0 cm x 1.7 cm fat containing left inguinal hernia is noted. No abdominopelvic ascites. Musculoskeletal: Multilevel degenerative changes are seen throughout the lumbar spine. IMPRESSION: VASCULAR No evidence of aneurysm, dissection, vasculitis or significant stenosis of the arterial structures within the abdomen or pelvis. NON-VASCULAR 1. Marked severity enteritis involving the proximal and mid duodenum. 2. Findings consistent with adjacent acute pancreatitis cannot be excluded. Correlation with pancreatic enzymes is recommended. 3. Cholelithiasis. 4. Numerous bilateral simple renal cysts. 5. Small hiatal hernia. 6. Sigmoid diverticulosis. Electronically Signed   By: TVirgina NorfolkM.D.   On: 02/01/2021 19:07      IMPRESSION AND PLAN:  Active Problems:   Acute pancreatitis  1.  Acute pancreatitis in the setting of cholelithiasis without choledocholithiasis. -  The patient will be admitted to a medical monitored bed. - He will be kept n.p.o. except for medications. - We will follow serial lipase levels. - Pain management will be provided. - General surgery consult will be obtained. - I notified Dr. Peyton Najjar about the patient. - Gastroenterology consult will be obtained. - Notify Dr. Haig Prophet about the patient.  2.  Acute severe  enteritis involving the proximal and mid duodenum. - The patient will be placed on IV PPI therapy.  3.  Dyslipidemia. - We will continue statin therapy.  4.  Essential hypertension. - We will continue Coreg and enalapril.  5.  BPH. - We will continue Rapaflo/formulary alternative with Flomax.  6.  Coronary artery disease. - We will continue Coreg and enalapril as well as as needed sublingual nitroglycerin. - We will hold off aspirin at this time.  7.  GERD. - The patient be placed on IV PPI therapy.   DVT prophylaxis: Lovenox. Code Status: full code. Family Communication:  The plan of care was discussed in details with the patient (and family). I answered all questions. The patient agreed to proceed with the above mentioned plan. Further management will depend upon hospital course. Disposition Plan: Back to previous home environment Consults called: General surgery and gastroenterology.  All the records are reviewed and case discussed with ED provider.  Status is: Inpatient  Remains inpatient appropriate because:Ongoing active pain requiring inpatient pain management, Ongoing diagnostic testing needed not appropriate for outpatient work up, Unsafe d/c plan, IV treatments appropriate due to intensity of illness or inability to take PO, and Inpatient level of care appropriate due to severity of illness  Dispo: The patient is from: Home              Anticipated d/c is to: Home              Patient currently is not medically stable to d/c.   Difficult to place patient No  TOTAL TIME TAKING CARE OF THIS PATIENT: 55 minutes.    Christel Mormon M.D on 02/01/2021 at 9:29 PM  [facility]   From 7 PM-7 AM, contact night-coverage [email or web address]  CC: Primary care physician; Chrismon, Vickki Muff, PA-C

## 2021-02-01 NOTE — ED Notes (Signed)
Dr. Creig Hines at bedside at this time.

## 2021-02-01 NOTE — ED Notes (Signed)
Patient transported to CT 

## 2021-02-01 NOTE — ED Notes (Signed)
Called CT to come get pt, IV access has been obtained.

## 2021-02-01 NOTE — ED Notes (Signed)
Report called to [employer].  Report was verbalized understanding.  Patient stable for transport to the floor

## 2021-02-01 NOTE — ED Triage Notes (Signed)
Pt states general abd pain, N&V&D that began today. States pain goes to back. Cardiac hx. A&O, in wheelchair. Took immodium and pepto bismol today. Still has abd organs.

## 2021-02-01 NOTE — ED Provider Notes (Signed)
Emergency Medicine Provider Triage Evaluation Note  James Mora , a 85 y.o. male  was evaluated in triage.  Pt complains of acute onset of abdominal pain radiating to the back with diarrhea and black emesis starting this morning. He has tried immodium and pepto bismol with only worsening of his pain. Denies any fevers. Denies knowing if there was any black or known blood in the episode of diarrhea. Reports cardiac hx with Mix3 as well as HTN and HLD, no known aorta disease  Review of Systems  Positive: Diarrhea, nausea, vomiting, abdominal pain Negative: Fever, chills, chest pain, shortness of breath  Physical Exam  BP (!) 148/92 (BP Location: Left Arm)   Pulse 86   Temp 98.3 F (36.8 C) (Oral)   Resp 18   Ht 5\' 10"  (1.778 m)   Wt 74.8 kg   SpO2 96%   BMI 23.68 kg/m  Gen:   Awake, no distress  Resp:  Normal effort  MSK:   Moves extremities without difficulty  Other:  Abd: TTP in all quadrants with guarding present.   Medical Decision Making  Medically screening exam initiated at 5:20 PM.  Appropriate orders placed.  James Mora was informed that the remainder of the evaluation will be completed by another provider, this initial triage assessment does not replace that evaluation, and the importance of remaining in the ED until their evaluation is complete.    Marlana Salvage, PA 02/01/21 1723    Lucrezia Starch, MD 02/01/21 2232

## 2021-02-01 NOTE — ED Provider Notes (Addendum)
[facility] Emergency Department Provider Note  ____________________________________________   Event Date/Time   First MD Initiated Contact with Patient 02/01/21 1724     (approximate)  I have reviewed the triage vital signs and the nursing notes.   HISTORY  Chief Complaint Abdominal Pain, Emesis, and Diarrhea   HPI James Mora is a 85 y.o. male with past medical history of CAD, HTN, HDL, GERD, aortic insufficiency, and enlarged prostate who presents coming by his wife for assessment of some generalized abdominal pain that seems to have began fairly acutely early this morning around 630 associated with diarrhea and some vomiting.  Patient states he thinks he vomited something black looking although this was right after he drank some Coca-Cola thinks it was out.  He has not had any other black emesis, coffee-ground emesis, bloody emesis or any dark tarry or bloody stools.  He does not have any recent burning with urination or blood in his urine.  He denies any acute chest pain, cough, shortness of breath but feels this pain is radiating to his back.  Denies any headache, earache, sore throat, rash or extremity pain.  No prior similar process.  No clearly fitting aggravating factors.  No other acute concerns at this time.         Past Medical History:  Diagnosis Date   Acid reflux    Aortic insufficiency    a. 03/2016 Echo: EF 55-60%, no rwma, Gr1 DD, mild to mod AI, mild TR.   CAD (coronary artery disease)    a. 2000 Cath: Severe distal LAD disease with left to left collaterals;  b. 2008 MV: non-ischemic; c. 03/2016 Cath: LM nl, LAD 30p, D1/D2 mod, nl, LCX nl, OM1/2 nl w/ L-->L collats to apical LAD, RCA dominant, nl. EF 55-65%.   Hypertensive heart disease 2005   unspecified   Mixed hyperlipidemia    mixed    Patient Active Problem List   Diagnosis Date Noted   Acute pancreatitis 02/01/2021   Narrowing of intervertebral disc space 06/18/2016    Elevated prostate specific antigen (PSA) 06/18/2016   Benign fibroma of prostate 06/18/2016   HLD (hyperlipidemia)    Mixed hyperlipidemia    CAD (coronary artery disease)    Coronary artery disease involving native coronary artery of native heart with angina pectoris with documented spasm (HCC)    NSTEMI (non-ST elevated myocardial infarction) ([place]) 03/19/2016   HTN (hypertension)    High blood cholesterol level    Acid reflux    Hyperlipidemia 12/27/2008   Essential hypertension 12/27/2008   Coronary atherosclerosis 12/27/2008   Aortic valve disorder 12/27/2008   Esophagitis, reflux 06/06/2006   Hypertensive heart disease 08/10/2003    Past Surgical History:  Procedure Laterality Date   CARDIAC CATHETERIZATION  2000   left heart   CARDIAC CATHETERIZATION N/A 03/22/2016   Procedure: Left Heart Cath And Coronary Angiography;  Surgeon: Minna Merritts, MD;  Location: [facility];  Service: Cardiovascular;  Laterality: N/A;   COLONOSCOPY  2008   Dr. Allen Norris    Prior to Admission medications   Medication Sig Start Date End Date Taking? Authorizing Provider  aspirin 81 MG tablet Take 81 mg by mouth daily.    [provider]  atorvastatin (LIPITOR) 20 MG tablet Take 1 tablet (20 mg total) by mouth daily at 6 PM. 02/05/20   Chrismon, Simona Huh E, PA-C  carvedilol (COREG) 12.5 MG tablet TAKE 1 TABLET(12.5 MG) BY MOUTH TWICE DAILY 02/05/20  Chrismon, Vickki Muff, PA-C  chlorthalidone (HYGROTON) 25 MG tablet TAKE 1 TABLET(25 MG) BY MOUTH DAILY 05/14/20   Wellington Hampshire, MD  enalapril (VASOTEC) 20 MG tablet TAKE 1 TABLET(20 MG) BY MOUTH DAILY 02/05/20   Chrismon, Vickki Muff, PA-C  nitroGLYCERIN (NITROSTAT) 0.4 MG SL tablet Place 0.4 mg under the tongue every 5 (five) minutes as needed.    [provider]  omeprazole (PRILOSEC) 20 MG capsule TAKE 2 CAPSULES BY MOUTH EVERY DAY 12/29/20   Chrismon, Vickki Muff, PA-C  silodosin (RAPAFLO) 8 MG CAPS capsule Take 1 capsule (8 mg  total) by mouth daily with breakfast. 01/14/21   Stoioff, Ronda Fairly, MD  lovastatin (ALTOPREV) 40 MG 24 hr tablet Take 80 mg by mouth at bedtime.    05/31/19  [provider]  simvastatin (ZOCOR) 40 MG tablet Take 40 mg by mouth at bedtime.    10/20/11  [provider]    Allergies Patient has no known allergies.  Family History  Family history unknown: Yes    Social History Social History   Tobacco Use   Smoking status: Never   Smokeless tobacco: Never  Vaping Use   Vaping Use: Never used  Substance Use Topics   Alcohol use: No   Drug use: No    Review of Systems  Review of Systems  Constitutional:  Negative for chills and fever.  HENT:  Negative for sore throat.   Eyes:  Negative for pain.  Respiratory:  Negative for cough and stridor.   Cardiovascular:  Negative for chest pain.  Gastrointestinal:  Positive for abdominal pain, nausea and vomiting.  Genitourinary:  Negative for dysuria.  Musculoskeletal:  Negative for myalgias.  Skin:  Negative for rash.  Neurological:  Negative for seizures, loss of consciousness and headaches.  Psychiatric/Behavioral:  Negative for suicidal ideas.   All other systems reviewed and are negative.    ____________________________________________   PHYSICAL EXAM:  VITAL SIGNS: ED Triage Vitals  Enc Vitals Group     BP 02/01/21 1643 (!) 148/92     Pulse Rate 02/01/21 1643 86     Resp 02/01/21 1643 18     Temp 02/01/21 1643 98.3 F (36.8 C)     Temp Source 02/01/21 1643 Oral     SpO2 02/01/21 1643 96 %     Weight 02/01/21 1644 165 lb (74.8 kg)     Height 02/01/21 1644 5\' 10"  (1.778 m)     Head Circumference --      Peak Flow --      Pain Score 02/01/21 1644 10     Pain Loc --      Pain Edu? --      Excl. in [place]? --    Vitals:   02/01/21 1800 02/01/21 1920  BP: (!) 164/66 (!) 154/91  Pulse: 64 82  Resp: (!) 30 18  Temp:    SpO2: 100% 96%   Physical Exam Vitals and nursing note reviewed.   Constitutional:      Appearance: He is well-developed.  HENT:     Head: Normocephalic and atraumatic.  Eyes:     Conjunctiva/sclera: Conjunctivae normal.  Cardiovascular:     Rate and Rhythm: Normal rate and regular rhythm.     Heart sounds: No murmur heard. Pulmonary:     Effort: Pulmonary effort is normal. No respiratory distress.     Breath sounds: Normal breath sounds.  Abdominal:     Palpations: Abdomen is soft.     Tenderness:  There is generalized abdominal tenderness.  Musculoskeletal:     Cervical back: Neck supple.  Skin:    General: Skin is warm and dry.  Neurological:     Mental Status: He is alert. He is disoriented and confused.     ____________________________________________   LABS (all labs ordered are listed, but only abnormal results are displayed)  Labs Reviewed  LIPASE, BLOOD - Abnormal; Notable for the following components:      Result Value   Lipase 1,666 (*)    All other components within normal limits  COMPREHENSIVE METABOLIC PANEL - Abnormal; Notable for the following components:   CO2 21 (*)    Glucose, Bld 184 (*)    BUN 31 (*)    Creatinine, Ser 1.78 (*)    Total Bilirubin 1.7 (*)    GFR, Estimated 38 (*)    All other components within normal limits  CBC - Abnormal; Notable for the following components:   WBC 13.6 (*)    All other components within normal limits  RESP PANEL BY RT-PCR (FLU A&B, COVID) ARPGX2  GASTROINTESTINAL PANEL BY PCR, STOOL (REPLACES STOOL CULTURE)  C DIFFICILE QUICK SCREEN W PCR REFLEX    MAGNESIUM  LIPID PANEL  URINALYSIS, COMPLETE (UACMP) WITH MICROSCOPIC  COMPREHENSIVE METABOLIC PANEL  CBC  TROPONIN I (HIGH SENSITIVITY)  TROPONIN I (HIGH SENSITIVITY)   ____________________________________________  EKG  Sinus rhythm with a ventricular rate of 90, right bundle branch block, nonspecific ST changes in lateral leads and T wave inversion and lead III and aVF.  There is also nonspecific ST changes in anterior  leads. ____________________________________________  RADIOLOGY  ED MD interpretation: Chest x-ray without evidence of pneumonia, thorax, effusion, edema or other clear acute intrathoracic process.  CT abdomen pelvis remarkable for fairly significant enteritis with a proximal and mid duodenum with adjacent pancreatitis.  There is cholelithiasis without evidence of cholecystitis or dilation of the common bile duct.  There is also a simple renal cyst and a small hiatal hernia as well as diverticulosis without evidence of diverticulitis.  No other clear acute abdominopelvic process.  Official radiology report(s): DG Chest Portable 1 View  Result Date: 02/01/2021 CLINICAL DATA:  Epigastric pain. EXAM: PORTABLE CHEST 1 VIEW COMPARISON:  March 19, 2016 FINDINGS: Stable cardiomegaly. Stable atelectasis in the left base. No pneumothorax. No other interval changes or acute abnormalities. IMPRESSION: No active disease. Electronically Signed   By: Dorise Bullion III M.D   On: 02/01/2021 18:54   CT Angio Abd/Pel W and/or Wo Contrast  Result Date: 02/01/2021 CLINICAL DATA:  Abdominal pain and emesis. EXAM: CTA ABDOMEN AND PELVIS WITHOUT AND WITH CONTRAST TECHNIQUE: Multidetector CT imaging of the abdomen and pelvis was performed using the standard protocol during bolus administration of intravenous contrast. Multiplanar reconstructed images and MIPs were obtained and reviewed to evaluate the vascular anatomy. CONTRAST:  73mL OMNIPAQUE IOHEXOL 350 MG/ML SOLN COMPARISON:  None. FINDINGS: VASCULAR Aorta: Normal caliber aorta without aneurysm, dissection, vasculitis or significant stenosis. Celiac: Patent without evidence of aneurysm, dissection, vasculitis or significant stenosis. SMA: Patent without evidence of aneurysm, dissection, vasculitis or significant stenosis. Renals: Both renal arteries are patent without evidence of aneurysm, dissection, vasculitis, fibromuscular dysplasia or significant stenosis. IMA:  Patent without evidence of aneurysm, dissection, vasculitis or significant stenosis. Inflow: Patent without evidence of aneurysm, dissection, vasculitis or significant stenosis. Proximal Outflow: Bilateral common femoral and visualized portions of the superficial and profunda femoral arteries are patent without evidence of aneurysm, dissection, vasculitis or significant stenosis.  Veins: No obvious venous abnormality within the limitations of this arterial phase study. Review of the MIP images confirms the above findings. NON-VASCULAR Lower chest: No acute abnormality. Hepatobiliary: There is diffuse fatty infiltration of the liver parenchyma. No focal liver abnormality is seen. A 7 mm gallstone is seen within the gallbladder lumen, without evidence of gallbladder wall thickening or biliary dilatation. Pancreas: Peripancreatic inflammatory fat stranding is seen. This also involves the adjacent duodenum and extends along the midline of the mid to lower abdomen. There is no evidence of pancreatic duct dilatation. Spleen: Normal in size without focal abnormality. Adrenals/Urinary Tract: Adrenal glands are unremarkable. Numerous bilateral simple renal cysts of various sizes are seen. There is no evidence of renal calculi or hydronephrosis. Bladder is unremarkable. Stomach/Bowel: There is a small hiatal hernia. Appendix appears normal. No evidence of bowel dilatation. Marked severity, asymmetric thickening of the proximal and mid duodenum is seen. Associated Peri duodenal inflammatory fat stranding is noted. Noninflamed diverticula are seen throughout the sigmoid colon. Lymphatic: No abnormal abdominal or pelvic lymph nodes are identified. Reproductive: Moderate to marked severity prostate gland enlargement is seen. Other: A 3.0 cm x 2.2 cm fat containing right inguinal hernia is seen. A similar appearing 2.0 cm x 1.7 cm fat containing left inguinal hernia is noted. No abdominopelvic ascites. Musculoskeletal: Multilevel  degenerative changes are seen throughout the lumbar spine. IMPRESSION: VASCULAR No evidence of aneurysm, dissection, vasculitis or significant stenosis of the arterial structures within the abdomen or pelvis. NON-VASCULAR 1. Marked severity enteritis involving the proximal and mid duodenum. 2. Findings consistent with adjacent acute pancreatitis cannot be excluded. Correlation with pancreatic enzymes is recommended. 3. Cholelithiasis. 4. Numerous bilateral simple renal cysts. 5. Small hiatal hernia. 6. Sigmoid diverticulosis. Electronically Signed   By: Virgina Norfolk M.D.   On: 02/01/2021 19:07    ____________________________________________   PROCEDURES  Procedure(s) performed (including Critical Care):  .1-3 Lead EKG Interpretation  Date/Time: 02/01/2021 9:47 PM Performed by: James Starch, MD Authorized by: James Starch, MD     Interpretation: normal     ECG rate assessment: normal     Rhythm: sinus rhythm     Ectopy: none     Conduction: abnormal     ____________________________________________   INITIAL IMPRESSION / ASSESSMENT AND PLAN / ED COURSE      Patient presents with above to history exam for assessment of some fairly acute onset of abdominal pain associate with vomiting and diarrhea.  Patient states he had an episode of very black appearing emesis this was were after "[place] has not had other coffee-ground or bloody emesis.  Lower suspicion for GI bleed although initial differential is quite broad and includes possible cholecystitis, pancreatitis, kidney stone, diverticulitis, AAA, infectious enteritis, metabolic derangements and very typical presentation of ACS.  Chest x-ray without evidence of pneumonia, thorax, effusion, edema or other clear acute intrathoracic process.  CT abdomen pelvis remarkable for fairly significant enteritis with a proximal and mid duodenum with adjacent pancreatitis.  There is cholelithiasis without evidence of cholecystitis or dilation  of the common bile duct.  There is also a simple renal cyst and a small hiatal hernia as well as diverticulosis without evidence of diverticulitis.  No other clear acute abdominopelvic process.  Lipase is 1666 consistent with acute pancreatitis and correlates with findings on CT.  CMP remarkable for glucose of 184, creatinine of 1.78 compared to 1.54-month ago without any other significant electrode metabolic derangements.  WBC count is 13.6 and hemoglobin is  normal.  COVID influenza PCR is negative.  Magnesium is 1.7.  ECG has some nonspecific findings given nonelevated troponin x2 with pain primarily centered epigastrium and findings on CT and labs clearly suggestive of pancreatitis overall I have a low suspicion for ACS.  On my reassessment patient states he still has fairly significant pain after 100 mcg of morphine but his nausea has improved.  He was able tolerate some ginger ale.  In addition patient notably denies any recent EtOH use.  He does note he was recently on Bactrim prescribed by his urologist for his hematuria and Cipro although he thinks he is not currently taking these medicines at this time.  On reassessment he still has fairly significant pain.  I will admit to medicine service for pain management.      ____________________________________________   FINAL CLINICAL IMPRESSION(S) / ED DIAGNOSES  Final diagnoses:  Acute pancreatitis, unspecified complication status, unspecified pancreatitis type    Medications  atorvastatin (LIPITOR) tablet 20 mg (has no administration in time range)  nitroGLYCERIN (NITROSTAT) SL tablet 0.4 mg (has no administration in time range)  tamsulosin (FLOMAX) capsule 0.4 mg (has no administration in time range)  carvedilol (COREG) tablet 12.5 mg (has no administration in time range)  enoxaparin (LOVENOX) injection 40 mg (has no administration in time range)  0.9 %  sodium chloride infusion (has no administration in time range)   acetaminophen (TYLENOL) tablet 650 mg (has no administration in time range)    Or  acetaminophen (TYLENOL) suppository 650 mg (has no administration in time range)  traZODone (DESYREL) tablet 25 mg (has no administration in time range)  magnesium hydroxide (MILK OF MAGNESIA) suspension 30 mL (has no administration in time range)  ondansetron (ZOFRAN) tablet 4 mg (has no administration in time range)    Or  ondansetron (ZOFRAN) injection 4 mg (has no administration in time range)  morphine 2 MG/ML injection 2 mg (has no administration in time range)  ketorolac (TORADOL) 15 MG/ML injection 15 mg (has no administration in time range)  ondansetron (ZOFRAN) injection 4 mg (4 mg Intravenous Given 02/01/21 1748)  fentaNYL (SUBLIMAZE) injection 50 mcg (50 mcg Intravenous Given 02/01/21 1749)  iohexol (OMNIPAQUE) 350 MG/ML injection 75 mL (75 mLs Intravenous Contrast Given 02/01/21 1812)  fentaNYL (SUBLIMAZE) injection 50 mcg (50 mcg Intravenous Given 02/01/21 1915)  acetaminophen (TYLENOL) tablet 1,000 mg (1,000 mg Oral Given 02/01/21 2101)  morphine 4 MG/ML injection 4 mg (4 mg Intravenous Given 02/01/21 2101)     ED Discharge Orders     None        Note:  This document was prepared using Dragon voice recognition software and may include unintentional dictation errors.    James Starch, MD 02/01/21 2147    James Starch, MD 02/01/21 [phone]

## 2021-02-02 DIAGNOSIS — K529 Noninfective gastroenteritis and colitis, unspecified: Secondary | ICD-10-CM

## 2021-02-02 DIAGNOSIS — E785 Hyperlipidemia, unspecified: Secondary | ICD-10-CM

## 2021-02-02 DIAGNOSIS — I2583 Coronary atherosclerosis due to lipid rich plaque: Secondary | ICD-10-CM

## 2021-02-02 DIAGNOSIS — N182 Chronic kidney disease, stage 2 (mild): Secondary | ICD-10-CM

## 2021-02-02 DIAGNOSIS — I2510 Atherosclerotic heart disease of native coronary artery without angina pectoris: Secondary | ICD-10-CM

## 2021-02-02 DIAGNOSIS — I351 Nonrheumatic aortic (valve) insufficiency: Secondary | ICD-10-CM

## 2021-02-02 DIAGNOSIS — N400 Enlarged prostate without lower urinary tract symptoms: Secondary | ICD-10-CM

## 2021-02-02 DIAGNOSIS — I10 Essential (primary) hypertension: Secondary | ICD-10-CM

## 2021-02-02 DIAGNOSIS — K8020 Calculus of gallbladder without cholecystitis without obstruction: Secondary | ICD-10-CM

## 2021-02-02 DIAGNOSIS — K219 Gastro-esophageal reflux disease without esophagitis: Secondary | ICD-10-CM

## 2021-02-02 DIAGNOSIS — K858 Other acute pancreatitis without necrosis or infection: Secondary | ICD-10-CM

## 2021-02-02 DIAGNOSIS — Z01810 Encounter for preprocedural cardiovascular examination: Secondary | ICD-10-CM

## 2021-02-02 LAB — COMPREHENSIVE METABOLIC PANEL
ALT: 11 U/L (ref 0–44)
AST: 17 U/L (ref 15–41)
Albumin: 3.7 g/dL (ref 3.5–5.0)
Alkaline Phosphatase: 62 U/L (ref 38–126)
Anion gap: 5 (ref 5–15)
BUN: 37 mg/dL — ABNORMAL HIGH (ref 8–23)
CO2: 23 mmol/L (ref 22–32)
Calcium: 8.2 mg/dL — ABNORMAL LOW (ref 8.9–10.3)
Chloride: 112 mmol/L — ABNORMAL HIGH (ref 98–111)
Creatinine, Ser: 1.93 mg/dL — ABNORMAL HIGH (ref 0.61–1.24)
GFR, Estimated: 34 mL/min — ABNORMAL LOW (ref >60)
Glucose, Bld: 117 mg/dL — ABNORMAL HIGH (ref 70–99)
Potassium: 3.7 mmol/L (ref 3.5–5.1)
Sodium: 140 mmol/L (ref 135–145)
Total Bilirubin: 1.5 mg/dL — ABNORMAL HIGH (ref 0.3–1.2)
Total Protein: 6.8 g/dL (ref 6.5–8.1)

## 2021-02-02 LAB — CBC
HCT: 44.8 % (ref 39.0–52.0)
Hemoglobin: 15 g/dL (ref 13.0–17.0)
MCH: 30.5 pg (ref 26.0–34.0)
MCHC: 33.5 g/dL (ref 30.0–36.0)
MCV: 91.1 fL (ref 80.0–100.0)
Platelets: 162 10*3/uL (ref 150–400)
RBC: 4.92 MIL/uL (ref 4.22–5.81)
RDW: 13.5 % (ref 11.5–15.5)
WBC: 13.8 10*3/uL — ABNORMAL HIGH (ref 4.0–10.5)
nRBC: 0 % (ref 0.0–0.2)

## 2021-02-02 LAB — BASIC METABOLIC PANEL
Anion gap: 8 (ref 5–15)
BUN: 40 mg/dL — ABNORMAL HIGH (ref 8–23)
CO2: 20 mmol/L — ABNORMAL LOW (ref 22–32)
Calcium: 7.9 mg/dL — ABNORMAL LOW (ref 8.9–10.3)
Chloride: 109 mmol/L (ref 98–111)
Creatinine, Ser: 1.97 mg/dL — ABNORMAL HIGH (ref 0.61–1.24)
GFR, Estimated: 33 mL/min — ABNORMAL LOW (ref >60)
Glucose, Bld: 164 mg/dL — ABNORMAL HIGH (ref 70–99)
Potassium: 3.8 mmol/L (ref 3.5–5.1)
Sodium: 137 mmol/L (ref 135–145)

## 2021-02-02 LAB — LIPASE, BLOOD: Lipase: 713 U/L — ABNORMAL HIGH (ref 11–51)

## 2021-02-02 MED ORDER — OXYCODONE HCL 5 MG PO TABS
5.0000 mg | ORAL_TABLET | ORAL | Status: DC | PRN
  Administered 2021-02-02 – 2021-02-10 (×16): 5 mg via ORAL
  Filled 2021-02-02 (×16): qty 1

## 2021-02-02 MED ORDER — LACTATED RINGERS IV BOLUS
1000.0000 mL | Freq: Once | INTRAVENOUS | Status: AC
  Administered 2021-02-02: 1000 mL via INTRAVENOUS

## 2021-02-02 MED ORDER — LACTATED RINGERS IV SOLN: INTRAVENOUS | Status: DC

## 2021-02-02 NOTE — Plan of Care (Signed)

## 2021-02-02 NOTE — Consult Note (Signed)
Cardiology Consultation:   Patient ID: James Mora; [id number]; Oct 12, 1935   Admit date: 02/01/2021 Date of Consult: 02/02/2021  Primary Care Provider: [name], PA-C Primary Cardiologist: Fletcher Anon Primary Electrophysiologist:  None   Patient Profile:   James Mora is a 85 y.o. male with a hx of CAD medically managed, aortic insufficiency, CKD stage II, hypertensive heart disease, and hyperlipidemia who is being seen today for preoperative cardiac risk stratification at the request of Dr. Windell Moment, MD.  History of Present Illness:   Mr. Lorimer underwent LHC in 2000 which showed severe distal LAD disease with left to left collaterals that was medically managed at that time.  He was admitted in 03/2016 with an NSTEMI.  Echo showed mild proximal LAD stenosis estimated at 30% with possible occlusion of a small PL 3 branch.  LV SF was normal.  Medical therapy was recommended.  Echo at that time showed normal LV systolic function with stable mild to moderate aortic valve insufficiency.  Most recent echo from 02/2018 showed an EF of 55 to 60%, mild LVH, no regional wall motion abnormalities, grade 1 diastolic dysfunction, stable mild to moderate aortic valve insufficiency, mild mitral regurgitation, mildly dilated left atrium, and normal RV systolic function and ventricular cavity size.  He was last seen in the office in 12/2020 and was doing well from a cardiac perspective.  He was admitted to the hospital on 02/01/2021 with generalized abdominal pain that began earlier that morning with associated nausea, vomiting, and diarrhea.  CTA of the abdomen/pelvis showed marked severity enteritis involving the proximal and mid duodenum and findings consistent with adjacent acute pancreatitis along with cholelithiasis.  EKG showed NSR, 90 bpm, RBBB, baseline artifact, nonspecific inferolateral ST-T changes.  High-sensitivity troponin negative x2.  Lipase 1,666 trending to 713.  AST/ALT normal.  WBC  13.8.  BUN 40.  Serum creatinine 1.97.  Leading up to the above symptoms, he was doing well from a cardiac perspective and without symptoms concerning for angina, dyspnea, palpitations, dizziness, presyncope, or syncope. He continues to perform ADL without assistance. Currently, he continues to note 4/10 abdominal pain and has not vomited in 1.5 hours. No further diarrhea.    - [facility] Activity Status Index: > 4 METs without cardiac limitation   - Revised Cardiac Risk Index: Moderate risk for noncardiac surgery with an estimated rate of 6.6% for adverse cardiopulmonary event in the perioperative timeframe    Past Medical History:  Diagnosis Date   Acid reflux    Aortic insufficiency    a. 03/2016 Echo: EF 55-60%, no rwma, Gr1 DD, mild to mod AI, mild TR.   CAD (coronary artery disease)    a. 2000 Cath: Severe distal LAD disease with left to left collaterals;  b. 2008 MV: non-ischemic; c. 03/2016 Cath: LM nl, LAD 30p, D1/D2 mod, nl, LCX nl, OM1/2 nl w/ L-->L collats to apical LAD, RCA dominant, nl. EF 55-65%.   Hypertensive heart disease 2005   unspecified   Mixed hyperlipidemia    mixed    Past Surgical History:  Procedure Laterality Date   CARDIAC CATHETERIZATION  2000   left heart   CARDIAC CATHETERIZATION N/A 03/22/2016   Procedure: Left Heart Cath And Coronary Angiography;  Surgeon: Minna Merritts, MD;  Location: [facility];  Service: Cardiovascular;  Laterality: N/A;   COLONOSCOPY  2008   Dr. Allen Norris     Home Meds: Prior to Admission medications   Medication Sig Start Date End  Date Taking? Authorizing Provider  aspirin 81 MG tablet Take 81 mg by mouth daily.   Yes [provider]  atorvastatin (LIPITOR) 20 MG tablet Take 1 tablet (20 mg total) by mouth daily at 6 PM. 02/05/20  Yes Chrismon, Vickki Muff, PA-C  bismuth subsalicylate (PEPTO BISMOL) 262 MG/15ML suspension Take 30 mLs by mouth every 6 (six) hours as needed (for nausea, and vomiting.).   Yes [provider]  carvedilol (COREG) 12.5 MG tablet TAKE 1 TABLET(12.5 MG) BY MOUTH TWICE DAILY 02/05/20  Yes Chrismon, Vickki Muff, PA-C  chlorthalidone (HYGROTON) 25 MG tablet TAKE 1 TABLET(25 MG) BY MOUTH DAILY 05/14/20  Yes Wellington Hampshire, MD  enalapril (VASOTEC) 20 MG tablet TAKE 1 TABLET(20 MG) BY MOUTH DAILY 02/05/20  Yes Chrismon, Vickki Muff, PA-C  ibuprofen (ADVIL) 200 MG tablet Take 200 mg by mouth every 6 (six) hours as needed for moderate pain.   Yes [provider]  loperamide (IMODIUM A-D) 2 MG tablet Take 2 mg by mouth 4 (four) times daily as needed for diarrhea or loose stools.   Yes [provider]  nitroGLYCERIN (NITROSTAT) 0.4 MG SL tablet Place 0.4 mg under the tongue every 5 (five) minutes as needed.   Yes [provider]  omeprazole (PRILOSEC) 20 MG capsule TAKE 2 CAPSULES BY MOUTH EVERY DAY 12/29/20  Yes Chrismon, Vickki Muff, PA-C  silodosin (RAPAFLO) 8 MG CAPS capsule Take 1 capsule (8 mg total) by mouth daily with breakfast. Patient taking differently: Take 8 mg by mouth daily with breakfast. For 1 month to end 02-11-21. 01/14/21  Yes Stoioff, Ronda Fairly, MD  lovastatin (ALTOPREV) 40 MG 24 hr tablet Take 80 mg by mouth at bedtime.    05/31/19  [provider]  simvastatin (ZOCOR) 40 MG tablet Take 40 mg by mouth at bedtime.    10/20/11  [provider]    Inpatient Medications: Scheduled Meds:  atorvastatin  20 mg Oral q1800   carvedilol  12.5 mg Oral BID WC   enoxaparin (LOVENOX) injection  40 mg Subcutaneous Q24H   pantoprazole (PROTONIX) IV  40 mg Intravenous Q12H   tamsulosin  0.4 mg Oral QPC breakfast   Continuous Infusions:  lactated ringers 150 mL/hr at 02/02/21 1456   PRN Meds: acetaminophen **OR** acetaminophen, magnesium hydroxide, morphine injection, nitroGLYCERIN, ondansetron **OR** ondansetron (ZOFRAN) IV, oxyCODONE, traZODone  Allergies:  No Known Allergies  Social History:   Social History   Socioeconomic History    Marital status: Married    Spouse name: Not on file   Number of children: 1   Years of education: Not on file   Highest education level: Not on file  Occupational History   Not on file  Tobacco Use   Smoking status: Never   Smokeless tobacco: Never  Vaping Use   Vaping Use: Never used  Substance and Sexual Activity   Alcohol use: No   Drug use: No   Sexual activity: Not on file  Other Topics Concern   Not on file  Social History Narrative   Not on file   Social Determinants of Health   Financial Resource Strain: Not on file  Food Insecurity: Not on file  Transportation Needs: Not on file  Physical Activity: Not on file  Stress: Not on file  Social Connections: Not on file  Intimate Partner Violence: Not on file     Family History:   Family History  Family history unknown: Yes    ROS:  Review  of Systems  Constitutional:  Positive for malaise/fatigue. Negative for chills, diaphoresis, fever and weight loss.  HENT:  Negative for congestion.   Eyes:  Negative for discharge and redness.  Respiratory:  Negative for cough, sputum production, shortness of breath and wheezing.   Cardiovascular:  Negative for chest pain, palpitations, orthopnea, claudication, leg swelling and PND.  Gastrointestinal:  Positive for abdominal pain, diarrhea, nausea and vomiting. Negative for blood in stool, constipation, heartburn and melena.  Musculoskeletal:  Negative for falls and myalgias.  Skin:  Negative for rash.  Neurological:  Negative for dizziness, tingling, tremors, sensory change, speech change, focal weakness, loss of consciousness and weakness.  Endo/Heme/Allergies:  Does not bruise/bleed easily.  Psychiatric/Behavioral:  Negative for substance abuse. The patient is not nervous/anxious.   All other systems reviewed and are negative.    Physical Exam/Data:   Vitals:   02/01/21 2319 02/02/21 0415 02/02/21 0811 02/02/21 1500  BP: (!) 150/94 136/88 118/86 122/83  Pulse: 95 98  (!) 102 99  Resp: 20 20 16 18   Temp: 98 F (36.7 C) 98.7 F (37.1 C) 98.9 F (37.2 C) 98.6 F (37 C)  TempSrc:   Oral Oral  SpO2: 99% 96% 97% 97%  Weight: 73.5 kg     Height: 5\' 10"  (1.778 m)       Intake/Output Summary (Last 24 hours) at 02/02/2021 1625 Last data filed at 02/02/2021 1500 Gross per 24 hour  Intake 1403.53 ml  Output --  Net 1403.53 ml   Filed Weights   02/01/21 1644 02/01/21 2319  Weight: 74.8 kg 73.5 kg   Body mass index is 23.25 kg/m.   Physical Exam: General: Well developed, well nourished, in no acute distress. Head: Normocephalic, atraumatic, sclera non-icteric, no xanthomas, nares without discharge.  Neck: Negative for carotid bruits. JVD not elevated. Lungs: Clear bilaterally to auscultation without wheezes, rales, or rhonchi. Breathing is unlabored. Heart: RRR with S1 S2. I/VI diastolic murmur LSB, no rubs, or gallops appreciated. Abdomen: Soft, mild diffuse tenderness to palpation, non-distended with normoactive bowel sounds. No hepatomegaly. No rebound/guarding. No obvious abdominal masses. Msk:  Strength and tone appear normal for age. Extremities: No clubbing or cyanosis. No edema. Distal pedal pulses are 2+ and equal bilaterally. Neuro: Alert and oriented X 3. No facial asymmetry. No focal deficit. Moves all extremities spontaneously. Psych:  Responds to questions appropriately with a normal affect.   EKG:  The EKG was personally reviewed and demonstrates: NSR, 90 bpm, RBBB, baseline artifact, nonspecific inferolateral ST-T changes Telemetry:  Telemetry was personally reviewed and demonstrates: SR with artifact   Weights: Filed Weights   02/01/21 1644 02/01/21 2319  Weight: 74.8 kg 73.5 kg    Relevant CV Studies:  2D echo 02/2018: - Left ventricle: The cavity size was mildly dilated. Wall    thickness was increased in a pattern of mild LVH. Systolic    function was normal. The estimated ejection fraction was in the    range of 55% to  60%. Wall motion was normal; there were no    regional wall motion abnormalities. Doppler parameters are    consistent with abnormal left ventricular relaxation (grade 1    diastolic dysfunction).  - Aortic valve: There was mild to moderate regurgitation.  - Mitral valve: There was mild regurgitation.  - Left atrium: The atrium was mildly dilated.  - Right ventricle: The cavity size was normal. Wall thickness was    normal. Systolic function was normal.  __________  LHC 03/2016: Coronary  angiography:  Coronary dominance: Right  Left mainstem:   Large vessel that bifurcates into the LAD and left circumflex, no significant disease noted  Left anterior descending (LAD):   Large vessel that extends to the apical region, diagonal branch 2 of moderate size, 30% proximal LAD disease.   Left circumflex (LCx):  Large vessel with OM branch 2, no significant disease noted  Right coronary artery (RCA):  Right dominant vessel with PL and PDA, no significant disease noted  Left ventriculography: Left ventricular systolic function is normal, LVEF is estimated at 55-65%, there is no significant mitral regurgitation , no significant aortic valve stenosis  Final Conclusions:   Mild proximal LAD disease,  Small vessel disease No other regions of significant stenosis  Recommendations:  Medical management recommended, Continue current meds, Patient is nonsmoker, no diabetes, lipids at goal Follow up with Dr. Fletcher Anon __________  2D echo 03/2016: - Left ventricle: The cavity size was normal. There was mild    concentric hypertrophy. Systolic function was normal. The    estimated ejection fraction was in the range of 55% to 60%. Wall    motion was normal; there were no regional wall motion    abnormalities. Doppler parameters are consistent with abnormal    left ventricular relaxation (grade 1 diastolic dysfunction).  - Aortic valve: Transvalvular velocity was within the normal range.    There was  no stenosis. There was mild to moderate regurgitation.    Valve area (Vmax): 3 cm^2.  - Mitral valve: Transvalvular velocity was within the normal range.    There was no evidence for stenosis. There was no regurgitation.  - Right ventricle: The cavity size was normal. Wall thickness was    normal. Systolic function was normal.  - Tricuspid valve: There was mild regurgitation. __________  2D echo 03/2011: - Left ventricle: The cavity size was mildly dilated. Wall thickness    was increased in a pattern of mild LVH. There was mild concentric    hypertrophy. Systolic function was normal. The estimated ejection    fraction was in the range of 55% to 60%. Wall motion was normal;    there were no regional wall motion abnormalities. Doppler    parameters are consistent with abnormal left ventricular    relaxation (grade 1 diastolic dysfunction).  - Aortic valve: Mild to moderate regurgitation. Valve area: 2.42cm^2    (Vmax).  - Mitral valve: Mild regurgitation.  - Left atrium: The atrium was mildly dilated.  - Atrial septum: No defect or patent foramen ovale was identified.  Laboratory Data:  Chemistry Recent Labs  Lab 02/01/21 1658 02/02/21 0448 02/02/21 1510  NA 139 140 137  K 4.1 3.7 3.8  CL 108 112* 109  CO2 21* 23 20*  GLUCOSE 184* 117* 164*  BUN 31* 37* 40*  CREATININE 1.78* 1.93* 1.97*  CALCIUM 9.1 8.2* 7.9*  GFRNONAA 38* 34* 33*  ANIONGAP 10 5 8     Recent Labs  Lab 02/01/21 1658 02/02/21 0448  PROT 7.4 6.8  ALBUMIN 4.2 3.7  AST 18 17  ALT 13 11  ALKPHOS 69 62  BILITOT 1.7* 1.5*   Hematology Recent Labs  Lab 02/01/21 1658 02/02/21 0448  WBC 13.6* 13.8*  RBC 4.60 4.92  HGB 14.5 15.0  HCT 42.2 44.8  MCV 91.7 91.1  MCH 31.5 30.5  MCHC 34.4 33.5  RDW 13.3 13.5  PLT 183 162   Cardiac EnzymesNo results for input(s): TROPONINI in the last 168 hours. No results for input(s):  TROPIPOC in the last 168 hours.  BNPNo results for input(s): BNP, PROBNP in the  last 168 hours.  DDimer No results for input(s): DDIMER in the last 168 hours.  Radiology/Studies:  DG Chest Portable 1 View  Result Date: 02/01/2021 IMPRESSION: No active disease. Electronically Signed   By: Dorise Bullion III M.D   On: 02/01/2021 18:54   CT Angio Abd/Pel W and/or Wo Contrast  Result Date: 02/01/2021 IMPRESSION: VASCULAR No evidence of aneurysm, dissection, vasculitis or significant stenosis of the arterial structures within the abdomen or pelvis. NON-VASCULAR 1. Marked severity enteritis involving the proximal and mid duodenum. 2. Findings consistent with adjacent acute pancreatitis cannot be excluded. Correlation with pancreatic enzymes is recommended. 3. Cholelithiasis. 4. Numerous bilateral simple renal cysts. 5. Small hiatal hernia. 6. Sigmoid diverticulosis. Electronically Signed   By: Virgina Norfolk M.D.   On: 02/01/2021 19:07    Assessment and Plan:   1.  Preoperative cardiac risk stratification in the setting of gallstone pancreatitis: -He continues to do well from a cardiac perspective and is without symptoms of angina or decompensation  -Per Revised Cardiac Index, he is moderate risk for noncardiac surgery with an estimated rate of 6.6% for adverse cardiac event in the perioperative timeframe -Per [facility] Activity Status Index, he is able to achieve > 4 METs without cardiac limitation -Agree with the holding of chlorthalidone -He may proceed with noncardiac surgery if/as indicated without further cardiac workup  2.  CAD involving the native coronary arteries without angina: -High-sensitivity troponin negative x2 -ASA has been held upon admission in the perioperative setting, resume when safely able -We will obtain an echo as outlined below, though this does not need to occur prior to surgery   3.  Aortic insufficiency: -Stable with mild to moderate aortic valve regurgitation by echo in 02/2018 -Update echo, will not need to be completed prior to surgery   4.   HTN: -Blood pressure currently well controlled -PTA carvedilol -Chlorthalidone held with gallstone pancreatitis -Enalapril held with acute on CKD  5.  Acute on CKD stage II: -IV hydration  6.  HLD: -LDL 62 with a triglyceride of 68 this admission -Remains on PTA atorvastatin   For questions or updates, please contact [place] Please consult [email or web address] for contact info under Cardiology/STEMI.   Signed, Christell Faith, PA-C [facility] Pager: [phone] 02/02/2021, 4:25 PM

## 2021-02-02 NOTE — Plan of Care (Signed)
  Problem: Education: Goal: Knowledge of General Education information will improve Description: Including pain rating scale, medication(s)/side effects and non-pharmacologic comfort measures Outcome: Progressing   Problem: Clinical Measurements: Goal: Respiratory complications will improve Outcome: Progressing   Problem: Pain Managment: Goal: General experience of comfort will improve Outcome: Progressing   Problem: Skin Integrity: Goal: Risk for impaired skin integrity will decrease Outcome: Progressing

## 2021-02-02 NOTE — Consult Note (Signed)
Consultation  Referring Provider:    Dr. Sidney Ace Admit date: 6/26 Consult date : 6/27        Reason for Consultation:   Acute pancreatitis           HPI:   Hondo Nanda Mattix is a 85 y.o. gentleman with history of CAD and hypertension who developed severe abdominal pain yesterday after drinking some coffee. Mostly left sided and to the back. Some mild diarrhea. On presentation he was found to have elevated lipase with normal LFT's except mildly elevated T. Bili. Unclear if he got any IV boluses of fluids but he was on 100 ml/hr when I saw him. Cholelithiasis on CT but no biliary ductal dilatation. No blood thinners, no NSAIDS, no alcohol. Started taking bactrim recently.  Past Medical History:  Diagnosis Date  . Acid reflux   . Aortic insufficiency    a. 03/2016 Echo: EF 55-60%, no rwma, Gr1 DD, mild to mod AI, mild TR.  Marland Kitchen CAD (coronary artery disease)    a. 2000 Cath: Severe distal LAD disease with left to left collaterals;  b. 2008 MV: non-ischemic; c. 03/2016 Cath: LM nl, LAD 30p, D1/D2 mod, nl, LCX nl, OM1/2 nl w/ L-->L collats to apical LAD, RCA dominant, nl. EF 55-65%.  . Hypertensive heart disease 2005   unspecified  . Mixed hyperlipidemia    mixed    Past Surgical History:  Procedure Laterality Date  . CARDIAC CATHETERIZATION  2000   left heart  . CARDIAC CATHETERIZATION N/A 03/22/2016   Procedure: Left Heart Cath And Coronary Angiography;  Surgeon: Minna Merritts, MD;  Location: [facility];  Service: Cardiovascular;  Laterality: N/A;  . COLONOSCOPY  2008   Dr. Allen Norris    Family History  Family history unknown: Yes     Social History   Tobacco Use  . Smoking status: Never  . Smokeless tobacco: Never  Vaping Use  . Vaping Use: Never used  Substance Use Topics  . Alcohol use: No  . Drug use: No    Prior to Admission medications   Medication Sig Start Date End Date Taking? Authorizing Provider  aspirin 81 MG tablet Take 81 mg by mouth daily.   Yes [provider]  atorvastatin (LIPITOR) 20 MG tablet Take 1 tablet (20 mg total) by mouth daily at 6 PM. 02/05/20  Yes Chrismon, Vickki Muff, PA-C  bismuth subsalicylate (PEPTO BISMOL) 262 MG/15ML suspension Take 30 mLs by mouth every 6 (six) hours as needed (for nausea, and vomiting.).   Yes [provider]  carvedilol (COREG) 12.5 MG tablet TAKE 1 TABLET(12.5 MG) BY MOUTH TWICE DAILY 02/05/20  Yes Chrismon, Vickki Muff, PA-C  chlorthalidone (HYGROTON) 25 MG tablet TAKE 1 TABLET(25 MG) BY MOUTH DAILY 05/14/20  Yes Wellington Hampshire, MD  enalapril (VASOTEC) 20 MG tablet TAKE 1 TABLET(20 MG) BY MOUTH DAILY 02/05/20  Yes Chrismon, Vickki Muff, PA-C  ibuprofen (ADVIL) 200 MG tablet Take 200 mg by mouth every 6 (six) hours as needed for moderate pain.   Yes [provider]  loperamide (IMODIUM A-D) 2 MG tablet Take 2 mg by mouth 4 (four) times daily as needed for diarrhea or loose stools.   Yes [provider]  nitroGLYCERIN (NITROSTAT) 0.4 MG SL tablet Place 0.4 mg under the tongue every 5 (five) minutes as needed.   Yes [provider]  omeprazole (PRILOSEC) 20 MG capsule TAKE 2 CAPSULES BY MOUTH EVERY DAY 12/29/20  Yes Chrismon, Vickki Muff, PA-C  silodosin (RAPAFLO) 8 MG CAPS capsule Take 1 capsule (8 mg total) by mouth daily with breakfast. Patient taking differently: Take 8 mg by mouth daily with breakfast. For 1 month to end 02-11-21. 01/14/21  Yes Stoioff, Ronda Fairly, MD  lovastatin (ALTOPREV) 40 MG 24 hr tablet Take 80 mg by mouth at bedtime.    05/31/19  [provider]  simvastatin (ZOCOR) 40 MG tablet Take 40 mg by mouth at bedtime.    10/20/11  [provider]    Current Facility-Administered Medications  Medication Dose Route Frequency Provider Last Rate Last Admin  . acetaminophen (TYLENOL) tablet 650 mg  650 mg Oral Q6H PRN Mansy, Jan A, MD       Or  . acetaminophen (TYLENOL) suppository 650 mg  650 mg Rectal Q6H PRN Mansy, Jan A, MD      . atorvastatin  (LIPITOR) tablet 20 mg  20 mg Oral q1800 Mansy, Jan A, MD      . carvedilol (COREG) tablet 12.5 mg  12.5 mg Oral BID WC Mansy, Jan A, MD   12.5 mg at 02/02/21 0811  . enoxaparin (LOVENOX) injection 40 mg  40 mg Subcutaneous Q24H Mansy, Jan A, MD   40 mg at 02/01/21 2337  . lactated ringers infusion   Intravenous Continuous Loletha Grayer, MD 150 mL/hr at 02/02/21 1456 New Bag at 02/02/21 1456  . magnesium hydroxide (MILK OF MAGNESIA) suspension 30 mL  30 mL Oral Daily PRN Mansy, Jan A, MD      . morphine 2 MG/ML injection 2 mg  2 mg Intravenous Q4H PRN Mansy, Jan A, MD   2 mg at 02/02/21 0806  . nitroGLYCERIN (NITROSTAT) SL tablet 0.4 mg  0.4 mg Sublingual Q5 min PRN Mansy, Jan A, MD      . ondansetron [facility]) tablet 4 mg  4 mg Oral Q6H PRN Mansy, Jan A, MD       Or  . ondansetron [facility]) injection 4 mg  4 mg Intravenous Q6H PRN Mansy, Jan A, MD   4 mg at 02/02/21 0345  . oxyCODONE (Oxy IR/ROXICODONE) immediate release tablet 5 mg  5 mg Oral Q4H PRN Loletha Grayer, MD   5 mg at 02/02/21 1037  . pantoprazole (PROTONIX) injection 40 mg  40 mg Intravenous Q12H Mansy, Jan A, MD   40 mg at 02/02/21 0803  . tamsulosin (FLOMAX) capsule 0.4 mg  0.4 mg Oral QPC breakfast Mansy, Jan A, MD   0.4 mg at 02/02/21 0804  . traZODone (DESYREL) tablet 25 mg  25 mg Oral QHS PRN Mansy, Arvella Merles, MD        Allergies as of 02/01/2021  . (No Known Allergies)     Review of Systems:    All systems reviewed and negative except where noted in HPI.  Review of Systems  Constitutional:  Negative for chills and fever.  Respiratory:  Negative for cough.   Cardiovascular:  Negative for chest pain.  Gastrointestinal:  Positive for abdominal pain and diarrhea. Negative for blood in stool and melena.  Musculoskeletal:  Negative for joint pain.  Skin:  Negative for itching and rash.  Neurological:  Negative for focal weakness.  Psychiatric/Behavioral:  Negative for substance abuse.   All other systems reviewed and  are negative.     Physical Exam:  Vital signs in last 24 hours: Temp:  [98 F (36.7 C)-98.9 F (37.2 C)] 98.6 F (37 C) (06/27 1500) Pulse Rate:  [64-102] 99 (06/27 1500) Resp:  [  16-30] 18 (06/27 1500) BP: (118-164)/(66-94) 122/83 (06/27 1500) SpO2:  [96 %-100 %] 97 % (06/27 1500) Weight:  [73.5 kg-74.8 kg] 73.5 kg (06/26 2319) Last BM Date: 02/01/21 General:   In minimal distress Head:  Normocephalic and atraumatic. Eyes:   No icterus.   Conjunctiva pink. Mouth: Mucosa pink moist, no lesions. Neck:  Supple; no masses felt Lungs:  No respiratory distress Abdomen:   Flat, soft, tenderness in lower abdomen Msk:  MAEW x4, No clubbing or cyanosis. Strength 5/5. Symmetrical without gross deformities. Neurologic:  Alert and  oriented x4;  Cranial nerves II-XII intact.  Skin:  Warm, dry, pink without significant lesions or rashes. Psych:  Alert and cooperative. Normal affect.  LAB RESULTS: Recent Labs    02/01/21 1658 02/02/21 0448  WBC 13.6* 13.8*  HGB 14.5 15.0  HCT 42.2 44.8  PLT 183 162   BMET Recent Labs    02/01/21 1658 02/02/21 0448 02/02/21 1510  NA 139 140 137  K 4.1 3.7 3.8  CL 108 112* 109  CO2 21* 23 20*  GLUCOSE 184* 117* 164*  BUN 31* 37* 40*  CREATININE 1.78* 1.93* 1.97*  CALCIUM 9.1 8.2* 7.9*   LFT Recent Labs    02/02/21 0448  PROT 6.8  ALBUMIN 3.7  AST 17  ALT 11  ALKPHOS 62  BILITOT 1.5*   PT/INR No results for input(s): LABPROT, INR in the last 72 hours.  STUDIES: DG Chest Portable 1 View  Result Date: 02/01/2021 CLINICAL DATA:  Epigastric pain. EXAM: PORTABLE CHEST 1 VIEW COMPARISON:  March 19, 2016 FINDINGS: Stable cardiomegaly. Stable atelectasis in the left base. No pneumothorax. No other interval changes or acute abnormalities. IMPRESSION: No active disease. Electronically Signed   By: Dorise Bullion III M.D   On: 02/01/2021 18:54   CT Angio Abd/Pel W and/or Wo Contrast  Result Date: 02/01/2021 CLINICAL DATA:  Abdominal pain  and emesis. EXAM: CTA ABDOMEN AND PELVIS WITHOUT AND WITH CONTRAST TECHNIQUE: Multidetector CT imaging of the abdomen and pelvis was performed using the standard protocol during bolus administration of intravenous contrast. Multiplanar reconstructed images and MIPs were obtained and reviewed to evaluate the vascular anatomy. CONTRAST:  54mL OMNIPAQUE IOHEXOL 350 MG/ML SOLN COMPARISON:  None. FINDINGS: VASCULAR Aorta: Normal caliber aorta without aneurysm, dissection, vasculitis or significant stenosis. Celiac: Patent without evidence of aneurysm, dissection, vasculitis or significant stenosis. SMA: Patent without evidence of aneurysm, dissection, vasculitis or significant stenosis. Renals: Both renal arteries are patent without evidence of aneurysm, dissection, vasculitis, fibromuscular dysplasia or significant stenosis. IMA: Patent without evidence of aneurysm, dissection, vasculitis or significant stenosis. Inflow: Patent without evidence of aneurysm, dissection, vasculitis or significant stenosis. Proximal Outflow: Bilateral common femoral and visualized portions of the superficial and profunda femoral arteries are patent without evidence of aneurysm, dissection, vasculitis or significant stenosis. Veins: No obvious venous abnormality within the limitations of this arterial phase study. Review of the MIP images confirms the above findings. NON-VASCULAR Lower chest: No acute abnormality. Hepatobiliary: There is diffuse fatty infiltration of the liver parenchyma. No focal liver abnormality is seen. A 7 mm gallstone is seen within the gallbladder lumen, without evidence of gallbladder wall thickening or biliary dilatation. Pancreas: Peripancreatic inflammatory fat stranding is seen. This also involves the adjacent duodenum and extends along the midline of the mid to lower abdomen. There is no evidence of pancreatic duct dilatation. Spleen: Normal in size without focal abnormality. Adrenals/Urinary Tract: Adrenal  glands are unremarkable. Numerous bilateral simple renal cysts of various  sizes are seen. There is no evidence of renal calculi or hydronephrosis. Bladder is unremarkable. Stomach/Bowel: There is a small hiatal hernia. Appendix appears normal. No evidence of bowel dilatation. Marked severity, asymmetric thickening of the proximal and mid duodenum is seen. Associated Peri duodenal inflammatory fat stranding is noted. Noninflamed diverticula are seen throughout the sigmoid colon. Lymphatic: No abnormal abdominal or pelvic lymph nodes are identified. Reproductive: Moderate to marked severity prostate gland enlargement is seen. Other: A 3.0 cm x 2.2 cm fat containing right inguinal hernia is seen. A similar appearing 2.0 cm x 1.7 cm fat containing left inguinal hernia is noted. No abdominopelvic ascites. Musculoskeletal: Multilevel degenerative changes are seen throughout the lumbar spine. IMPRESSION: VASCULAR No evidence of aneurysm, dissection, vasculitis or significant stenosis of the arterial structures within the abdomen or pelvis. NON-VASCULAR 1. Marked severity enteritis involving the proximal and mid duodenum. 2. Findings consistent with adjacent acute pancreatitis cannot be excluded. Correlation with pancreatic enzymes is recommended. 3. Cholelithiasis. 4. Numerous bilateral simple renal cysts. 5. Small hiatal hernia. 6. Sigmoid diverticulosis. Electronically Signed   By: Virgina Norfolk M.D.   On: 02/01/2021 19:07       Impression / Plan:   85 y/o gentleman with acute pancreatitis likely secondary to gallstones. Mild elevated T. Bili. BUN has risen from presentation which means he hasn't been adequately hydrated. Another potential cause of his pancreatitis is bactrim but more likely this is gallstone.  - recommend LR bolus 1 L and maintenance of at least 150 (ideally 200 ml/hr). Recheck BMP and repeat bolus as needed - will order RUQ - daily CMP's to monitor LFT's - pain control - advanced diet  as tolerated but likely NPO for now as still in pain - agree with surgery consult prior to discharge for consideration of cholecystectomy  Please call with any questions or concerns.  Raylene Miyamoto MD, MPH [place]

## 2021-02-02 NOTE — Progress Notes (Signed)
Patient ID: James Mora, male   DOB: Mar 19, 1936, 85 y.o.   MRN: [id number] [facility] PROGRESS NOTE  Shaheen Mende Nuon HBZ:[id number] DOB: 05-08-1936 DOA: 02/01/2021 PCP: [name], PA-C  HPI/Subjective: Patient seen this morning was having severe abdominal pain.  8 out of 10 in intensity.  All started yesterday.  Very severe upon coming in 10 out of 10 intensity.  Also having some hiccups.  Had some diarrhea prior to coming in.  Diagnosed with acute pancreatitis  Objective: Vitals:   02/02/21 0415 02/02/21 0811  BP: 136/88 118/86  Pulse: 98 (!) 102  Resp: 20 16  Temp: 98.7 F (37.1 C) 98.9 F (37.2 C)  SpO2: 96% 97%    Intake/Output Summary (Last 24 hours) at 02/02/2021 1357 Last data filed at 02/02/2021 0600 Gross per 24 hour  Intake 387.04 ml  Output --  Net 387.04 ml   Filed Weights   02/01/21 1644 02/01/21 2319  Weight: 74.8 kg 73.5 kg    ROS: Review of Systems  Respiratory:  Negative for cough and shortness of breath.   Cardiovascular:  Negative for chest pain.  Gastrointestinal:  Positive for abdominal pain, diarrhea and nausea. Negative for vomiting.  Exam: Physical Exam HENT:     Head: Normocephalic.     Mouth/Throat:     Pharynx: No oropharyngeal exudate.  Eyes:     General: Lids are normal.     Conjunctiva/sclera: Conjunctivae normal.     Pupils: Pupils are equal, round, and reactive to light.  Cardiovascular:     Rate and Rhythm: Normal rate and regular rhythm.     Heart sounds: Normal heart sounds, S1 normal and S2 normal.  Pulmonary:     Breath sounds: Normal breath sounds. No decreased breath sounds, wheezing, rhonchi or rales.  Abdominal:     General: There is distension.     Palpations: Abdomen is soft.     Tenderness: There is generalized abdominal tenderness.  Musculoskeletal:     Right lower leg: No swelling.     Left lower leg: No swelling.  Skin:    General: Skin is warm.     Findings: No rash.  Neurological:     Mental  Status: He is alert and oriented to person, place, and time.     Data Reviewed: Basic Metabolic Panel: Recent Labs  Lab 02/01/21 1658 02/02/21 0448  NA 139 140  K 4.1 3.7  CL 108 112*  CO2 21* 23  GLUCOSE 184* 117*  BUN 31* 37*  CREATININE 1.78* 1.93*  CALCIUM 9.1 8.2*  MG 1.7  --    Liver Function Tests: Recent Labs  Lab 02/01/21 1658 02/02/21 0448  AST 18 17  ALT 13 11  ALKPHOS 69 62  BILITOT 1.7* 1.5*  PROT 7.4 6.8  ALBUMIN 4.2 3.7   Recent Labs  Lab 02/01/21 1658 02/02/21 0448  LIPASE 1,666* 713*   CBC: Recent Labs  Lab 02/01/21 1658 02/02/21 0448  WBC 13.6* 13.8*  HGB 14.5 15.0  HCT 42.2 44.8  MCV 91.7 91.1  PLT 183 162     Recent Results (from the past 240 hour(s))  Resp Panel by RT-PCR (Flu A&B, Covid) Nasopharyngeal Swab     Status: None   Collection Time: 02/01/21  5:54 PM   Specimen: Nasopharyngeal Swab; Nasopharyngeal(NP) swabs in vial transport medium  Result Value Ref Range Status   SARS Coronavirus 2 by RT PCR NEGATIVE NEGATIVE Final    Comment: (NOTE) SARS-CoV-2 target nucleic acids  are NOT DETECTED.  The SARS-CoV-2 RNA is generally detectable in upper respiratory specimens during the acute phase of infection. The lowest concentration of SARS-CoV-2 viral copies this assay can detect is 138 copies/mL. A negative result does not preclude SARS-Cov-2 infection and should not be used as the sole basis for treatment or other patient management decisions. A negative result may occur with  improper specimen collection/handling, submission of specimen other than nasopharyngeal swab, presence of viral mutation(s) within the areas targeted by this assay, and inadequate number of viral copies(<138 copies/mL). A negative result must be combined with clinical observations, patient history, and epidemiological information. The expected result is Negative.  Fact Sheet for Patients:  EntrepreneurPulse.com.au  Fact Sheet for  Healthcare Providers:  IncredibleEmployment.be  This test is no t yet approved or cleared by the [place] FDA and  has been authorized for detection and/or diagnosis of SARS-CoV-2 by FDA under an Emergency Use Authorization (EUA). This EUA will remain  in effect (meaning this test can be used) for the duration of the COVID-19 declaration under Section 564(b)(1) of the Act, 21 U.S.C.section 360bbb-3(b)(1), unless the authorization is terminated  or revoked sooner.       Influenza A by PCR NEGATIVE NEGATIVE Final   Influenza B by PCR NEGATIVE NEGATIVE Final    Comment: (NOTE) The Xpert Xpress SARS-CoV-2/FLU/RSV plus assay is intended as an aid in the diagnosis of influenza from Nasopharyngeal swab specimens and should not be used as a sole basis for treatment. Nasal washings and aspirates are unacceptable for Xpert Xpress SARS-CoV-2/FLU/RSV testing.  Fact Sheet for Patients: EntrepreneurPulse.com.au  Fact Sheet for Healthcare Providers: IncredibleEmployment.be  This test is not yet approved or cleared by the [place] FDA and has been authorized for detection and/or diagnosis of SARS-CoV-2 by FDA under an Emergency Use Authorization (EUA). This EUA will remain in effect (meaning this test can be used) for the duration of the COVID-19 declaration under Section 564(b)(1) of the Act, 21 U.S.C. section 360bbb-3(b)(1), unless the authorization is terminated or revoked.  Performed at [facility], [address], [place], [place] [address]      Studies: DG Chest Portable 1 View  Result Date: 02/01/2021 CLINICAL DATA:  Epigastric pain. EXAM: PORTABLE CHEST 1 VIEW COMPARISON:  March 19, 2016 FINDINGS: Stable cardiomegaly. Stable atelectasis in the left base. No pneumothorax. No other interval changes or acute abnormalities. IMPRESSION: No active disease. Electronically Signed   By: Dorise Bullion III M.D   On:  02/01/2021 18:54   CT Angio Abd/Pel W and/or Wo Contrast  Result Date: 02/01/2021 CLINICAL DATA:  Abdominal pain and emesis. EXAM: CTA ABDOMEN AND PELVIS WITHOUT AND WITH CONTRAST TECHNIQUE: Multidetector CT imaging of the abdomen and pelvis was performed using the standard protocol during bolus administration of intravenous contrast. Multiplanar reconstructed images and MIPs were obtained and reviewed to evaluate the vascular anatomy. CONTRAST:  5mL OMNIPAQUE IOHEXOL 350 MG/ML SOLN COMPARISON:  None. FINDINGS: VASCULAR Aorta: Normal caliber aorta without aneurysm, dissection, vasculitis or significant stenosis. Celiac: Patent without evidence of aneurysm, dissection, vasculitis or significant stenosis. SMA: Patent without evidence of aneurysm, dissection, vasculitis or significant stenosis. Renals: Both renal arteries are patent without evidence of aneurysm, dissection, vasculitis, fibromuscular dysplasia or significant stenosis. IMA: Patent without evidence of aneurysm, dissection, vasculitis or significant stenosis. Inflow: Patent without evidence of aneurysm, dissection, vasculitis or significant stenosis. Proximal Outflow: Bilateral common femoral and visualized portions of the superficial and profunda femoral arteries are patent without evidence of  aneurysm, dissection, vasculitis or significant stenosis. Veins: No obvious venous abnormality within the limitations of this arterial phase study. Review of the MIP images confirms the above findings. NON-VASCULAR Lower chest: No acute abnormality. Hepatobiliary: There is diffuse fatty infiltration of the liver parenchyma. No focal liver abnormality is seen. A 7 mm gallstone is seen within the gallbladder lumen, without evidence of gallbladder wall thickening or biliary dilatation. Pancreas: Peripancreatic inflammatory fat stranding is seen. This also involves the adjacent duodenum and extends along the midline of the mid to lower abdomen. There is no evidence  of pancreatic duct dilatation. Spleen: Normal in size without focal abnormality. Adrenals/Urinary Tract: Adrenal glands are unremarkable. Numerous bilateral simple renal cysts of various sizes are seen. There is no evidence of renal calculi or hydronephrosis. Bladder is unremarkable. Stomach/Bowel: There is a small hiatal hernia. Appendix appears normal. No evidence of bowel dilatation. Marked severity, asymmetric thickening of the proximal and mid duodenum is seen. Associated Peri duodenal inflammatory fat stranding is noted. Noninflamed diverticula are seen throughout the sigmoid colon. Lymphatic: No abnormal abdominal or pelvic lymph nodes are identified. Reproductive: Moderate to marked severity prostate gland enlargement is seen. Other: A 3.0 cm x 2.2 cm fat containing right inguinal hernia is seen. A similar appearing 2.0 cm x 1.7 cm fat containing left inguinal hernia is noted. No abdominopelvic ascites. Musculoskeletal: Multilevel degenerative changes are seen throughout the lumbar spine. IMPRESSION: VASCULAR No evidence of aneurysm, dissection, vasculitis or significant stenosis of the arterial structures within the abdomen or pelvis. NON-VASCULAR 1. Marked severity enteritis involving the proximal and mid duodenum. 2. Findings consistent with adjacent acute pancreatitis cannot be excluded. Correlation with pancreatic enzymes is recommended. 3. Cholelithiasis. 4. Numerous bilateral simple renal cysts. 5. Small hiatal hernia. 6. Sigmoid diverticulosis. Electronically Signed   By: Virgina Norfolk M.D.   On: 02/01/2021 19:07    Scheduled Meds:  atorvastatin  20 mg Oral q1800   carvedilol  12.5 mg Oral BID WC   enoxaparin (LOVENOX) injection  40 mg Subcutaneous Q24H   pantoprazole (PROTONIX) IV  40 mg Intravenous Q12H   tamsulosin  0.4 mg Oral QPC breakfast   Continuous Infusions:  lactated ringers      Assessment/Plan:  Acute pancreatitis with gallstones.  Give IV fluid bolus and increase IV  fluids 250/h.  Start clear liquid diet.  Add oral pain medication along with IV pain medication.  Case discussed with both general surgery and gastroenterology.  Hold chlorthalidone.  General surgery may take out the gallbladder prior to disposition. Acute enteritis seen on CT scan.  IV PPI therapy History of CAD and hyperlipidemia.  Continue Coreg and atorvastatin Essential hypertension on Coreg and enalapril GERD on IV PPI therapy BPH on Flomax.     Code Status:     Code Status Orders  (From admission, onward)           Start     Ordered   02/01/21 2123  Full code  Continuous        02/01/21 2129           Code Status History     Date Active Date Inactive Code Status Order ID Comments User Context   03/19/2016 2202 03/20/2016 0751 Full Code [id number]  Henreitta Leber, MD Inpatient      Family Communication: Spoke with patient's wife at the bedside Disposition Plan: Status is: Inpatient  Dispo: The patient is from: Home  Anticipated d/c is to: Home              Patient currently being treated for acute pancreatitis.  General surgery may take out the gallbladder prior to disposition   Difficult to place patient.  No  Consultants: General surgery Gastroenterology  Time spent: 28 minutes  [place]

## 2021-02-02 NOTE — Consult Note (Signed)
SURGICAL CONSULTATION NOTE   HISTORY OF PRESENT ILLNESS (HPI):  85 y.o. male presented to [facility] ED for evaluation of abdominal pain. Patient reports abdominal pain since 2 days ago.  He reported the pain was localized to the upper abdomen.  The pain also radiated to his back.  Patient reported pain was very intense even to go to an 8 out of 10.  There was no alleviating or aggravating factor identified.  He came to the ED and he was found with elevated lipase.  CT scan of the abdomen and pelvis shows inflammation of the pancreas and duodenum.  I personally evaluated the images.  Patient also found with cholelithiasis  Surgery is consulted by Dr. Sidney Ace in this context for evaluation and management of gallstone pancreatitis.  PAST MEDICAL HISTORY (PMH):  Past Medical History:  Diagnosis Date   Acid reflux    Aortic insufficiency    a. 03/2016 Echo: EF 55-60%, no rwma, Gr1 DD, mild to mod AI, mild TR.   CAD (coronary artery disease)    a. 2000 Cath: Severe distal LAD disease with left to left collaterals;  b. 2008 MV: non-ischemic; c. 03/2016 Cath: LM nl, LAD 30p, D1/D2 mod, nl, LCX nl, OM1/2 nl w/ L-->L collats to apical LAD, RCA dominant, nl. EF 55-65%.   Hypertensive heart disease 2005   unspecified   Mixed hyperlipidemia    mixed     PAST SURGICAL HISTORY ([place]):  Past Surgical History:  Procedure Laterality Date   CARDIAC CATHETERIZATION  2000   left heart   CARDIAC CATHETERIZATION N/A 03/22/2016   Procedure: Left Heart Cath And Coronary Angiography;  Surgeon: Minna Merritts, MD;  Location: [facility];  Service: Cardiovascular;  Laterality: N/A;   COLONOSCOPY  2008   Dr. Allen Norris     MEDICATIONS:  Prior to Admission medications   Medication Sig Start Date End Date Taking? Authorizing Provider  aspirin 81 MG tablet Take 81 mg by mouth daily.   Yes [provider]  atorvastatin (LIPITOR) 20 MG tablet Take 1 tablet (20 mg total) by mouth daily at 6 PM. 02/05/20  Yes  Chrismon, Vickki Muff, PA-C  bismuth subsalicylate (PEPTO BISMOL) 262 MG/15ML suspension Take 30 mLs by mouth every 6 (six) hours as needed (for nausea, and vomiting.).   Yes [provider]  carvedilol (COREG) 12.5 MG tablet TAKE 1 TABLET(12.5 MG) BY MOUTH TWICE DAILY 02/05/20  Yes Chrismon, Vickki Muff, PA-C  chlorthalidone (HYGROTON) 25 MG tablet TAKE 1 TABLET(25 MG) BY MOUTH DAILY 05/14/20  Yes Wellington Hampshire, MD  enalapril (VASOTEC) 20 MG tablet TAKE 1 TABLET(20 MG) BY MOUTH DAILY 02/05/20  Yes Chrismon, Vickki Muff, PA-C  ibuprofen (ADVIL) 200 MG tablet Take 200 mg by mouth every 6 (six) hours as needed for moderate pain.   Yes [provider]  loperamide (IMODIUM A-D) 2 MG tablet Take 2 mg by mouth 4 (four) times daily as needed for diarrhea or loose stools.   Yes [provider]  nitroGLYCERIN (NITROSTAT) 0.4 MG SL tablet Place 0.4 mg under the tongue every 5 (five) minutes as needed.   Yes [provider]  omeprazole (PRILOSEC) 20 MG capsule TAKE 2 CAPSULES BY MOUTH EVERY DAY 12/29/20  Yes Chrismon, Vickki Muff, PA-C  silodosin (RAPAFLO) 8 MG CAPS capsule Take 1 capsule (8 mg total) by mouth daily with breakfast. Patient taking differently: Take 8 mg by mouth daily with breakfast. For 1 month to end 02-11-21. 01/14/21  Yes Stoioff,  Ronda Fairly, MD  lovastatin (ALTOPREV) 40 MG 24 hr tablet Take 80 mg by mouth at bedtime.    05/31/19  [provider]  simvastatin (ZOCOR) 40 MG tablet Take 40 mg by mouth at bedtime.    10/20/11  [provider]     ALLERGIES:  No Known Allergies   SOCIAL HISTORY:  Social History   Socioeconomic History   Marital status: Married    Spouse name: Not on file   Number of children: 1   Years of education: Not on file   Highest education level: Not on file  Occupational History   Not on file  Tobacco Use   Smoking status: Never   Smokeless tobacco: Never  Vaping Use   Vaping Use: Never used  Substance and Sexual  Activity   Alcohol use: No   Drug use: No   Sexual activity: Not on file  Other Topics Concern   Not on file  Social History Narrative   Not on file   Social Determinants of Health   Financial Resource Strain: Not on file  Food Insecurity: Not on file  Transportation Needs: Not on file  Physical Activity: Not on file  Stress: Not on file  Social Connections: Not on file  Intimate Partner Violence: Not on file      FAMILY HISTORY:  Family History  Family history unknown: Yes     REVIEW OF SYSTEMS:  Constitutional: denies weight loss, fever, chills, or sweats  Eyes: denies any other vision changes, history of eye injury  ENT: denies sore throat, hearing problems  Respiratory: denies shortness of breath, wheezing  Cardiovascular: denies chest pain, palpitations  Gastrointestinal: Positive abdominal pain, nausea and vomiting Genitourinary: denies burning with urination or urinary frequency Musculoskeletal: denies any other joint pains or cramps  Skin: denies any other rashes or skin discolorations  Neurological: denies any other headache, dizziness, weakness  Psychiatric: denies any other depression, anxiety   All other review of systems were negative   VITAL SIGNS:  Temp:  [98 F (36.7 C)-98.9 F (37.2 C)] 98.6 F (37 C) (06/27 1500) Pulse Rate:  [64-102] 99 (06/27 1500) Resp:  [16-30] 18 (06/27 1500) BP: (118-164)/(66-94) 122/83 (06/27 1500) SpO2:  [96 %-100 %] 97 % (06/27 1500) Weight:  [73.5 kg-74.8 kg] 73.5 kg (06/26 2319)     Height: 5\' 10"  (177.8 cm) Weight: 73.5 kg BMI (Calculated): 23.25   INTAKE/OUTPUT:  This shift: Total I/O In: 1016.5 [I.V.:8.3; IV Piggyback:1008.2] Out: -   Last 2 shifts: @IOLAST2SHIFTS @   PHYSICAL EXAM:  Constitutional:  -- Normal body habitus  -- Awake, alert, and oriented x3  Eyes:  -- Pupils equally round and reactive to light  -- No scleral icterus  Ear, nose, and throat:  -- No jugular venous distension  Pulmonary:   -- No crackles  -- Equal breath sounds bilaterally -- Breathing non-labored at rest Cardiovascular:  -- S1, S2 present  -- No pericardial rubs Gastrointestinal:  -- Abdomen soft, tender in the epigastric area, non-distended, no guarding or rebound tenderness -- No abdominal masses appreciated, pulsatile or otherwise  Musculoskeletal and Integumentary:  -- Wounds: None appreciated -- Extremities: B/L UE and LE FROM, hands and feet warm, no edema  Neurologic:  -- Motor function: intact and symmetric -- Sensation: intact and symmetric   Labs:  CBC Latest Ref Rng & Units 02/02/2021 02/01/2021 01/02/2021  WBC 4.0 - 10.5 K/uL 13.8(H) 13.6(H) 6.1  Hemoglobin 13.0 - 17.0 g/dL 15.0 14.5 13.5  Hematocrit 39.0 - 52.0 % 44.8 42.2 40.4  Platelets 150 - 400 K/uL 162 183 213   CMP Latest Ref Rng & Units 02/02/2021 02/02/2021 02/01/2021  Glucose 70 - 99 mg/dL 164(H) 117(H) 184(H)  BUN 8 - 23 mg/dL 40(H) 37(H) 31(H)  Creatinine 0.61 - 1.24 mg/dL 1.97(H) 1.93(H) 1.78(H)  Sodium 135 - 145 mmol/L 137 140 139  Potassium 3.5 - 5.1 mmol/L 3.8 3.7 4.1  Chloride 98 - 111 mmol/L 109 112(H) 108  CO2 22 - 32 mmol/L 20(L) 23 21(L)  Calcium 8.9 - 10.3 mg/dL 7.9(L) 8.2(L) 9.1  Total Protein 6.5 - 8.1 g/dL - 6.8 7.4  Total Bilirubin 0.3 - 1.2 mg/dL - 1.5(H) 1.7(H)  Alkaline Phos 38 - 126 U/L - 62 69  AST 15 - 41 U/L - 17 18  ALT 0 - 44 U/L - 11 13     Imaging studies:  EXAM: CTA ABDOMEN AND PELVIS WITHOUT AND WITH CONTRAST   TECHNIQUE: Multidetector CT imaging of the abdomen and pelvis was performed using the standard protocol during bolus administration of intravenous contrast. Multiplanar reconstructed images and MIPs were obtained and reviewed to evaluate the vascular anatomy.   CONTRAST:  64mL OMNIPAQUE IOHEXOL 350 MG/ML SOLN   COMPARISON:  None.   FINDINGS: VASCULAR   Aorta: Normal caliber aorta without aneurysm, dissection, vasculitis or significant stenosis.   Celiac: Patent without  evidence of aneurysm, dissection, vasculitis or significant stenosis.   SMA: Patent without evidence of aneurysm, dissection, vasculitis or significant stenosis.   Renals: Both renal arteries are patent without evidence of aneurysm, dissection, vasculitis, fibromuscular dysplasia or significant stenosis.   IMA: Patent without evidence of aneurysm, dissection, vasculitis or significant stenosis.   Inflow: Patent without evidence of aneurysm, dissection, vasculitis or significant stenosis.   Proximal Outflow: Bilateral common femoral and visualized portions of the superficial and profunda femoral arteries are patent without evidence of aneurysm, dissection, vasculitis or significant stenosis.   Veins: No obvious venous abnormality within the limitations of this arterial phase study.   Review of the MIP images confirms the above findings.   NON-VASCULAR   Lower chest: No acute abnormality.   Hepatobiliary: There is diffuse fatty infiltration of the liver parenchyma. No focal liver abnormality is seen. A 7 mm gallstone is seen within the gallbladder lumen, without evidence of gallbladder wall thickening or biliary dilatation.   Pancreas: Peripancreatic inflammatory fat stranding is seen. This also involves the adjacent duodenum and extends along the midline of the mid to lower abdomen. There is no evidence of pancreatic duct dilatation.   Spleen: Normal in size without focal abnormality.   Adrenals/Urinary Tract: Adrenal glands are unremarkable. Numerous bilateral simple renal cysts of various sizes are seen. There is no evidence of renal calculi or hydronephrosis. Bladder is unremarkable.   Stomach/Bowel: There is a small hiatal hernia. Appendix appears normal. No evidence of bowel dilatation. Marked severity, asymmetric thickening of the proximal and mid duodenum is seen. Associated Peri duodenal inflammatory fat stranding is noted. Noninflamed diverticula are seen  throughout the sigmoid colon.   Lymphatic: No abnormal abdominal or pelvic lymph nodes are identified.   Reproductive: Moderate to marked severity prostate gland enlargement is seen.   Other: A 3.0 cm x 2.2 cm fat containing right inguinal hernia is seen. A similar appearing 2.0 cm x 1.7 cm fat containing left inguinal hernia is noted. No abdominopelvic ascites.   Musculoskeletal: Multilevel degenerative changes are seen throughout the lumbar spine.   IMPRESSION: VASCULAR  No evidence of aneurysm, dissection, vasculitis or significant stenosis of the arterial structures within the abdomen or pelvis.   NON-VASCULAR   1. Marked severity enteritis involving the proximal and mid duodenum. 2. Findings consistent with adjacent acute pancreatitis cannot be excluded. Correlation with pancreatic enzymes is recommended. 3. Cholelithiasis. 4. Numerous bilateral simple renal cysts. 5. Small hiatal hernia. 6. Sigmoid diverticulosis.     Electronically Signed   By: Virgina Norfolk M.D.   On: 02/01/2021 19:07  Assessment/Plan:  85 y.o. male with gallstone pancreatitis, complicated by pertinent comorbidities including hypertension, coronary artery disease, aortic insufficiency.  Patient admitted with gastroenteritis with severe pain.  Patient reported that the pain has been slowly getting better.  He initially came with lipase of 1600s and the lipase today is 713.  The fact that the pain is improving and the lipase is decreasing are good signs of improvement of pancreatic inflammation.  I had a long discussion with the patient and the wife about the recommendation of having cholecystectomy before discharge to prevent recurrent episodes.  At this moment the patient will like to discuss with the family and his cardiologist regarding his risk of surgery versus his risk of recurrent pancreatitis.  I will continue to follow.  Agree with current management.   Arnold Long, MD

## 2021-02-03 ENCOUNTER — Inpatient Hospital Stay: Payer: Medicare Other

## 2021-02-03 ENCOUNTER — Other Ambulatory Visit: Payer: Medicare Other

## 2021-02-03 ENCOUNTER — Inpatient Hospital Stay (HOSPITAL_COMMUNITY)
Admission: EM | Admit: 2021-02-03 | Discharge: 2021-02-03 | Disposition: A | Discharge status: Home / Self Care | Payer: Medicare Other | Source: Home / Self Care | Attending: Physician Assistant | Admitting: Physician Assistant

## 2021-02-03 DIAGNOSIS — R0602 Shortness of breath: Secondary | ICD-10-CM

## 2021-02-03 DIAGNOSIS — I351 Nonrheumatic aortic (valve) insufficiency: Secondary | ICD-10-CM

## 2021-02-03 DIAGNOSIS — K851 Biliary acute pancreatitis without necrosis or infection: Principal | ICD-10-CM

## 2021-02-03 LAB — URINALYSIS, COMPLETE (UACMP) WITH MICROSCOPIC
Bilirubin Urine: NEGATIVE
Glucose, UA: NEGATIVE mg/dL
Ketones, ur: NEGATIVE mg/dL
Leukocytes,Ua: NEGATIVE
Nitrite: NEGATIVE
Protein, ur: 30 mg/dL — AB
RBC / HPF: >50 RBC/hpf — ABNORMAL HIGH (ref 0–5)
Specific Gravity, Urine: 1.008 (ref 1.005–1.030)
Squamous Epithelial / HPF: NONE SEEN (ref 0–5)
pH: 5 (ref 5.0–8.0)

## 2021-02-03 LAB — COMPREHENSIVE METABOLIC PANEL
ALT: 8 U/L (ref 0–44)
AST: 29 U/L (ref 15–41)
Albumin: 3 g/dL — ABNORMAL LOW (ref 3.5–5.0)
Alkaline Phosphatase: 56 U/L (ref 38–126)
Anion gap: 9 (ref 5–15)
BUN: 46 mg/dL — ABNORMAL HIGH (ref 8–23)
CO2: 24 mmol/L (ref 22–32)
Calcium: 8 mg/dL — ABNORMAL LOW (ref 8.9–10.3)
Chloride: 107 mmol/L (ref 98–111)
Creatinine, Ser: 2.23 mg/dL — ABNORMAL HIGH (ref 0.61–1.24)
GFR, Estimated: 29 mL/min — ABNORMAL LOW (ref >60)
Glucose, Bld: 103 mg/dL — ABNORMAL HIGH (ref 70–99)
Potassium: 3.6 mmol/L (ref 3.5–5.1)
Sodium: 140 mmol/L (ref 135–145)
Total Bilirubin: 1.7 mg/dL — ABNORMAL HIGH (ref 0.3–1.2)
Total Protein: 6.2 g/dL — ABNORMAL LOW (ref 6.5–8.1)

## 2021-02-03 LAB — ECHOCARDIOGRAM COMPLETE
AR max vel: 2.4 cm2
AV Area VTI: 2.29 cm2
AV Area mean vel: 2.45 cm2
AV Mean grad: 5 mmHg
AV Peak grad: 9.5 mmHg
Ao pk vel: 1.54 m/s
Area-P 1/2: 5.42 cm2
Height: 70 in
MV VTI: 2.89 cm2
P 1/2 time: 525 msec
S' Lateral: 3.5 cm
Weight: 2592.61 oz

## 2021-02-03 LAB — LIPASE, BLOOD: Lipase: 184 U/L — ABNORMAL HIGH (ref 11–51)

## 2021-02-03 MED ORDER — FUROSEMIDE 10 MG/ML IJ SOLN
40.0000 mg | Freq: Once | INTRAMUSCULAR | Status: AC
  Administered 2021-02-03: 11:00:00 40 mg via INTRAVENOUS
  Filled 2021-02-03: qty 4

## 2021-02-03 MED ORDER — SODIUM CHLORIDE 0.9 % IV SOLN
12.5000 mg | Freq: Three times a day (TID) | INTRAVENOUS | Status: DC | PRN
  Administered 2021-02-05 – 2021-02-06 (×2): 12.5 mg via INTRAVENOUS
  Filled 2021-02-03 (×2): qty 0.5

## 2021-02-03 MED ORDER — CARVEDILOL 3.125 MG PO TABS
3.1250 mg | ORAL_TABLET | Freq: Two times a day (BID) | ORAL | Status: DC
  Administered 2021-02-03 – 2021-02-10 (×15): 3.125 mg via ORAL
  Filled 2021-02-03 (×15): qty 1

## 2021-02-03 NOTE — Progress Notes (Addendum)
Patient ID: James Mora, male   DOB: May 26, 1936, 85 y.o.   MRN: [id number] [facility] PROGRESS NOTE  Purnell Daigle Belsito DVV:[id number] DOB: [birth year] DOA: 02/01/2021 PCP: [name], PA-C  HPI/Subjective: Patient feeling a little short of breath with walking to the bathroom.  Still having abdominal pain but a little bit less than yesterday.  Took a pain pill this morning.  Abdomen more tender right upper quadrant epigastric and left upper quadrant.  Admitted with acute pancreatitis  Objective: Vitals:   02/03/21 0745 02/03/21 1147  BP: 129/81 111/64  Pulse: 99 88  Resp: 16 16  Temp: 98 F (36.7 C) 97.9 F (36.6 C)  SpO2: 95% 94%    Intake/Output Summary (Last 24 hours) at 02/03/2021 1209 Last data filed at 02/03/2021 6269 Gross per 24 hour  Intake 3156.06 ml  Output 450 ml  Net 2706.06 ml   Filed Weights   02/01/21 1644 02/01/21 2319  Weight: 74.8 kg 73.5 kg    ROS: Review of Systems  Respiratory:  Positive for shortness of breath.   Cardiovascular:  Negative for chest pain.  Gastrointestinal:  Positive for abdominal pain. Negative for nausea and vomiting.  Exam: Physical Exam HENT:     Head: Normocephalic.     Mouth/Throat:     Pharynx: No oropharyngeal exudate.  Eyes:     General: Lids are normal.     Conjunctiva/sclera: Conjunctivae normal.  Cardiovascular:     Rate and Rhythm: Normal rate and regular rhythm.     Heart sounds: Normal heart sounds, S1 normal and S2 normal.  Pulmonary:     Breath sounds: Examination of the right-lower field reveals decreased breath sounds and rales. Examination of the left-lower field reveals decreased breath sounds and rales. Decreased breath sounds and rales present. No wheezing or rhonchi.  Abdominal:     General: There is distension.     Palpations: Abdomen is soft.     Tenderness: There is abdominal tenderness in the right upper quadrant, epigastric area and left upper quadrant.  Musculoskeletal:     Right lower  leg: No swelling.     Left lower leg: No swelling.  Skin:    General: Skin is warm.     Findings: No rash.  Neurological:     Mental Status: He is alert and oriented to person, place, and time.     Data Reviewed: Basic Metabolic Panel: Recent Labs  Lab 02/01/21 1658 02/02/21 0448 02/02/21 1510 02/03/21 0420  NA 139 140 137 140  K 4.1 3.7 3.8 3.6  CL 108 112* 109 107  CO2 21* 23 20* 24  GLUCOSE 184* 117* 164* 103*  BUN 31* 37* 40* 46*  CREATININE 1.78* 1.93* 1.97* 2.23*  CALCIUM 9.1 8.2* 7.9* 8.0*  MG 1.7  --   --   --    Liver Function Tests: Recent Labs  Lab 02/01/21 1658 02/02/21 0448 02/03/21 0420  AST 18 17 29   ALT 13 11 8   ALKPHOS 69 62 56  BILITOT 1.7* 1.5* 1.7*  PROT 7.4 6.8 6.2*  ALBUMIN 4.2 3.7 3.0*   Recent Labs  Lab 02/01/21 1658 02/02/21 0448 02/03/21 0420  LIPASE 1,666* 713* 184*   CBC: Recent Labs  Lab 02/01/21 1658 02/02/21 0448  WBC 13.6* 13.8*  HGB 14.5 15.0  HCT 42.2 44.8  MCV 91.7 91.1  PLT 183 162     Recent Results (from the past 240 hour(s))  Resp Panel by RT-PCR (Flu A&B, Covid) Nasopharyngeal Swab  Status: None   Collection Time: 02/01/21  5:54 PM   Specimen: Nasopharyngeal Swab; Nasopharyngeal(NP) swabs in vial transport medium  Result Value Ref Range Status   SARS Coronavirus 2 by RT PCR NEGATIVE NEGATIVE Final    Comment: (NOTE) SARS-CoV-2 target nucleic acids are NOT DETECTED.  The SARS-CoV-2 RNA is generally detectable in upper respiratory specimens during the acute phase of infection. The lowest concentration of SARS-CoV-2 viral copies this assay can detect is 138 copies/mL. A negative result does not preclude SARS-Cov-2 infection and should not be used as the sole basis for treatment or other patient management decisions. A negative result may occur with  improper specimen collection/handling, submission of specimen other than nasopharyngeal swab, presence of viral mutation(s) within the areas targeted by  this assay, and inadequate number of viral copies(<138 copies/mL). A negative result must be combined with clinical observations, patient history, and epidemiological information. The expected result is Negative.  Fact Sheet for Patients:  EntrepreneurPulse.com.au  Fact Sheet for Healthcare Providers:  IncredibleEmployment.be  This test is no t yet approved or cleared by the [place] FDA and  has been authorized for detection and/or diagnosis of SARS-CoV-2 by FDA under an Emergency Use Authorization (EUA). This EUA will remain  in effect (meaning this test can be used) for the duration of the COVID-19 declaration under Section 564(b)(1) of the Act, 21 U.S.C.section 360bbb-3(b)(1), unless the authorization is terminated  or revoked sooner.       Influenza A by PCR NEGATIVE NEGATIVE Final   Influenza B by PCR NEGATIVE NEGATIVE Final    Comment: (NOTE) The Xpert Xpress SARS-CoV-2/FLU/RSV plus assay is intended as an aid in the diagnosis of influenza from Nasopharyngeal swab specimens and should not be used as a sole basis for treatment. Nasal washings and aspirates are unacceptable for Xpert Xpress SARS-CoV-2/FLU/RSV testing.  Fact Sheet for Patients: EntrepreneurPulse.com.au  Fact Sheet for Healthcare Providers: IncredibleEmployment.be  This test is not yet approved or cleared by the [place] FDA and has been authorized for detection and/or diagnosis of SARS-CoV-2 by FDA under an Emergency Use Authorization (EUA). This EUA will remain in effect (meaning this test can be used) for the duration of the COVID-19 declaration under Section 564(b)(1) of the Act, 21 U.S.C. section 360bbb-3(b)(1), unless the authorization is terminated or revoked.  Performed at [facility], [address] [place], [place] [address]      Studies: DG Chest Portable 1 View  Result Date:  02/01/2021 CLINICAL DATA:  Epigastric pain. EXAM: PORTABLE CHEST 1 VIEW COMPARISON:  March 19, 2016 FINDINGS: Stable cardiomegaly. Stable atelectasis in the left base. No pneumothorax. No other interval changes or acute abnormalities. IMPRESSION: No active disease. Electronically Signed   By: Dorise Bullion III M.D   On: 02/01/2021 18:54   CT Angio Abd/Pel W and/or Wo Contrast  Result Date: 02/01/2021 CLINICAL DATA:  Abdominal pain and emesis. EXAM: CTA ABDOMEN AND PELVIS WITHOUT AND WITH CONTRAST TECHNIQUE: Multidetector CT imaging of the abdomen and pelvis was performed using the standard protocol during bolus administration of intravenous contrast. Multiplanar reconstructed images and MIPs were obtained and reviewed to evaluate the vascular anatomy. CONTRAST:  71mL OMNIPAQUE IOHEXOL 350 MG/ML SOLN COMPARISON:  None. FINDINGS: VASCULAR Aorta: Normal caliber aorta without aneurysm, dissection, vasculitis or significant stenosis. Celiac: Patent without evidence of aneurysm, dissection, vasculitis or significant stenosis. SMA: Patent without evidence of aneurysm, dissection, vasculitis or significant stenosis. Renals: Both renal arteries are patent without evidence of aneurysm, dissection, vasculitis,  fibromuscular dysplasia or significant stenosis. IMA: Patent without evidence of aneurysm, dissection, vasculitis or significant stenosis. Inflow: Patent without evidence of aneurysm, dissection, vasculitis or significant stenosis. Proximal Outflow: Bilateral common femoral and visualized portions of the superficial and profunda femoral arteries are patent without evidence of aneurysm, dissection, vasculitis or significant stenosis. Veins: No obvious venous abnormality within the limitations of this arterial phase study. Review of the MIP images confirms the above findings. NON-VASCULAR Lower chest: No acute abnormality. Hepatobiliary: There is diffuse fatty infiltration of the liver parenchyma. No focal liver  abnormality is seen. A 7 mm gallstone is seen within the gallbladder lumen, without evidence of gallbladder wall thickening or biliary dilatation. Pancreas: Peripancreatic inflammatory fat stranding is seen. This also involves the adjacent duodenum and extends along the midline of the mid to lower abdomen. There is no evidence of pancreatic duct dilatation. Spleen: Normal in size without focal abnormality. Adrenals/Urinary Tract: Adrenal glands are unremarkable. Numerous bilateral simple renal cysts of various sizes are seen. There is no evidence of renal calculi or hydronephrosis. Bladder is unremarkable. Stomach/Bowel: There is a small hiatal hernia. Appendix appears normal. No evidence of bowel dilatation. Marked severity, asymmetric thickening of the proximal and mid duodenum is seen. Associated Peri duodenal inflammatory fat stranding is noted. Noninflamed diverticula are seen throughout the sigmoid colon. Lymphatic: No abnormal abdominal or pelvic lymph nodes are identified. Reproductive: Moderate to marked severity prostate gland enlargement is seen. Other: A 3.0 cm x 2.2 cm fat containing right inguinal hernia is seen. A similar appearing 2.0 cm x 1.7 cm fat containing left inguinal hernia is noted. No abdominopelvic ascites. Musculoskeletal: Multilevel degenerative changes are seen throughout the lumbar spine. IMPRESSION: VASCULAR No evidence of aneurysm, dissection, vasculitis or significant stenosis of the arterial structures within the abdomen or pelvis. NON-VASCULAR 1. Marked severity enteritis involving the proximal and mid duodenum. 2. Findings consistent with adjacent acute pancreatitis cannot be excluded. Correlation with pancreatic enzymes is recommended. 3. Cholelithiasis. 4. Numerous bilateral simple renal cysts. 5. Small hiatal hernia. 6. Sigmoid diverticulosis. Electronically Signed   By: Virgina Norfolk M.D.   On: 02/01/2021 19:07   US Abdomen Limited RUQ (LIVER/GB)  Result Date:  02/03/2021 CLINICAL DATA:  Gallstone. Pancreatitis. Evaluate common bile duct. EXAM: ULTRASOUND ABDOMEN LIMITED RIGHT UPPER QUADRANT COMPARISON:  CT 02/01/2021 FINDINGS: Gallbladder: Borderline minimal gallbladder wall thickening. Single visible gallstone measuring 1 cm in diameter. Small amount of gallbladder sludge. Patient is somewhat tender in this area, difficult to interpret as a Murphy sign given the presence of pancreatitis. Common bile duct: Diameter: 3 mm. Normal. No evidence of dilatation or choledocholithiasis. Liver: 1.5 cm hypoechoic area in the medial right lobe probably represents a venous varix based on the previous CT. This is not clinically significant. Liver parenchyma otherwise is normal. Portal vein is patent on color Doppler imaging with normal direction of blood flow towards the liver. Other: Small amount of adjacent ascitic fluid. IMPRESSION: 1 cm gallstone. Mild gallbladder wall thickening. Common duct is normal. Patient is tender in the region. This may be due to the pancreatitis shown by CT. Cannot rule out a true [place] sign, though that is felt less likely. Consider correlation with nuclear medicine hepatobiliary scan if concern persists regarding the possibility of cholecystitis. Electronically Signed   By: Nelson Chimes M.D.   On: 02/03/2021 11:23    Scheduled Meds:  atorvastatin  20 mg Oral q1800   carvedilol  12.5 mg Oral BID WC   enoxaparin (LOVENOX) injection  40 mg Subcutaneous Q24H   pantoprazole (PROTONIX) IV  40 mg Intravenous Q12H   tamsulosin  0.4 mg Oral QPC breakfast   Continuous Infusions:  lactated ringers 50 mL/hr at 02/03/21 1118   Brief history.  Patient admitted 02/01/2021 with nausea vomiting abdominal pain and diarrhea.  He was diagnosed with acute pancreatitis with gallstones and enteritis seen on CT scan.  Patient was started on PPI therapy.  General surgery and gastroenterology consulted.  Lipase has trended better and general surgery will talk to the  patient about removing the gallbladder.  Past medical history coronary artery disease, hyperlipidemia, essential hypertension, GERD and BPH.  Assessment/Plan:  Acute pancreatitis with gallstones.  Patient given a fluid bolus yesterday and increased IV fluids up to 150 mL/h.  Patient's lipase has come down from 1666 down to 184.  General surgery okay with not repeating lipase anymore.  Dr. Windell Moment will speak with the patient about cholecystectomy.  Continue to hold chlorthalidone.  Triglycerides 68.  Patient does not drink alcohol.  Full liquid diet today.  Ultrasound right upper quadrant does show a gallstone.  CT scan does show inflammation around the pancreas and enteritis. Shortness of breath with ambulation.  We will give 1 dose of Lasix and drop rate of IV fluids down. Acute kidney injury on chronic kidney disease stage IIIa.  Creatinine 2.23 today.  On presentation 1.78 baseline around 1.64. Acute enteritis seen on CT scan.  Could be inflammation with acute pancreatitis.  Continue IV PPI therapy. History of CAD and hyperlipidemia.  Continue Coreg and atorvastatin.  General surgery consulted cardiology and echocardiogram was ordered but results are still pending at the time of this dictation. Essential hypertension on Coreg and enalapril GERD on IV PPI therapy BPH on Flomax    Code Status:     Code Status Orders  (From admission, onward)           Start     Ordered   02/01/21 2123  Full code  Continuous        02/01/21 2129           Code Status History     Date Active Date Inactive Code Status Order ID Comments User Context   03/19/2016 2202 03/20/2016 0751 Full Code [id number]  Henreitta Leber, MD Inpatient      Family Communication: Spoke with wife at the bedside Disposition Plan: Status is: Inpatient  Dispo: The patient is from: Home              Anticipated d/c is to: Home              Patient currently being treated for acute pancreatitis and will have  gallbladder removal prior to disposition.   Difficult to place patient.  No.  Consultants: General surgery Gastroenterology Cardiology  Time spent: 27 minutes  [place]

## 2021-02-03 NOTE — Progress Notes (Signed)
GI Inpatient Follow-up Note  Subjective:  Patient seen and doing a little better pain wise. Kidney function a little worse today.  Scheduled Inpatient Medications:   atorvastatin  20 mg Oral q1800   carvedilol  3.125 mg Oral BID WC   enoxaparin (LOVENOX) injection  40 mg Subcutaneous Q24H   pantoprazole (PROTONIX) IV  40 mg Intravenous Q12H   tamsulosin  0.4 mg Oral QPC breakfast    Continuous Inpatient Infusions:    lactated ringers 50 mL/hr at 02/03/21 1118    PRN Inpatient Medications:  acetaminophen **OR** acetaminophen, magnesium hydroxide, morphine injection, nitroGLYCERIN, ondansetron **OR** ondansetron (ZOFRAN) IV, oxyCODONE, traZODone  Review of Systems:  Review of Systems  Constitutional:  Negative for chills and fever.  Respiratory:  Negative for cough.   Gastrointestinal:  Positive for abdominal pain.  Genitourinary:  Positive for hematuria.  Skin:  Negative for itching and rash.  Neurological:  Negative for focal weakness.  Psychiatric/Behavioral:  Negative for substance abuse.   All other systems reviewed and are negative.    Physical Examination: BP 92/64 (BP Location: Left Arm)   Pulse 93   Temp 98.1 F (36.7 C)   Resp 20   Ht 5\' 10"  (1.778 m)   Wt 73.5 kg   SpO2 95%   BMI 23.25 kg/m  Gen: NAD, alert and oriented x 4 HEENT: PEERLA, EOMI, Neck: supple, no JVD or thyromegaly Chest: No respiratory distress CV: RRR Abd: soft, non-tender, non-distended Ext: no edema, well perfused with 2+ pulses, Skin: no rash or lesions noted Lymph: no LAD  Data: Lab Results  Component Value Date   WBC 13.8 (H) 02/02/2021   HGB 15.0 02/02/2021   HCT 44.8 02/02/2021   MCV 91.1 02/02/2021   PLT 162 02/02/2021   Recent Labs  Lab 02/01/21 1658 02/02/21 0448  HGB 14.5 15.0   Lab Results  Component Value Date   NA 140 02/03/2021   K 3.6 02/03/2021   CL 107 02/03/2021   CO2 24 02/03/2021   BUN 46 (H) 02/03/2021   CREATININE 2.23 (H) 02/03/2021   Lab  Results  Component Value Date   ALT 8 02/03/2021   AST 29 02/03/2021   ALKPHOS 56 02/03/2021   BILITOT 1.7 (H) 02/03/2021   No results for input(s): APTT, INR, PTT in the last 168 hours. Assessment/Plan: 85 y/o gentleman with acute pancreatitis likely secondary to gallstones. Mild elevated T. Bili. RUQ without any ductal dilation.  Recommendations:   - pain control - advanced diet as tolerated - IVF as able as his BUN continues to increase - cholecystectomy per surgery   Please call with any questions or concerns.   Raylene Miyamoto MD, MPH [place]

## 2021-02-03 NOTE — Progress Notes (Signed)
Patient ID: James Mora, male   DOB: January 14, 1936, 85 y.o.   MRN: [id number]     [facility] Day(s): 2.   Interval History: Patient seen and examined, no acute events or new complaints overnight. Patient reports feeling better today.  He reports that the abdominal pain continues to improve.  He has been tolerating clear liquids.  Lipase continue to trend down.  Vital signs in last 24 hours: [min-max] current  Temp:  [97.9 F (36.6 C)-99.1 F (37.3 C)] 97.9 F (36.6 C) (06/28 1147) Pulse Rate:  [88-100] 88 (06/28 1147) Resp:  [16-20] 16 (06/28 1147) BP: (111-129)/(64-83) 111/64 (06/28 1147) SpO2:  [93 %-98 %] 94 % (06/28 1147)     Height: 5\' 10"  (177.8 cm) Weight: 73.5 kg BMI (Calculated): 23.25   Physical Exam:  Constitutional: alert, cooperative and no distress  Respiratory: breathing non-labored at rest  Cardiovascular: regular rate and sinus rhythm  Gastrointestinal: soft, non-tender, and non-distended   Imaging studies: No new pertinent imaging studies   Assessment/Plan:  85 y.o. male with gallstone pancreatitis, complicated by pertinent comorbidities including hypertension, coronary artery disease, aortic insufficiency.  Patient continues to respond and improve clinically.  Pain continue to improve.  Lipase continue trending down.  We discussed again about recommendation of cholecystectomy before discharge.  Patient seems to be in agreement with this plan.  We will continue to follow.  Arnold Long, MD

## 2021-02-03 NOTE — Progress Notes (Signed)
*  PRELIMINARY RESULTS* Echocardiogram 2D Echocardiogram has been performed.  James Mora 02/03/2021, 8:55 AM

## 2021-02-04 DIAGNOSIS — I2510 Atherosclerotic heart disease of native coronary artery without angina pectoris: Secondary | ICD-10-CM

## 2021-02-04 LAB — HEPATIC FUNCTION PANEL
ALT: 8 U/L (ref 0–44)
AST: 18 U/L (ref 15–41)
Albumin: 2.7 g/dL — ABNORMAL LOW (ref 3.5–5.0)
Alkaline Phosphatase: 50 U/L (ref 38–126)
Bilirubin, Direct: 0.3 mg/dL — ABNORMAL HIGH (ref 0.0–0.2)
Indirect Bilirubin: 1.4 mg/dL — ABNORMAL HIGH (ref 0.3–0.9)
Total Bilirubin: 1.7 mg/dL — ABNORMAL HIGH (ref 0.3–1.2)
Total Protein: 5.9 g/dL — ABNORMAL LOW (ref 6.5–8.1)

## 2021-02-04 LAB — BASIC METABOLIC PANEL
Anion gap: 9 (ref 5–15)
BUN: 50 mg/dL — ABNORMAL HIGH (ref 8–23)
CO2: 25 mmol/L (ref 22–32)
Calcium: 7.8 mg/dL — ABNORMAL LOW (ref 8.9–10.3)
Chloride: 104 mmol/L (ref 98–111)
Creatinine, Ser: 2.21 mg/dL — ABNORMAL HIGH (ref 0.61–1.24)
GFR, Estimated: 29 mL/min — ABNORMAL LOW (ref >60)
Glucose, Bld: 95 mg/dL (ref 70–99)
Potassium: 3.5 mmol/L (ref 3.5–5.1)
Sodium: 138 mmol/L (ref 135–145)

## 2021-02-04 LAB — CBC WITH DIFFERENTIAL/PLATELET
Abs Immature Granulocytes: 0.11 10*3/uL — ABNORMAL HIGH (ref 0.00–0.07)
Basophils Absolute: 0 10*3/uL (ref 0.0–0.1)
Basophils Relative: 0 %
Eosinophils Absolute: 0 10*3/uL (ref 0.0–0.5)
Eosinophils Relative: 0 %
HCT: 35.8 % — ABNORMAL LOW (ref 39.0–52.0)
Hemoglobin: 11.7 g/dL — ABNORMAL LOW (ref 13.0–17.0)
Immature Granulocytes: 1 %
Lymphocytes Relative: 5 %
Lymphs Abs: 0.5 10*3/uL — ABNORMAL LOW (ref 0.7–4.0)
MCH: 30.5 pg (ref 26.0–34.0)
MCHC: 32.7 g/dL (ref 30.0–36.0)
MCV: 93.2 fL (ref 80.0–100.0)
Monocytes Absolute: 0.7 10*3/uL (ref 0.1–1.0)
Monocytes Relative: 6 %
Neutro Abs: 10.8 10*3/uL — ABNORMAL HIGH (ref 1.7–7.7)
Neutrophils Relative %: 88 %
Platelets: 120 10*3/uL — ABNORMAL LOW (ref 150–400)
RBC: 3.84 MIL/uL — ABNORMAL LOW (ref 4.22–5.81)
RDW: 14 % (ref 11.5–15.5)
WBC: 12.1 10*3/uL — ABNORMAL HIGH (ref 4.0–10.5)
nRBC: 0 % (ref 0.0–0.2)

## 2021-02-04 LAB — TROPONIN I (HIGH SENSITIVITY): Troponin I (High Sensitivity): 21 ng/L — ABNORMAL HIGH (ref <18)

## 2021-02-04 LAB — LIPASE, BLOOD: Lipase: 60 U/L — ABNORMAL HIGH (ref 11–51)

## 2021-02-04 NOTE — Progress Notes (Signed)
Patient ID: James Mora, male   DOB: 11/24/1935, 85 y.o.   MRN: [id number]     [facility] Day(s): 3.   Interval History: Patient seen and examined this morning when he was with severe abdominal pain.  Pain localized to the epigastric area.  Pain radiated to both upper quadrants.  Patient was nauseous.  He cannot identify an aggravating factor.  Patient was NPO.  Vital signs in last 24 hours: [min-max] current  Temp:  [98.1 F (36.7 C)-99.8 F (37.7 C)] 99 F (37.2 C) (06/29 1247) Pulse Rate:  [93-99] 99 (06/29 1247) Resp:  [16-23] 23 (06/29 1247) BP: (92-145)/(64-83) 121/75 (06/29 1247) SpO2:  [90 %-95 %] 93 % (06/29 1247)     Height: 5\' 10"  (177.8 cm) Weight: 73.5 kg BMI (Calculated): 23.25   Physical Exam:  Constitutional: alert, cooperative and no distress  Respiratory: breathing non-labored at rest  Cardiovascular: regular rate and sinus rhythm  Gastrointestinal: soft, tender in the epigastric area, and non-distended   Imaging studies: No new pertinent imaging studies   Assessment/Plan:  85 y.o. male with gallstone pancreatitis, complicated by pertinent comorbidities including hypertension, coronary artery disease, aortic insufficiency.  Patient with recurrent severe abdominal pain this morning.  Due to concern of possible recurrent pancreatitis versus cardiac etiology surgery was canceled.  Complete work-up done by hospitalist without any concerning cardiac complication.  Lipase continue trending down.  Patient was reexamined in the afternoon and he was found without pain.  No nausea.  Patient will be started on clear liquids.  We will see if patient tolerates clear liquids for another 24 hours to reconsider cholecystectomy before discharge.  Continue IV hydration for acute kidney injury.  Continue current management for pain control.  We will continue to follow.  Arnold Long, MD

## 2021-02-04 NOTE — TOC Initial Note (Signed)
Transition of Care [facility]) - Initial/Assessment Note    Patient Details  Name: James Mora MRN: [id number] Date of Birth: 04-09-1936  Transition of Care [facility]) CM/SW Contact:    Shelbie Hutching, RN Phone Number: 02/04/2021, 1:39 PM  Clinical Narrative:                 Patient admitted to the hospital with acute pancreatitis.  RNCM met with patient and patient's wife at the bedside.  Patient will have cholecystectomy before discharging home.  Procedure was scheduled for today but patient having more pain to cholecystectomy postponed.  Patient is from home with wife.  He is independent in ADL's, he does have a cane and walker if needed.  Patient drives. No TOC needs identified at this time.    Expected Discharge Plan: Home/Self Care Barriers to Discharge: Continued Medical Work up   Patient Goals and CMS Choice Patient states their goals for this hospitalization and ongoing recovery are:: Patient wants his abdominal pain to go away      Expected Discharge Plan and Services Expected Discharge Plan: Home/Self Care       Living arrangements for the past 2 months: Single Family Home                 DME Arranged: N/A DME Agency: NA       HH Arranged: NA [place] Agency: NA        Prior Living Arrangements/Services Living arrangements for the past 2 months: Single Family Home Lives with:: Spouse Patient language and need for interpreter reviewed:: Yes Do you feel safe going back to the place where you live?: Yes      Need for Family Participation in Patient Care: Yes (Comment) (pancreatitis) Care giver support system in place?: Yes (comment) (wife) Current home services: DME (cane and walker) Criminal Activity/Legal Involvement Pertinent to Current Situation/Hospitalization: No - Comment as needed  Activities of Daily Living Home Assistive Devices/Equipment: Walker (specify type) ADL Screening (condition at time of admission) Patient's cognitive ability adequate to safely  complete daily activities?: Yes Is the patient deaf or have difficulty hearing?: No Does the patient have difficulty seeing, even when wearing glasses/contacts?: No Does the patient have difficulty concentrating, remembering, or making decisions?: No Patient able to express need for assistance with ADLs?: Yes Does the patient have difficulty dressing or bathing?: No Independently performs ADLs?: Yes (appropriate for developmental age) Does the patient have difficulty walking or climbing stairs?: No Weakness of Legs: Left Weakness of Arms/Hands: None  Permission Sought/Granted Permission sought to share information with : Case Manager, Family Supports Permission granted to share information with : Yes, Verbal Permission Granted  Share Information with NAME: Lesleigh Noe     Permission granted to share info w Relationship: wife     Emotional Assessment Appearance:: Appears stated age Attitude/Demeanor/Rapport: Engaged Affect (typically observed): Accepting Orientation: : Oriented to Self, Oriented to Place, Oriented to  Time, Oriented to Situation Alcohol / Substance Use: Not Applicable Psych Involvement: No (comment)  Admission diagnosis:  Acute pancreatitis [K85.90] Acute pancreatitis, unspecified complication status, unspecified pancreatitis type [K85.90] Patient Active Problem List   Diagnosis Date Noted   Gallstone pancreatitis    Shortness of breath    Gallstones    Enteritis    Acute pancreatitis 02/01/2021   Narrowing of intervertebral disc space 06/18/2016   Elevated prostate specific antigen (PSA) 06/18/2016   Benign prostatic hyperplasia without lower urinary tract symptoms 06/18/2016   HLD (hyperlipidemia)    Mixed  hyperlipidemia    CAD (coronary artery disease)    Coronary artery disease involving native coronary artery of native heart with angina pectoris with documented spasm (HCC)    NSTEMI (non-ST elevated myocardial infarction) ([place]) 03/19/2016   HTN  (hypertension)    High blood cholesterol level    Acid reflux    Hyperlipidemia 12/27/2008   Essential hypertension 12/27/2008   Coronary atherosclerosis 12/27/2008   Aortic valve disorder 12/27/2008   Esophagitis, reflux 06/06/2006   Hypertensive heart disease 08/10/2003   PCP:  [name], PA-C Pharmacy:   [id number] [address]on, [place] AT [place] [address] [place] [address] Phone: [phone] Fax: [phone]     Social Determinants of Health (SDOH) Interventions    Readmission Risk Interventions No flowsheet data found.

## 2021-02-04 NOTE — Progress Notes (Addendum)
PROGRESS NOTE    James Mora  JFH:[id number] DOB: Feb 15, 1936 DOA: 02/01/2021 PCP: [name], PA-C   Brief Narrative:  Patient admitted 02/01/2021 with nausea vomiting abdominal pain and diarrhea.  He was diagnosed with acute pancreatitis with gallstones and enteritis seen on CT scan.  Patient was started on PPI therapy.  General surgery and gastroenterology consulted.  Lipase has trended better and general surgery will talk to the patient about removing the gallbladder.  Patient was initially scheduled for cholecystectomy on 6/29.  However in the morning he was complaining of acutely worsened abdominal pain.  Unclear etiology.  Lipase and troponin both reassuring.  Discussed with general surgery.  Cholecystectomy on hold for now pending improvement in pain control.   Assessment & Plan:   Active Problems:   Benign prostatic hyperplasia without lower urinary tract symptoms   Acute pancreatitis   Gallstones   Enteritis   Gallstone pancreatitis   Shortness of breath  Acute gallstone pancreatitis Lipase trending down Patient had recurrence of pain on 6/29 Unclear etiology, possibly pancreatitis versus passed stone Plan: Surgery on hold Ensure pain control Continue IV fluids Serial abdominal exams Clear liquids for today, advance as tolerated  Acute kidney injury on chronic kidney disease stage IIIa Baseline creatinine 1.64 Creatinine peaked at 2.23, now downtrending Continue IV fluids as above  Acute enteritis on CAT scan Suspect secondary inflammation from acute pancreatitis Continue IV PPI and pain control  History of coronary disease Hyperlipidemia Echocardiogram reassuring No further inpatient cardiac diagnostics Plan: Continue Coreg and atorvastatin Continue telemetry monitoring  Essential hypertension Well-controlled over interval Continue Coreg and enalapril  GERD PPI  BPH Flomax   DVT prophylaxis: SCDs Code Status: Full Family  Communication: Wife at bedside Disposition Plan: Status is: Inpatient  Remains inpatient appropriate because:Inpatient level of care appropriate due to severity of illness  Dispo: The patient is from: Home              Anticipated d/c is to: Home              Patient currently is not medically stable to d/c.   Difficult to place patient No  Acute also pancreatitis.  Slowly recovering.  Breakthrough pain today preventing elective cholecystectomy.  Will follow-up with general surgery upon pain control.     Level of care: Med-Surg  Consultants:  General surgery Cardiology  Procedures: (Don't include imaging studies which can be auto populated. Include things that cannot be auto populated i.e. Echo, Carotid and venous dopplers, Foley, Bipap, HD, tubes/drains, wound vac, central lines etc) None  Antimicrobials: (specify start and planned stop date. Auto populated tables are space occupying and do not give end dates) None   Subjective: Seen and examined.  Reports of bandlike lower abdominal pain with radiation to left arm.  Objective: Vitals:   02/03/21 2322 02/04/21 0452 02/04/21 0734 02/04/21 1247  BP: 117/69 (!) 145/82 (!) 143/83 121/75  Pulse: 98 98 98 99  Resp: 16 20 (!) 22 (!) 23  Temp: 99.8 F (37.7 C) 99.1 F (37.3 C) 98.8 F (37.1 C) 99 F (37.2 C)  TempSrc:   Oral Oral  SpO2: 91% 90% 95% 93%  Weight:      Height:        Intake/Output Summary (Last 24 hours) at 02/04/2021 1315 Last data filed at 02/04/2021 1303 Gross per 24 hour  Intake 2397.05 ml  Output 1150 ml  Net 1247.05 ml   Filed Weights   02/01/21 1644 02/01/21 2319  Weight: 74.8 kg 73.5 kg    Examination:  General exam: Mild distress due to pain Respiratory system: Clear to auscultation. Respiratory effort normal. Cardiovascular system: S1 & S2 heard, RRR. No JVD, murmurs, rubs, gallops or clicks. No pedal edema. Gastrointestinal system: Soft, nondistended, tender to palpation epigastrium and  left upper quadrant. Central nervous system: Alert and oriented. No focal neurological deficits. Extremities: Symmetric 5 x 5 power. Skin: No rashes, lesions or ulcers Psychiatry: Judgement and insight appear normal. Mood & affect appropriate.     Data Reviewed: I have personally reviewed following labs and imaging studies  CBC: Recent Labs  Lab 02/01/21 1658 02/02/21 0448 02/04/21 0445  WBC 13.6* 13.8* 12.1*  NEUTROABS  --   --  10.8*  HGB 14.5 15.0 11.7*  HCT 42.2 44.8 35.8*  MCV 91.7 91.1 93.2  PLT 183 162 878*   Basic Metabolic Panel: Recent Labs  Lab 02/01/21 1658 02/02/21 0448 02/02/21 1510 02/03/21 0420 02/04/21 0445  NA 139 140 137 140 138  K 4.1 3.7 3.8 3.6 3.5  CL 108 112* 109 107 104  CO2 21* 23 20* 24 25  GLUCOSE 184* 117* 164* 103* 95  BUN 31* 37* 40* 46* 50*  CREATININE 1.78* 1.93* 1.97* 2.23* 2.21*  CALCIUM 9.1 8.2* 7.9* 8.0* 7.8*  MG 1.7  --   --   --   --    GFR: Estimated Creatinine Clearance: 26.6 mL/min (A) (by C-G formula based on SCr of 2.21 mg/dL (H)). Liver Function Tests: Recent Labs  Lab 02/01/21 1658 02/02/21 0448 02/03/21 0420 02/04/21 0445  AST 18 17 29 18   ALT 13 11 8 8   ALKPHOS 69 62 56 50  BILITOT 1.7* 1.5* 1.7* 1.7*  PROT 7.4 6.8 6.2* 5.9*  ALBUMIN 4.2 3.7 3.0* 2.7*   Recent Labs  Lab 02/01/21 1658 02/02/21 0448 02/03/21 0420 02/04/21 0445  LIPASE 1,666* 713* 184* 60*   No results for input(s): AMMONIA in the last 168 hours. Coagulation Profile: No results for input(s): INR, PROTIME in the last 168 hours. Cardiac Enzymes: No results for input(s): CKTOTAL, CKMB, CKMBINDEX, TROPONINI in the last 168 hours. BNP (last 3 results) No results for input(s): PROBNP in the last 8760 hours. HbA1C: No results for input(s): HGBA1C in the last 72 hours. CBG: No results for input(s): GLUCAP in the last 168 hours. Lipid Profile: Recent Labs    02/01/21 1849  CHOL 125  HDL 49  LDLCALC 62  TRIG 68  CHOLHDL 2.6    Thyroid Function Tests: No results for input(s): TSH, T4TOTAL, FREET4, T3FREE, THYROIDAB in the last 72 hours. Anemia Panel: No results for input(s): VITAMINB12, FOLATE, FERRITIN, TIBC, IRON, RETICCTPCT in the last 72 hours. Sepsis Labs: No results for input(s): PROCALCITON, LATICACIDVEN in the last 168 hours.  Recent Results (from the past 240 hour(s))  Resp Panel by RT-PCR (Flu A&B, Covid) Nasopharyngeal Swab     Status: None   Collection Time: 02/01/21  5:54 PM   Specimen: Nasopharyngeal Swab; Nasopharyngeal(NP) swabs in vial transport medium  Result Value Ref Range Status   SARS Coronavirus 2 by RT PCR NEGATIVE NEGATIVE Final    Comment: (NOTE) SARS-CoV-2 target nucleic acids are NOT DETECTED.  The SARS-CoV-2 RNA is generally detectable in upper respiratory specimens during the acute phase of infection. The lowest concentration of SARS-CoV-2 viral copies this assay can detect is 138 copies/mL. A negative result does not preclude SARS-Cov-2 infection and should not be used as the sole basis for treatment  or other patient management decisions. A negative result may occur with  improper specimen collection/handling, submission of specimen other than nasopharyngeal swab, presence of viral mutation(s) within the areas targeted by this assay, and inadequate number of viral copies(<138 copies/mL). A negative result must be combined with clinical observations, patient history, and epidemiological information. The expected result is Negative.  Fact Sheet for Patients:  EntrepreneurPulse.com.au  Fact Sheet for Healthcare Providers:  IncredibleEmployment.be  This test is no t yet approved or cleared by the [place] FDA and  has been authorized for detection and/or diagnosis of SARS-CoV-2 by FDA under an Emergency Use Authorization (EUA). This EUA will remain  in effect (meaning this test can be used) for the duration of the COVID-19  declaration under Section 564(b)(1) of the Act, 21 U.S.C.section 360bbb-3(b)(1), unless the authorization is terminated  or revoked sooner.       Influenza A by PCR NEGATIVE NEGATIVE Final   Influenza B by PCR NEGATIVE NEGATIVE Final    Comment: (NOTE) The Xpert Xpress SARS-CoV-2/FLU/RSV plus assay is intended as an aid in the diagnosis of influenza from Nasopharyngeal swab specimens and should not be used as a sole basis for treatment. Nasal washings and aspirates are unacceptable for Xpert Xpress SARS-CoV-2/FLU/RSV testing.  Fact Sheet for Patients: EntrepreneurPulse.com.au  Fact Sheet for Healthcare Providers: IncredibleEmployment.be  This test is not yet approved or cleared by the [place] FDA and has been authorized for detection and/or diagnosis of SARS-CoV-2 by FDA under an Emergency Use Authorization (EUA). This EUA will remain in effect (meaning this test can be used) for the duration of the COVID-19 declaration under Section 564(b)(1) of the Act, 21 U.S.C. section 360bbb-3(b)(1), unless the authorization is terminated or revoked.  Performed at [facility], [address], [place], [place] [address]          Radiology Studies: ECHOCARDIOGRAM COMPLETE  Result Date: 02/03/2021    ECHOCARDIOGRAM REPORT   Patient Name:   Doneta Public Brindley Date of Exam: 02/03/2021 Medical Rec #:  [id number]    Height:       70.0 in Accession #:    [id number]   Weight:       162.0 lb Date of Birth:  Sep 13, 1935    BSA:          1.909 m Patient Age:    85 years     BP:           129/81 mmHg Patient Gender: M            HR:           101 bpm. Exam Location:  [facility] Procedure: 2D Echo, Color Doppler and Cardiac Doppler Indications:     I35.1 Aortic regurgitation  History:         Patient has prior history of Echocardiogram examinations. CAD;                  Risk Factors:Hypertension and Dyslipidemia.  Sonographer:     Charmayne Sheer RDCS (AE) Referring  Phys:  [id number] Rise Mu Diagnosing Phys: Nelva Bush MD  Sonographer Comments: Suboptimal parasternal window and no subcostal window. Image acquisition challenging due to respiratory motion. IMPRESSIONS  1. Left ventricular ejection fraction, by estimation, is 70 to 75%. The left ventricle has hyperdynamic function. The left ventricle has no regional wall motion abnormalities. Left ventricular diastolic parameters were normal.  2. Right ventricular systolic function is normal. The right ventricular size is mildly enlarged. Tricuspid regurgitation  signal is inadequate for assessing PA pressure.  3. The mitral valve is grossly normal. No evidence of mitral valve regurgitation. No evidence of mitral stenosis.  4. The aortic valve is tricuspid. Aortic valve regurgitation is mild. Mild aortic valve sclerosis is present, with no evidence of aortic valve stenosis.  5. Aortic dilatation noted. There is mild dilatation of the aortic root, measuring 41 mm. FINDINGS  Left Ventricle: Left ventricular ejection fraction, by estimation, is 70 to 75%. The left ventricle has hyperdynamic function. The left ventricle has no regional wall motion abnormalities. The left ventricular internal cavity size was normal in size. There is borderline left ventricular hypertrophy. Left ventricular diastolic parameters were normal. Right Ventricle: The right ventricular size is mildly enlarged. No increase in right ventricular wall thickness. Right ventricular systolic function is normal. Tricuspid regurgitation signal is inadequate for assessing PA pressure. Left Atrium: Left atrial size was normal in size. Right Atrium: Right atrial size was not well visualized. Pericardium: The pericardium was not well visualized. Mitral Valve: The mitral valve is grossly normal. No evidence of mitral valve regurgitation. No evidence of mitral valve stenosis. MV peak gradient, 2.8 mmHg. The mean mitral valve gradient is 1.0 mmHg. Tricuspid Valve: The  tricuspid valve is grossly normal. Tricuspid valve regurgitation is mild. Aortic Valve: The aortic valve is tricuspid. Aortic valve regurgitation is mild. Aortic regurgitation PHT measures 525 msec. Mild aortic valve sclerosis is present, with no evidence of aortic valve stenosis. Aortic valve mean gradient measures 5.0 mmHg. Aortic valve peak gradient measures 9.5 mmHg. Aortic valve area, by VTI measures 2.29 cm. Pulmonic Valve: The pulmonic valve was not well visualized. Pulmonic valve regurgitation is not visualized. No evidence of pulmonic stenosis. Aorta: Aortic dilatation noted. There is mild dilatation of the aortic root, measuring 41 mm. Pulmonary Artery: The pulmonary artery is not well seen. Venous: The inferior vena cava was not well visualized. IAS/Shunts: The interatrial septum was not well visualized.  LEFT VENTRICLE PLAX 2D LVIDd:         4.50 cm  Diastology LVIDs:         3.50 cm  LV e' medial:    8.81 cm/s LV PW:         1.13 cm  LV E/e' medial:  5.1 LV IVS:        1.00 cm  LV e' lateral:   9.14 cm/s LVOT diam:     2.20 cm  LV E/e' lateral: 4.9 LV SV:         51 LV SV Index:   27 LVOT Area:     3.80 cm  LEFT ATRIUM             Index LA diam:        4.10 cm 2.15 cm/m LA Vol (A2C):   35.7 ml 18.70 ml/m LA Vol (A4C):   43.1 ml 22.58 ml/m LA Biplane Vol: 40.2 ml 21.06 ml/m  AORTIC VALVE                    PULMONIC VALVE AV Area (Vmax):    2.40 cm     PV Vmax:       1.13 m/s AV Area (Vmean):   2.45 cm     PV Vmean:      71.900 cm/s AV Area (VTI):     2.29 cm     PV VTI:        0.150 m AV Vmax:  154.00 cm/s  PV Peak grad:  5.1 mmHg AV Vmean:          104.000 cm/s PV Mean grad:  2.0 mmHg AV VTI:            0.222 m AV Peak Grad:      9.5 mmHg AV Mean Grad:      5.0 mmHg LVOT Vmax:         97.30 cm/s LVOT Vmean:        67.100 cm/s LVOT VTI:          0.134 m LVOT/AV VTI ratio: 0.60 AI PHT:            525 msec  AORTA Ao Root diam: 4.10 cm MITRAL VALVE MV Area (PHT): 5.42 cm    SHUNTS MV Area  VTI:   2.89 cm    Systemic VTI:  0.13 m MV Peak grad:  2.8 mmHg    Systemic Diam: 2.20 cm MV Mean grad:  1.0 mmHg MV Vmax:       0.84 m/s MV Vmean:      48.4 cm/s MV Decel Time: 140 msec MV E velocity: 45.00 cm/s MV A velocity: 81.40 cm/s MV E/A ratio:  0.55 Harrell Gave End MD Electronically signed by Nelva Bush MD Signature Date/Time: 02/03/2021/2:51:50 PM    Final    US Abdomen Limited RUQ (LIVER/GB)  Result Date: 02/03/2021 CLINICAL DATA:  Gallstone. Pancreatitis. Evaluate common bile duct. EXAM: ULTRASOUND ABDOMEN LIMITED RIGHT UPPER QUADRANT COMPARISON:  CT 02/01/2021 FINDINGS: Gallbladder: Borderline minimal gallbladder wall thickening. Single visible gallstone measuring 1 cm in diameter. Small amount of gallbladder sludge. Patient is somewhat tender in this area, difficult to interpret as a Murphy sign given the presence of pancreatitis. Common bile duct: Diameter: 3 mm. Normal. No evidence of dilatation or choledocholithiasis. Liver: 1.5 cm hypoechoic area in the medial right lobe probably represents a venous varix based on the previous CT. This is not clinically significant. Liver parenchyma otherwise is normal. Portal vein is patent on color Doppler imaging with normal direction of blood flow towards the liver. Other: Small amount of adjacent ascitic fluid. IMPRESSION: 1 cm gallstone. Mild gallbladder wall thickening. Common duct is normal. Patient is tender in the region. This may be due to the pancreatitis shown by CT. Cannot rule out a true [place] sign, though that is felt less likely. Consider correlation with nuclear medicine hepatobiliary scan if concern persists regarding the possibility of cholecystitis. Electronically Signed   By: Nelson Chimes M.D.   On: 02/03/2021 11:23        Scheduled Meds:  atorvastatin  20 mg Oral q1800   carvedilol  3.125 mg Oral BID WC   pantoprazole (PROTONIX) IV  40 mg Intravenous Q12H   tamsulosin  0.4 mg Oral QPC breakfast   Continuous Infusions:   chlorproMAZINE (THORAZINE) IV     lactated ringers 100 mL/hr at 02/04/21 1303     LOS: 3 days    Time spent: 25 minutes    Sidney Ace, MD [facility] Pager [phone]  If 7PM-7AM, please contact night-coverage 02/04/2021, 1:15 PM

## 2021-02-05 ENCOUNTER — Other Ambulatory Visit: Payer: Self-pay

## 2021-02-05 DIAGNOSIS — R972 Elevated prostate specific antigen [PSA]: Secondary | ICD-10-CM

## 2021-02-05 LAB — CBC WITH DIFFERENTIAL/PLATELET
Abs Immature Granulocytes: 0.03 10*3/uL (ref 0.00–0.07)
Basophils Absolute: 0 10*3/uL (ref 0.0–0.1)
Basophils Relative: 0 %
Eosinophils Absolute: 0 10*3/uL (ref 0.0–0.5)
Eosinophils Relative: 0 %
HCT: 32.3 % — ABNORMAL LOW (ref 39.0–52.0)
Hemoglobin: 11.2 g/dL — ABNORMAL LOW (ref 13.0–17.0)
Immature Granulocytes: 0 %
Lymphocytes Relative: 4 %
Lymphs Abs: 0.4 10*3/uL — ABNORMAL LOW (ref 0.7–4.0)
MCH: 31.4 pg (ref 26.0–34.0)
MCHC: 34.7 g/dL (ref 30.0–36.0)
MCV: 90.5 fL (ref 80.0–100.0)
Monocytes Absolute: 0.8 10*3/uL (ref 0.1–1.0)
Monocytes Relative: 7 %
Neutro Abs: 10.7 10*3/uL — ABNORMAL HIGH (ref 1.7–7.7)
Neutrophils Relative %: 89 %
Platelets: 127 10*3/uL — ABNORMAL LOW (ref 150–400)
RBC: 3.57 MIL/uL — ABNORMAL LOW (ref 4.22–5.81)
RDW: 13.6 % (ref 11.5–15.5)
Smear Review: NORMAL
WBC: 11.9 10*3/uL — ABNORMAL HIGH (ref 4.0–10.5)
nRBC: 0 % (ref 0.0–0.2)

## 2021-02-05 LAB — COMPREHENSIVE METABOLIC PANEL
ALT: 10 U/L (ref 0–44)
AST: 16 U/L (ref 15–41)
Albumin: 2.5 g/dL — ABNORMAL LOW (ref 3.5–5.0)
Alkaline Phosphatase: 52 U/L (ref 38–126)
Anion gap: 7 (ref 5–15)
BUN: 45 mg/dL — ABNORMAL HIGH (ref 8–23)
CO2: 28 mmol/L (ref 22–32)
Calcium: 7.7 mg/dL — ABNORMAL LOW (ref 8.9–10.3)
Chloride: 103 mmol/L (ref 98–111)
Creatinine, Ser: 1.82 mg/dL — ABNORMAL HIGH (ref 0.61–1.24)
GFR, Estimated: 37 mL/min — ABNORMAL LOW (ref >60)
Glucose, Bld: 96 mg/dL (ref 70–99)
Potassium: 3.5 mmol/L (ref 3.5–5.1)
Sodium: 138 mmol/L (ref 135–145)
Total Bilirubin: 1.7 mg/dL — ABNORMAL HIGH (ref 0.3–1.2)
Total Protein: 5.8 g/dL — ABNORMAL LOW (ref 6.5–8.1)

## 2021-02-05 LAB — URINE CULTURE: Special Requests: NORMAL

## 2021-02-05 MED ORDER — PANTOPRAZOLE SODIUM 40 MG PO TBEC
40.0000 mg | DELAYED_RELEASE_TABLET | Freq: Two times a day (BID) | ORAL | Status: DC
  Administered 2021-02-05 – 2021-02-10 (×8): 40 mg via ORAL
  Filled 2021-02-05 (×8): qty 1

## 2021-02-05 NOTE — Progress Notes (Signed)
Occupational Therapy Evaluation Patient Details Name: James Mora MRN: [id number] DOB: [birth year] Today's Date: 02/05/2021    History of Present Illness As per EMR: Patient admitted 02/01/2021 with nausea vomiting abdominal pain and diarrhea.  He was diagnosed with acute pancreatitis with gallstones and enteritis seen on CT scan. Patient initially scheduled for cholecystectomy on 6/29.  However in the morning he was complaining of acutely worsened abdominal pain.  Unclear etiology.  Lipase and troponin both reassuring.  Discussed with general surgery. Cholecystectomy on hold for now pending improvement in pain control.  Work-up included repeat lipase which is trending down as well as troponin which is not indicative of acute coronary syndrome   Clinical Impression   Chart reviewed to date, pt greeted in bed with wife present at bedside. Pt agreeable to OT evaluation. Pt was MOD I-I in all ADL/IADL PTA, presents with generalized decreased independence in ADL/mobility function following hospitalization. Pt lives with his wife who reports she will assist as needed. Pt required vcs throughout for safe mobility with RW. Pt would benefit from [facility] OT to address functional deficits to and to facilitate safe ADL completion in the home. OT will follow while admitted.     Follow Up Recommendations  Home health OT    Equipment Recommendations  3 in 1 bedside commode    Recommendations for Other Services       Precautions / Restrictions Precautions Precautions: Fall Restrictions Weight Bearing Restrictions: No      Mobility Bed Mobility Overal bed mobility: Needs Assistance Bed Mobility: Supine to Sit;Sit to Supine     Supine to sit: Min assist Sit to supine: Min assist        Transfers Overall transfer level: Needs assistance Equipment used: Rolling walker (2 wheeled) Transfers: Sit to/from [facility] Sit to Stand: Min guard;Min assist Stand pivot transfers: Min  guard       General transfer comment: CGA with RW for ~5 lateral steps from EOB to bedside chair    Balance Overall balance assessment: Needs assistance Sitting-balance support: Feet supported Sitting balance-Leahy Scale: Good Sitting balance - Comments: vcs required for upright sitting- kyphosis noted Postural control: Posterior lean Standing balance support: Bilateral upper extremity supported Standing balance-Leahy Scale: Fair Standing balance comment: posterior lean with extended standing during toileting tasks                           ADL either performed or assessed with clinical judgement   ADL Overall ADL's : Needs assistance/impaired     Grooming: Wash/dry hands;Set up;Sitting       Lower Body Bathing: Maximal assistance;Cueing for safety Lower Body Bathing Details (indicate cue type and reason): peri care     Lower Body Dressing: Minimal assistance;Cueing for safety Lower Body Dressing Details (indicate cue type and reason): for underwear     Toileting- Clothing Manipulation and Hygiene: Minimal assistance;Cueing for safety;Sit to/from stand Toileting - Clothing Manipulation Details (indicate cue type and reason): with urinal             Vision         Perception     Praxis      Pertinent Vitals/Pain Pain Assessment: Faces Faces Pain Scale: Hurts little more Pain Location: lower abdomen Pain Intervention(s): Repositioned;Monitored during session     Hand Dominance     Extremity/Trunk Assessment Upper Extremity Assessment Upper Extremity Assessment: Overall WFL for tasks assessed  Cervical / Trunk Assessment Cervical / Trunk Assessment: Kyphotic   Communication Communication Communication: No difficulties   Cognition Arousal/Alertness: Awake/alert Behavior During Therapy: WFL for tasks assessed/performed Overall Cognitive Status: Impaired/Different from baseline Area of Impairment: Awareness;Problem solving                              Problem Solving: Requires verbal cues;Requires tactile cues General Comments: for general ADL tasks   General Comments       Exercises     Shoulder Instructions      Home Living Family/patient expects to be discharged to:: Private residence Living Arrangements: Spouse/significant other Available Help at Discharge: Family Type of Home: House Home Access: Stairs to enter Technical brewer of Steps: 3 Entrance Stairs-Rails: Right Home Layout: One level (one step up to bedroom with rail on R)     Bathroom Shower/Tub:  (pt reports he sponge bathes)         Home Equipment: Cane - single point;Walker - 2 wheels          Prior Functioning/Environment Level of Independence: Independent with assistive device(s)        Comments: pt uses Bond per wife; pt also uses " walking stick"        OT Problem List: Decreased strength;Decreased activity tolerance;Decreased cognition;Impaired balance (sitting and/or standing);Decreased safety awareness      OT Treatment/Interventions: Self-care/ADL training;Energy conservation;Cognitive remediation/compensation;DME and/or AE instruction;Therapeutic activities;Patient/family education    OT Goals(Current goals can be found in the care plan section) Acute Rehab OT Goals Patient Stated Goal: to go home OT Goal Formulation: With patient Time For Goal Achievement: 02/12/21 Potential to Achieve Goals: Good ADL Goals Pt Will Perform Grooming: with modified independence;standing Pt Will Transfer to Toilet: with modified independence Pt Will Perform Toileting - Clothing Manipulation and hygiene: with modified independence;sit to/from stand  OT Frequency: Min 1X/week   Barriers to D/C:            Co-evaluation              AM-PAC OT "6 Clicks" Daily Activity     Outcome Measure Help from another person eating meals?: A Little Help from another person taking care of personal grooming?: A  Little Help from another person toileting, which includes using toliet, bedpan, or urinal?: A Lot Help from another person bathing (including washing, rinsing, drying)?: A Lot Help from another person to put on and taking off regular upper body clothing?: A Little Help from another person to put on and taking off regular lower body clothing?: A Lot 6 Click Score: 15   End of Session Equipment Utilized During Treatment: Rolling walker  Activity Tolerance: Patient tolerated treatment well Patient left: in chair;with chair alarm set;with call bell/phone within reach  OT Visit Diagnosis: Unsteadiness on feet (R26.81);Other abnormalities of gait and mobility (R26.89)                Time: 9562-1308 OT Time Calculation (min): 30 min Charges:  OT General Charges $OT Visit: 1 Visit OT Evaluation $OT Eval Moderate Complexity: 1 Mod OT Treatments $Self Care/Home Management : 8-22 mins  Shanon Payor, OTD OTR/L  02/05/21, 4:06 PM

## 2021-02-05 NOTE — Progress Notes (Signed)
PROGRESS NOTE    James Mora  VZD:[id number] DOB: [birth year] DOA: 02/01/2021 PCP: [name], PA-C   Brief Narrative:  Patient admitted 02/01/2021 with nausea vomiting abdominal pain and diarrhea.  He was diagnosed with acute pancreatitis with gallstones and enteritis seen on CT scan.  Patient was started on PPI therapy.  General surgery and gastroenterology consulted.  Lipase has trended better and general surgery will talk to the patient about removing the gallbladder.  Patient was initially scheduled for cholecystectomy on 6/29.  However in the morning he was complaining of acutely worsened abdominal pain.  Unclear etiology.  Lipase and troponin both reassuring.  Discussed with general surgery.  Cholecystectomy on hold for now pending improvement in pain control.  Work-up included repeat lipase which is trending down as well as troponin which is not indicative of acute coronary syndrome   Assessment & Plan:   Active Problems:   Benign prostatic hyperplasia without lower urinary tract symptoms   Acute pancreatitis   Gallstones   Enteritis   Gallstone pancreatitis   Shortness of breath  Acute gallstone pancreatitis Lipase trending down, down to 60 as of 6/29 Patient had recurrence of pain on 6/29 Unclear etiology, possibly pancreatitis versus peritonitis versus passed stone Continues to have pain, rated as of 4/10 on 6/30 Leukocytosis improving Plan: Continue IV fluids Clear liquid diet Pain control as needed Surgery to follow-up regarding possible elective cholecystectomy during hospital admission  Acute kidney injury on chronic kidney disease stage IIIa Baseline creatinine 1.64 Creatinine peaked at 2.23, subsequently downtrending 1.82 as of 6/30 Plan: IVF as above   Acute enteritis on CAT scan Suspect secondary inflammation from acute pancreatitis Continue IV PPI and pain control  History of coronary disease Hyperlipidemia Echocardiogram reassuring No  further inpatient cardiac diagnostics Plan: Continue Coreg and atorvastatin Continue telemetry monitoring  Essential hypertension Well-controlled over interval Continue Coreg and enalapril  GERD PPI  BPH Flomax   DVT prophylaxis: SCDs Code Status: Full Family Communication: Wife at bedside 6/29, 6/30 Disposition Plan: Status is: Inpatient  Remains inpatient appropriate because:Inpatient level of care appropriate due to severity of illness  Dispo: The patient is from: Home              Anticipated d/c is to: Home              Patient currently is not medically stable to d/c.   Difficult to place patient No  Acute gallstone pancreatitis.  Lipase essentially normalized.  Patient still having some residual abdominal pain.  Laboratory markers are reassuring.  Possible cholecystectomy during his hospital admission.     Level of care: Med-Surg  Consultants:  General surgery Cardiology  Procedures:  None  Antimicrobials:  None   Subjective: Seen and examined.  Complains of some abdominal pain and hiccups this morning.  Objective: Vitals:   02/05/21 0434 02/05/21 0808 02/05/21 0811 02/05/21 0815  BP: (!) 105/57 (!) 160/80 (!) 141/84 (!) 155/74  Pulse: 92 84 97 (!) 102  Resp: 16 (!) 22 (!) 24 (!) 28  Temp: 97.6 F (36.4 C) 98.5 F (36.9 C)    TempSrc:  Oral    SpO2: 99% 93% 91% 90%  Weight:      Height:        Intake/Output Summary (Last 24 hours) at 02/05/2021 1022 Last data filed at 02/05/2021 1011 Gross per 24 hour  Intake 1063.99 ml  Output 1200 ml  Net -136.01 ml   Filed Weights   02/01/21 1644  02/01/21 2319  Weight: 74.8 kg 73.5 kg    Examination:  General exam: Mild distress due to pain Respiratory system: Clear to auscultation. Respiratory effort normal. Cardiovascular system: S1-S2, tachycardic, no murmurs, no pedal edema  gastrointestinal system: Soft, nondistended, tender to palpation left upper quadrant and epigastrium Central nervous  system: Alert and oriented. No focal neurological deficits. Extremities: Symmetric 5 x 5 power. Skin: No rashes, lesions or ulcers Psychiatry: Judgement and insight appear normal. Mood & affect appropriate.     Data Reviewed: I have personally reviewed following labs and imaging studies  CBC: Recent Labs  Lab 02/01/21 1658 02/02/21 0448 02/04/21 0445 02/05/21 0405  WBC 13.6* 13.8* 12.1* 11.9*  NEUTROABS  --   --  10.8* 10.7*  HGB 14.5 15.0 11.7* 11.2*  HCT 42.2 44.8 35.8* 32.3*  MCV 91.7 91.1 93.2 90.5  PLT 183 162 120* 025*   Basic Metabolic Panel: Recent Labs  Lab 02/01/21 1658 02/02/21 0448 02/02/21 1510 02/03/21 0420 02/04/21 0445 02/05/21 0405  NA 139 140 137 140 138 138  K 4.1 3.7 3.8 3.6 3.5 3.5  CL 108 112* 109 107 104 103  CO2 21* 23 20* 24 25 28   GLUCOSE 184* 117* 164* 103* 95 96  BUN 31* 37* 40* 46* 50* 45*  CREATININE 1.78* 1.93* 1.97* 2.23* 2.21* 1.82*  CALCIUM 9.1 8.2* 7.9* 8.0* 7.8* 7.7*  MG 1.7  --   --   --   --   --    GFR: Estimated Creatinine Clearance: 32.3 mL/min (A) (by C-G formula based on SCr of 1.82 mg/dL (H)). Liver Function Tests: Recent Labs  Lab 02/01/21 1658 02/02/21 0448 02/03/21 0420 02/04/21 0445 02/05/21 0405  AST 18 17 29 18 16   ALT 13 11 8 8 10   ALKPHOS 69 62 56 50 52  BILITOT 1.7* 1.5* 1.7* 1.7* 1.7*  PROT 7.4 6.8 6.2* 5.9* 5.8*  ALBUMIN 4.2 3.7 3.0* 2.7* 2.5*   Recent Labs  Lab 02/01/21 1658 02/02/21 0448 02/03/21 0420 02/04/21 0445  LIPASE 1,666* 713* 184* 60*   No results for input(s): AMMONIA in the last 168 hours. Coagulation Profile: No results for input(s): INR, PROTIME in the last 168 hours. Cardiac Enzymes: No results for input(s): CKTOTAL, CKMB, CKMBINDEX, TROPONINI in the last 168 hours. BNP (last 3 results) No results for input(s): PROBNP in the last 8760 hours. HbA1C: No results for input(s): HGBA1C in the last 72 hours. CBG: No results for input(s): GLUCAP in the last 168 hours. Lipid  Profile: No results for input(s): CHOL, HDL, LDLCALC, TRIG, CHOLHDL, LDLDIRECT in the last 72 hours.  Thyroid Function Tests: No results for input(s): TSH, T4TOTAL, FREET4, T3FREE, THYROIDAB in the last 72 hours. Anemia Panel: No results for input(s): VITAMINB12, FOLATE, FERRITIN, TIBC, IRON, RETICCTPCT in the last 72 hours. Sepsis Labs: No results for input(s): PROCALCITON, LATICACIDVEN in the last 168 hours.  Recent Results (from the past 240 hour(s))  Resp Panel by RT-PCR (Flu A&B, Covid) Nasopharyngeal Swab     Status: None   Collection Time: 02/01/21  5:54 PM   Specimen: Nasopharyngeal Swab; Nasopharyngeal(NP) swabs in vial transport medium  Result Value Ref Range Status   SARS Coronavirus 2 by RT PCR NEGATIVE NEGATIVE Final    Comment: (NOTE) SARS-CoV-2 target nucleic acids are NOT DETECTED.  The SARS-CoV-2 RNA is generally detectable in upper respiratory specimens during the acute phase of infection. The lowest concentration of SARS-CoV-2 viral copies this assay can detect is 138 copies/mL. A negative  result does not preclude SARS-Cov-2 infection and should not be used as the sole basis for treatment or other patient management decisions. A negative result may occur with  improper specimen collection/handling, submission of specimen other than nasopharyngeal swab, presence of viral mutation(s) within the areas targeted by this assay, and inadequate number of viral copies(<138 copies/mL). A negative result must be combined with clinical observations, patient history, and epidemiological information. The expected result is Negative.  Fact Sheet for Patients:  EntrepreneurPulse.com.au  Fact Sheet for Healthcare Providers:  IncredibleEmployment.be  This test is no t yet approved or cleared by the [place] FDA and  has been authorized for detection and/or diagnosis of SARS-CoV-2 by FDA under an Emergency Use Authorization (EUA). This  EUA will remain  in effect (meaning this test can be used) for the duration of the COVID-19 declaration under Section 564(b)(1) of the Act, 21 U.S.C.section 360bbb-3(b)(1), unless the authorization is terminated  or revoked sooner.       Influenza A by PCR NEGATIVE NEGATIVE Final   Influenza B by PCR NEGATIVE NEGATIVE Final    Comment: (NOTE) The Xpert Xpress SARS-CoV-2/FLU/RSV plus assay is intended as an aid in the diagnosis of influenza from Nasopharyngeal swab specimens and should not be used as a sole basis for treatment. Nasal washings and aspirates are unacceptable for Xpert Xpress SARS-CoV-2/FLU/RSV testing.  Fact Sheet for Patients: EntrepreneurPulse.com.au  Fact Sheet for Healthcare Providers: IncredibleEmployment.be  This test is not yet approved or cleared by the [place] FDA and has been authorized for detection and/or diagnosis of SARS-CoV-2 by FDA under an Emergency Use Authorization (EUA). This EUA will remain in effect (meaning this test can be used) for the duration of the COVID-19 declaration under Section 564(b)(1) of the Act, 21 U.S.C. section 360bbb-3(b)(1), unless the authorization is terminated or revoked.  Performed at [facility], [address], [place], [place] [address]   Urine Culture     Status: Abnormal   Collection Time: 02/03/21  4:15 PM   Specimen: Urine, Clean Catch  Result Value Ref Range Status   Specimen Description   Final    URINE, CLEAN CATCH Performed at [facility], [address], [place], [place] [address]    Special Requests   Final    Normal Performed at [facility], [address] [place], [place] [address]    Culture MULTIPLE SPECIES PRESENT, SUGGEST RECOLLECTION (A)  Final   Report Status 02/05/2021 FINAL  Final         Radiology Studies: No results found.      Scheduled Meds:  atorvastatin  20 mg Oral q1800   carvedilol  3.125 mg  Oral BID WC   pantoprazole (PROTONIX) IV  40 mg Intravenous Q12H   tamsulosin  0.4 mg Oral QPC breakfast   Continuous Infusions:  chlorproMAZINE (THORAZINE) IV 12.5 mg (02/05/21 0919)   lactated ringers 100 mL/hr at 02/05/21 0201     LOS: 4 days    Time spent: 25 minutes    Sidney Ace, MD [facility] Pager [phone]  If 7PM-7AM, please contact night-coverage 02/05/2021, 10:22 AM

## 2021-02-06 ENCOUNTER — Inpatient Hospital Stay: Payer: Medicare Other

## 2021-02-06 ENCOUNTER — Encounter: Payer: Self-pay | Admitting: Family Medicine

## 2021-02-06 ENCOUNTER — Encounter: Payer: Self-pay | Admitting: Anesthesiology

## 2021-02-06 ENCOUNTER — Encounter
Admission: EM | Disposition: A | Discharge status: Other Acute Inpatient Hospital | Payer: Self-pay | Source: Home / Self Care | Attending: Internal Medicine

## 2021-02-06 LAB — CBC WITH DIFFERENTIAL/PLATELET
Abs Immature Granulocytes: 0.09 10*3/uL — ABNORMAL HIGH (ref 0.00–0.07)
Basophils Absolute: 0 10*3/uL (ref 0.0–0.1)
Basophils Relative: 0 %
Eosinophils Absolute: 0 10*3/uL (ref 0.0–0.5)
Eosinophils Relative: 0 %
HCT: 32.8 % — ABNORMAL LOW (ref 39.0–52.0)
Hemoglobin: 11 g/dL — ABNORMAL LOW (ref 13.0–17.0)
Immature Granulocytes: 1 %
Lymphocytes Relative: 5 %
Lymphs Abs: 0.5 10*3/uL — ABNORMAL LOW (ref 0.7–4.0)
MCH: 31 pg (ref 26.0–34.0)
MCHC: 33.5 g/dL (ref 30.0–36.0)
MCV: 92.4 fL (ref 80.0–100.0)
Monocytes Absolute: 1 10*3/uL (ref 0.1–1.0)
Monocytes Relative: 9 %
Neutro Abs: 10.4 10*3/uL — ABNORMAL HIGH (ref 1.7–7.7)
Neutrophils Relative %: 85 %
Platelets: 134 10*3/uL — ABNORMAL LOW (ref 150–400)
RBC: 3.55 MIL/uL — ABNORMAL LOW (ref 4.22–5.81)
RDW: 13.7 % (ref 11.5–15.5)
Smear Review: NORMAL
WBC: 12 10*3/uL — ABNORMAL HIGH (ref 4.0–10.5)
nRBC: 0 % (ref 0.0–0.2)

## 2021-02-06 LAB — COMPREHENSIVE METABOLIC PANEL
ALT: 11 U/L (ref 0–44)
AST: 18 U/L (ref 15–41)
Albumin: 2.3 g/dL — ABNORMAL LOW (ref 3.5–5.0)
Alkaline Phosphatase: 54 U/L (ref 38–126)
Anion gap: 7 (ref 5–15)
BUN: 38 mg/dL — ABNORMAL HIGH (ref 8–23)
CO2: 28 mmol/L (ref 22–32)
Calcium: 7.7 mg/dL — ABNORMAL LOW (ref 8.9–10.3)
Chloride: 102 mmol/L (ref 98–111)
Creatinine, Ser: 1.55 mg/dL — ABNORMAL HIGH (ref 0.61–1.24)
GFR, Estimated: 44 mL/min — ABNORMAL LOW (ref >60)
Glucose, Bld: 103 mg/dL — ABNORMAL HIGH (ref 70–99)
Potassium: 3.1 mmol/L — ABNORMAL LOW (ref 3.5–5.1)
Sodium: 137 mmol/L (ref 135–145)
Total Bilirubin: 1.6 mg/dL — ABNORMAL HIGH (ref 0.3–1.2)
Total Protein: 5.5 g/dL — ABNORMAL LOW (ref 6.5–8.1)

## 2021-02-06 SURGERY — CHOLECYSTECTOMY, ROBOT-ASSISTED, LAPAROSCOPIC
Anesthesia: Choice

## 2021-02-06 MED ORDER — PROPOFOL 10 MG/ML IV BOLUS
INTRAVENOUS | Status: AC
  Filled 2021-02-06: qty 20

## 2021-02-06 MED ORDER — ROCURONIUM BROMIDE 10 MG/ML (PF) SYRINGE
PREFILLED_SYRINGE | INTRAVENOUS | Status: AC
  Filled 2021-02-06: qty 10

## 2021-02-06 MED ORDER — PHENYLEPHRINE HCL (PRESSORS) 10 MG/ML IV SOLN
INTRAVENOUS | Status: AC
  Filled 2021-02-06: qty 1

## 2021-02-06 MED ORDER — LIDOCAINE HCL (PF) 2 % IJ SOLN
INTRAMUSCULAR | Status: AC
  Filled 2021-02-06: qty 5

## 2021-02-06 MED ORDER — POTASSIUM CHLORIDE CRYS ER 20 MEQ PO TBCR
40.0000 meq | EXTENDED_RELEASE_TABLET | Freq: Once | ORAL | Status: AC
  Administered 2021-02-06: 08:00:00 40 meq via ORAL
  Filled 2021-02-06: qty 2

## 2021-02-06 MED ORDER — INDOCYANINE GREEN 25 MG IV SOLR
1.2500 mg | Freq: Once | INTRAVENOUS | Status: DC
  Filled 2021-02-06: qty 10

## 2021-02-06 MED ORDER — MUPIROCIN 2 % EX OINT
1.0000 "application " | TOPICAL_OINTMENT | Freq: Two times a day (BID) | CUTANEOUS | Status: DC
  Filled 2021-02-06: qty 22

## 2021-02-06 MED ORDER — ONDANSETRON HCL 4 MG/2ML IJ SOLN
INTRAMUSCULAR | Status: AC
  Filled 2021-02-06: qty 2

## 2021-02-06 MED ORDER — IOHEXOL 300 MG/ML  SOLN
75.0000 mL | Freq: Once | INTRAMUSCULAR | Status: AC | PRN
  Administered 2021-02-06: 14:00:00 75 mL via INTRAVENOUS

## 2021-02-06 MED ORDER — DEXAMETHASONE SODIUM PHOSPHATE 10 MG/ML IJ SOLN
INTRAMUSCULAR | Status: AC
  Filled 2021-02-06: qty 1

## 2021-02-06 SURGICAL SUPPLY — 52 items
APL PRP STRL LF DISP 70% ISPRP (MISCELLANEOUS) ×1
BAG INFUSER PRESSURE 100CC (MISCELLANEOUS) IMPLANT
BLADE SURG SZ11 CARB STEEL (BLADE) ×3 IMPLANT
CANISTER SUCT 1200ML W/VALVE (MISCELLANEOUS) ×3 IMPLANT
CANNULA REDUC XI 12-8 STAPL (CANNULA) ×1
CANNULA REDUC XI 12-8MM STAPL (CANNULA) ×1
CANNULA REDUCER 12-8 DVNC XI (CANNULA) ×1 IMPLANT
CHLORAPREP W/TINT 26 (MISCELLANEOUS) ×3 IMPLANT
CLIP VESOLOCK MED LG 6/CT (CLIP) ×3 IMPLANT
DECANTER SPIKE VIAL GLASS SM (MISCELLANEOUS) ×6 IMPLANT
DEFOGGER SCOPE WARMER CLEARIFY (MISCELLANEOUS) ×3 IMPLANT
DERMABOND ADVANCED (GAUZE/BANDAGES/DRESSINGS) ×2
DERMABOND ADVANCED .7 DNX12 (GAUZE/BANDAGES/DRESSINGS) ×1 IMPLANT
DRAPE ARM DVNC X/XI (DISPOSABLE) ×4 IMPLANT
DRAPE COLUMN DVNC XI (DISPOSABLE) ×1 IMPLANT
DRAPE DA VINCI XI ARM (DISPOSABLE) ×8
DRAPE DA VINCI XI COLUMN (DISPOSABLE) ×2
ELECT REM PT RETURN 9FT ADLT (ELECTROSURGICAL) ×3
ELECTRODE REM PT RTRN 9FT ADLT (ELECTROSURGICAL) ×1 IMPLANT
GAUZE 4X4 16PLY ~~LOC~~+RFID DBL (SPONGE) ×3 IMPLANT
GLOVE SURG ENC MOIS LTX SZ6.5 (GLOVE) ×6 IMPLANT
GLOVE SURG UNDER POLY LF SZ6.5 (GLOVE) ×6 IMPLANT
GOWN STRL REUS W/ TWL LRG LVL3 (GOWN DISPOSABLE) ×3 IMPLANT
GOWN STRL REUS W/TWL LRG LVL3 (GOWN DISPOSABLE) ×6
GRASPER SUT TROCAR 14GX15 (MISCELLANEOUS) ×3 IMPLANT
IRRIGATOR SUCT 8 DISP DVNC XI (IRRIGATION / IRRIGATOR) IMPLANT
IRRIGATOR SUCTION 8MM XI DISP (IRRIGATION / IRRIGATOR)
IV NS 1000ML (IV SOLUTION)
IV NS 1000ML BAXH (IV SOLUTION) IMPLANT
KIT PINK PAD W/HEAD ARE REST (MISCELLANEOUS) ×3
KIT PINK PAD W/HEAD ARM REST (MISCELLANEOUS) ×1 IMPLANT
LABEL OR SOLS (LABEL) ×3 IMPLANT
MANIFOLD NEPTUNE II (INSTRUMENTS) ×3 IMPLANT
NEEDLE HYPO 22GX1.5 SAFETY (NEEDLE) ×3 IMPLANT
NEEDLE INSUFFLATION 14GA 120MM (NEEDLE) ×3 IMPLANT
NS IRRIG 500ML POUR BTL (IV SOLUTION) ×3 IMPLANT
OBTURATOR OPTICAL STANDARD 8MM (TROCAR) ×2
OBTURATOR OPTICAL STND 8 DVNC (TROCAR) ×1
OBTURATOR OPTICALSTD 8 DVNC (TROCAR) ×1 IMPLANT
PACK LAP CHOLECYSTECTOMY (MISCELLANEOUS) ×3 IMPLANT
POUCH SPECIMEN RETRIEVAL 10MM (ENDOMECHANICALS) ×3 IMPLANT
SEAL CANN UNIV 5-8 DVNC XI (MISCELLANEOUS) ×3 IMPLANT
SEAL XI 5MM-8MM UNIVERSAL (MISCELLANEOUS) ×6
SET TUBE SMOKE EVAC HIGH FLOW (TUBING) ×3 IMPLANT
SOLUTION ELECTROLUBE (MISCELLANEOUS) ×3 IMPLANT
SPONGE T-LAP 4X18 ~~LOC~~+RFID (SPONGE) IMPLANT
STAPLER CANNULA SEAL DVNC XI (STAPLE) ×1 IMPLANT
STAPLER CANNULA SEAL XI (STAPLE) ×2
SUT MNCRL 4-0 (SUTURE) ×2
SUT MNCRL 4-0 27XMFL (SUTURE) ×1
SUT VICRYL 0 AB UR-6 (SUTURE) ×3 IMPLANT
SUTURE MNCRL 4-0 27XMF (SUTURE) ×1 IMPLANT

## 2021-02-06 NOTE — Progress Notes (Signed)
Patient ID: James Mora, male   DOB: [birth year], 85 y.o.   MRN: [id number]     [facility] Day(s): 5.   Interval History: Patient seen and re-examined.  Patient continues with epigastric pain.  Pain radiates to his back.  Pain is improved with pain medications.  No aggravating factors.  Patient denies any fever or chills.  Patient denies any nausea or vomiting.  Vital signs in last 24 hours: [min-max] current  Temp:  [98.1 F (36.7 C)-99.7 F (37.6 C)] 98.7 F (37.1 C) (07/01 1527) Pulse Rate:  [95-101] 100 (07/01 1527) Resp:  [16-20] 18 (07/01 1527) BP: (140-161)/(74-89) 143/82 (07/01 1527) SpO2:  [91 %-94 %] 91 % (07/01 1527)     Height: 5\' 10"  (177.8 cm) Weight: 73.5 kg BMI (Calculated): 23.25   Physical Exam:  Constitutional: alert, cooperative and no distress  Respiratory: breathing non-labored at rest  Cardiovascular: regular rate and sinus rhythm  Gastrointestinal: soft, mild-tender  Imaging studies: CT scan of the abdomen and pelvis shows more edema around the pancreas compared to prior CT scan.  This is consistent with persistent pancreatitis.  There is no sign of necrosis or infection.  This explains patient recurrent epigastric pain.  There is no ileus or sign of obstruction.  I personally discussed the CT scan images with the radiologist and compared the CT scan with the previous abdominal pelvis CT scan.   Assessment/Plan:  85 y.o. male with pancreatitis.  New CT scan shows more edema around the pancreas compared to prior CT scan.  This is not completely unexpected.  Patient has been improving his lipase and renal function.  The fact that he continue with recurrent abdominal pain is a sign of not resolving pancreatitis at this moment.  We will cancel surgery today again due to the recurrent pain.  CT scan does not seem to have any further complication of the pancreatitis as there is no sign of necrosis or infection.  My recommendation will be to  continue medical management of pancreatitis.  I will not proceed with cholecystectomy during the admission to let the patient heal and get in better shape for surgery.  I explained this rationale and plan with the patient, the wife and the granddaughter and they agreed.  I discussed the case with hospitalist as well.  We will continue to follow.  This follow-up encounter was more than 30-minute most of the time counseling the patient and coordinating plan of care.  Arnold Long, MD

## 2021-02-06 NOTE — TOC Progression Note (Signed)
Transition of Care [facility]) - Progression Note    Patient Details  Name: James Mora MRN: [id number] Date of Birth: 08/19/1935  [facility] CM/SW Contact  Shelbie Hutching, RN Phone Number: 02/06/2021, 12:50 PM  Clinical Narrative:    OT is recommending home health and PT has recommended SNF.  RNCM met with patient and wife at the bedside.  Patient prefers to go home but will see how he does with the next PT session before deciding against SNF.  Patient agrees to home health.  Corene Cornea with [facility] given referral for RN, PT, and OT.   Wife thinks that they will be able to manage at home when time for dc. TOC will cont to follow.   Expected Discharge Plan: Home/Self Care Barriers to Discharge: Continued Medical Work up  Expected Discharge Plan and Services Expected Discharge Plan: Home/Self Care     Post Acute Care Choice: [place] arrangements for the past 2 months: Single Family Home                 DME Arranged: N/A DME Agency: NA       HH Arranged: RN, PT, OT [place] Agency: [place] ([facility]) Date HH Agency Contacted: 02/06/21 Time [place]: [place] Representative spoke with at [place]: [place] ([place]) Interventions    Readmission Risk Interventions No flowsheet data found.

## 2021-02-06 NOTE — Plan of Care (Signed)
Pt ambulated in the room and to the bathroom today. Reports he is passing flatulence, but no stool yet.  Urine pink during the shift. Morphine given twice for abdominal pain with some improvement. Denies n/v.

## 2021-02-06 NOTE — Care Management Important Message (Signed)
Important Message  Patient Details  Name: James Mora MRN: [id number] Date of Birth: 1936-06-19   Medicare Important Message Given:  Yes     Dannette Barbara 02/06/2021, 11:30 AM

## 2021-02-06 NOTE — Progress Notes (Signed)
Patient ID: James Mora, male   DOB: [birth year], 85 y.o.   MRN: [id number]     [facility] Day(s): 5.   Interval History: Patient seen and examined, no acute events or new complaints overnight. Patient reports feeling severe abdominal pain in her upper abdomen.  Pain feels like it In the upper abdomen.  Pain slightly improved with morphine.  Patient also fairly distended.  He endorsed that he has not had a bowel movement or passing gas since Sunday.  Vital signs in last 24 hours: [min-max] current  Temp:  [98.1 F (36.7 C)-99.7 F (37.6 C)] 98.2 F (36.8 C) (07/01 0820) Pulse Rate:  [95-101] 95 (07/01 0820) Resp:  [16-20] 20 (07/01 0820) BP: (143-161)/(81-89) 161/89 (07/01 0820) SpO2:  [90 %-94 %] 94 % (07/01 0820)     Height: 5\' 10"  (177.8 cm) Weight: 73.5 kg BMI (Calculated): 23.25   Physical Exam:  Constitutional: alert, cooperative and no distress  Respiratory: Increased labor of breath Cardiovascular: regular rate and sinus rhythm  Gastrointestinal: soft, mild-tender, and distended   Imaging studies: No new pertinent imaging studies   Assessment/Plan:  85 y.o. male with gallstone pancreatitis, complicated by pertinent comorbidities including hypertension, coronary artery disease, aortic insufficiency.  Patient was scheduled for surgery today but this morning he was found with increased labor of breath and severe abdominal pain.  The abdomen is distended.  I am concerned about possible ileus from his pancreatitis.  The physical exam shows a distended abdomen with mild tenderness on palpation.  No acute abdomen.  With the distention of the abdomen most likely from an ileus he will be very difficult to proceed with the cholecystectomy.  Since that this is a second attempt to do the cholecystectomy as inpatient and he has been already 5 to 7 days from the onset of pain I think that the most reasonable is to continue the current management of proctitis and then  proceed with cholecystectomy on elective basis in about 4 to 6 weeks from this episode.  Patient has had multiple setbacks during the admission which does not make him a good candidate for surgical management at this moment.  He will need to recover and get better in shape before getting general anesthesia.  I discussed this with the patient and the family.  I will order a CT scan of the abdomen for evaluation of the pancreas and he is bowel distention.  I will continue to follow along.  Continue n.p.o. until CT scan is done.  I encouraged the patient to ambulate.  Arnold Long, MD

## 2021-02-06 NOTE — Progress Notes (Signed)
Occupational Therapy Treatment Patient Details Name: James Mora MRN: [id number] DOB: 28-Mar-1936 Today's Date: 02/06/2021    History of present illness As per EMR: Patient admitted 02/01/2021 with nausea vomiting abdominal pain and diarrhea.  He was diagnosed with acute pancreatitis with gallstones and enteritis seen on CT scan. Patient initially scheduled for cholecystectomy on 6/29.  However in the morning he was complaining of acutely worsened abdominal pain.  Unclear etiology.  Lipase and troponin both reassuring.  Discussed with general surgery. Cholecystectomy on hold for now pending improvement in pain control.  Work-up included repeat lipase which is trending down as well as troponin which is not indicative of acute coronary syndrome.   OT comments  Pt seen for OT tx this date to f/u re: safety with ADLs/ADL mobility. Pt agreeable to getting OOB to restroom as he feels discomfort in his abdominal area and is hoping to have a BM. Pt requires CGA to come to EOB sitting and MIN A for STS transfer with RW for UE support. Pt requires CGA/MIN A for fxl mobility to/from restroom and MIN A with commode transfer. Pt demos decreased fxl activity tolerance and requires extended time in all aspects of mobilization. Pt requires CGA for static standing sink-side with RW to complete hand hygiene. Pt is unsuccessful with BM and only voids/passes gas. Pt returned to bed with bed alarm set and pt's spouse present in room throughout session. Will continue to follow acutely. Updated pt's d/c rec to STR as pt is still requiring significant assistance for ADLs/ADL mobility that his spouse will not be able to safely provide in the home setting. Pt largely pain limited, but does demo weakness and would benefit from more extensive rehabilitative efforts to restore strength and tolerance.   Follow Up Recommendations  SNF    Equipment Recommendations  3 in 1 bedside commode    Recommendations for Other Services       Precautions / Restrictions Precautions Precautions: Fall Restrictions Weight Bearing Restrictions: No       Mobility Bed Mobility Overal bed mobility: Needs Assistance Bed Mobility: Supine to Sit;Sit to Supine     Supine to sit: HOB elevated;Min guard Sit to supine: Min assist   General bed mobility comments: increased assist for LEs for back to bed.    Transfers Overall transfer level: Needs assistance Equipment used: Rolling walker (2 wheeled) Transfers: Sit to/from Stand Sit to Stand: Min assist         General transfer comment: improved control for sit to stand, but requires cues to control descent and MIN A to assist with stand to sit.    Balance Overall balance assessment: Needs assistance Sitting-balance support: Feet supported Sitting balance-Leahy Scale: Fair Sitting balance - Comments: improved static sitting EOB Postural control: Posterior lean Standing balance support: Bilateral upper extremity supported;During functional activity Standing balance-Leahy Scale: Fair Standing balance comment: B UE support on Rw                           ADL either performed or assessed with clinical judgement   ADL Overall ADL's : Needs assistance/impaired     Grooming: Wash/dry hands;Standing;Min guard;Minimal assistance                   Toilet Transfer: Minimal assistance;Ambulation;BSC;Grab bars;RW   Toileting- Clothing Manipulation and Hygiene: Minimal assistance;Sit to/from stand Toileting - Clothing Manipulation Details (indicate cue type and reason): to/from commode with grab bar  Functional mobility during ADLs: Min guard;Rolling walker (to/from restroom)       Vision Patient Visual Report: No change from baseline     Perception     Praxis      Cognition Arousal/Alertness: Awake/alert Behavior During Therapy: WFL for tasks assessed/performed Overall Cognitive Status: Within Functional Limits for tasks assessed                                  General Comments: improved command following and generally more astute conversationally this date.        Exercises Other Exercises Other Exercises: Pt ed re: importance of mobilization as it relates to assisting with GI health and function   Shoulder Instructions       General Comments      Pertinent Vitals/ Pain       Pain Assessment: 0-10 Pain Score: 5  Pain Location: abdominal pain! Pain Descriptors / Indicators: Tightness;Discomfort Pain Intervention(s): Limited activity within patient's tolerance;Monitored during session  Home Living                                          Prior Functioning/Environment              Frequency  Min 1X/week        Progress Toward Goals  OT Goals(current goals can now be found in the care plan section)  Progress towards OT goals: Progressing toward goals  Acute Rehab OT Goals Patient Stated Goal: go home OT Goal Formulation: With patient Time For Goal Achievement: 02/12/21 Potential to Achieve Goals: Good  Plan Discharge plan needs to be updated    Co-evaluation                 AM-PAC OT "6 Clicks" Daily Activity     Outcome Measure   Help from another person eating meals?: A Little Help from another person taking care of personal grooming?: A Little Help from another person toileting, which includes using toliet, bedpan, or urinal?: A Lot Help from another person bathing (including washing, rinsing, drying)?: A Lot Help from another person to put on and taking off regular upper body clothing?: A Little Help from another person to put on and taking off regular lower body clothing?: A Lot 6 Click Score: 15    End of Session Equipment Utilized During Treatment: Rolling walker  OT Visit Diagnosis: Unsteadiness on feet (R26.81);Other abnormalities of gait and mobility (R26.89)   Activity Tolerance Patient tolerated treatment well   Patient Left with call  bell/phone within reach;in bed;with bed alarm set;with family/visitor present   Nurse Communication Mobility status        Time: 0017-4944 OT Time Calculation (min): 23 min  Charges: OT General Charges $OT Visit: 1 Visit OT Treatments $Self Care/Home Management : 8-22 mins $Therapeutic Activity: 8-22 mins  Gerrianne Scale, [place], OTR/L ascom [phone] 02/06/21, 6:33 PM

## 2021-02-06 NOTE — Anesthesia Preprocedure Evaluation (Deleted)
Anesthesia Evaluation    Airway        Dental   Pulmonary           Cardiovascular   Echo 02/03/21: 1. Left ventricular ejection fraction, by estimation, is 70 to 75%. The  left ventricle has hyperdynamic function. The left ventricle has no  regional wall motion abnormalities. Left ventricular diastolic parameters  were normal.  2. Right ventricular systolic function is normal. The right ventricular  size is mildly enlarged. Tricuspid regurgitation signal is inadequate for  assessing PA pressure.  3. The mitral valve is grossly normal. No evidence of mitral valve  regurgitation. No evidence of mitral stenosis.  4. The aortic valve is tricuspid. Aortic valve regurgitation is mild.  Mild aortic valve sclerosis is present, with no evidence of aortic valve  stenosis.  5. Aortic dilatation noted. There is mild dilatation of the aortic root,  measuring 41 mm.    Neuro/Psych    GI/Hepatic   Endo/Other    Renal/GU      Musculoskeletal   Abdominal   Peds  Hematology   Anesthesia Other Findings   Reproductive/Obstetrics                             Anesthesia Physical Anesthesia Plan Anesthesia Quick Evaluation

## 2021-02-06 NOTE — Progress Notes (Signed)
PROGRESS NOTE    James Mora  YHC:[id number] DOB: November 13, 1935 DOA: 02/01/2021 PCP: [name], PA-C   Brief Narrative:  Patient admitted 02/01/2021 with nausea vomiting abdominal pain and diarrhea.  He was diagnosed with acute pancreatitis with gallstones and enteritis seen on CT scan.  Patient was started on PPI therapy.  General surgery and gastroenterology consulted.  Lipase has trended better and general surgery will talk to the patient about removing the gallbladder.  Patient was initially scheduled for cholecystectomy on 6/29.  However in the morning he was complaining of acutely worsened abdominal pain.  Unclear etiology.  Lipase and troponin both reassuring.  Discussed with general surgery.  Cholecystectomy on hold for now pending improvement in pain control.  Work-up included repeat lipase which is trending down as well as troponin which is not indicative of acute coronary syndrome.  On 7/1 patient again rescheduled for cholecystectomy however he has increasing abdominal pain with increasing work of breathing.  Abdomen is mildly distended.  Discussed with general surgery and cholecystectomy on hold and likely will be deferred to outpatient in 4 to 6 weeks.  We will pursue CT abdomen pelvis to evaluate for possible pancreatic fluid collection versus on pancreatitis related ileus   Assessment & Plan:   Active Problems:   Benign prostatic hyperplasia without lower urinary tract symptoms   Acute pancreatitis   Gallstones   Enteritis   Gallstone pancreatitis   Shortness of breath  Acute gallstone pancreatitis Lipase trending down, down to 60 as of 6/29 Patient had recurrence of pain on 6/29 Unclear etiology, possibly pancreatitis versus peritonitis versus passed stone Continues to have pain, rated as of 4/10 on 6/30 Leukocytosis stable Increasing abdominal pain associated with distention  Plan: Surgery on hold.  We will continue IV fluids n.p.o. status for now.  Pursue  CT abdomen pelvis with IV contrast to work-up possible peripancreatic fluid collection versus ileus.  Patient encouraged to ambulate.  Acute kidney injury on chronic kidney disease stage IIIa Baseline creatinine 1.64 Creatinine peaked at 2.23, subsequently downtrending 1.55 as of 7/1 Plan: IVF as above   Acute enteritis on CAT scan Suspect secondary inflammation from acute pancreatitis Continue IV PPI and pain control  History of coronary disease Hyperlipidemia Echocardiogram reassuring No further inpatient cardiac diagnostics Plan: Continue Coreg and atorvastatin Continue telemetry monitoring  Essential hypertension Well-controlled over interval Continue Coreg and enalapril  GERD PPI  BPH Flomax   DVT prophylaxis: SCDs Code Status: Full Family Communication: Wife at bedside 6/29, 6/30, 7/1 Disposition Plan: Status is: Inpatient  Remains inpatient appropriate because:Inpatient level of care appropriate due to severity of illness  Dispo: The patient is from: Home              Anticipated d/c is to: Home              Patient currently is not medically stable to d/c.   Difficult to place patient No  Acute gallstone pancreatitis.  Has had waxing and waning hospital course.  More distended with increased work of breathing this morning.     Level of care: Med-Surg  Consultants:  General surgery Cardiology  Procedures:  None  Antimicrobials:  None   Subjective: Patient seen and examined.  Complains of worsening abdominal pain associated with hiccups.  Increased work of breathing.  Objective: Vitals:   02/05/21 1551 02/05/21 2019 02/06/21 0506 02/06/21 0820  BP: (!) 150/84 (!) 143/81 (!) 147/88 (!) 161/89  Pulse: (!) 101 97 99 95  Resp: 18 16 16 20   Temp: 98.1 F (36.7 C) 99.7 F (37.6 C) 99.6 F (37.6 C) 98.2 F (36.8 C)  TempSrc:   Oral   SpO2: 93% 93% 92% 94%  Weight:      Height:        Intake/Output Summary (Last 24 hours) at 02/06/2021  1040 Last data filed at 02/06/2021 0840 Gross per 24 hour  Intake 3735.49 ml  Output 450 ml  Net 3285.49 ml   Filed Weights   02/01/21 1644 02/01/21 2319  Weight: 74.8 kg 73.5 kg    Examination:  General exam: Mild distress due to pain Respiratory system: Lungs clear.  Increased work of breathing.  Room air Cardiovascular system: S1-S2, tachycardic, no murmurs, no pedal edema  gastrointestinal system: Soft, mildly distended, tender to palpation diffusely Central nervous system: Alert and oriented. No focal neurological deficits. Extremities: Symmetric 5 x 5 power. Skin: No rashes, lesions or ulcers Psychiatry: Judgement and insight appear normal. Mood & affect appropriate.     Data Reviewed: I have personally reviewed following labs and imaging studies  CBC: Recent Labs  Lab 02/01/21 1658 02/02/21 0448 02/04/21 0445 02/05/21 0405 02/06/21 0455  WBC 13.6* 13.8* 12.1* 11.9* 12.0*  NEUTROABS  --   --  10.8* 10.7* 10.4*  HGB 14.5 15.0 11.7* 11.2* 11.0*  HCT 42.2 44.8 35.8* 32.3* 32.8*  MCV 91.7 91.1 93.2 90.5 92.4  PLT 183 162 120* 127* 505*   Basic Metabolic Panel: Recent Labs  Lab 02/01/21 1658 02/02/21 0448 02/02/21 1510 02/03/21 0420 02/04/21 0445 02/05/21 0405 02/06/21 0455  NA 139   < > 137 140 138 138 137  K 4.1   < > 3.8 3.6 3.5 3.5 3.1*  CL 108   < > 109 107 104 103 102  CO2 21*   < > 20* 24 25 28 28   GLUCOSE 184*   < > 164* 103* 95 96 103*  BUN 31*   < > 40* 46* 50* 45* 38*  CREATININE 1.78*   < > 1.97* 2.23* 2.21* 1.82* 1.55*  CALCIUM 9.1   < > 7.9* 8.0* 7.8* 7.7* 7.7*  MG 1.7  --   --   --   --   --   --    < > = values in this interval not displayed.   GFR: Estimated Creatinine Clearance: 37.9 mL/min (A) (by C-G formula based on SCr of 1.55 mg/dL (H)). Liver Function Tests: Recent Labs  Lab 02/02/21 0448 02/03/21 0420 02/04/21 0445 02/05/21 0405 02/06/21 0455  AST 17 29 18 16 18   ALT 11 8 8 10 11   ALKPHOS 62 56 50 52 54  BILITOT 1.5*  1.7* 1.7* 1.7* 1.6*  PROT 6.8 6.2* 5.9* 5.8* 5.5*  ALBUMIN 3.7 3.0* 2.7* 2.5* 2.3*   Recent Labs  Lab 02/01/21 1658 02/02/21 0448 02/03/21 0420 02/04/21 0445  LIPASE 1,666* 713* 184* 60*   No results for input(s): AMMONIA in the last 168 hours. Coagulation Profile: No results for input(s): INR, PROTIME in the last 168 hours. Cardiac Enzymes: No results for input(s): CKTOTAL, CKMB, CKMBINDEX, TROPONINI in the last 168 hours. BNP (last 3 results) No results for input(s): PROBNP in the last 8760 hours. HbA1C: No results for input(s): HGBA1C in the last 72 hours. CBG: No results for input(s): GLUCAP in the last 168 hours. Lipid Profile: No results for input(s): CHOL, HDL, LDLCALC, TRIG, CHOLHDL, LDLDIRECT in the last 72 hours.  Thyroid Function Tests: No results for input(s):  TSH, T4TOTAL, FREET4, T3FREE, THYROIDAB in the last 72 hours. Anemia Panel: No results for input(s): VITAMINB12, FOLATE, FERRITIN, TIBC, IRON, RETICCTPCT in the last 72 hours. Sepsis Labs: No results for input(s): PROCALCITON, LATICACIDVEN in the last 168 hours.  Recent Results (from the past 240 hour(s))  Resp Panel by RT-PCR (Flu A&B, Covid) Nasopharyngeal Swab     Status: None   Collection Time: 02/01/21  5:54 PM   Specimen: Nasopharyngeal Swab; Nasopharyngeal(NP) swabs in vial transport medium  Result Value Ref Range Status   SARS Coronavirus 2 by RT PCR NEGATIVE NEGATIVE Final    Comment: (NOTE) SARS-CoV-2 target nucleic acids are NOT DETECTED.  The SARS-CoV-2 RNA is generally detectable in upper respiratory specimens during the acute phase of infection. The lowest concentration of SARS-CoV-2 viral copies this assay can detect is 138 copies/mL. A negative result does not preclude SARS-Cov-2 infection and should not be used as the sole basis for treatment or other patient management decisions. A negative result may occur with  improper specimen collection/handling, submission of specimen  other than nasopharyngeal swab, presence of viral mutation(s) within the areas targeted by this assay, and inadequate number of viral copies(<138 copies/mL). A negative result must be combined with clinical observations, patient history, and epidemiological information. The expected result is Negative.  Fact Sheet for Patients:  EntrepreneurPulse.com.au  Fact Sheet for Healthcare Providers:  IncredibleEmployment.be  This test is no t yet approved or cleared by the [place] FDA and  has been authorized for detection and/or diagnosis of SARS-CoV-2 by FDA under an Emergency Use Authorization (EUA). This EUA will remain  in effect (meaning this test can be used) for the duration of the COVID-19 declaration under Section 564(b)(1) of the Act, 21 U.S.C.section 360bbb-3(b)(1), unless the authorization is terminated  or revoked sooner.       Influenza A by PCR NEGATIVE NEGATIVE Final   Influenza B by PCR NEGATIVE NEGATIVE Final    Comment: (NOTE) The Xpert Xpress SARS-CoV-2/FLU/RSV plus assay is intended as an aid in the diagnosis of influenza from Nasopharyngeal swab specimens and should not be used as a sole basis for treatment. Nasal washings and aspirates are unacceptable for Xpert Xpress SARS-CoV-2/FLU/RSV testing.  Fact Sheet for Patients: EntrepreneurPulse.com.au  Fact Sheet for Healthcare Providers: IncredibleEmployment.be  This test is not yet approved or cleared by the [place] FDA and has been authorized for detection and/or diagnosis of SARS-CoV-2 by FDA under an Emergency Use Authorization (EUA). This EUA will remain in effect (meaning this test can be used) for the duration of the COVID-19 declaration under Section 564(b)(1) of the Act, 21 U.S.C. section 360bbb-3(b)(1), unless the authorization is terminated or revoked.  Performed at [facility], [address],  [place], [place] [address]   Urine Culture     Status: Abnormal   Collection Time: 02/03/21  4:15 PM   Specimen: Urine, Clean Catch  Result Value Ref Range Status   Specimen Description   Final    URINE, CLEAN CATCH Performed at [facility], [address] [place], [place] [address]    Special Requests   Final    Normal Performed at [facility], [address] [place], [place] [address]    Culture MULTIPLE SPECIES PRESENT, SUGGEST RECOLLECTION (A)  Final   Report Status 02/05/2021 FINAL  Final         Radiology Studies: No results found.      Scheduled Meds:  atorvastatin  20 mg Oral q1800  carvedilol  3.125 mg Oral BID WC   indocyanine green  1.25 mg Intravenous Once   pantoprazole  40 mg Oral BID   tamsulosin  0.4 mg Oral QPC breakfast   Continuous Infusions:  chlorproMAZINE (THORAZINE) IV 12.5 mg (02/06/21 0913)   lactated ringers 100 mL/hr at 02/05/21 1520     LOS: 5 days    Time spent: 25 minutes    Sidney Ace, MD [facility] Pager [phone]  If 7PM-7AM, please contact night-coverage 02/06/2021, 10:40 AM

## 2021-02-06 NOTE — Evaluation (Signed)
Physical Therapy Evaluation Patient Details Name: James Mora MRN: [id number] DOB: 07-15-1936 Today's Date: 02/06/2021   History of Present Illness  As per EMR: Patient admitted 02/01/2021 with nausea vomiting abdominal pain and diarrhea.  He was diagnosed with acute pancreatitis with gallstones and enteritis seen on CT scan. Patient initially scheduled for cholecystectomy on 6/29.  However in the morning he was complaining of acutely worsened abdominal pain.  Unclear etiology.  Lipase and troponin both reassuring.  Discussed with general surgery. Cholecystectomy on hold for now pending improvement in pain control. Re-attempt procedure on 7/1 but placed on hold again due to hematuria and distending abdomen. Work-up included repeat lipase which is trending down as well as troponin which is not indicative of acute coronary syndrome.   Clinical Impression  Pt admitted with above diagnosis. Pt supine in bed with HOB elevated and spouse in room. Agreeable to PT eval. Reports 5/10 pain in abdomen. Pt requesting to urinate and t/f from supine to seated EOB with minA at pelvis to assist pt getting feet flat on floor and use of bed rail. Post lean noted in sitting with SUE support and able to independently urinate in urinal. Hematuria still present. Pt required 5-10 min of time to finish urination before further mobility attempted. MInA+1 STS with RW. Noted shakiness in LE's and heavy reliance on UE's. Pt amb 10' in room with poor sequencing and navigation in room requiring VC/TC's to assist sequencing of RW and prevent bumping into wall and items in room. Pt seated in recliner with further need of cuing for turning RW and safe hand placement. With walking pt displayed adequate balance with UE support but moves very slow. Pt's baseline per subjective reports is mod-I with SPC with community and household walking tasks requiring no assist from spouse with IADL's. Overall pt is well below baseline mobility and is  deconditioned. Pt currently with functional limitations due to the deficits listed below (see PT Problem List). Pt will benefit from skilled PT to increase their independence and safety with mobility to allow discharge to the venue listed below.       Follow Up Recommendations SNF    Equipment Recommendations  Other (comment) (Next venue of care)    Recommendations for Other Services       Precautions / Restrictions Precautions Precautions: Fall Restrictions Weight Bearing Restrictions: No      Mobility  Bed Mobility Overal bed mobility: Needs Assistance Bed Mobility: Supine to Sit     Supine to sit: HOB elevated;Min guard     General bed mobility comments: Received supine in bed and returned to recliner.    Transfers Overall transfer level: Needs assistance Equipment used: Rolling walker (2 wheeled) Transfers: Sit to/from Stand Sit to Stand: Min assist         General transfer comment: VC's for hand placement.  Ambulation/Gait Ambulation/Gait assistance: Min guard Gait Distance (Feet): 10 Feet Assistive device: Rolling walker (2 wheeled) Gait Pattern/deviations: Shuffle     General Gait Details: Poor awareness of RW navigating inside room. Initially shaky in LE's. Slow gait speed.  Stairs            Wheelchair Mobility    Modified Rankin (Stroke Patients Only)       Balance  Pertinent Vitals/Pain Pain Assessment: 0-10 Pain Score: 5  Pain Location: RLQ that refers to LLQ Pain Intervention(s): Monitored during session;Repositioned    Home Living Family/patient expects to be discharged to:: Private residence Living Arrangements: Spouse/significant other Available Help at Discharge: Family Type of Home: House Home Access: Stairs to enter Entrance Stairs-Rails: Right Entrance Stairs-Number of Steps: 1 Home Layout: One level Home Equipment: Cane - single point;Walker - 2  wheels      Prior Function Level of Independence: Independent         Comments: Uses SPC for community ambulation     Hand Dominance        Extremity/Trunk Assessment   Upper Extremity Assessment Upper Extremity Assessment: Overall WFL for tasks assessed    Lower Extremity Assessment Lower Extremity Assessment: Generalized weakness RLE Sensation: WNL    Cervical / Trunk Assessment Cervical / Trunk Assessment: Normal  Communication   Communication: No difficulties  Cognition Arousal/Alertness: Awake/alert Behavior During Therapy: WFL for tasks assessed/performed Overall Cognitive Status: Difficult to assess                               Problem Solving: Requires verbal cues;Requires tactile cues;Difficulty sequencing General Comments: Difficulty with navigating  and sequencing RW within Hospital.      General Comments      Exercises Other Exercises Other Exercises: standing marches at [employer] x10/LE   Assessment/Plan    PT Assessment Patient needs continued PT services  PT Problem List Decreased strength;Decreased knowledge of use of DME;Decreased activity tolerance;Decreased balance;Pain;Decreased mobility       PT Treatment Interventions DME instruction;Balance training;Neuromuscular re-education;Stair training;Functional mobility training;Patient/family education;Therapeutic activities;Therapeutic exercise    PT Goals (Current goals can be found in the Care Plan section)  Acute Rehab PT Goals Patient Stated Goal: go home PT Goal Formulation: With patient/family Time For Goal Achievement: 02/20/21 Potential to Achieve Goals: Good    Frequency Min 2X/week   Barriers to discharge        Co-evaluation               AM-PAC PT "6 Clicks" Mobility  Outcome Measure Help needed turning from your back to your side while in a flat bed without using bedrails?: A Lot Help needed moving from lying on your back to sitting on the side of a  flat bed without using bedrails?: A Lot Help needed moving to and from a bed to a chair (including a wheelchair)?: A Little Help needed standing up from a chair using your arms (e.g., wheelchair or bedside chair)?: A Little Help needed to walk in hospital room?: A Little Help needed climbing 3-5 steps with a railing? : A Lot 6 Click Score: 15    End of Session Equipment Utilized During Treatment: Gait belt Activity Tolerance: Patient tolerated treatment well Patient left: in chair;with call bell/phone within reach;with family/visitor present Nurse Communication: Mobility status PT Visit Diagnosis: Unsteadiness on feet (R26.81);Difficulty in walking, not elsewhere classified (R26.2);Muscle weakness (generalized) (M62.81)    Time: 5462-7035 PT Time Calculation (min) (ACUTE ONLY): 32 min   Charges:   PT Evaluation $PT Eval Low Complexity: 1 Low PT Treatments $Therapeutic Activity: 8-22 mins        Murphy Duzan M. Fairly IV, PT, DPT Physical Therapist- [facility]  02/06/2021, 12:37 PM

## 2021-02-07 ENCOUNTER — Inpatient Hospital Stay: Payer: Medicare Other

## 2021-02-07 LAB — CBC WITH DIFFERENTIAL/PLATELET
Abs Immature Granulocytes: 0.08 10*3/uL — ABNORMAL HIGH (ref 0.00–0.07)
Basophils Absolute: 0 10*3/uL (ref 0.0–0.1)
Basophils Relative: 0 %
Eosinophils Absolute: 0 10*3/uL (ref 0.0–0.5)
Eosinophils Relative: 0 %
HCT: 32.6 % — ABNORMAL LOW (ref 39.0–52.0)
Hemoglobin: 11.1 g/dL — ABNORMAL LOW (ref 13.0–17.0)
Immature Granulocytes: 1 %
Lymphocytes Relative: 5 %
Lymphs Abs: 0.6 10*3/uL — ABNORMAL LOW (ref 0.7–4.0)
MCH: 31.2 pg (ref 26.0–34.0)
MCHC: 34 g/dL (ref 30.0–36.0)
MCV: 91.6 fL (ref 80.0–100.0)
Monocytes Absolute: 0.8 10*3/uL (ref 0.1–1.0)
Monocytes Relative: 6 %
Neutro Abs: 12 10*3/uL — ABNORMAL HIGH (ref 1.7–7.7)
Neutrophils Relative %: 88 %
Platelets: 166 10*3/uL (ref 150–400)
RBC: 3.56 MIL/uL — ABNORMAL LOW (ref 4.22–5.81)
RDW: 14 % (ref 11.5–15.5)
WBC: 13.6 10*3/uL — ABNORMAL HIGH (ref 4.0–10.5)
nRBC: 0 % (ref 0.0–0.2)

## 2021-02-07 LAB — COMPREHENSIVE METABOLIC PANEL
ALT: 13 U/L (ref 0–44)
AST: 18 U/L (ref 15–41)
Albumin: 2.2 g/dL — ABNORMAL LOW (ref 3.5–5.0)
Alkaline Phosphatase: 59 U/L (ref 38–126)
Anion gap: 11 (ref 5–15)
BUN: 34 mg/dL — ABNORMAL HIGH (ref 8–23)
CO2: 26 mmol/L (ref 22–32)
Calcium: 7.8 mg/dL — ABNORMAL LOW (ref 8.9–10.3)
Chloride: 101 mmol/L (ref 98–111)
Creatinine, Ser: 1.46 mg/dL — ABNORMAL HIGH (ref 0.61–1.24)
GFR, Estimated: 48 mL/min — ABNORMAL LOW (ref >60)
Glucose, Bld: 92 mg/dL (ref 70–99)
Potassium: 3.1 mmol/L — ABNORMAL LOW (ref 3.5–5.1)
Sodium: 138 mmol/L (ref 135–145)
Total Bilirubin: 2.1 mg/dL — ABNORMAL HIGH (ref 0.3–1.2)
Total Protein: 5.5 g/dL — ABNORMAL LOW (ref 6.5–8.1)

## 2021-02-07 MED ORDER — POTASSIUM CHLORIDE CRYS ER 20 MEQ PO TBCR
40.0000 meq | EXTENDED_RELEASE_TABLET | Freq: Once | ORAL | Status: AC
  Administered 2021-02-07: 11:00:00 40 meq via ORAL
  Filled 2021-02-07: qty 2

## 2021-02-07 NOTE — Progress Notes (Signed)
PROGRESS NOTE    James Mora  DXA:[id number] DOB: [birth year] DOA: 02/01/2021 PCP: [name], PA-C   Brief Narrative:  Patient admitted 02/01/2021 with nausea vomiting abdominal pain and diarrhea.  He was diagnosed with acute pancreatitis with gallstones and enteritis seen on CT scan.  Patient was started on PPI therapy.  General surgery and gastroenterology consulted.  Lipase has trended better and general surgery will talk to the patient about removing the gallbladder.  Patient was initially scheduled for cholecystectomy on 6/29.  However in the morning he was complaining of acutely worsened abdominal pain.  Unclear etiology.  Lipase and troponin both reassuring.  Discussed with general surgery.  Cholecystectomy on hold for now pending improvement in pain control.  Work-up included repeat lipase which is trending down as well as troponin which is not indicative of acute coronary syndrome.  On 7/1 patient again rescheduled for cholecystectomy however he has increasing abdominal pain with increasing work of breathing.  Abdomen is mildly distended.  Discussed with general surgery and cholecystectomy on hold and likely will be deferred to outpatient in 4 to 6 weeks.  We will pursue CT abdomen pelvis to evaluate for possible pancreatic fluid collection versus on pancreatitis related ileus  7/2: Abdominal pain persistent.  CT abdomen pelvis with worsening peripancreatic edema consistent with persistent pancreatitis.  Surgical plan on hold.  Will not pursue surgical intervention during this admission.  Patient also endorsing some shortness of breath and exam notable for end expiratory wheeze.  Bibasilar crackles noted on exam.   Assessment & Plan:   Active Problems:   Benign prostatic hyperplasia without lower urinary tract symptoms   Acute pancreatitis   Gallstones   Enteritis   Gallstone pancreatitis   Shortness of breath  Acute gallstone pancreatitis Lipase trending down, down to  60 as of 6/29 Patient had recurrence of pain on 6/29 Unclear etiology, possibly pancreatitis versus peritonitis versus passed stone Continues to have pain, rated as of 4/10 on 6/30 Leukocytosis stable Increasing abdominal pain associated with distention CT abdomen pelvis, worsening peripancreatic edema Plan: No plans for surgery during this admission.  Patient can advance to liquid diet once abdominal pain is improved.  Continue IV fluid resuscitation for now.  Bowel rest.  Pain control as needed.  Acute hypoxic respiratory failure Patient endorsing dyspnea upon exertion.  New oxygen requirement 2 L nasal cannula.  I suspect this is secondary to atelectasis versus evolving pulmonary edema in the setting of aggressive IV fluid resuscitation. Plan: Incentive. spirometry use 4X per hour Check portable chest x-ray Will consider small dose of Lasix if there is significant edema on chest imaging  Acute kidney injury on chronic kidney disease stage IIIa Baseline creatinine 1.64 Creatinine peaked at 2.23, subsequently downtrending 1.55 as of 7/1 No change despite IV contrast exposure on 7/1 Plan: IVF as above Avoid nonessential nephrotoxins  Acute enteritis on CAT scan Suspect secondary inflammation from acute pancreatitis Continue IV PPI and pain control  History of coronary disease Hyperlipidemia Echocardiogram reassuring No further inpatient cardiac diagnostics Plan: Continue Coreg and atorvastatin Continue telemetry monitoring  Essential hypertension Well-controlled over interval Continue Coreg and enalapril  GERD PPI  BPH Flomax   DVT prophylaxis: SCDs Code Status: Full Family Communication: Wife at bedside 6/29, 6/30, 7/1, 7/2 Disposition Plan: Status is: Inpatient  Remains inpatient appropriate because:Inpatient level of care appropriate due to severity of illness  Dispo: The patient is from: Home  Anticipated d/c is to: Home              Patient  currently is not medically stable to d/c.   Difficult to place patient No  Acute gallstone pancreatitis.  Worsening peripancreatic edema concerning for persistent inflammation.  No plans for surgery.  Medical management.     Level of care: Med-Surg  Consultants:  General surgery Cardiology  Procedures:  None  Antimicrobials:  None   Subjective: Patient seen and examined.  Continues to endorse abdominal pain 4/10 in severity and dyspnea on exertion  Objective: Vitals:   02/07/21 0449 02/07/21 0452 02/07/21 0620 02/07/21 0745  BP:   (!) 160/81 (!) 159/85  Pulse: 92 96 91 92  Resp:   16 17  Temp:   99.3 F (37.4 C) 98.8 F (37.1 C)  TempSrc:   Oral Oral  SpO2: 90% 96% 97% 99%  Weight:      Height:        Intake/Output Summary (Last 24 hours) at 02/07/2021 1017 Last data filed at 02/07/2021 0900 Gross per 24 hour  Intake 1957.88 ml  Output 1150 ml  Net 807.88 ml   Filed Weights   02/01/21 1644 02/01/21 2319  Weight: 74.8 kg 73.5 kg    Examination:  General exam: Mild distress due to tachypnea and pain Respiratory system: Bibasilar crackles.  Normal work of breathing.  2 L Cardiovascular system: S1-S2, tachycardic, no murmurs, no pedal edema  gastrointestinal system: Soft, mildly distended, tender to palpation diffusely Central nervous system: Alert and oriented. No focal neurological deficits. Extremities: Symmetric 5 x 5 power. Skin: No rashes, lesions or ulcers Psychiatry: Judgement and insight appear normal. Mood & affect appropriate.     Data Reviewed: I have personally reviewed following labs and imaging studies  CBC: Recent Labs  Lab 02/02/21 0448 02/04/21 0445 02/05/21 0405 02/06/21 0455 02/07/21 0607  WBC 13.8* 12.1* 11.9* 12.0* 13.6*  NEUTROABS  --  10.8* 10.7* 10.4* 12.0*  HGB 15.0 11.7* 11.2* 11.0* 11.1*  HCT 44.8 35.8* 32.3* 32.8* 32.6*  MCV 91.1 93.2 90.5 92.4 91.6  PLT 162 120* 127* 134* 086   Basic Metabolic Panel: Recent Labs   Lab 02/01/21 1658 02/02/21 0448 02/03/21 0420 02/04/21 0445 02/05/21 0405 02/06/21 0455 02/07/21 0607  NA 139   < > 140 138 138 137 138  K 4.1   < > 3.6 3.5 3.5 3.1* 3.1*  CL 108   < > 107 104 103 102 101  CO2 21*   < > 24 25 28 28 26   GLUCOSE 184*   < > 103* 95 96 103* 92  BUN 31*   < > 46* 50* 45* 38* 34*  CREATININE 1.78*   < > 2.23* 2.21* 1.82* 1.55* 1.46*  CALCIUM 9.1   < > 8.0* 7.8* 7.7* 7.7* 7.8*  MG 1.7  --   --   --   --   --   --    < > = values in this interval not displayed.   GFR: Estimated Creatinine Clearance: 40.3 mL/min (A) (by C-G formula based on SCr of 1.46 mg/dL (H)). Liver Function Tests: Recent Labs  Lab 02/03/21 0420 02/04/21 0445 02/05/21 0405 02/06/21 0455 02/07/21 0607  AST 29 18 16 18 18   ALT 8 8 10 11 13   ALKPHOS 56 50 52 54 59  BILITOT 1.7* 1.7* 1.7* 1.6* 2.1*  PROT 6.2* 5.9* 5.8* 5.5* 5.5*  ALBUMIN 3.0* 2.7* 2.5* 2.3* 2.2*   Recent Labs  Lab 02/01/21 1658 02/02/21 0448 02/03/21 0420 02/04/21 0445  LIPASE 1,666* 713* 184* 60*   No results for input(s): AMMONIA in the last 168 hours. Coagulation Profile: No results for input(s): INR, PROTIME in the last 168 hours. Cardiac Enzymes: No results for input(s): CKTOTAL, CKMB, CKMBINDEX, TROPONINI in the last 168 hours. BNP (last 3 results) No results for input(s): PROBNP in the last 8760 hours. HbA1C: No results for input(s): HGBA1C in the last 72 hours. CBG: No results for input(s): GLUCAP in the last 168 hours. Lipid Profile: No results for input(s): CHOL, HDL, LDLCALC, TRIG, CHOLHDL, LDLDIRECT in the last 72 hours.  Thyroid Function Tests: No results for input(s): TSH, T4TOTAL, FREET4, T3FREE, THYROIDAB in the last 72 hours. Anemia Panel: No results for input(s): VITAMINB12, FOLATE, FERRITIN, TIBC, IRON, RETICCTPCT in the last 72 hours. Sepsis Labs: No results for input(s): PROCALCITON, LATICACIDVEN in the last 168 hours.  Recent Results (from the past 240 hour(s))  Resp  Panel by RT-PCR (Flu A&B, Covid) Nasopharyngeal Swab     Status: None   Collection Time: 02/01/21  5:54 PM   Specimen: Nasopharyngeal Swab; Nasopharyngeal(NP) swabs in vial transport medium  Result Value Ref Range Status   SARS Coronavirus 2 by RT PCR NEGATIVE NEGATIVE Final    Comment: (NOTE) SARS-CoV-2 target nucleic acids are NOT DETECTED.  The SARS-CoV-2 RNA is generally detectable in upper respiratory specimens during the acute phase of infection. The lowest concentration of SARS-CoV-2 viral copies this assay can detect is 138 copies/mL. A negative result does not preclude SARS-Cov-2 infection and should not be used as the sole basis for treatment or other patient management decisions. A negative result may occur with  improper specimen collection/handling, submission of specimen other than nasopharyngeal swab, presence of viral mutation(s) within the areas targeted by this assay, and inadequate number of viral copies(<138 copies/mL). A negative result must be combined with clinical observations, patient history, and epidemiological information. The expected result is Negative.  Fact Sheet for Patients:  EntrepreneurPulse.com.au  Fact Sheet for Healthcare Providers:  IncredibleEmployment.be  This test is no t yet approved or cleared by the [place] FDA and  has been authorized for detection and/or diagnosis of SARS-CoV-2 by FDA under an Emergency Use Authorization (EUA). This EUA will remain  in effect (meaning this test can be used) for the duration of the COVID-19 declaration under Section 564(b)(1) of the Act, 21 U.S.C.section 360bbb-3(b)(1), unless the authorization is terminated  or revoked sooner.       Influenza A by PCR NEGATIVE NEGATIVE Final   Influenza B by PCR NEGATIVE NEGATIVE Final    Comment: (NOTE) The Xpert Xpress SARS-CoV-2/FLU/RSV plus assay is intended as an aid in the diagnosis of influenza from Nasopharyngeal  swab specimens and should not be used as a sole basis for treatment. Nasal washings and aspirates are unacceptable for Xpert Xpress SARS-CoV-2/FLU/RSV testing.  Fact Sheet for Patients: EntrepreneurPulse.com.au  Fact Sheet for Healthcare Providers: IncredibleEmployment.be  This test is not yet approved or cleared by the [place] FDA and has been authorized for detection and/or diagnosis of SARS-CoV-2 by FDA under an Emergency Use Authorization (EUA). This EUA will remain in effect (meaning this test can be used) for the duration of the COVID-19 declaration under Section 564(b)(1) of the Act, 21 U.S.C. section 360bbb-3(b)(1), unless the authorization is terminated or revoked.  Performed at [facility], [address], [place], [place] [address]   Urine Culture     Status: Abnormal  Collection Time: 02/03/21  4:15 PM   Specimen: Urine, Clean Catch  Result Value Ref Range Status   Specimen Description   Final    URINE, CLEAN CATCH Performed at [facility], [address] [place], [place] [address]    Special Requests   Final    Normal Performed at [facility], [address] [place], [place] [address]    Culture MULTIPLE SPECIES PRESENT, SUGGEST RECOLLECTION (A)  Final   Report Status 02/05/2021 FINAL  Final         Radiology Studies: CT ABDOMEN PELVIS W CONTRAST  Result Date: 02/06/2021 CLINICAL DATA:  Pancreatitis. EXAM: CT ABDOMEN AND PELVIS WITH CONTRAST TECHNIQUE: Multidetector CT imaging of the abdomen and pelvis was performed using the standard protocol following bolus administration of intravenous contrast. CONTRAST:  42mL OMNIPAQUE IOHEXOL 300 MG/ML  SOLN COMPARISON:  February 01, 2021. FINDINGS: Lower chest: Small bilateral pleural effusions are noted with adjacent subsegmental atelectasis. Hepatobiliary: Cholelithiasis is noted. No biliary dilatation is noted. The liver is unremarkable.  Pancreas: Significantly increased inflammation is seen involving the pancreatic head and body consistent with worsening pancreatitis. No defined pseudocyst formation is seen at this time. Spleen: Normal in size without focal abnormality. Adrenals/Urinary Tract: Adrenal glands appear normal. Bilateral renal cysts are noted. No hydronephrosis or renal obstruction is noted. No renal or ureteral calculi are noted. Urinary bladder is unremarkable. Stomach/Bowel: The stomach appears normal. Wall and fold thickening of the duodenum is noted which most likely is secondary to adjacent pancreatitis. There is no evidence of bowel obstruction. Sigmoid diverticulosis is noted without inflammation. Vascular/Lymphatic: Aortic atherosclerosis. No enlarged abdominal or pelvic lymph nodes. Reproductive: Moderate prostatic enlargement is noted. Other: Small bilateral fat containing inguinal hernias are noted. Mild amount of free fluid is noted in the pelvis and right pericolic gutter. Musculoskeletal: No acute or significant osseous findings. IMPRESSION: Significantly increased inflammatory stranding is noted around the pancreatic head and body consistent with worsening pancreatitis. No definite pseudocyst formation is seen at this time. Wall and fold thickening of the duodenum is noted which most likely is secondary to adjacent pancreatitis. Small bilateral pleural effusions are noted with adjacent subsegmental atelectasis. Mild amount of free fluid is noted in the pelvis and right pericolic gutter, most likely related to pancreatitis. Moderate prostatic enlargement. Aortic Atherosclerosis (ICD10-I70.0). Electronically Signed   By: Marijo Conception M.D.   On: 02/06/2021 15:58        Scheduled Meds:  atorvastatin  20 mg Oral q1800   carvedilol  3.125 mg Oral BID WC   indocyanine green  1.25 mg Intravenous Once   pantoprazole  40 mg Oral BID   potassium chloride  40 mEq Oral Once   tamsulosin  0.4 mg Oral QPC breakfast    Continuous Infusions:  chlorproMAZINE (THORAZINE) IV Stopped (02/06/21 0943)   lactated ringers 125 mL/hr at 02/07/21 0621     LOS: 6 days    Time spent: 25 minutes    Sidney Ace, MD [facility] Pager [phone]  If 7PM-7AM, please contact night-coverage 02/07/2021, 10:17 AM

## 2021-02-07 NOTE — Progress Notes (Signed)
Patient ID: James Mora, male   DOB: [birth year], 85 y.o.   MRN: [id number]     [place] [facility] Day(s): 6.   Interval History: Patient seen and examined, no acute events or new complaints overnight. Patient reports continued having intermittent abdominal pain.  This morning when he woke up the pain was 2 out of 10.  During my evaluation the patient was increasing to 5 out of 10.  Patient received pain medication and is slowly getting better again.  Denies nausea or vomiting.  Vital signs in last 24 hours: [min-max] current  Temp:  [98.2 F (36.8 C)-99.3 F (37.4 C)] 99.3 F (37.4 C) (07/02 0620) Pulse Rate:  [91-103] 91 (07/02 0620) Resp:  [16-20] 16 (07/02 0620) BP: (140-161)/(69-89) 160/81 (07/02 0620) SpO2:  [89 %-97 %] 97 % (07/02 0620)     Height: 5\' 10"  (177.8 cm) Weight: 73.5 kg BMI (Calculated): 23.25   Physical Exam:  Constitutional: alert, cooperative and no distress  Respiratory: breathing non-labored at rest  Cardiovascular: regular rate and sinus rhythm  Gastrointestinal: soft, mild-tender on the epigastric area, and non-distended   Imaging studies: No new pertinent imaging studies   Assessment/Plan:  85 y.o. male with gallstone pancreatitis, complicated by pertinent comorbidities including hypertension, coronary artery disease, aortic insufficiency.  Patient without any clinical deterioration.  There has been no fever.  Patient continue with intermittent epigastric pain.  When pain improves patient can be started to liquid diet.  As discussed yesterday with family no surgical management will be considered during this admission.  Patient also endorses getting shortness of breath upon short distance walking.  I will defer further work-up and management by hospitalist.  Bilirubin today trending up.  Last evaluation with hepatic function panel showed an mostly was indirect but if he continue trending up will recommend to evaluate direct versus indirect  components.  No increase in creatinine despite CT scan with contrast.  Continue adequate hydration and management as per admitting physician.  I encouraged the patient to ambulate.  Arnold Long, MD

## 2021-02-08 ENCOUNTER — Other Ambulatory Visit: Payer: Self-pay | Admitting: Cardiovascular Disease

## 2021-02-08 ENCOUNTER — Inpatient Hospital Stay: Payer: Medicare Other

## 2021-02-08 LAB — COMPREHENSIVE METABOLIC PANEL
ALT: 13 U/L (ref 0–44)
AST: 20 U/L (ref 15–41)
Albumin: 2 g/dL — ABNORMAL LOW (ref 3.5–5.0)
Alkaline Phosphatase: 60 U/L (ref 38–126)
Anion gap: 5 (ref 5–15)
BUN: 33 mg/dL — ABNORMAL HIGH (ref 8–23)
CO2: 30 mmol/L (ref 22–32)
Calcium: 7.7 mg/dL — ABNORMAL LOW (ref 8.9–10.3)
Chloride: 106 mmol/L (ref 98–111)
Creatinine, Ser: 1.39 mg/dL — ABNORMAL HIGH (ref 0.61–1.24)
GFR, Estimated: 51 mL/min — ABNORMAL LOW (ref >60)
Glucose, Bld: 90 mg/dL (ref 70–99)
Potassium: 3.4 mmol/L — ABNORMAL LOW (ref 3.5–5.1)
Sodium: 141 mmol/L (ref 135–145)
Total Bilirubin: 2.4 mg/dL — ABNORMAL HIGH (ref 0.3–1.2)
Total Protein: 5 g/dL — ABNORMAL LOW (ref 6.5–8.1)

## 2021-02-08 LAB — CBC WITH DIFFERENTIAL/PLATELET
Abs Immature Granulocytes: 0.13 10*3/uL — ABNORMAL HIGH (ref 0.00–0.07)
Basophils Absolute: 0 10*3/uL (ref 0.0–0.1)
Basophils Relative: 0 %
Eosinophils Absolute: 0 10*3/uL (ref 0.0–0.5)
Eosinophils Relative: 0 %
HCT: 32.2 % — ABNORMAL LOW (ref 39.0–52.0)
Hemoglobin: 10.8 g/dL — ABNORMAL LOW (ref 13.0–17.0)
Immature Granulocytes: 1 %
Lymphocytes Relative: 3 %
Lymphs Abs: 0.5 10*3/uL — ABNORMAL LOW (ref 0.7–4.0)
MCH: 30.4 pg (ref 26.0–34.0)
MCHC: 33.5 g/dL (ref 30.0–36.0)
MCV: 90.7 fL (ref 80.0–100.0)
Monocytes Absolute: 0.9 10*3/uL (ref 0.1–1.0)
Monocytes Relative: 5 %
Neutro Abs: 15 10*3/uL — ABNORMAL HIGH (ref 1.7–7.7)
Neutrophils Relative %: 91 %
Platelets: 184 10*3/uL (ref 150–400)
RBC: 3.55 MIL/uL — ABNORMAL LOW (ref 4.22–5.81)
RDW: 14 % (ref 11.5–15.5)
WBC: 16.7 10*3/uL — ABNORMAL HIGH (ref 4.0–10.5)
nRBC: 0 % (ref 0.0–0.2)

## 2021-02-08 LAB — BILIRUBIN, FRACTIONATED(TOT/DIR/INDIR)
Bilirubin, Direct: 1.1 mg/dL — ABNORMAL HIGH (ref 0.0–0.2)
Indirect Bilirubin: 1.3 mg/dL — ABNORMAL HIGH (ref 0.3–0.9)
Total Bilirubin: 2.4 mg/dL — ABNORMAL HIGH (ref 0.3–1.2)

## 2021-02-08 LAB — LIPASE, BLOOD: Lipase: 19 U/L (ref 11–51)

## 2021-02-08 MED ORDER — GADOBUTROL 1 MMOL/ML IV SOLN
7.0000 mL | Freq: Once | INTRAVENOUS | Status: AC | PRN
  Administered 2021-02-08: 13:00:00 7.5 mL via INTRAVENOUS

## 2021-02-08 MED ORDER — HYDROMORPHONE HCL 1 MG/ML IJ SOLN
0.5000 mg | INTRAMUSCULAR | Status: DC | PRN
  Administered 2021-02-09 – 2021-02-10 (×2): 0.5 mg via INTRAVENOUS
  Filled 2021-02-08 (×2): qty 1

## 2021-02-08 NOTE — Progress Notes (Signed)
Patient ID: James Mora, male   DOB: [birth year], 85 y.o.   MRN: [id number]     [place] [facility] Day(s): 7.   Interval History: Patient seen and examined, no acute events or new complaints overnight. Patient reports continued having persistent epigastric pain.  Pain radiated to the back.  There has been no alleviating or aggravating factor.  Pain minimally improved with pain medication.  Patient today without fever.  Vital signs are stable.  Labs shows elevated bilirubin level with increased direct component.  I placed normal   Vital signs in last 24 hours: [min-max] current  Temp:  [98.6 F (37 C)-99.6 F (37.6 C)] 98.8 F (37.1 C) (07/03 0730) Pulse Rate:  [87-95] 87 (07/03 0730) Resp:  [15-18] 16 (07/03 0730) BP: (145-156)/(78-81) 156/78 (07/03 0730) SpO2:  [96 %-100 %] 98 % (07/03 0730)     Height: 5\' 10"  (177.8 cm) Weight: 73.5 kg BMI (Calculated): 23.25   Physical Exam:  Constitutional: alert, cooperative and no distress  Respiratory: breathing non-labored at rest  Cardiovascular: regular rate and sinus rhythm  Gastrointestinal: soft, mild-tender, and non-distended   Imaging studies: No new pertinent imaging studies   Assessment/Plan:  85 y.o. male with gallstone pancreatitis, complicated by pertinent comorbidities including hypertension, coronary artery disease, aortic insufficiency.  Patient continue with persistent abdominal pain.  No fever, stable vital signs.  No acute abdomen.  Due to the persistent abdominal pain and the increase in total bilirubin with increased direct component I order MRCP.  We will need to rule out choledocholithiasis.  Lipase normal.  Persistent pain may be due to his pancreatitis that has now resolved despite normal lipase versus choledocholithiasis.  We will keep him n.p.o. until MRCP is done and/or if pain persist.  We will continue to follow.  Arnold Long, MD

## 2021-02-08 NOTE — Progress Notes (Signed)
PROGRESS NOTE    James Mora  HQR:[id number] DOB: 08/24/1935 DOA: 02/01/2021 PCP: [name], PA-C   Brief Narrative:  Patient admitted 02/01/2021 with nausea vomiting abdominal pain and diarrhea.  He was diagnosed with acute pancreatitis with gallstones and enteritis seen on CT scan.  Patient was started on PPI therapy.  General surgery and gastroenterology consulted.  Lipase has trended better and general surgery will talk to the patient about removing the gallbladder.  Patient was initially scheduled for cholecystectomy on 6/29.  However in the morning he was complaining of acutely worsened abdominal pain.  Unclear etiology.  Lipase and troponin both reassuring.  Discussed with general surgery.  Cholecystectomy on hold for now pending improvement in pain control.  Work-up included repeat lipase which is trending down as well as troponin which is not indicative of acute coronary syndrome.  On 7/1 patient again rescheduled for cholecystectomy however he has increasing abdominal pain with increasing work of breathing.  Abdomen is mildly distended.  Discussed with general surgery and cholecystectomy on hold and likely will be deferred to outpatient in 4 to 6 weeks.  We will pursue CT abdomen pelvis to evaluate for possible pancreatic fluid collection versus on pancreatitis related ileus  7/2: Abdominal pain persistent.  CT abdomen pelvis with worsening peripancreatic edema consistent with persistent pancreatitis.  Surgical plan on hold.  Will not pursue surgical intervention during this admission.  Patient also endorsing some shortness of breath and exam notable for end expiratory wheeze.  Bibasilar crackles noted on exam.  7/3: Mildly increased bilirubin.  Patient remained stable however uptrending leukocytosis and persistent abdominal pain.  General surgery ordered MRCP.   Assessment & Plan:   Active Problems:   Benign prostatic hyperplasia without lower urinary tract symptoms    Acute pancreatitis   Gallstones   Enteritis   Gallstone pancreatitis   Shortness of breath  Acute gallstone pancreatitis Lipase trending down, down to 60 as of 6/29 Patient had recurrence of pain on 6/29 Unclear etiology, possibly pancreatitis versus peritonitis versus passed stone Continues to have pain, rated as of 4/10 on 6/30 Leukocytosis stable Increasing abdominal pain associated with distention CT abdomen pelvis, worsening peripancreatic edema Plan: Increasing bilirubin raises concerns for choledocholithiasis.  MRCP ordered by general surgery.  We will follow-up on results.  N.p.o. for now pending results of MRCP and or improvement in abdominal pain  Acute hypoxic respiratory failure Patient endorsing seeing dyspnea on exertion.  New oxygen requirement 2L.  Chest x-ray ordered on 7/2 demonstrates bibasilar atelectasis without edema or effusion.  I suspect this is secondary to splinting from pain. Plan: Encourage incentive spirometry use 4X per hour.  Hold Lasix as no significant edema noted on chest x-ray.  Encourage ambulation and exertion as much as tolerated.  Hematuria Noted on 7/1.  Resolved spontaneously without intervention.  No stones noted on CAT scan though this was done with imaging.  May be a passed stone. Hemoglobin has been stable Plan: Monitor UOP and daily CBC If patient starts to pass clots or hematuria becomes persistent we will involve urology and consider CBI  Acute kidney injury on chronic kidney disease stage IIIa Baseline creatinine 1.64 Creatinine peaked at 2.23, subsequently downtrending 1.55 as of 7/1 No change despite IV contrast exposure on 7/1 Plan: IVF as above Avoid nonessential nephrotoxins  Acute enteritis on CAT scan Suspect secondary inflammation from acute pancreatitis Continue IV PPI and pain control  History of coronary disease Hyperlipidemia Echocardiogram reassuring No further inpatient cardiac  diagnostics Plan: Continue  Coreg and atorvastatin Continue telemetry monitoring  Essential hypertension Well-controlled over interval Continue Coreg and enalapril  GERD PPI  BPH Flomax   DVT prophylaxis: SCDs Code Status: Full Family Communication: Wife at bedside 6/29, 6/30, 7/1, 7/2, 7/3 Disposition Plan: Status is: Inpatient  Remains inpatient appropriate because:Inpatient level of care appropriate due to severity of illness  Dispo: The patient is from: Home              Anticipated d/c is to: Home              Patient currently is not medically stable to d/c.   Difficult to place patient No  Acute gallstone pancreatitis with associated uptrending bilirubin.  Raises concern for choledocholithiasis.  MRCP ordered.     Level of care: Med-Surg  Consultants:  General surgery Cardiology  Procedures:  None  Antimicrobials:  None   Subjective: Patient seen and examined.  Continues to endorse abdominal pain 4/10 in severity and dyspnea on exertion  Objective: Vitals:   02/07/21 1704 02/07/21 2016 02/08/21 0528 02/08/21 0730  BP: (!) 147/81 (!) 145/80 (!) 146/78 (!) 156/78  Pulse: 93 92 92 87  Resp: 15 16 16 16   Temp: 98.6 F (37 C) 99.6 F (37.6 C) 98.7 F (37.1 C) 98.8 F (37.1 C)  TempSrc:  Oral Oral   SpO2: 97% 96% 98% 98%  Weight:      Height:        Intake/Output Summary (Last 24 hours) at 02/08/2021 1024 Last data filed at 02/08/2021 0219 Gross per 24 hour  Intake 20 ml  Output 475 ml  Net -455 ml   Filed Weights   02/01/21 1644 02/01/21 2319  Weight: 74.8 kg 73.5 kg    Examination:  General exam: Mild distress due to pain.  Appears fatigued Respiratory system: Bibasilar crackles.  Normal work of breathing.  2 L Cardiovascular system: S1-S2, tachycardic, no murmurs, no pedal edema  gastrointestinal system: Soft, mildly distended, epigastric tenderness Central nervous system: Alert and oriented. No focal neurological deficits. Extremities: Symmetric 5 x 5  power. Skin: No rashes, lesions or ulcers Psychiatry: Judgement and insight appear normal. Mood & affect appropriate.     Data Reviewed: I have personally reviewed following labs and imaging studies  CBC: Recent Labs  Lab 02/04/21 0445 02/05/21 0405 02/06/21 0455 02/07/21 0607 02/08/21 0420  WBC 12.1* 11.9* 12.0* 13.6* 16.7*  NEUTROABS 10.8* 10.7* 10.4* 12.0* 15.0*  HGB 11.7* 11.2* 11.0* 11.1* 10.8*  HCT 35.8* 32.3* 32.8* 32.6* 32.2*  MCV 93.2 90.5 92.4 91.6 90.7  PLT 120* 127* 134* 166 573   Basic Metabolic Panel: Recent Labs  Lab 02/01/21 1658 02/02/21 0448 02/04/21 0445 02/05/21 0405 02/06/21 0455 02/07/21 0607 02/08/21 0420  NA 139   < > 138 138 137 138 141  K 4.1   < > 3.5 3.5 3.1* 3.1* 3.4*  CL 108   < > 104 103 102 101 106  CO2 21*   < > 25 28 28 26 30   GLUCOSE 184*   < > 95 96 103* 92 90  BUN 31*   < > 50* 45* 38* 34* 33*  CREATININE 1.78*   < > 2.21* 1.82* 1.55* 1.46* 1.39*  CALCIUM 9.1   < > 7.8* 7.7* 7.7* 7.8* 7.7*  MG 1.7  --   --   --   --   --   --    < > = values in this interval not  displayed.   GFR: Estimated Creatinine Clearance: 42.3 mL/min (A) (by C-G formula based on SCr of 1.39 mg/dL (H)). Liver Function Tests: Recent Labs  Lab 02/04/21 0445 02/05/21 0405 02/06/21 0455 02/07/21 0607 02/08/21 0420  AST 18 16 18 18 20   ALT 8 10 11 13 13   ALKPHOS 50 52 54 59 60  BILITOT 1.7* 1.7* 1.6* 2.1* 2.4*  2.4*  PROT 5.9* 5.8* 5.5* 5.5* 5.0*  ALBUMIN 2.7* 2.5* 2.3* 2.2* 2.0*   Recent Labs  Lab 02/01/21 1658 02/02/21 0448 02/03/21 0420 02/04/21 0445 02/08/21 0420  LIPASE 1,666* 713* 184* 60* 19   No results for input(s): AMMONIA in the last 168 hours. Coagulation Profile: No results for input(s): INR, PROTIME in the last 168 hours. Cardiac Enzymes: No results for input(s): CKTOTAL, CKMB, CKMBINDEX, TROPONINI in the last 168 hours. BNP (last 3 results) No results for input(s): PROBNP in the last 8760 hours. HbA1C: No results for  input(s): HGBA1C in the last 72 hours. CBG: No results for input(s): GLUCAP in the last 168 hours. Lipid Profile: No results for input(s): CHOL, HDL, LDLCALC, TRIG, CHOLHDL, LDLDIRECT in the last 72 hours.  Thyroid Function Tests: No results for input(s): TSH, T4TOTAL, FREET4, T3FREE, THYROIDAB in the last 72 hours. Anemia Panel: No results for input(s): VITAMINB12, FOLATE, FERRITIN, TIBC, IRON, RETICCTPCT in the last 72 hours. Sepsis Labs: No results for input(s): PROCALCITON, LATICACIDVEN in the last 168 hours.  Recent Results (from the past 240 hour(s))  Resp Panel by RT-PCR (Flu A&B, Covid) Nasopharyngeal Swab     Status: None   Collection Time: 02/01/21  5:54 PM   Specimen: Nasopharyngeal Swab; Nasopharyngeal(NP) swabs in vial transport medium  Result Value Ref Range Status   SARS Coronavirus 2 by RT PCR NEGATIVE NEGATIVE Final    Comment: (NOTE) SARS-CoV-2 target nucleic acids are NOT DETECTED.  The SARS-CoV-2 RNA is generally detectable in upper respiratory specimens during the acute phase of infection. The lowest concentration of SARS-CoV-2 viral copies this assay can detect is 138 copies/mL. A negative result does not preclude SARS-Cov-2 infection and should not be used as the sole basis for treatment or other patient management decisions. A negative result may occur with  improper specimen collection/handling, submission of specimen other than nasopharyngeal swab, presence of viral mutation(s) within the areas targeted by this assay, and inadequate number of viral copies(<138 copies/mL). A negative result must be combined with clinical observations, patient history, and epidemiological information. The expected result is Negative.  Fact Sheet for Patients:  EntrepreneurPulse.com.au  Fact Sheet for Healthcare Providers:  IncredibleEmployment.be  This test is no t yet approved or cleared by the [place] FDA and  has been  authorized for detection and/or diagnosis of SARS-CoV-2 by FDA under an Emergency Use Authorization (EUA). This EUA will remain  in effect (meaning this test can be used) for the duration of the COVID-19 declaration under Section 564(b)(1) of the Act, 21 U.S.C.section 360bbb-3(b)(1), unless the authorization is terminated  or revoked sooner.       Influenza A by PCR NEGATIVE NEGATIVE Final   Influenza B by PCR NEGATIVE NEGATIVE Final    Comment: (NOTE) The Xpert Xpress SARS-CoV-2/FLU/RSV plus assay is intended as an aid in the diagnosis of influenza from Nasopharyngeal swab specimens and should not be used as a sole basis for treatment. Nasal washings and aspirates are unacceptable for Xpert Xpress SARS-CoV-2/FLU/RSV testing.  Fact Sheet for Patients: EntrepreneurPulse.com.au  Fact Sheet for Healthcare Providers: IncredibleEmployment.be  This test  is not yet approved or cleared by the [place] and has been authorized for detection and/or diagnosis of SARS-CoV-2 by FDA under an Emergency Use Authorization (EUA). This EUA will remain in effect (meaning this test can be used) for the duration of the COVID-19 declaration under Section 564(b)(1) of the Act, 21 U.S.C. section 360bbb-3(b)(1), unless the authorization is terminated or revoked.  Performed at [facility], [address] [place], [place] [address]   Urine Culture     Status: Abnormal   Collection Time: 02/03/21  4:15 PM   Specimen: Urine, Clean Catch  Result Value Ref Range Status   Specimen Description   Final    URINE, CLEAN CATCH Performed at [facility], [address] [place], [place] [address]    Special Requests   Final    Normal Performed at [facility], [address] [place], [place] [address]    Culture MULTIPLE SPECIES PRESENT, SUGGEST RECOLLECTION (A)  Final   Report Status 02/05/2021 FINAL  Final         Radiology  Studies: CT ABDOMEN PELVIS W CONTRAST  Result Date: 02/06/2021 CLINICAL DATA:  Pancreatitis. EXAM: CT ABDOMEN AND PELVIS WITH CONTRAST TECHNIQUE: Multidetector CT imaging of the abdomen and pelvis was performed using the standard protocol following bolus administration of intravenous contrast. CONTRAST:  44mL OMNIPAQUE IOHEXOL 300 MG/ML  SOLN COMPARISON:  February 01, 2021. FINDINGS: Lower chest: Small bilateral pleural effusions are noted with adjacent subsegmental atelectasis. Hepatobiliary: Cholelithiasis is noted. No biliary dilatation is noted. The liver is unremarkable. Pancreas: Significantly increased inflammation is seen involving the pancreatic head and body consistent with worsening pancreatitis. No defined pseudocyst formation is seen at this time. Spleen: Normal in size without focal abnormality. Adrenals/Urinary Tract: Adrenal glands appear normal. Bilateral renal cysts are noted. No hydronephrosis or renal obstruction is noted. No renal or ureteral calculi are noted. Urinary bladder is unremarkable. Stomach/Bowel: The stomach appears normal. Wall and fold thickening of the duodenum is noted which most likely is secondary to adjacent pancreatitis. There is no evidence of bowel obstruction. Sigmoid diverticulosis is noted without inflammation. Vascular/Lymphatic: Aortic atherosclerosis. No enlarged abdominal or pelvic lymph nodes. Reproductive: Moderate prostatic enlargement is noted. Other: Small bilateral fat containing inguinal hernias are noted. Mild amount of free fluid is noted in the pelvis and right pericolic gutter. Musculoskeletal: No acute or significant osseous findings. IMPRESSION: Significantly increased inflammatory stranding is noted around the pancreatic head and body consistent with worsening pancreatitis. No definite pseudocyst formation is seen at this time. Wall and fold thickening of the duodenum is noted which most likely is secondary to adjacent pancreatitis. Small bilateral  pleural effusions are noted with adjacent subsegmental atelectasis. Mild amount of free fluid is noted in the pelvis and right pericolic gutter, most likely related to pancreatitis. Moderate prostatic enlargement. Aortic Atherosclerosis (ICD10-I70.0). Electronically Signed   By: Marijo Conception M.D.   On: 02/06/2021 15:58   DG Chest Port 1 View  Result Date: 02/07/2021 CLINICAL DATA:  Hypoxia.  Gallstone pancreatitis. EXAM: PORTABLE CHEST 1 VIEW COMPARISON:  02/01/2021 FINDINGS: The heart is enlarged. There is streaky atelectasis at both lung bases. No focal consolidations or pleural effusions. No pulmonary edema. IMPRESSION: Stable cardiomegaly. Electronically Signed   By: Nolon Nations M.D.   On: 02/07/2021 12:17        Scheduled Meds:  atorvastatin  20 mg Oral q1800   carvedilol  3.125 mg Oral BID WC   pantoprazole  40  mg Oral BID   tamsulosin  0.4 mg Oral QPC breakfast   Continuous Infusions:  chlorproMAZINE (THORAZINE) IV Stopped (02/06/21 0943)   lactated ringers 125 mL/hr at 02/07/21 2350     LOS: 7 days    Time spent: 25 minutes    Sidney Ace, MD [facility] Pager [phone]  If 7PM-7AM, please contact night-coverage 02/08/2021, 10:24 AM

## 2021-02-08 NOTE — TOC Progression Note (Addendum)
Transition of Care [facility]) - Progression Note    Patient Details  Name: James Mora MRN: [id number] Date of Birth: Feb 11, 1936  Transition of [facility]) CM/SW [place], LCSW Phone Number: 02/08/2021, 10:14 AM  Clinical Narrative:   Per TOC notes, family wanted to decide DC plan after PT saw patient again. Asked PT if patient is on list for today. Per PT, patient not on list for weekend. TOC to follow up during week.    Expected Discharge Plan: Home/Self Care Barriers to Discharge: Continued Medical Work up  Expected Discharge Plan and Services Expected Discharge Plan: Home/Self Care     Post Acute Care Choice: [place] arrangements for the past 2 months: Single Family Home                 DME Arranged: N/A DME Agency: NA       HH Arranged: RN, PT, OT [place] Agency: [place] ([facility]) Date HH Agency Contacted: 02/06/21 Time [place]: [place] Representative spoke with at [place]: [place] ([place]) Interventions    Readmission Risk Interventions No flowsheet data found.

## 2021-02-09 DIAGNOSIS — K859 Acute pancreatitis without necrosis or infection, unspecified: Secondary | ICD-10-CM

## 2021-02-09 LAB — CBC WITH DIFFERENTIAL/PLATELET
Abs Immature Granulocytes: 0.27 10*3/uL — ABNORMAL HIGH (ref 0.00–0.07)
Basophils Absolute: 0.1 10*3/uL (ref 0.0–0.1)
Basophils Relative: 0 %
Eosinophils Absolute: 0 10*3/uL (ref 0.0–0.5)
Eosinophils Relative: 0 %
HCT: 31.8 % — ABNORMAL LOW (ref 39.0–52.0)
Hemoglobin: 10.7 g/dL — ABNORMAL LOW (ref 13.0–17.0)
Immature Granulocytes: 1 %
Lymphocytes Relative: 3 %
Lymphs Abs: 0.6 10*3/uL — ABNORMAL LOW (ref 0.7–4.0)
MCH: 31.2 pg (ref 26.0–34.0)
MCHC: 33.6 g/dL (ref 30.0–36.0)
MCV: 92.7 fL (ref 80.0–100.0)
Monocytes Absolute: 0.9 10*3/uL (ref 0.1–1.0)
Monocytes Relative: 5 %
Neutro Abs: 16.9 10*3/uL — ABNORMAL HIGH (ref 1.7–7.7)
Neutrophils Relative %: 91 %
Platelets: 234 10*3/uL (ref 150–400)
RBC: 3.43 MIL/uL — ABNORMAL LOW (ref 4.22–5.81)
RDW: 14.4 % (ref 11.5–15.5)
WBC: 18.7 10*3/uL — ABNORMAL HIGH (ref 4.0–10.5)
nRBC: 0 % (ref 0.0–0.2)

## 2021-02-09 LAB — COMPREHENSIVE METABOLIC PANEL
ALT: 17 U/L (ref 0–44)
ALT: 17 U/L (ref 0–44)
AST: 36 U/L (ref 15–41)
AST: 41 U/L (ref 15–41)
Albumin: 2 g/dL — ABNORMAL LOW (ref 3.5–5.0)
Albumin: 1.9 g/dL — ABNORMAL LOW (ref 3.5–5.0)
Alkaline Phosphatase: 72 U/L (ref 38–126)
Alkaline Phosphatase: 67 U/L (ref 38–126)
Anion gap: 9 (ref 5–15)
Anion gap: 5 (ref 5–15)
BUN: 29 mg/dL — ABNORMAL HIGH (ref 8–23)
BUN: 26 mg/dL — ABNORMAL HIGH (ref 8–23)
CO2: 29 mmol/L (ref 22–32)
CO2: 32 mmol/L (ref 22–32)
Calcium: 7.6 mg/dL — ABNORMAL LOW (ref 8.9–10.3)
Calcium: 7.3 mg/dL — ABNORMAL LOW (ref 8.9–10.3)
Chloride: 103 mmol/L (ref 98–111)
Chloride: 101 mmol/L (ref 98–111)
Creatinine, Ser: 1.51 mg/dL — ABNORMAL HIGH (ref 0.61–1.24)
Creatinine, Ser: 1.41 mg/dL — ABNORMAL HIGH (ref 0.61–1.24)
GFR, Estimated: 46 mL/min — ABNORMAL LOW (ref >60)
GFR, Estimated: 50 mL/min — ABNORMAL LOW (ref >60)
Glucose, Bld: 92 mg/dL (ref 70–99)
Glucose, Bld: 117 mg/dL — ABNORMAL HIGH (ref 70–99)
Potassium: 3.5 mmol/L (ref 3.5–5.1)
Potassium: 3.4 mmol/L — ABNORMAL LOW (ref 3.5–5.1)
Sodium: 141 mmol/L (ref 135–145)
Sodium: 138 mmol/L (ref 135–145)
Total Bilirubin: 4.6 mg/dL — ABNORMAL HIGH (ref 0.3–1.2)
Total Bilirubin: 4.5 mg/dL — ABNORMAL HIGH (ref 0.3–1.2)
Total Protein: 5.2 g/dL — ABNORMAL LOW (ref 6.5–8.1)
Total Protein: 5.1 g/dL — ABNORMAL LOW (ref 6.5–8.1)

## 2021-02-09 LAB — BILIRUBIN, FRACTIONATED(TOT/DIR/INDIR)
Bilirubin, Direct: 2.4 mg/dL — ABNORMAL HIGH (ref 0.0–0.2)
Indirect Bilirubin: 2 mg/dL — ABNORMAL HIGH (ref 0.3–0.9)
Total Bilirubin: 4.4 mg/dL — ABNORMAL HIGH (ref 0.3–1.2)

## 2021-02-09 MED ORDER — POTASSIUM CHLORIDE 10 MEQ/100ML IV SOLN
10.0000 meq | INTRAVENOUS | Status: AC
  Administered 2021-02-09 (×4): 10 meq via INTRAVENOUS
  Filled 2021-02-09 (×4): qty 100

## 2021-02-09 MED ORDER — SODIUM CHLORIDE 0.9 % IV BOLUS
1000.0000 mL | Freq: Once | INTRAVENOUS | Status: AC
  Administered 2021-02-09: 11:00:00 1000 mL via INTRAVENOUS

## 2021-02-09 MED ORDER — POLYETHYLENE GLYCOL 3350 17 G PO PACK
17.0000 g | PACK | Freq: Every day | ORAL | Status: DC
  Administered 2021-02-09 – 2021-02-10 (×2): 17 g via ORAL
  Filled 2021-02-09 (×2): qty 1

## 2021-02-09 NOTE — Care Management Important Message (Signed)
Important Message  Patient Details  Name: James Mora MRN: [id number] Date of Birth: Oct 04, 1935   Medicare Important Message Given:  Yes     Dannette Barbara 02/09/2021, 10:57 AM

## 2021-02-09 NOTE — Progress Notes (Addendum)
James Mora , MD [address], [place], [place], [place], [address] [address], [place], [place], [place], [address] Phone: [phone]  Fax: [phone]   Milon Dethloff Cottier is being followed for acute biliary pancreatitis    Subjective: Feels well , infact he says he feels thebest today. Denies any fever or chills. Had severe abdominal pain yesterday and all of a sudden within a moment it resolved. Tolerating PO today    Objective: Vital signs in last 24 hours: Vitals:   02/08/21 2315 02/09/21 0434 02/09/21 0712 02/09/21 1216  BP: (!) 143/80 138/71 (!) 145/74 (!) 149/78  Pulse: 88 90 89 84  Resp: 20 16 18 20   Temp: 99 F (37.2 C) 99.1 F (37.3 C) 98.7 F (37.1 C) 98.9 F (37.2 C)  TempSrc:      SpO2: 98% 97% 98% 100%  Weight:      Height:       Weight change:   Intake/Output Summary (Last 24 hours) at 02/09/2021 1413 Last data filed at 02/09/2021 1024 Gross per 24 hour  Intake 236 ml  Output 900 ml  Net -664 ml     Exam:  Abdomen: soft, nontender, normal bowel sounds   Lab Results: @LABTEST2 @ Micro Results: Recent Results (from the past 240 hour(s))  Resp Panel by RT-PCR (Flu A&B, Covid) Nasopharyngeal Swab     Status: None   Collection Time: 02/01/21  5:54 PM   Specimen: Nasopharyngeal Swab; Nasopharyngeal(NP) swabs in vial transport medium  Result Value Ref Range Status   SARS Coronavirus 2 by RT PCR NEGATIVE NEGATIVE Final    Comment: (NOTE) SARS-CoV-2 target nucleic acids are NOT DETECTED.  The SARS-CoV-2 RNA is generally detectable in upper respiratory specimens during the acute phase of infection. The lowest concentration of SARS-CoV-2 viral copies this assay can detect is 138 copies/mL. A negative result does not preclude SARS-Cov-2 infection and should not be used as the sole basis for treatment or other patient management decisions. A negative result may occur with  improper specimen collection/handling, submission of specimen other than  nasopharyngeal swab, presence of viral mutation(s) within the areas targeted by this assay, and inadequate number of viral copies(<138 copies/mL). A negative result must be combined with clinical observations, patient history, and epidemiological information. The expected result is Negative.  Fact Sheet for Patients:  EntrepreneurPulse.com.au  Fact Sheet for Healthcare Providers:  IncredibleEmployment.be  This test is no t yet approved or cleared by the [place] FDA and  has been authorized for detection and/or diagnosis of SARS-CoV-2 by FDA under an Emergency Use Authorization (EUA). This EUA will remain  in effect (meaning this test can be used) for the duration of the COVID-19 declaration under Section 564(b)(1) of the Act, 21 U.S.C.section 360bbb-3(b)(1), unless the authorization is terminated  or revoked sooner.       Influenza A by PCR NEGATIVE NEGATIVE Final   Influenza B by PCR NEGATIVE NEGATIVE Final    Comment: (NOTE) The Xpert Xpress SARS-CoV-2/FLU/RSV plus assay is intended as an aid in the diagnosis of influenza from Nasopharyngeal swab specimens and should not be used as a sole basis for treatment. Nasal washings and aspirates are unacceptable for Xpert Xpress SARS-CoV-2/FLU/RSV testing.  Fact Sheet for Patients: EntrepreneurPulse.com.au  Fact Sheet for Healthcare Providers: IncredibleEmployment.be  This test is not yet approved or cleared by the [place] FDA and has been authorized for detection and/or diagnosis of SARS-CoV-2 by FDA under an Emergency Use Authorization (EUA). This EUA will  remain in effect (meaning this test can be used) for the duration of the COVID-19 declaration under Section 564(b)(1) of the Act, 21 U.S.C. section 360bbb-3(b)(1), unless the authorization is terminated or revoked.  Performed at [facility], [address], [place], [place]  [address]   Urine Culture     Status: Abnormal   Collection Time: 02/03/21  4:15 PM   Specimen: Urine, Clean Catch  Result Value Ref Range Status   Specimen Description   Final    URINE, CLEAN CATCH Performed at [facility], [address], [place], [place] [address]    Special Requests   Final    Normal Performed at [facility], [address] [place], [place] [address]    Culture MULTIPLE SPECIES PRESENT, SUGGEST RECOLLECTION (A)  Final   Report Status 02/05/2021 FINAL  Final   Studies/Results: MR 3D Recon At Scanner  Result Date: 02/08/2021 CLINICAL DATA:  Jaundice. Abnormal LFTs. Pancreatitis on previous recent CT EXAM: MRI ABDOMEN WITHOUT AND WITH CONTRAST (INCLUDING MRCP) TECHNIQUE: Multiplanar multisequence MR imaging of the abdomen was performed both before and after the administration of intravenous contrast. Heavily T2-weighted images of the biliary and pancreatic ducts were obtained, and three-dimensional MRCP images were rendered by post processing. CONTRAST:  7.4mL GADAVIST GADOBUTROL 1 MMOL/ML IV SOLN COMPARISON:  CT scan 02/06/2021 FINDINGS: Lower chest: Small bilateral effusions with dependent atelectasis in the lower lobes. Heart size upper normal to mildly increased. Hepatobiliary: 2.0 cm T2 hyperintense lesion in the central right liver is hypointense on T1 imaging and flash fills after IV contrast administration. Tiny nonenhancing T2 focus in the anterior left liver is too small to characterize but likely a cyst. Tiny gallstone measures 3-4 mm (axial T2 haste image 16/series 3. Mild fullness of the intrahepatic bile ducts evident common duct measures 6 mm diameter upper normal. There is gradual tapering of the common bile duct through the head of the pancreas into the ampulla. No evidence for choledocholithiasis. Pancreas: Motion degraded assessment shows an area of decreased perfusion in the head of pancreas with diffuse ill definition of pancreatic  parenchyma. Peripancreatic fluid is similar to recent CT. Spleen:  No splenomegaly. No focal mass lesion. Adrenals/Urinary Tract: No adrenal nodule or mass. Multiple bilateral renal cysts of varying size and signal intensity noted. Stomach/Bowel: Stomach is unremarkable. No gastric wall thickening. No evidence of outlet obstruction. Duodenum is normally positioned as is the ligament of Treitz. No small bowel or colonic dilatation within the visualized abdomen. Vascular/Lymphatic: No abdominal aortic aneurysm. Portal vein is patent. There is marked mass-effect on the portal splenic confluence and while attenuated, this does appear to remain patent. Splenic vein is attenuated in the region of the pancreatic body but remains patent. Celiac axis is patent although the trifurcation is not well demonstrated. There is no gastrohepatic or hepatoduodenal ligament lymphadenopathy. No retroperitoneal or mesenteric lymphadenopathy. Other: Free fluid is seen around the liver and spleen with retroperitoneal fluid and edema tracking in the transverse mesocolon and root of the small bowel mesentery. Musculoskeletal: No focal suspicious marrow enhancement within the visualized bony anatomy. IMPRESSION: 1. Motion degraded assessment shows ill definition of the pancreatic parenchyma in the head of pancreas with peripancreatic fluid and edema tracking in the transverse mesocolon and root of the small bowel mesentery. Imaging features are consistent with acute pancreatitis. No rim enhancing fluid collection at this time. Small focus of potential decreased enhancement noted in the anterior pancreatic head raising the question of a small  focus of pancreatic necrosis. 2. Mild fullness of the intrahepatic bile ducts with gradual tapering of the common bile duct through the head of the pancreas into the ampulla. No evidence for choledocholithiasis. No obstructing mass lesion evident. 3. New fluid seen between the lateral segment left liver  and stomach with similar appearance of confluent fluid anterior to the head of the pancreas. 4. Cholelithiasis. 5. 2.0 cm T2 hyperintense lesion in the central right liver flash fills after IV contrast administration. This is likely benign and may be a hemangioma. Follow-up MRI in 3-6 months could be used to ensure stability. 6. Small bilateral effusions with dependent atelectasis in the lower lobes. 7. Multiple bilateral renal cysts of varying size and signal intensity. Electronically Signed   By: Misty Stanley M.D.   On: 02/08/2021 15:06   MR ABDOMEN MRCP W WO CONTAST  Result Date: 02/08/2021 CLINICAL DATA:  Jaundice. Abnormal LFTs. Pancreatitis on previous recent CT EXAM: MRI ABDOMEN WITHOUT AND WITH CONTRAST (INCLUDING MRCP) TECHNIQUE: Multiplanar multisequence MR imaging of the abdomen was performed both before and after the administration of intravenous contrast. Heavily T2-weighted images of the biliary and pancreatic ducts were obtained, and three-dimensional MRCP images were rendered by post processing. CONTRAST:  7.106mL GADAVIST GADOBUTROL 1 MMOL/ML IV SOLN COMPARISON:  CT scan 02/06/2021 FINDINGS: Lower chest: Small bilateral effusions with dependent atelectasis in the lower lobes. Heart size upper normal to mildly increased. Hepatobiliary: 2.0 cm T2 hyperintense lesion in the central right liver is hypointense on T1 imaging and flash fills after IV contrast administration. Tiny nonenhancing T2 focus in the anterior left liver is too small to characterize but likely a cyst. Tiny gallstone measures 3-4 mm (axial T2 haste image 16/series 3. Mild fullness of the intrahepatic bile ducts evident common duct measures 6 mm diameter upper normal. There is gradual tapering of the common bile duct through the head of the pancreas into the ampulla. No evidence for choledocholithiasis. Pancreas: Motion degraded assessment shows an area of decreased perfusion in the head of pancreas with diffuse ill definition of  pancreatic parenchyma. Peripancreatic fluid is similar to recent CT. Spleen:  No splenomegaly. No focal mass lesion. Adrenals/Urinary Tract: No adrenal nodule or mass. Multiple bilateral renal cysts of varying size and signal intensity noted. Stomach/Bowel: Stomach is unremarkable. No gastric wall thickening. No evidence of outlet obstruction. Duodenum is normally positioned as is the ligament of Treitz. No small bowel or colonic dilatation within the visualized abdomen. Vascular/Lymphatic: No abdominal aortic aneurysm. Portal vein is patent. There is marked mass-effect on the portal splenic confluence and while attenuated, this does appear to remain patent. Splenic vein is attenuated in the region of the pancreatic body but remains patent. Celiac axis is patent although the trifurcation is not well demonstrated. There is no gastrohepatic or hepatoduodenal ligament lymphadenopathy. No retroperitoneal or mesenteric lymphadenopathy. Other: Free fluid is seen around the liver and spleen with retroperitoneal fluid and edema tracking in the transverse mesocolon and root of the small bowel mesentery. Musculoskeletal: No focal suspicious marrow enhancement within the visualized bony anatomy. IMPRESSION: 1. Motion degraded assessment shows ill definition of the pancreatic parenchyma in the head of pancreas with peripancreatic fluid and edema tracking in the transverse mesocolon and root of the small bowel mesentery. Imaging features are consistent with acute pancreatitis. No rim enhancing fluid collection at this time. Small focus of potential decreased enhancement noted in the anterior pancreatic head raising the question of a small focus of pancreatic necrosis. 2. Mild fullness  of the intrahepatic bile ducts with gradual tapering of the common bile duct through the head of the pancreas into the ampulla. No evidence for choledocholithiasis. No obstructing mass lesion evident. 3. New fluid seen between the lateral segment  left liver and stomach with similar appearance of confluent fluid anterior to the head of the pancreas. 4. Cholelithiasis. 5. 2.0 cm T2 hyperintense lesion in the central right liver flash fills after IV contrast administration. This is likely benign and may be a hemangioma. Follow-up MRI in 3-6 months could be used to ensure stability. 6. Small bilateral effusions with dependent atelectasis in the lower lobes. 7. Multiple bilateral renal cysts of varying size and signal intensity. Electronically Signed   By: Misty Stanley M.D.   On: 02/08/2021 15:06   Medications: I have reviewed the patient's current medications. Scheduled Meds:  atorvastatin  20 mg Oral q1800   carvedilol  3.125 mg Oral BID WC   pantoprazole  40 mg Oral BID   tamsulosin  0.4 mg Oral QPC breakfast   Continuous Infusions:  chlorproMAZINE (THORAZINE) IV Stopped (02/06/21 0943)   lactated ringers 150 mL/hr at 02/09/21 0929   PRN Meds:.acetaminophen **OR** acetaminophen, chlorproMAZINE (THORAZINE) IV, HYDROmorphone (DILAUDID) injection, magnesium hydroxide, nitroGLYCERIN, ondansetron **OR** ondansetron (ZOFRAN) IV, oxyCODONE, traZODone   Assessment: Active Problems:   Benign prostatic hyperplasia without lower urinary tract symptoms   Acute pancreatitis   Gallstones   Enteritis   Gallstone pancreatitis   Shortness of breath  Henrik Orihuela Doyel 85 y.o. male was admitted on 02/01/2021 for acute pancreatitis, felt most likely related to gall stones but Bactrim as a cause was also entertained. RUQ ultrasound showed no biliary dilation . Been followed by surgery since. I was asked to see the patient today for elevation of T bilirubin from 2.4---> 4.6 , MRCP done yesterday did not show any biliary dilation or any cholidocholithiasis. Creatinine has risen since yesterday from 1.39---> 1.51 , Lerna also risen at 18.7 . Possible small area of pancreatic necrosis noted, ascites. Yesterday he clearly states had severe abdominal pain and all of a  sudden it resolved in an instant suggesting he passed a stone. His oral intake is much better today and he is pain free presently.   With no clear evidence of biliary compression/obstruction on MRCP which as a sensitivity of around 87-92%, makes obstruction less likely but not impossible.   If T bilirubin particularly D bilirubin continues to rise despite a good intake of IV fluids to treat the elevated creatinine by tomorrow then next step would recommend EUS to rule out any    small stones < 6 mm in size causing biliary obstruction or Cholecystectomy with intra hepatic cholangiogram to rule out obstruction . He is tolerating orally , suggest to continue  as much as possible if tolerated as it has been shown to improve outcomes due to gut mediated immunity , if cannot tolerate enteral  consider [place] feeding vs TPN. Dr Dava Najjar team will be following the patient back from tomorrow. Suggest daily mirlax as he has not had a bowel movement since admission.      LOS: 8 days   James Bellows, MD 02/09/2021, 2:13 PM

## 2021-02-09 NOTE — Progress Notes (Addendum)
PROGRESS NOTE    James Mora  IRC:[id number] DOB: 1936/01/02 DOA: 02/01/2021 PCP: [name], PA-C   Brief Narrative:  Patient admitted 02/01/2021 with nausea vomiting abdominal pain and diarrhea.  He was diagnosed with acute pancreatitis with gallstones and enteritis seen on CT scan.  Patient was started on PPI therapy.  General surgery and gastroenterology consulted.  Lipase has trended better and general surgery will talk to the patient about removing the gallbladder.  Patient was initially scheduled for cholecystectomy on 6/29.  However in the morning he was complaining of acutely worsened abdominal pain.  Unclear etiology.  Lipase and troponin both reassuring.  Discussed with general surgery.  Cholecystectomy on hold for now pending improvement in pain control.  Work-up included repeat lipase which is trending down as well as troponin which is not indicative of acute coronary syndrome.  On 7/1 patient again rescheduled for cholecystectomy however he has increasing abdominal pain with increasing work of breathing.  Abdomen is mildly distended.  Discussed with general surgery and cholecystectomy on hold and likely will be deferred to outpatient in 4 to 6 weeks.  We will pursue CT abdomen pelvis to evaluate for possible pancreatic fluid collection versus on pancreatitis related ileus  7/2: Abdominal pain persistent.  CT abdomen pelvis with worsening peripancreatic edema consistent with persistent pancreatitis.  Surgical plan on hold.  Will not pursue surgical intervention during this admission.  Patient also endorsing some shortness of breath and exam notable for end expiratory wheeze.  Bibasilar crackles noted on exam.  7/3: Mildly increased bilirubin.  Patient remained stable however uptrending leukocytosis and persistent abdominal pain.  General surgery ordered MRCP.  7/4: Bilirubin continues to increase.  MRCP negative for CBD stone however did show some distal tapering of the  common bile duct.  Could be due to extrinsic compression from edema versus sphincter of Oddi dysfunction ampullary stone not seen on imaging.  Patient with persistent abdominal pain and distention.  Case discussed at length with general surgery and GI.  If bilirubin continues to trend up may need transfer to higher level of care for definitive management and advanced endoscopic services not available at this facility   Assessment & Plan:   Active Problems:   Benign prostatic hyperplasia without lower urinary tract symptoms   Acute pancreatitis   Gallstones   Enteritis   Gallstone pancreatitis   Shortness of breath  Acute gallstone pancreatitis Hyperbilirubinemia Persistent imaging evidence of pancreatitis despite normalization of lipase Worsening leukocytosis Uptrending bilirubin Increasing abdominal pain associated with distention CT abdomen pelvis, worsening peripancreatic edema MRCP with persistent peripancreatic edema.  Negative for CBD stone.  Distal tapering of CBD with uptrending bilirubin Plan: While there is no imaging evidence of choledocholithiasis the uptrending bilirubin and persistent abdominal pain and distention is concerning.  Case discussed at length with general surgery and GI.  Per GI decreased renal clearance could cause bilirubin to increase and relative hypokalemia could also be contributing.  Will increased rate of fluids to 150 cc/h and bolus 1L normal saline.  Replace potassium.  Recheck LFTs and kidney function at 1400 today.  We will reach out to GI with results and further discussion regarding management.  Patient may require transfer to higher level of care for consideration for ERCP versus EUS versus PTC.  Understand that none of these interventions are ideal in the setting of active pancreatitis however at this point patient is high risk for further clinical deterioration.  Will attempt to cautiously advance to clear  liquid diet today.  If patient does not  tolerate he may require PICC line placement and initiation of TPN.  Acute hypoxic respiratory failure Patient endorsing seeing dyspnea on exertion.  New oxygen requirement 2L.  Chest x-ray ordered on 7/2 demonstrates bibasilar atelectasis without edema or effusion.  I suspect this is secondary to splinting from pain. Plan: Continue to encourage incentive spirometry use.  Encourage ambulation and exertion is much as tolerated.  Hematuria Noted on 7/1.  Resolved spontaneously without intervention.  No stones noted on CAT scan though this was done with imaging.  May be a passed stone. Hemoglobin has been stable Plan: Monitor UOP and daily CBC If patient starts to pass clots or hematuria becomes persistent we will involve urology and consider CBI  Acute kidney injury on chronic kidney disease stage IIIa Baseline creatinine 1.64 Creatinine peaked at 2.23, subsequently downtrending Worsening kidney function on 7/4.  Suspect possible contrast-induced nephropathy Plan: Increase rate of IV fluids as above Nonessential nephrotoxins to be avoided  Acute enteritis on CAT scan Suspect secondary inflammation from acute pancreatitis Continue IV PPI and pain control  History of coronary disease Hyperlipidemia Echocardiogram reassuring No further inpatient cardiac diagnostics Plan: Continue Coreg and atorvastatin Continue telemetry monitoring  Essential hypertension Well-controlled over interval Continue Coreg and enalapril  GERD PPI  BPH Flomax   DVT prophylaxis: SCDs Code Status: Full Family Communication: Wife at bedside 6/29, 6/30, 7/1, 7/2, 7/3, 7/4 Disposition Plan: Status is: Inpatient  Remains inpatient appropriate because:Inpatient level of care appropriate due to severity of illness  Dispo: The patient is from: Home              Anticipated d/c is to: TBD              Patient currently is not medically stable to d/c.   Difficult to place patient No  Acute gallstone  pancreatitis with associated uptrending bilirubin.  Unclear etiology.  Gallbladder sludge versus extrinsic compression of the common bile duct secondary to edema.    Level of care: Med-Surg  Consultants:  General surgery Cardiology  Procedures:  None  Antimicrobials:  None   Subjective: Patient seen and examined.  Appears fatigued.  Continues to endorse severe abdominal pain  Objective: Vitals:   02/08/21 1931 02/08/21 2315 02/09/21 0434 02/09/21 0712  BP: (!) 142/73 (!) 143/80 138/71 (!) 145/74  Pulse: 90 88 90 89  Resp: _0 Temp: 99.2 F (37.3 C) 99 F (37.2 C) 99.1 F (37.3 C) 98.7 F (37.1 C)  TempSrc:      SpO2: 99% 98% 97% 98%  Weight:      Height:        Intake/Output Summary (Last 24 hours) at 02/09/2021 1041 Last data filed at 02/09/2021 1024 Gross per 24 hour  Intake 236 ml  Output 900 ml  Net -664 ml   Filed Weights   02/01/21 1644 02/01/21 2319  Weight: 74.8 kg 73.5 kg    Examination:  General exam: Mild distress due to pain.  Appears fatigued Respiratory system: Bibasilar crackles.  Normal work of breathing.  2 L Cardiovascular system: S1-S2, tachycardic, no murmurs, no pedal edema  gastrointestinal system: Soft, mildly distended, epigastric tenderness Central nervous system: Alert and oriented. No focal neurological deficits. Extremities: Symmetric 5 x 5 power. Skin: No rashes, lesions or ulcers Psychiatry: Judgement and insight appear normal. Mood & affect appropriate.     Data Reviewed: I have personally reviewed following labs and imaging studies  CBC: Recent Labs  Lab 02/05/21 0405 02/06/21 0455 02/07/21 0607 02/08/21 0420 02/09/21 0623  WBC 11.9* 12.0* 13.6* 16.7* 18.7*  NEUTROABS 10.7* 10.4* 12.0* 15.0* 16.9*  HGB 11.2* 11.0* 11.1* 10.8* 10.7*  HCT 32.3* 32.8* 32.6* 32.2* 31.8*  MCV 90.5 92.4 91.6 90.7 92.7  PLT 127* 134* 166 184 891   Basic Metabolic Panel: Recent Labs  Lab 02/05/21 0405 02/06/21 0455  02/07/21 0607 02/08/21 0420 02/09/21 0623  NA 138 137 138 141 141  K 3.5 3.1* 3.1* 3.4* 3.5  CL 103 102 101 106 103  CO2 _0 GLUCOSE 96 103* 92 90 92  BUN 45* 38* 34* 33* 29*  CREATININE 1.82* 1.55* 1.46* 1.39* 1.51*  CALCIUM 7.7* 7.7* 7.8* 7.7* 7.6*   GFR: Estimated Creatinine Clearance: 38.9 mL/min (A) (by C-G formula based on SCr of 1.51 mg/dL (H)). Liver Function Tests: Recent Labs  Lab 02/05/21 0405 02/06/21 0455 02/07/21 0607 02/08/21 0420 02/09/21 0623  AST _1 36  ALT _2 ALKPHOS 52 54 59 60 72  BILITOT 1.7* 1.6* 2.1* 2.4*  2.4* 4.6*  PROT 5.8* 5.5* 5.5* 5.0* 5.2*  ALBUMIN 2.5* 2.3* 2.2* 2.0* 2.0*   Recent Labs  Lab 02/03/21 0420 02/04/21 0445 02/08/21 0420  LIPASE 184* 60* 19   No results for input(s): AMMONIA in the last 168 hours. Coagulation Profile: No results for input(s): INR, PROTIME in the last 168 hours. Cardiac Enzymes: No results for input(s): CKTOTAL, CKMB, CKMBINDEX, TROPONINI in the last 168 hours. BNP (last 3 results) No results for input(s): PROBNP in the last 8760 hours. HbA1C: No results for input(s): HGBA1C in the last 72 hours. CBG: No results for input(s): GLUCAP in the last 168 hours. Lipid Profile: No results for input(s): CHOL, HDL, LDLCALC, TRIG, CHOLHDL, LDLDIRECT in the last 72 hours.  Thyroid Function Tests: No results for input(s): TSH, T4TOTAL, FREET4, T3FREE, THYROIDAB in the last 72 hours. Anemia Panel: No results for input(s): VITAMINB12, FOLATE, FERRITIN, TIBC, IRON, RETICCTPCT in the last 72 hours. Sepsis Labs: No results for input(s): PROCALCITON, LATICACIDVEN in the last 168 hours.  Recent Results (from the past 240 hour(s))  Resp Panel by RT-PCR (Flu A&B, Covid) Nasopharyngeal Swab     Status: None   Collection Time: 02/01/21  5:54 PM   Specimen: Nasopharyngeal Swab; Nasopharyngeal(NP) swabs in vial transport medium  Result Value Ref Range Status   SARS Coronavirus 2 by RT  PCR NEGATIVE NEGATIVE Final    Comment: (NOTE) SARS-CoV-2 target nucleic acids are NOT DETECTED.  The SARS-CoV-2 RNA is generally detectable in upper respiratory specimens during the acute phase of infection. The lowest concentration of SARS-CoV-2 viral copies this assay can detect is 138 copies/mL. A negative result does not preclude SARS-Cov-2 infection and should not be used as the sole basis for treatment or other patient management decisions. A negative result may occur with  improper specimen collection/handling, submission of specimen other than nasopharyngeal swab, presence of viral mutation(s) within the areas targeted by this assay, and inadequate number of viral copies(<138 copies/mL). A negative result must be combined with clinical observations, patient history, and epidemiological information. The expected result is Negative.  Fact Sheet for Patients:  EntrepreneurPulse.com.au  Fact Sheet for Healthcare Providers:  IncredibleEmployment.be  This test is no t yet approved or cleared by the [place] FDA and  has been authorized for detection and/or diagnosis of SARS-CoV-2 by FDA under an Emergency Use  Authorization (EUA). This EUA will remain  in effect (meaning this test can be used) for the duration of the COVID-19 declaration under Section 564(b)(1) of the Act, 21 U.S.C.section 360bbb-3(b)(1), unless the authorization is terminated  or revoked sooner.       Influenza A by PCR NEGATIVE NEGATIVE Final   Influenza B by PCR NEGATIVE NEGATIVE Final    Comment: (NOTE) The Xpert Xpress SARS-CoV-2/FLU/RSV plus assay is intended as an aid in the diagnosis of influenza from Nasopharyngeal swab specimens and should not be used as a sole basis for treatment. Nasal washings and aspirates are unacceptable for Xpert Xpress SARS-CoV-2/FLU/RSV testing.  Fact Sheet for Patients: EntrepreneurPulse.com.au  Fact Sheet for  Healthcare Providers: IncredibleEmployment.be  This test is not yet approved or cleared by the [place] FDA and has been authorized for detection and/or diagnosis of SARS-CoV-2 by FDA under an Emergency Use Authorization (EUA). This EUA will remain in effect (meaning this test can be used) for the duration of the COVID-19 declaration under Section 564(b)(1) of the Act, 21 U.S.C. section 360bbb-3(b)(1), unless the authorization is terminated or revoked.  Performed at [facility], [address] [place], [place] [address]   Urine Culture     Status: Abnormal   Collection Time: 02/03/21  4:15 PM   Specimen: Urine, Clean Catch  Result Value Ref Range Status   Specimen Description   Final    URINE, CLEAN CATCH Performed at [facility], [address] [place], [place] [address]    Special Requests   Final    Normal Performed at [facility], [address] [place], [place] [address]    Culture MULTIPLE SPECIES PRESENT, SUGGEST RECOLLECTION (A)  Final   Report Status 02/05/2021 FINAL  Final         Radiology Studies: MR 3D Recon At Scanner  Result Date: 02/08/2021 CLINICAL DATA:  Jaundice. Abnormal LFTs. Pancreatitis on previous recent CT EXAM: MRI ABDOMEN WITHOUT AND WITH CONTRAST (INCLUDING MRCP) TECHNIQUE: Multiplanar multisequence MR imaging of the abdomen was performed both before and after the administration of intravenous contrast. Heavily T2-weighted images of the biliary and pancreatic ducts were obtained, and three-dimensional MRCP images were rendered by post processing. CONTRAST:  7.37m GADAVIST GADOBUTROL 1 MMOL/ML IV SOLN COMPARISON:  CT scan 02/06/2021 FINDINGS: Lower chest: Small bilateral effusions with dependent atelectasis in the lower lobes. Heart size upper normal to mildly increased. Hepatobiliary: 2.0 cm T2 hyperintense lesion in the central right liver is hypointense on T1 imaging and flash fills after IV  contrast administration. Tiny nonenhancing T2 focus in the anterior left liver is too small to characterize but likely a cyst. Tiny gallstone measures 3-4 mm (axial T2 haste image 16/series 3. Mild fullness of the intrahepatic bile ducts evident common duct measures 6 mm diameter upper normal. There is gradual tapering of the common bile duct through the head of the pancreas into the ampulla. No evidence for choledocholithiasis. Pancreas: Motion degraded assessment shows an area of decreased perfusion in the head of pancreas with diffuse ill definition of pancreatic parenchyma. Peripancreatic fluid is similar to recent CT. Spleen:  No splenomegaly. No focal mass lesion. Adrenals/Urinary Tract: No adrenal nodule or mass. Multiple bilateral renal cysts of varying size and signal intensity noted. Stomach/Bowel: Stomach is unremarkable. No gastric wall thickening. No evidence of outlet obstruction. Duodenum is normally positioned as is the ligament of Treitz. No small bowel or colonic dilatation within the visualized abdomen. Vascular/Lymphatic: No abdominal aortic aneurysm. Portal  vein is patent. There is marked mass-effect on the portal splenic confluence and while attenuated, this does appear to remain patent. Splenic vein is attenuated in the region of the pancreatic body but remains patent. Celiac axis is patent although the trifurcation is not well demonstrated. There is no gastrohepatic or hepatoduodenal ligament lymphadenopathy. No retroperitoneal or mesenteric lymphadenopathy. Other: Free fluid is seen around the liver and spleen with retroperitoneal fluid and edema tracking in the transverse mesocolon and root of the small bowel mesentery. Musculoskeletal: No focal suspicious marrow enhancement within the visualized bony anatomy. IMPRESSION: 1. Motion degraded assessment shows ill definition of the pancreatic parenchyma in the head of pancreas with peripancreatic fluid and edema tracking in the transverse  mesocolon and root of the small bowel mesentery. Imaging features are consistent with acute pancreatitis. No rim enhancing fluid collection at this time. Small focus of potential decreased enhancement noted in the anterior pancreatic head raising the question of a small focus of pancreatic necrosis. 2. Mild fullness of the intrahepatic bile ducts with gradual tapering of the common bile duct through the head of the pancreas into the ampulla. No evidence for choledocholithiasis. No obstructing mass lesion evident. 3. New fluid seen between the lateral segment left liver and stomach with similar appearance of confluent fluid anterior to the head of the pancreas. 4. Cholelithiasis. 5. 2.0 cm T2 hyperintense lesion in the central right liver flash fills after IV contrast administration. This is likely benign and may be a hemangioma. Follow-up MRI in 3-6 months could be used to ensure stability. 6. Small bilateral effusions with dependent atelectasis in the lower lobes. 7. Multiple bilateral renal cysts of varying size and signal intensity. Electronically Signed   By: Misty Stanley M.D.   On: 02/08/2021 15:06   MR ABDOMEN MRCP W WO CONTAST  Result Date: 02/08/2021 CLINICAL DATA:  Jaundice. Abnormal LFTs. Pancreatitis on previous recent CT EXAM: MRI ABDOMEN WITHOUT AND WITH CONTRAST (INCLUDING MRCP) TECHNIQUE: Multiplanar multisequence MR imaging of the abdomen was performed both before and after the administration of intravenous contrast. Heavily T2-weighted images of the biliary and pancreatic ducts were obtained, and three-dimensional MRCP images were rendered by post processing. CONTRAST:  7.27m GADAVIST GADOBUTROL 1 MMOL/ML IV SOLN COMPARISON:  CT scan 02/06/2021 FINDINGS: Lower chest: Small bilateral effusions with dependent atelectasis in the lower lobes. Heart size upper normal to mildly increased. Hepatobiliary: 2.0 cm T2 hyperintense lesion in the central right liver is hypointense on T1 imaging and flash  fills after IV contrast administration. Tiny nonenhancing T2 focus in the anterior left liver is too small to characterize but likely a cyst. Tiny gallstone measures 3-4 mm (axial T2 haste image 16/series 3. Mild fullness of the intrahepatic bile ducts evident common duct measures 6 mm diameter upper normal. There is gradual tapering of the common bile duct through the head of the pancreas into the ampulla. No evidence for choledocholithiasis. Pancreas: Motion degraded assessment shows an area of decreased perfusion in the head of pancreas with diffuse ill definition of pancreatic parenchyma. Peripancreatic fluid is similar to recent CT. Spleen:  No splenomegaly. No focal mass lesion. Adrenals/Urinary Tract: No adrenal nodule or mass. Multiple bilateral renal cysts of varying size and signal intensity noted. Stomach/Bowel: Stomach is unremarkable. No gastric wall thickening. No evidence of outlet obstruction. Duodenum is normally positioned as is the ligament of Treitz. No small bowel or colonic dilatation within the visualized abdomen. Vascular/Lymphatic: No abdominal aortic aneurysm. Portal vein is patent. There is marked mass-effect  on the portal splenic confluence and while attenuated, this does appear to remain patent. Splenic vein is attenuated in the region of the pancreatic body but remains patent. Celiac axis is patent although the trifurcation is not well demonstrated. There is no gastrohepatic or hepatoduodenal ligament lymphadenopathy. No retroperitoneal or mesenteric lymphadenopathy. Other: Free fluid is seen around the liver and spleen with retroperitoneal fluid and edema tracking in the transverse mesocolon and root of the small bowel mesentery. Musculoskeletal: No focal suspicious marrow enhancement within the visualized bony anatomy. IMPRESSION: 1. Motion degraded assessment shows ill definition of the pancreatic parenchyma in the head of pancreas with peripancreatic fluid and edema tracking in the  transverse mesocolon and root of the small bowel mesentery. Imaging features are consistent with acute pancreatitis. No rim enhancing fluid collection at this time. Small focus of potential decreased enhancement noted in the anterior pancreatic head raising the question of a small focus of pancreatic necrosis. 2. Mild fullness of the intrahepatic bile ducts with gradual tapering of the common bile duct through the head of the pancreas into the ampulla. No evidence for choledocholithiasis. No obstructing mass lesion evident. 3. New fluid seen between the lateral segment left liver and stomach with similar appearance of confluent fluid anterior to the head of the pancreas. 4. Cholelithiasis. 5. 2.0 cm T2 hyperintense lesion in the central right liver flash fills after IV contrast administration. This is likely benign and may be a hemangioma. Follow-up MRI in 3-6 months could be used to ensure stability. 6. Small bilateral effusions with dependent atelectasis in the lower lobes. 7. Multiple bilateral renal cysts of varying size and signal intensity. Electronically Signed   By: Misty Stanley M.D.   On: 02/08/2021 15:06        Scheduled Meds:  atorvastatin  20 mg Oral q1800   carvedilol  3.125 mg Oral BID WC   pantoprazole  40 mg Oral BID   tamsulosin  0.4 mg Oral QPC breakfast   Continuous Infusions:  chlorproMAZINE (THORAZINE) IV Stopped (02/06/21 0998)   lactated ringers 150 mL/hr at 02/09/21 0929   potassium chloride 10 mEq (02/09/21 0939)   sodium chloride       LOS: 8 days    Time spent: 25 minutes    Sidney Ace, MD [facility] Pager [phone]  If 7PM-7AM, please contact night-coverage 02/09/2021, 10:41 AM

## 2021-02-09 NOTE — Care Management (Signed)
Repeat T bili 4.5, slight downtrend with improving creatinine.  Case dw GI and surgery.  Patient feeling better now.  Had acute pain last night and sudden improvement suggesting passed stone.  Soft belly now, tolerating CLD  Plan Continue clears Repeat CMP in AM Appreciate assistance from GI and surgery  Ralene Muskrat MD

## 2021-02-09 NOTE — Progress Notes (Signed)
Patient ID: James Mora, male   DOB: [birth year], 85 y.o.   MRN: [id number]     [facility] Day(s): 8.   Interval History: Patient seen and examined, no acute events or new complaints overnight. Patient reports continued having intermittent abdominal pain.  This morning significant pain blood feel more bloated.  Denies any nausea or vomiting.  Patient continue with trending up of bilirubin.  MRCP done yesterday shows no sign of filling defect in the common bile duct.  There was tapering of the distal common bile duct.  Vital signs in last 24 hours: [min-max] current  Temp:  [98.6 F (37 C)-99.2 F (37.3 C)] 98.7 F (37.1 C) (07/04 0712) Pulse Rate:  [86-91] 89 (07/04 0712) Resp:  [16-20] 18 (07/04 0712) BP: (138-154)/(71-80) 145/74 (07/04 0712) SpO2:  [93 %-99 %] 98 % (07/04 0712)     Height: '5\' 10"'  (177.8 cm) Weight: 73.5 kg BMI (Calculated): 23.25   Physical Exam:  Constitutional: alert, cooperative and no distress  Respiratory: breathing non-labored at rest  Cardiovascular: regular rate and sinus rhythm  Gastrointestinal: soft, non-tender, and mild-distended   Imaging studies: MRCP did not shows filling defect in the common bile duct.  There was tapering of the distal CBD.   Assessment/Plan:  85 y.o. male with gallstone pancreatitis, complicated by pertinent comorbidities including hypertension, coronary artery disease, aortic insufficiency.  Patient with acute pancreatitis.  Still does not seem to be resolving completely.  He continued with intermittent abdominal pain.  Today more bloated.  Last CT scan when the patient was also bloated like today there was no bowel dilation.  Patient not nauseous.  Patient trying clear liquid diet.  If patient does not tolerate clear liquids I would recommend to consider TPN.  Case was discussed at length with hospitalist and gastroenterology service.  The fact that the patient continue with persistent abdominal pain and the  bilirubin trending up with the last direct bilirubin elevated even though there was no filling defect in the common bile duct may be there has been an extrinsic compression from the edema.  If this is the case and the bilirubin continue to trend up patient might need some type of decompression of the common bile duct (ERCP versus PTC).  Another alternative for evaluation of possible cause of elevated bilirubin recommended by GI was endoscopic ultrasound.  We do not have that modality in our hospital.  If patient does not improve with adequate hydration and electrolyte replacement I would recommend to consider transferring the patient to a higher level institution for further evaluation and management.  No surgical management will be of benefit for this patient at this moment.  I will continue to remain aware.  Arnold Long, MD

## 2021-02-10 ENCOUNTER — Other Ambulatory Visit: Payer: Self-pay

## 2021-02-10 ENCOUNTER — Encounter: Payer: Self-pay | Admitting: Urology

## 2021-02-10 LAB — CBC WITH DIFFERENTIAL/PLATELET
Abs Immature Granulocytes: 0.14 10*3/uL — ABNORMAL HIGH (ref 0.00–0.07)
Basophils Absolute: 0 10*3/uL (ref 0.0–0.1)
Basophils Relative: 0 %
Eosinophils Absolute: 0 10*3/uL (ref 0.0–0.5)
Eosinophils Relative: 0 %
HCT: 30.2 % — ABNORMAL LOW (ref 39.0–52.0)
Hemoglobin: 10.2 g/dL — ABNORMAL LOW (ref 13.0–17.0)
Immature Granulocytes: 1 %
Lymphocytes Relative: 3 %
Lymphs Abs: 0.5 10*3/uL — ABNORMAL LOW (ref 0.7–4.0)
MCH: 30.7 pg (ref 26.0–34.0)
MCHC: 33.8 g/dL (ref 30.0–36.0)
MCV: 91 fL (ref 80.0–100.0)
Monocytes Absolute: 0.8 10*3/uL (ref 0.1–1.0)
Monocytes Relative: 5 %
Neutro Abs: 13.5 10*3/uL — ABNORMAL HIGH (ref 1.7–7.7)
Neutrophils Relative %: 91 %
Platelets: 243 10*3/uL (ref 150–400)
RBC: 3.32 MIL/uL — ABNORMAL LOW (ref 4.22–5.81)
RDW: 14.5 % (ref 11.5–15.5)
Smear Review: NORMAL
WBC: 15 10*3/uL — ABNORMAL HIGH (ref 4.0–10.5)
nRBC: 0 % (ref 0.0–0.2)

## 2021-02-10 LAB — COMPREHENSIVE METABOLIC PANEL
ALT: 22 U/L (ref 0–44)
AST: 55 U/L — ABNORMAL HIGH (ref 15–41)
Albumin: 1.8 g/dL — ABNORMAL LOW (ref 3.5–5.0)
Alkaline Phosphatase: 71 U/L (ref 38–126)
Anion gap: 7 (ref 5–15)
BUN: 22 mg/dL (ref 8–23)
CO2: 30 mmol/L (ref 22–32)
Calcium: 7.3 mg/dL — ABNORMAL LOW (ref 8.9–10.3)
Chloride: 100 mmol/L (ref 98–111)
Creatinine, Ser: 1.24 mg/dL (ref 0.61–1.24)
GFR, Estimated: 58 mL/min — ABNORMAL LOW (ref >60)
Glucose, Bld: 99 mg/dL (ref 70–99)
Potassium: 3.3 mmol/L — ABNORMAL LOW (ref 3.5–5.1)
Sodium: 137 mmol/L (ref 135–145)
Total Bilirubin: 5.1 mg/dL — ABNORMAL HIGH (ref 0.3–1.2)
Total Protein: 4.9 g/dL — ABNORMAL LOW (ref 6.5–8.1)

## 2021-02-10 MED ORDER — POTASSIUM CHLORIDE CRYS ER 20 MEQ PO TBCR
40.0000 meq | EXTENDED_RELEASE_TABLET | Freq: Once | ORAL | Status: AC
  Administered 2021-02-10: 09:00:00 40 meq via ORAL
  Filled 2021-02-10: qty 2

## 2021-02-10 MED ORDER — ADULT MULTIVITAMIN W/MINERALS CH: 1.0000 | ORAL_TABLET | Freq: Every day | ORAL | Status: DC

## 2021-02-10 MED ORDER — BOOST / RESOURCE BREEZE PO LIQD CUSTOM
1.0000 | Freq: Three times a day (TID) | ORAL | Status: DC
  Administered 2021-02-10: 14:00:00 1 via ORAL

## 2021-02-10 MED ORDER — SENNOSIDES-DOCUSATE SODIUM 8.6-50 MG PO TABS
1.0000 | ORAL_TABLET | Freq: Two times a day (BID) | ORAL | Status: DC
  Administered 2021-02-10: 1 via ORAL
  Filled 2021-02-10: qty 1

## 2021-02-10 NOTE — Progress Notes (Signed)
Patient ID: James Mora, male   DOB: 06/19/1936, 85 y.o.   MRN: [id number]     [facility] Day(s): 9.   Interval History: Patient seen and examined, no acute events or new complaints overnight. Patient reports continued having treatment and pain.  Pain mostly on the epigastric area.  Patient also reports feeling bloated.  Denies fever or chills.  Vital signs in last 24 hours: [min-max] current  Temp:  [98.2 F (36.8 C)-99.5 F (37.5 C)] 98.5 F (36.9 C) (07/05 1148) Pulse Rate:  [80-94] 80 (07/05 1148) Resp:  [16-20] 18 (07/05 1148) BP: (129-159)/(67-81) 129/67 (07/05 1148) SpO2:  [96 %-99 %] 96 % (07/05 1148)     Height: 5\' 10"  (177.8 cm) Weight: 73.5 kg BMI (Calculated): 23.25   Physical Exam:  Constitutional: alert, cooperative and no distress  Respiratory: breathing non-labored at rest  Cardiovascular: regular rate and sinus rhythm  Gastrointestinal: soft, tender in the epigastric area, and distended  Labs:  CBC Latest Ref Rng & Units 02/10/2021 02/09/2021 02/08/2021  WBC 4.0 - 10.5 K/uL 15.0(H) 18.7(H) 16.7(H)  Hemoglobin 13.0 - 17.0 g/dL 10.2(L) 10.7(L) 10.8(L)  Hematocrit 39.0 - 52.0 % 30.2(L) 31.8(L) 32.2(L)  Platelets 150 - 400 K/uL 243 234 184   CMP Latest Ref Rng & Units 02/10/2021 02/09/2021 02/09/2021  Glucose 70 - 99 mg/dL 99 - 117(H)  BUN 8 - 23 mg/dL 22 - 26(H)  Creatinine 0.61 - 1.24 mg/dL 1.24 - 1.41(H)  Sodium 135 - 145 mmol/L 137 - 138  Potassium 3.5 - 5.1 mmol/L 3.3(L) - 3.4(L)  Chloride 98 - 111 mmol/L 100 - 101  CO2 22 - 32 mmol/L 30 - 32  Calcium 8.9 - 10.3 mg/dL 7.3(L) - 7.3(L)  Total Protein 6.5 - 8.1 g/dL 4.9(L) - 5.1(L)  Total Bilirubin 0.3 - 1.2 mg/dL 5.1(H) 4.4(H) 4.5(H)  Alkaline Phos 38 - 126 U/L 71 - 67  AST 15 - 41 U/L 55(H) - 41  ALT 0 - 44 U/L 22 - 17    Imaging studies: No new pertinent imaging studies   Assessment/Plan:  85 y.o. male with gallstone pancreatitis, complicated by pertinent comorbidities including  hypertension, coronary artery disease, aortic insufficiency.  Patient today continue with epigastric pain.  Bilirubin continue increasing trend.  There is concern of choledocholithiasis not identified on MRCP versus large on the common bile duct versus external compression of head of the pancreas edema.  I do think that is reasonable to proceed with cholecystectomy under this condition since it subsequently resolved the cause of the hyperbilirubinemia neither the patient pain that is caused by pancreatitis in addition to the possible common bile duct obstruction.  The only reason for cholecystectomy at this moment will be to prevent future episode of proctitis and not therapeutic at this moment.  I agree with the recommendation of endoscopic ultrasound for further evaluation of the cause of hyperbilirubinemia.  I will defer GI for decision regarding possible ERCP if bilirubin continues to increase.  Agree with dietitian recommendation of pyloric enteral feeding versus TPN.  I will continue to follow.  Arnold Long, MD

## 2021-02-10 NOTE — Discharge Summary (Signed)
Physician Discharge Summary  James Mora UUV:[id number] DOB: 1936-07-04 DOA: 02/01/2021  PCP: [name], PA-C  Admit date: 02/01/2021 Discharge date: 02/10/2021  Admitted From: Home Disposition: Transfer to [facility]: N/A Equipment/Devices: Oxygen 2 L via [place]  Discharge Condition: Stable CODE STATUS: Full Diet recommendation: Clear liquids  Brief/Interim Summary: Patient admitted 02/01/2021 with nausea vomiting abdominal pain and diarrhea.  He was diagnosed with acute pancreatitis with gallstones and enteritis seen on CT scan.  Patient was started on PPI therapy.  General surgery and gastroenterology consulted.  Lipase has trended better and general surgery will talk to the patient about removing the gallbladder.   Patient was initially scheduled for cholecystectomy on 6/29.  However in the morning he was complaining of acutely worsened abdominal pain.  Unclear etiology.  Lipase and troponin both reassuring.  Discussed with general surgery.  Cholecystectomy on hold for now pending improvement in pain control.   Work-up included repeat lipase which is trending down as well as troponin which is not indicative of acute coronary syndrome.   On 7/1 patient again rescheduled for cholecystectomy however he has increasing abdominal pain with increasing work of breathing.  Abdomen is mildly distended.  Discussed with general surgery and cholecystectomy on hold and likely will be deferred to outpatient in 4 to 6 weeks.  We will pursue CT abdomen pelvis to evaluate for possible pancreatic fluid collection versus on pancreatitis related ileus   7/2: Abdominal pain persistent.  CT abdomen pelvis with worsening peripancreatic edema consistent with persistent pancreatitis.  Surgical plan on hold.  Will not pursue surgical intervention during this admission.  Patient also endorsing some shortness of breath and exam notable for end expiratory wheeze.  Bibasilar crackles  noted on exam.   7/3: Mildly increased bilirubin.  Patient remained stable however uptrending leukocytosis and persistent abdominal pain.  General surgery ordered MRCP.   7/4: Bilirubin continues to increase.  MRCP negative for CBD stone however did show some distal tapering of the common bile duct.  Could be due to extrinsic compression from edema versus sphincter of Oddi dysfunction ampullary stone not seen on imaging.  Patient with persistent abdominal pain and distention.  Case discussed at length with general surgery and GI.  If bilirubin continues to trend up may need transfer to higher level of care for definitive management and advanced endoscopic services not available at this facility   7/5: Patient feels better, leukocytosis and kidney function are improving however bilirubin (total) increased to 5.1.  Case discussed with GI.  Still concern for small obstructive stone.  Per GI next step would be endoscopic ultrasound not available at this facility.  GI spoke to advanced endoscopist at [facility] Dr. Cephas Darby who agreed to see patient in consultation.  I spoke to hospitalist at [facility] transfer center who accepted the patient to a intermediate care bed.  Discharge Diagnoses:  Active Problems:   Benign prostatic hyperplasia without lower urinary tract symptoms   Acute pancreatitis   Gallstones   Enteritis   Gallstone pancreatitis   Shortness of breath  Acute gallstone pancreatitis Hyperbilirubinemia Persistent imaging evidence of pancreatitis despite normalization of lipase Worsening leukocytosis Uptrending bilirubin Increasing abdominal pain associated with distention CT abdomen pelvis, worsening peripancreatic edema MRCP with persistent peripancreatic edema.  Negative for CBD stone.  Distal tapering of CBD with uptrending bilirubin -7/5: Uptrending bilirubin raises a concern of possible small stone.  Per GI next  step would be endoscopic ultrasound not available  at this  facility. Plan: Patient still with a distended belly and clinical signs consistent with acute pancreatitis.  GI feels there may still be a small gallstone not visualized on MRCP.  Recommending transfer to higher level of care for endoscopic ultrasound.  Patient was accepted by to transfer center.  Will go to medical service with GI consultation.  Accepted to intermediate care bed.  Consulting GI at [facility] will be Dr. Cephas Darby   In the interim we will resume clear liquid diet.  If there is delay in transfer suggest consideration for fluoroscopic guided postpyloric NG tube and initiation of postpyloric tube feeds.   Acute hypoxic respiratory failure Patient endorsing seeing dyspnea on exertion.  New oxygen requirement 2L.  Chest x-ray ordered on 7/2 demonstrates bibasilar atelectasis without edema or effusion.  I suspect this is secondary to splinting from pain. Plan: No indication for diuretics.  Continue to encourage incentive spirometry use.  Encourage ambulation exertion as much as tolerated   Hematuria Noted on 7/1.  Resolved spontaneously without intervention.  No stones noted on CAT scan though this was done with imaging.  May be a passed stone. Hemoglobin has been stable Hematuria resolved Plan: Monitor UOP and daily CBC If patient starts to pass clots or hematuria becomes persistent we will involve urology and consider CBI   Acute kidney injury on chronic kidney disease stage IIIa Baseline creatinine 1.64 Creatinine peaked at 2.23, subsequently downtrending Worsening kidney function on 7/4.  Suspect possible contrast-induced nephropathy Improved after IV fluid rate escalated Plan: Continue aggressive IV fluid resuscitation for now, lactated Ringer's 150 cc/h. Avoid nonessential nephrotoxins   Acute enteritis on CAT scan Suspect secondary inflammation from acute pancreatitis Continue IV PPI and pain control   History of coronary disease Hyperlipidemia Echocardiogram reassuring No  further inpatient cardiac diagnostics Plan: Continue Coreg and atorvastatin Continue telemetry monitoring   Essential hypertension Well-controlled over interval Continue Coreg and enalapril   GERD PPI   BPH Flomax    Discharge Instructions  Discharge Instructions     Diet - low sodium heart healthy   Complete by: As directed    Increase activity slowly   Complete by: As directed       Allergies as of 02/10/2021   No Known Allergies      Medication List     TAKE these medications    Advil 200 MG tablet Generic drug: ibuprofen Take 200 mg by mouth every 6 (six) hours as needed for moderate pain.   aspirin 81 MG tablet Take 81 mg by mouth daily.   atorvastatin 20 MG tablet Commonly known as: LIPITOR Take 1 tablet (20 mg total) by mouth daily at 6 PM.   bismuth subsalicylate 417 EY/81KG suspension Commonly known as: PEPTO BISMOL Take 30 mLs by mouth every 6 (six) hours as needed (for nausea, and vomiting.).   carvedilol 12.5 MG tablet Commonly known as: COREG TAKE 1 TABLET(12.5 MG) BY MOUTH TWICE DAILY   chlorthalidone 25 MG tablet Commonly known as: HYGROTON TAKE 1 TABLET(25 MG) BY MOUTH DAILY   enalapril 20 MG tablet Commonly known as: VASOTEC TAKE 1 TABLET(20 MG) BY MOUTH DAILY   Imodium A-D 2 MG tablet Generic drug: loperamide Take 2 mg by mouth 4 (four) times daily as needed for diarrhea or loose stools.   nitroGLYCERIN 0.4 MG SL tablet Commonly known as: NITROSTAT Place 0.4 mg under the tongue every 5 (five) minutes as needed.   omeprazole 20 MG capsule Commonly known as: PRILOSEC  TAKE 2 CAPSULES BY MOUTH EVERY DAY       ASK your doctor about these medications    silodosin 8 MG Caps capsule Commonly known as: RAPAFLO Take 1 capsule (8 mg total) by mouth daily with breakfast.        No Known Allergies  Consultations: GI General surgery   Procedures/Studies: CT ABDOMEN PELVIS W CONTRAST  Result Date: 02/06/2021 CLINICAL  DATA:  Pancreatitis. EXAM: CT ABDOMEN AND PELVIS WITH CONTRAST TECHNIQUE: Multidetector CT imaging of the abdomen and pelvis was performed using the standard protocol following bolus administration of intravenous contrast. CONTRAST:  21mL OMNIPAQUE IOHEXOL 300 MG/ML  SOLN COMPARISON:  February 01, 2021. FINDINGS: Lower chest: Small bilateral pleural effusions are noted with adjacent subsegmental atelectasis. Hepatobiliary: Cholelithiasis is noted. No biliary dilatation is noted. The liver is unremarkable. Pancreas: Significantly increased inflammation is seen involving the pancreatic head and body consistent with worsening pancreatitis. No defined pseudocyst formation is seen at this time. Spleen: Normal in size without focal abnormality. Adrenals/Urinary Tract: Adrenal glands appear normal. Bilateral renal cysts are noted. No hydronephrosis or renal obstruction is noted. No renal or ureteral calculi are noted. Urinary bladder is unremarkable. Stomach/Bowel: The stomach appears normal. Wall and fold thickening of the duodenum is noted which most likely is secondary to adjacent pancreatitis. There is no evidence of bowel obstruction. Sigmoid diverticulosis is noted without inflammation. Vascular/Lymphatic: Aortic atherosclerosis. No enlarged abdominal or pelvic lymph nodes. Reproductive: Moderate prostatic enlargement is noted. Other: Small bilateral fat containing inguinal hernias are noted. Mild amount of free fluid is noted in the pelvis and right pericolic gutter. Musculoskeletal: No acute or significant osseous findings. IMPRESSION: Significantly increased inflammatory stranding is noted around the pancreatic head and body consistent with worsening pancreatitis. No definite pseudocyst formation is seen at this time. Wall and fold thickening of the duodenum is noted which most likely is secondary to adjacent pancreatitis. Small bilateral pleural effusions are noted with adjacent subsegmental atelectasis. Mild amount  of free fluid is noted in the pelvis and right pericolic gutter, most likely related to pancreatitis. Moderate prostatic enlargement. Aortic Atherosclerosis (ICD10-I70.0). Electronically Signed   By: Marijo Conception M.D.   On: 02/06/2021 15:58   MR 3D Recon At Scanner  Result Date: 02/08/2021 CLINICAL DATA:  Jaundice. Abnormal LFTs. Pancreatitis on previous recent CT EXAM: MRI ABDOMEN WITHOUT AND WITH CONTRAST (INCLUDING MRCP) TECHNIQUE: Multiplanar multisequence MR imaging of the abdomen was performed both before and after the administration of intravenous contrast. Heavily T2-weighted images of the biliary and pancreatic ducts were obtained, and three-dimensional MRCP images were rendered by post processing. CONTRAST:  7.28mL GADAVIST GADOBUTROL 1 MMOL/ML IV SOLN COMPARISON:  CT scan 02/06/2021 FINDINGS: Lower chest: Small bilateral effusions with dependent atelectasis in the lower lobes. Heart size upper normal to mildly increased. Hepatobiliary: 2.0 cm T2 hyperintense lesion in the central right liver is hypointense on T1 imaging and flash fills after IV contrast administration. Tiny nonenhancing T2 focus in the anterior left liver is too small to characterize but likely a cyst. Tiny gallstone measures 3-4 mm (axial T2 haste image 16/series 3. Mild fullness of the intrahepatic bile ducts evident common duct measures 6 mm diameter upper normal. There is gradual tapering of the common bile duct through the head of the pancreas into the ampulla. No evidence for choledocholithiasis. Pancreas: Motion degraded assessment shows an area of decreased perfusion in the head of pancreas with diffuse ill definition of pancreatic parenchyma. Peripancreatic fluid is similar to recent  CT. Spleen:  No splenomegaly. No focal mass lesion. Adrenals/Urinary Tract: No adrenal nodule or mass. Multiple bilateral renal cysts of varying size and signal intensity noted. Stomach/Bowel: Stomach is unremarkable. No gastric wall thickening.  No evidence of outlet obstruction. Duodenum is normally positioned as is the ligament of Treitz. No small bowel or colonic dilatation within the visualized abdomen. Vascular/Lymphatic: No abdominal aortic aneurysm. Portal vein is patent. There is marked mass-effect on the portal splenic confluence and while attenuated, this does appear to remain patent. Splenic vein is attenuated in the region of the pancreatic body but remains patent. Celiac axis is patent although the trifurcation is not well demonstrated. There is no gastrohepatic or hepatoduodenal ligament lymphadenopathy. No retroperitoneal or mesenteric lymphadenopathy. Other: Free fluid is seen around the liver and spleen with retroperitoneal fluid and edema tracking in the transverse mesocolon and root of the small bowel mesentery. Musculoskeletal: No focal suspicious marrow enhancement within the visualized bony anatomy. IMPRESSION: 1. Motion degraded assessment shows ill definition of the pancreatic parenchyma in the head of pancreas with peripancreatic fluid and edema tracking in the transverse mesocolon and root of the small bowel mesentery. Imaging features are consistent with acute pancreatitis. No rim enhancing fluid collection at this time. Small focus of potential decreased enhancement noted in the anterior pancreatic head raising the question of a small focus of pancreatic necrosis. 2. Mild fullness of the intrahepatic bile ducts with gradual tapering of the common bile duct through the head of the pancreas into the ampulla. No evidence for choledocholithiasis. No obstructing mass lesion evident. 3. New fluid seen between the lateral segment left liver and stomach with similar appearance of confluent fluid anterior to the head of the pancreas. 4. Cholelithiasis. 5. 2.0 cm T2 hyperintense lesion in the central right liver flash fills after IV contrast administration. This is likely benign and may be a hemangioma. Follow-up MRI in 3-6 months could be  used to ensure stability. 6. Small bilateral effusions with dependent atelectasis in the lower lobes. 7. Multiple bilateral renal cysts of varying size and signal intensity. Electronically Signed   By: Misty Stanley M.D.   On: 02/08/2021 15:06   DG Chest Port 1 View  Result Date: 02/07/2021 CLINICAL DATA:  Hypoxia.  Gallstone pancreatitis. EXAM: PORTABLE CHEST 1 VIEW COMPARISON:  02/01/2021 FINDINGS: The heart is enlarged. There is streaky atelectasis at both lung bases. No focal consolidations or pleural effusions. No pulmonary edema. IMPRESSION: Stable cardiomegaly. Electronically Signed   By: Nolon Nations M.D.   On: 02/07/2021 12:17   DG Chest Portable 1 View  Result Date: 02/01/2021 CLINICAL DATA:  Epigastric pain. EXAM: PORTABLE CHEST 1 VIEW COMPARISON:  March 19, 2016 FINDINGS: Stable cardiomegaly. Stable atelectasis in the left base. No pneumothorax. No other interval changes or acute abnormalities. IMPRESSION: No active disease. Electronically Signed   By: Dorise Bullion III M.D   On: 02/01/2021 18:54   MR ABDOMEN MRCP W WO CONTAST  Result Date: 02/08/2021 CLINICAL DATA:  Jaundice. Abnormal LFTs. Pancreatitis on previous recent CT EXAM: MRI ABDOMEN WITHOUT AND WITH CONTRAST (INCLUDING MRCP) TECHNIQUE: Multiplanar multisequence MR imaging of the abdomen was performed both before and after the administration of intravenous contrast. Heavily T2-weighted images of the biliary and pancreatic ducts were obtained, and three-dimensional MRCP images were rendered by post processing. CONTRAST:  7.67mL GADAVIST GADOBUTROL 1 MMOL/ML IV SOLN COMPARISON:  CT scan 02/06/2021 FINDINGS: Lower chest: Small bilateral effusions with dependent atelectasis in the lower lobes. Heart size upper normal  to mildly increased. Hepatobiliary: 2.0 cm T2 hyperintense lesion in the central right liver is hypointense on T1 imaging and flash fills after IV contrast administration. Tiny nonenhancing T2 focus in the anterior left  liver is too small to characterize but likely a cyst. Tiny gallstone measures 3-4 mm (axial T2 haste image 16/series 3. Mild fullness of the intrahepatic bile ducts evident common duct measures 6 mm diameter upper normal. There is gradual tapering of the common bile duct through the head of the pancreas into the ampulla. No evidence for choledocholithiasis. Pancreas: Motion degraded assessment shows an area of decreased perfusion in the head of pancreas with diffuse ill definition of pancreatic parenchyma. Peripancreatic fluid is similar to recent CT. Spleen:  No splenomegaly. No focal mass lesion. Adrenals/Urinary Tract: No adrenal nodule or mass. Multiple bilateral renal cysts of varying size and signal intensity noted. Stomach/Bowel: Stomach is unremarkable. No gastric wall thickening. No evidence of outlet obstruction. Duodenum is normally positioned as is the ligament of Treitz. No small bowel or colonic dilatation within the visualized abdomen. Vascular/Lymphatic: No abdominal aortic aneurysm. Portal vein is patent. There is marked mass-effect on the portal splenic confluence and while attenuated, this does appear to remain patent. Splenic vein is attenuated in the region of the pancreatic body but remains patent. Celiac axis is patent although the trifurcation is not well demonstrated. There is no gastrohepatic or hepatoduodenal ligament lymphadenopathy. No retroperitoneal or mesenteric lymphadenopathy. Other: Free fluid is seen around the liver and spleen with retroperitoneal fluid and edema tracking in the transverse mesocolon and root of the small bowel mesentery. Musculoskeletal: No focal suspicious marrow enhancement within the visualized bony anatomy. IMPRESSION: 1. Motion degraded assessment shows ill definition of the pancreatic parenchyma in the head of pancreas with peripancreatic fluid and edema tracking in the transverse mesocolon and root of the small bowel mesentery. Imaging features are  consistent with acute pancreatitis. No rim enhancing fluid collection at this time. Small focus of potential decreased enhancement noted in the anterior pancreatic head raising the question of a small focus of pancreatic necrosis. 2. Mild fullness of the intrahepatic bile ducts with gradual tapering of the common bile duct through the head of the pancreas into the ampulla. No evidence for choledocholithiasis. No obstructing mass lesion evident. 3. New fluid seen between the lateral segment left liver and stomach with similar appearance of confluent fluid anterior to the head of the pancreas. 4. Cholelithiasis. 5. 2.0 cm T2 hyperintense lesion in the central right liver flash fills after IV contrast administration. This is likely benign and may be a hemangioma. Follow-up MRI in 3-6 months could be used to ensure stability. 6. Small bilateral effusions with dependent atelectasis in the lower lobes. 7. Multiple bilateral renal cysts of varying size and signal intensity. Electronically Signed   By: Misty Stanley M.D.   On: 02/08/2021 15:06   ECHOCARDIOGRAM COMPLETE  Result Date: 02/03/2021    ECHOCARDIOGRAM REPORT   Patient Name:   James Mora Date of Exam: 02/03/2021 Medical Rec #:  [id number]    Height:       70.0 in Accession #:    [id number]   Weight:       162.0 lb Date of Birth:  Mar 28, 1936    BSA:          1.909 m Patient Age:    39 years     BP:           129/81 mmHg Patient Gender: M  HR:           101 bpm. Exam Location:  [facility] Procedure: 2D Echo, Color Doppler and Cardiac Doppler Indications:     I35.1 Aortic regurgitation  History:         Patient has prior history of Echocardiogram examinations. CAD;                  Risk Factors:Hypertension and Dyslipidemia.  Sonographer:     Charmayne Sheer RDCS (AE) Referring Phys:  [id number] Rise Mu Diagnosing Phys: Nelva Bush MD  Sonographer Comments: Suboptimal parasternal window and no subcostal window. Image acquisition challenging due to respiratory  motion. IMPRESSIONS  1. Left ventricular ejection fraction, by estimation, is 70 to 75%. The left ventricle has hyperdynamic function. The left ventricle has no regional wall motion abnormalities. Left ventricular diastolic parameters were normal.  2. Right ventricular systolic function is normal. The right ventricular size is mildly enlarged. Tricuspid regurgitation signal is inadequate for assessing PA pressure.  3. The mitral valve is grossly normal. No evidence of mitral valve regurgitation. No evidence of mitral stenosis.  4. The aortic valve is tricuspid. Aortic valve regurgitation is mild. Mild aortic valve sclerosis is present, with no evidence of aortic valve stenosis.  5. Aortic dilatation noted. There is mild dilatation of the aortic root, measuring 41 mm. FINDINGS  Left Ventricle: Left ventricular ejection fraction, by estimation, is 70 to 75%. The left ventricle has hyperdynamic function. The left ventricle has no regional wall motion abnormalities. The left ventricular internal cavity size was normal in size. There is borderline left ventricular hypertrophy. Left ventricular diastolic parameters were normal. Right Ventricle: The right ventricular size is mildly enlarged. No increase in right ventricular wall thickness. Right ventricular systolic function is normal. Tricuspid regurgitation signal is inadequate for assessing PA pressure. Left Atrium: Left atrial size was normal in size. Right Atrium: Right atrial size was not well visualized. Pericardium: The pericardium was not well visualized. Mitral Valve: The mitral valve is grossly normal. No evidence of mitral valve regurgitation. No evidence of mitral valve stenosis. MV peak gradient, 2.8 mmHg. The mean mitral valve gradient is 1.0 mmHg. Tricuspid Valve: The tricuspid valve is grossly normal. Tricuspid valve regurgitation is mild. Aortic Valve: The aortic valve is tricuspid. Aortic valve regurgitation is mild. Aortic regurgitation PHT measures 525  msec. Mild aortic valve sclerosis is present, with no evidence of aortic valve stenosis. Aortic valve mean gradient measures 5.0 mmHg. Aortic valve peak gradient measures 9.5 mmHg. Aortic valve area, by VTI measures 2.29 cm. Pulmonic Valve: The pulmonic valve was not well visualized. Pulmonic valve regurgitation is not visualized. No evidence of pulmonic stenosis. Aorta: Aortic dilatation noted. There is mild dilatation of the aortic root, measuring 41 mm. Pulmonary Artery: The pulmonary artery is not well seen. Venous: The inferior vena cava was not well visualized. IAS/Shunts: The interatrial septum was not well visualized.  LEFT VENTRICLE PLAX 2D LVIDd:         4.50 cm  Diastology LVIDs:         3.50 cm  LV e' medial:    8.81 cm/s LV PW:         1.13 cm  LV E/e' medial:  5.1 LV IVS:        1.00 cm  LV e' lateral:   9.14 cm/s LVOT diam:     2.20 cm  LV E/e' lateral: 4.9 LV SV:         51 LV SV Index:  27 LVOT Area:     3.80 cm  LEFT ATRIUM             Index LA diam:        4.10 cm 2.15 cm/m LA Vol (A2C):   35.7 ml 18.70 ml/m LA Vol (A4C):   43.1 ml 22.58 ml/m LA Biplane Vol: 40.2 ml 21.06 ml/m  AORTIC VALVE                    PULMONIC VALVE AV Area (Vmax):    2.40 cm     PV Vmax:       1.13 m/s AV Area (Vmean):   2.45 cm     PV Vmean:      71.900 cm/s AV Area (VTI):     2.29 cm     PV VTI:        0.150 m AV Vmax:           154.00 cm/s  PV Peak grad:  5.1 mmHg AV Vmean:          104.000 cm/s PV Mean grad:  2.0 mmHg AV VTI:            0.222 m AV Peak Grad:      9.5 mmHg AV Mean Grad:      5.0 mmHg LVOT Vmax:         97.30 cm/s LVOT Vmean:        67.100 cm/s LVOT VTI:          0.134 m LVOT/AV VTI ratio: 0.60 AI PHT:            525 msec  AORTA Ao Root diam: 4.10 cm MITRAL VALVE MV Area (PHT): 5.42 cm    SHUNTS MV Area VTI:   2.89 cm    Systemic VTI:  0.13 m MV Peak grad:  2.8 mmHg    Systemic Diam: 2.20 cm MV Mean grad:  1.0 mmHg MV Vmax:       0.84 m/s MV Vmean:      48.4 cm/s MV Decel Time: 140 msec MV E  velocity: 45.00 cm/s MV A velocity: 81.40 cm/s MV E/A ratio:  0.55 Harrell Gave End MD Electronically signed by Nelva Bush MD Signature Date/Time: 02/03/2021/2:51:50 PM    Final    CT Angio Abd/Pel W and/or Wo Contrast  Result Date: 02/01/2021 CLINICAL DATA:  Abdominal pain and emesis. EXAM: CTA ABDOMEN AND PELVIS WITHOUT AND WITH CONTRAST TECHNIQUE: Multidetector CT imaging of the abdomen and pelvis was performed using the standard protocol during bolus administration of intravenous contrast. Multiplanar reconstructed images and MIPs were obtained and reviewed to evaluate the vascular anatomy. CONTRAST:  25mL OMNIPAQUE IOHEXOL 350 MG/ML SOLN COMPARISON:  None. FINDINGS: VASCULAR Aorta: Normal caliber aorta without aneurysm, dissection, vasculitis or significant stenosis. Celiac: Patent without evidence of aneurysm, dissection, vasculitis or significant stenosis. SMA: Patent without evidence of aneurysm, dissection, vasculitis or significant stenosis. Renals: Both renal arteries are patent without evidence of aneurysm, dissection, vasculitis, fibromuscular dysplasia or significant stenosis. IMA: Patent without evidence of aneurysm, dissection, vasculitis or significant stenosis. Inflow: Patent without evidence of aneurysm, dissection, vasculitis or significant stenosis. Proximal Outflow: Bilateral common femoral and visualized portions of the superficial and profunda femoral arteries are patent without evidence of aneurysm, dissection, vasculitis or significant stenosis. Veins: No obvious venous abnormality within the limitations of this arterial phase study. Review of the MIP images confirms the above findings. NON-VASCULAR Lower chest: No acute abnormality. Hepatobiliary: There is diffuse  fatty infiltration of the liver parenchyma. No focal liver abnormality is seen. A 7 mm gallstone is seen within the gallbladder lumen, without evidence of gallbladder wall thickening or biliary dilatation. Pancreas:  Peripancreatic inflammatory fat stranding is seen. This also involves the adjacent duodenum and extends along the midline of the mid to lower abdomen. There is no evidence of pancreatic duct dilatation. Spleen: Normal in size without focal abnormality. Adrenals/Urinary Tract: Adrenal glands are unremarkable. Numerous bilateral simple renal cysts of various sizes are seen. There is no evidence of renal calculi or hydronephrosis. Bladder is unremarkable. Stomach/Bowel: There is a small hiatal hernia. Appendix appears normal. No evidence of bowel dilatation. Marked severity, asymmetric thickening of the proximal and mid duodenum is seen. Associated Peri duodenal inflammatory fat stranding is noted. Noninflamed diverticula are seen throughout the sigmoid colon. Lymphatic: No abnormal abdominal or pelvic lymph nodes are identified. Reproductive: Moderate to marked severity prostate gland enlargement is seen. Other: A 3.0 cm x 2.2 cm fat containing right inguinal hernia is seen. A similar appearing 2.0 cm x 1.7 cm fat containing left inguinal hernia is noted. No abdominopelvic ascites. Musculoskeletal: Multilevel degenerative changes are seen throughout the lumbar spine. IMPRESSION: VASCULAR No evidence of aneurysm, dissection, vasculitis or significant stenosis of the arterial structures within the abdomen or pelvis. NON-VASCULAR 1. Marked severity enteritis involving the proximal and mid duodenum. 2. Findings consistent with adjacent acute pancreatitis cannot be excluded. Correlation with pancreatic enzymes is recommended. 3. Cholelithiasis. 4. Numerous bilateral simple renal cysts. 5. Small hiatal hernia. 6. Sigmoid diverticulosis. Electronically Signed   By: Virgina Norfolk M.D.   On: 02/01/2021 19:07   US Abdomen Limited RUQ (LIVER/GB)  Result Date: 02/03/2021 CLINICAL DATA:  Gallstone. Pancreatitis. Evaluate common bile duct. EXAM: ULTRASOUND ABDOMEN LIMITED RIGHT UPPER QUADRANT COMPARISON:  CT 02/01/2021  FINDINGS: Gallbladder: Borderline minimal gallbladder wall thickening. Single visible gallstone measuring 1 cm in diameter. Small amount of gallbladder sludge. Patient is somewhat tender in this area, difficult to interpret as a Murphy sign given the presence of pancreatitis. Common bile duct: Diameter: 3 mm. Normal. No evidence of dilatation or choledocholithiasis. Liver: 1.5 cm hypoechoic area in the medial right lobe probably represents a venous varix based on the previous CT. This is not clinically significant. Liver parenchyma otherwise is normal. Portal vein is patent on color Doppler imaging with normal direction of blood flow towards the liver. Other: Small amount of adjacent ascitic fluid. IMPRESSION: 1 cm gallstone. Mild gallbladder wall thickening. Common duct is normal. Patient is tender in the region. This may be due to the pancreatitis shown by CT. Cannot rule out a true [place] sign, though that is felt less likely. Consider correlation with nuclear medicine hepatobiliary scan if concern persists regarding the possibility of cholecystitis. Electronically Signed   By: Nelson Chimes M.D.   On: 02/03/2021 11:23   (Echo, Carotid, EGD, Colonoscopy, ERCP)    Subjective: Patient seen and examined on the day of discharge.  Still with distended belly but pain well controlled.  Hemodynamically stable.  Discharge Exam: Vitals:   02/10/21 0732 02/10/21 1148  BP: (!) 159/74 129/67  Pulse: 87 80  Resp: 20 18  Temp: 98.2 F (36.8 C) 98.5 F (36.9 C)  SpO2: 99% 96%   Vitals:   02/10/21 0138 02/10/21 0538 02/10/21 0732 02/10/21 1148  BP: 133/78 (!) 144/81 (!) 159/74 129/67  Pulse: 86 86 87 80  Resp: 17 16 20 18   Temp: 98.4 F (36.9 C) 98.9 F (37.2 C)  98.2 F (36.8 C) 98.5 F (36.9 C)  TempSrc:   Oral Oral  SpO2: 96% 98% 99% 96%  Weight:      Height:        General: Pt is alert, awake, not in acute distress Cardiovascular: RRR, S1/S2 +, no rubs, no gallops Respiratory: CTA  bilaterally, no wheezing, no rhonchi Abdominal: Soft, belly distended, mild tender to palpation, normal bowel sounds Extremities: no edema, no cyanosis    The results of significant diagnostics from this hospitalization (including imaging, microbiology, ancillary and laboratory) are listed below for reference.     Microbiology: Recent Results (from the past 240 hour(s))  Resp Panel by RT-PCR (Flu A&B, Covid) Nasopharyngeal Swab     Status: None   Collection Time: 02/01/21  5:54 PM   Specimen: Nasopharyngeal Swab; Nasopharyngeal(NP) swabs in vial transport medium  Result Value Ref Range Status   SARS Coronavirus 2 by RT PCR NEGATIVE NEGATIVE Final    Comment: (NOTE) SARS-CoV-2 target nucleic acids are NOT DETECTED.  The SARS-CoV-2 RNA is generally detectable in upper respiratory specimens during the acute phase of infection. The lowest concentration of SARS-CoV-2 viral copies this assay can detect is 138 copies/mL. A negative result does not preclude SARS-Cov-2 infection and should not be used as the sole basis for treatment or other patient management decisions. A negative result may occur with  improper specimen collection/handling, submission of specimen other than nasopharyngeal swab, presence of viral mutation(s) within the areas targeted by this assay, and inadequate number of viral copies(<138 copies/mL). A negative result must be combined with clinical observations, patient history, and epidemiological information. The expected result is Negative.  Fact Sheet for Patients:  EntrepreneurPulse.com.au  Fact Sheet for Healthcare Providers:  IncredibleEmployment.be  This test is no t yet approved or cleared by the [place] FDA and  has been authorized for detection and/or diagnosis of SARS-CoV-2 by FDA under an Emergency Use Authorization (EUA). This EUA will remain  in effect (meaning this test can be used) for the duration of  the COVID-19 declaration under Section 564(b)(1) of the Act, 21 U.S.C.section 360bbb-3(b)(1), unless the authorization is terminated  or revoked sooner.       Influenza A by PCR NEGATIVE NEGATIVE Final   Influenza B by PCR NEGATIVE NEGATIVE Final    Comment: (NOTE) The Xpert Xpress SARS-CoV-2/FLU/RSV plus assay is intended as an aid in the diagnosis of influenza from Nasopharyngeal swab specimens and should not be used as a sole basis for treatment. Nasal washings and aspirates are unacceptable for Xpert Xpress SARS-CoV-2/FLU/RSV testing.  Fact Sheet for Patients: EntrepreneurPulse.com.au  Fact Sheet for Healthcare Providers: IncredibleEmployment.be  This test is not yet approved or cleared by the [place] FDA and has been authorized for detection and/or diagnosis of SARS-CoV-2 by FDA under an Emergency Use Authorization (EUA). This EUA will remain in effect (meaning this test can be used) for the duration of the COVID-19 declaration under Section 564(b)(1) of the Act, 21 U.S.C. section 360bbb-3(b)(1), unless the authorization is terminated or revoked.  Performed at [facility], [address], [place], [place] [address]   Urine Culture     Status: Abnormal   Collection Time: 02/03/21  4:15 PM   Specimen: Urine, Clean Catch  Result Value Ref Range Status   Specimen Description   Final    URINE, CLEAN CATCH Performed at [facility], [address], [place], [place] [address]    Special Requests   Final  Normal Performed at [facility], [address] [place], [place] [address]    Culture MULTIPLE SPECIES PRESENT, SUGGEST RECOLLECTION (A)  Final   Report Status 02/05/2021 FINAL  Final     Labs: BNP (last 3 results) No results for input(s): BNP in the last 8760 hours. Basic Metabolic Panel: Recent Labs  Lab 02/07/21 0607 02/08/21 0420 02/09/21 0623 02/09/21 1409 02/10/21 0543  NA 138  141 141 138 137  K 3.1* 3.4* 3.5 3.4* 3.3*  CL 101 106 103 101 100  CO2 26 30 29  32 30  GLUCOSE 92 90 92 117* 99  BUN 34* 33* 29* 26* 22  CREATININE 1.46* 1.39* 1.51* 1.41* 1.24  CALCIUM 7.8* 7.7* 7.6* 7.3* 7.3*   Liver Function Tests: Recent Labs  Lab 02/07/21 0607 02/08/21 0420 02/09/21 0623 02/09/21 1409 02/10/21 0543  AST 18 20 36 41 55*  ALT 13 13 17 17 22   ALKPHOS 59 60 72 67 71  BILITOT 2.1* 2.4*  2.4* 4.6* 4.4*  4.5* 5.1*  PROT 5.5* 5.0* 5.2* 5.1* 4.9*  ALBUMIN 2.2* 2.0* 2.0* 1.9* 1.8*   Recent Labs  Lab 02/04/21 0445 02/08/21 0420  LIPASE 60* 19   No results for input(s): AMMONIA in the last 168 hours. CBC: Recent Labs  Lab 02/06/21 0455 02/07/21 0607 02/08/21 0420 02/09/21 0623 02/10/21 0543  WBC 12.0* 13.6* 16.7* 18.7* 15.0*  NEUTROABS 10.4* 12.0* 15.0* 16.9* 13.5*  HGB 11.0* 11.1* 10.8* 10.7* 10.2*  HCT 32.8* 32.6* 32.2* 31.8* 30.2*  MCV 92.4 91.6 90.7 92.7 91.0  PLT 134* 166 184 234 243   Cardiac Enzymes: No results for input(s): CKTOTAL, CKMB, CKMBINDEX, TROPONINI in the last 168 hours. BNP: Invalid input(s): POCBNP CBG: No results for input(s): GLUCAP in the last 168 hours. D-Dimer No results for input(s): DDIMER in the last 72 hours. Hgb A1c No results for input(s): HGBA1C in the last 72 hours. Lipid Profile No results for input(s): CHOL, HDL, LDLCALC, TRIG, CHOLHDL, LDLDIRECT in the last 72 hours. Thyroid function studies No results for input(s): TSH, T4TOTAL, T3FREE, THYROIDAB in the last 72 hours.  Invalid input(s): FREET3 Anemia work up No results for input(s): VITAMINB12, FOLATE, FERRITIN, TIBC, IRON, RETICCTPCT in the last 72 hours. Urinalysis    Component Value Date/Time   COLORURINE AMBER (A) 02/03/2021 1615   APPEARANCEUR CLOUDY (A) 02/03/2021 1615   APPEARANCEUR Cloudy (A) 01/14/2021 0848   LABSPEC 1.008 02/03/2021 1615   PHURINE 5.0 02/03/2021 1615   GLUCOSEU NEGATIVE 02/03/2021 1615   HGBUR MODERATE (A) 02/03/2021  1615   BILIRUBINUR NEGATIVE 02/03/2021 1615   BILIRUBINUR Negative 01/14/2021 0848   KETONESUR NEGATIVE 02/03/2021 1615   PROTEINUR 30 (A) 02/03/2021 1615   UROBILINOGEN 0.2 01/01/2021 1719   NITRITE NEGATIVE 02/03/2021 1615   LEUKOCYTESUR NEGATIVE 02/03/2021 1615   Sepsis Labs Invalid input(s): PROCALCITONIN,  WBC,  LACTICIDVEN Microbiology Recent Results (from the past 240 hour(s))  Resp Panel by RT-PCR (Flu A&B, Covid) Nasopharyngeal Swab     Status: None   Collection Time: 02/01/21  5:54 PM   Specimen: Nasopharyngeal Swab; Nasopharyngeal(NP) swabs in vial transport medium  Result Value Ref Range Status   SARS Coronavirus 2 by RT PCR NEGATIVE NEGATIVE Final    Comment: (NOTE) SARS-CoV-2 target nucleic acids are NOT DETECTED.  The SARS-CoV-2 RNA is generally detectable in upper respiratory specimens during the acute phase of infection. The lowest concentration of SARS-CoV-2 viral copies this assay can detect is 138 copies/mL. A negative result does not  preclude SARS-Cov-2 infection and should not be used as the sole basis for treatment or other patient management decisions. A negative result may occur with  improper specimen collection/handling, submission of specimen other than nasopharyngeal swab, presence of viral mutation(s) within the areas targeted by this assay, and inadequate number of viral copies(<138 copies/mL). A negative result must be combined with clinical observations, patient history, and epidemiological information. The expected result is Negative.  Fact Sheet for Patients:  EntrepreneurPulse.com.au  Fact Sheet for Healthcare Providers:  IncredibleEmployment.be  This test is no t yet approved or cleared by the [place] FDA and  has been authorized for detection and/or diagnosis of SARS-CoV-2 by FDA under an Emergency Use Authorization (EUA). This EUA will remain  in effect (meaning this test can be used) for the  duration of the COVID-19 declaration under Section 564(b)(1) of the Act, 21 U.S.C.section 360bbb-3(b)(1), unless the authorization is terminated  or revoked sooner.       Influenza A by PCR NEGATIVE NEGATIVE Final   Influenza B by PCR NEGATIVE NEGATIVE Final    Comment: (NOTE) The Xpert Xpress SARS-CoV-2/FLU/RSV plus assay is intended as an aid in the diagnosis of influenza from Nasopharyngeal swab specimens and should not be used as a sole basis for treatment. Nasal washings and aspirates are unacceptable for Xpert Xpress SARS-CoV-2/FLU/RSV testing.  Fact Sheet for Patients: EntrepreneurPulse.com.au  Fact Sheet for Healthcare Providers: IncredibleEmployment.be  This test is not yet approved or cleared by the [place] FDA and has been authorized for detection and/or diagnosis of SARS-CoV-2 by FDA under an Emergency Use Authorization (EUA). This EUA will remain in effect (meaning this test can be used) for the duration of the COVID-19 declaration under Section 564(b)(1) of the Act, 21 U.S.C. section 360bbb-3(b)(1), unless the authorization is terminated or revoked.  Performed at [facility], [address], [place], [place] [address]   Urine Culture     Status: Abnormal   Collection Time: 02/03/21  4:15 PM   Specimen: Urine, Clean Catch  Result Value Ref Range Status   Specimen Description   Final    URINE, CLEAN CATCH Performed at [facility], [address], [place], [place] [address]    Special Requests   Final    Normal Performed at [facility], [address] [place], [place] [address]    Culture MULTIPLE SPECIES PRESENT, SUGGEST RECOLLECTION (A)  Final   Report Status 02/05/2021 FINAL  Final     Time coordinating discharge: Over 30 minutes  SIGNED:   Sidney Ace, MD  [facility] 02/10/2021, 2:43 PM Pager   If 7PM-7AM, please contact night-coverage

## 2021-02-10 NOTE — Progress Notes (Signed)
PT Cancellation Note  Patient Details Name: James Mora MRN: [id number] DOB: 1935/12/20   Cancelled Treatment:    Reason Eval/Treat Not Completed: Other (comment).  Pt resting in bed upon PT arrival.  Pt declining physical therapy session (ex's and/or walking) d/t not feeling up to it.  Upon further discussion, pt reports it is not pain related but due to having a busy morning and was tired and wanted to rest today.  Will re-attempt PT session at a later date/time.  Leitha Bleak, PT 02/10/21, 1:47 PM

## 2021-02-10 NOTE — Progress Notes (Signed)
Initial Nutrition Assessment  DOCUMENTATION CODES:   Not applicable  INTERVENTION:   Boost Breeze po TID, each supplement provides 250 kcal and 9 grams of protein  MVI po daily  Recommend post-pyloric nasogastric tube placement and nutrition support  If nasogastric tube placed, recommend:  Vital 1.5 @55ml /hr- Initiate at 62ml/hr and increase by 72ml/hr q 8 hours until goal rate is reached.   Pro-Source 23ml daily via tube, provides 40kcal and 11g of protein per serving   Free water flushes 18ml q4 hours to maintain tube patency   Regimen provides 2020kcal/day, 100g/day protein and 1161ml/day free water   Pt at high refeed risk; recommend monitor potassium, magnesium and phosphorus labs daily until stable  NUTRITION DIAGNOSIS:   Inadequate oral intake related to acute illness as evidenced by other (comment) (pt on NPO/CL diet since admit)  GOAL:   Patient will meet greater than or equal to 90% of their needs  MONITOR:   PO intake, Supplement acceptance, Labs, Weight trends, Skin, I & O's  REASON FOR ASSESSMENT:   NPO/Clear Liquid Diet    ASSESSMENT:   85 y/o male with h/o BPH, HTN, HLD, CAD and NSTEMI s/p cardiac cath who is admitted with gallstone pancreatitis  RD working remotely.  Unable to reach pt by phone. Pt remains on NPO/clear liquid diet for the majority of his admission and is now without adequate nutrition for > 7 days. Recommend post-pyloric nasogastric tube placement and tube feeds; discussed with MD. Pt initiated on a clear liquid diet today; RD will add supplements and MVI to help pt meet his estimated needs. Pt is at high refeed risk. Pt with continued abdominal pain and nausea. GI and surgery are following. Plan is for possible transfer to [facility] for more advanced endoscopy. No new weight since admit; will request daily weights.   Medications reviewed and include: protonix, miralax, senokot, LRS @150ml /hr  Labs reviewed: K 3.3(L), alb 1.8(L), tbili  5.1(H) Wbc- 15.0(H), Hgb 10.2(L), Hct 30.2(L)  NUTRITION - FOCUSED PHYSICAL EXAM: Unable to perform at this time   Diet Order:   Diet Order             Diet clear liquid Room service appropriate? Yes; Fluid consistency: Thin  Diet effective now                  EDUCATION NEEDS:   No education needs have been identified at this time  Skin:  Skin Assessment: Reviewed RN Assessment  Last BM:  6/26  Height:   Ht Readings from Last 1 Encounters:  02/01/21 5\' 10"  (1.778 m)    Weight:   Wt Readings from Last 1 Encounters:  02/01/21 73.5 kg    Ideal Body Weight:  75.4 kg  BMI:  Body mass index is 23.25 kg/m.  Estimated Nutritional Needs:   Kcal:  1800-2100kcal/day  Protein:  95-105g/day  Fluid:  1.9-2.2L/day  Koleen Distance MS, RD, LDN Please refer to [facility] for RD and/or RD on-call/weekend/after hours pager

## 2021-02-10 NOTE — Progress Notes (Signed)
PT Cancellation Note  Patient Details Name: James Mora MRN: [id number] DOB: [birth year]   Cancelled Treatment:    Reason Eval/Treat Not Completed: Other (comment).  Pt just starting to eat upon PT arrival to pt's room.  Pt/visitor requesting that pt be able to eat now and that therapist return later.  Will re-attempt PT treatment session at a later date/time as able.  Leitha Bleak, PT 02/10/21, 11:56 AM

## 2021-02-10 NOTE — Progress Notes (Signed)
PROGRESS NOTE    James Mora  OBS:[id number] DOB: 06/02/1936 DOA: 02/01/2021 PCP: [name], PA-C   Brief Narrative:  Patient admitted 02/01/2021 with nausea vomiting abdominal pain and diarrhea.  He was diagnosed with acute pancreatitis with gallstones and enteritis seen on CT scan.  Patient was started on PPI therapy.  General surgery and gastroenterology consulted.  Lipase has trended better and general surgery will talk to the patient about removing the gallbladder.  Patient was initially scheduled for cholecystectomy on 6/29.  However in the morning he was complaining of acutely worsened abdominal pain.  Unclear etiology.  Lipase and troponin both reassuring.  Discussed with general surgery.  Cholecystectomy on hold for now pending improvement in pain control.  Work-up included repeat lipase which is trending down as well as troponin which is not indicative of acute coronary syndrome.  On 7/1 patient again rescheduled for cholecystectomy however he has increasing abdominal pain with increasing work of breathing.  Abdomen is mildly distended.  Discussed with general surgery and cholecystectomy on hold and likely will be deferred to outpatient in 4 to 6 weeks.  We will pursue CT abdomen pelvis to evaluate for possible pancreatic fluid collection versus on pancreatitis related ileus  7/2: Abdominal pain persistent.  CT abdomen pelvis with worsening peripancreatic edema consistent with persistent pancreatitis.  Surgical plan on hold.  Will not pursue surgical intervention during this admission.  Patient also endorsing some shortness of breath and exam notable for end expiratory wheeze.  Bibasilar crackles noted on exam.  7/3: Mildly increased bilirubin.  Patient remained stable however uptrending leukocytosis and persistent abdominal pain.  General surgery ordered MRCP.  7/4: Bilirubin continues to increase.  MRCP negative for CBD stone however did show some distal tapering of the  common bile duct.  Could be due to extrinsic compression from edema versus sphincter of Oddi dysfunction ampullary stone not seen on imaging.  Patient with persistent abdominal pain and distention.  Case discussed at length with general surgery and GI.  If bilirubin continues to trend up may need transfer to higher level of care for definitive management and advanced endoscopic services not available at this facility  7/5: Patient feels better, leukocytosis and kidney function are improving however bilirubin (total) increased to 5.1.  Case discussed with GI.  Still concern for small obstructive stone.  Per GI neck step would be endoscopic ultrasound not available at this facility.  GI will reach out to advanced endoscopist to [facility]  If we have the name of a endoscopist willing to consult I will discuss with the hospitalist team at [facility] and arrange transfer.   Assessment & Plan:   Active Problems:   Benign prostatic hyperplasia without lower urinary tract symptoms   Acute pancreatitis   Gallstones   Enteritis   Gallstone pancreatitis   Shortness of breath  Acute gallstone pancreatitis Hyperbilirubinemia Persistent imaging evidence of pancreatitis despite normalization of lipase Worsening leukocytosis Uptrending bilirubin Increasing abdominal pain associated with distention CT abdomen pelvis, worsening peripancreatic edema MRCP with persistent peripancreatic edema.  Negative for CBD stone.  Distal tapering of CBD with uptrending bilirubin -7/5: Uptrending bilirubin raises a concern of possible small stone.  Per GI neck step would be endoscopic ultrasound not available at this facility. Plan: Patient still with distended belly.  Clinically actually feels improved and has an appetite.  Case discussed with GI.  Recommending consideration for endoscopic ultrasound not available at this facility.  GI to discuss with advanced endoscopist  to [facility].  If we have a accepting  gastroenterologist willing to do endoscopic ultrasound I will communicate with hospitalist team at [facility] to arrange for hospital hospital transfer.  In the meantime will advance to clear liquid diet  Acute hypoxic respiratory failure Patient endorsing seeing dyspnea on exertion.  New oxygen requirement 2L.  Chest x-ray ordered on 7/2 demonstrates bibasilar atelectasis without edema or effusion.  I suspect this is secondary to splinting from pain. Plan: Continue to encourage incentive spirometry use.  Encourage ambulation and exertion is much as tolerated.  Hematuria Noted on 7/1.  Resolved spontaneously without intervention.  No stones noted on CAT scan though this was done with imaging.  May be a passed stone. Hemoglobin has been stable Plan: Monitor UOP and daily CBC If patient starts to pass clots or hematuria becomes persistent we will involve urology and consider CBI  Acute kidney injury on chronic kidney disease stage IIIa Baseline creatinine 1.64 Creatinine peaked at 2.23, subsequently downtrending Worsening kidney function on 7/4.  Suspect possible contrast-induced nephropathy Improved after IV fluid rate escalated Plan: Continue aggressive IV fluid resuscitation for now, lactated Ringer's 150 cc/h. Avoid nonessential nephrotoxins  Acute enteritis on CAT scan Suspect secondary inflammation from acute pancreatitis Continue IV PPI and pain control  History of coronary disease Hyperlipidemia Echocardiogram reassuring No further inpatient cardiac diagnostics Plan: Continue Coreg and atorvastatin Continue telemetry monitoring  Essential hypertension Well-controlled over interval Continue Coreg and enalapril  GERD PPI  BPH Flomax   DVT prophylaxis: SCDs Code Status: Full Family Communication: Wife at bedside 6/29, 6/30, 7/1, 7/2, 7/3, 7/4, 7/5 Disposition Plan: Status is: Inpatient  Remains inpatient appropriate because:Inpatient level of care appropriate  due to severity of illness  Dispo: The patient is from: Home              Anticipated d/c is to: TBD              Patient currently is not medically stable to d/c.   Difficult to place patient No  Acute gallstone pancreatitis with uptrending bilirubin.  Raises possibility of a small stone not visualized on MRCP.  Patient may require transfer to higher level of care for endoscopic ultrasound.  GI to reach out to endoscopist at [facility]    Level of care: Med-Surg  Consultants:  General surgery Cardiology  Procedures:  None  Antimicrobials:  None   Subjective: Patient seen and examined.  Pain control improved.  Does endorse some hunger.  Objective: Vitals:   02/09/21 2048 02/10/21 0138 02/10/21 0538 02/10/21 0732  BP: (!) 146/77 133/78 (!) 144/81 (!) 159/74  Pulse: 87 86 86 87  Resp: 18 17 16 20   Temp: 99.5 F (37.5 C) 98.4 F (36.9 C) 98.9 F (37.2 C) 98.2 F (36.8 C)  TempSrc:    Oral  SpO2: 96% 96% 98% 99%  Weight:      Height:        Intake/Output Summary (Last 24 hours) at 02/10/2021 0940 Last data filed at 02/10/2021 0734 Gross per 24 hour  Intake 8329.37 ml  Output 1205 ml  Net 7124.37 ml   Filed Weights   02/01/21 1644 02/01/21 2319  Weight: 74.8 kg 73.5 kg    Examination:  General exam: Mild distress due to pain.  Appears fatigued Respiratory system: Bibasilar crackles.  Normal work of breathing.  2 L Cardiovascular system: S1-S2, regular rate and rhythm, no murmurs gastrointestinal system: Soft, distended, mild tender to palpation Central nervous  system: Alert and oriented. No focal neurological deficits. Extremities: Symmetric 5 x 5 power. Skin: No rashes, lesions or ulcers Psychiatry: Judgement and insight appear normal. Mood & affect appropriate.     Data Reviewed: I have personally reviewed following labs and imaging studies  CBC: Recent Labs  Lab 02/06/21 0455 02/07/21 0607 02/08/21 0420 02/09/21 0623 02/10/21 0543  WBC 12.0*  13.6* 16.7* 18.7* 15.0*  NEUTROABS 10.4* 12.0* 15.0* 16.9* 13.5*  HGB 11.0* 11.1* 10.8* 10.7* 10.2*  HCT 32.8* 32.6* 32.2* 31.8* 30.2*  MCV 92.4 91.6 90.7 92.7 91.0  PLT 134* 166 184 234 007   Basic Metabolic Panel: Recent Labs  Lab 02/07/21 0607 02/08/21 0420 02/09/21 0623 02/09/21 1409 02/10/21 0543  NA 138 141 141 138 137  K 3.1* 3.4* 3.5 3.4* 3.3*  CL 101 106 103 101 100  CO2 26 30 29  32 30  GLUCOSE 92 90 92 117* 99  BUN 34* 33* 29* 26* 22  CREATININE 1.46* 1.39* 1.51* 1.41* 1.24  CALCIUM 7.8* 7.7* 7.6* 7.3* 7.3*   GFR: Estimated Creatinine Clearance: 47.4 mL/min (by C-G formula based on SCr of 1.24 mg/dL). Liver Function Tests: Recent Labs  Lab 02/07/21 0607 02/08/21 0420 02/09/21 0623 02/09/21 1409 02/10/21 0543  AST 18 20 36 41 55*  ALT 13 13 17 17 22   ALKPHOS 59 60 72 67 71  BILITOT 2.1* 2.4*  2.4* 4.6* 4.4*  4.5* 5.1*  PROT 5.5* 5.0* 5.2* 5.1* 4.9*  ALBUMIN 2.2* 2.0* 2.0* 1.9* 1.8*   Recent Labs  Lab 02/04/21 0445 02/08/21 0420  LIPASE 60* 19   No results for input(s): AMMONIA in the last 168 hours. Coagulation Profile: No results for input(s): INR, PROTIME in the last 168 hours. Cardiac Enzymes: No results for input(s): CKTOTAL, CKMB, CKMBINDEX, TROPONINI in the last 168 hours. BNP (last 3 results) No results for input(s): PROBNP in the last 8760 hours. HbA1C: No results for input(s): HGBA1C in the last 72 hours. CBG: No results for input(s): GLUCAP in the last 168 hours. Lipid Profile: No results for input(s): CHOL, HDL, LDLCALC, TRIG, CHOLHDL, LDLDIRECT in the last 72 hours.  Thyroid Function Tests: No results for input(s): TSH, T4TOTAL, FREET4, T3FREE, THYROIDAB in the last 72 hours. Anemia Panel: No results for input(s): VITAMINB12, FOLATE, FERRITIN, TIBC, IRON, RETICCTPCT in the last 72 hours. Sepsis Labs: No results for input(s): PROCALCITON, LATICACIDVEN in the last 168 hours.  Recent Results (from the past 240 hour(s))  Resp  Panel by RT-PCR (Flu A&B, Covid) Nasopharyngeal Swab     Status: None   Collection Time: 02/01/21  5:54 PM   Specimen: Nasopharyngeal Swab; Nasopharyngeal(NP) swabs in vial transport medium  Result Value Ref Range Status   SARS Coronavirus 2 by RT PCR NEGATIVE NEGATIVE Final    Comment: (NOTE) SARS-CoV-2 target nucleic acids are NOT DETECTED.  The SARS-CoV-2 RNA is generally detectable in upper respiratory specimens during the acute phase of infection. The lowest concentration of SARS-CoV-2 viral copies this assay can detect is 138 copies/mL. A negative result does not preclude SARS-Cov-2 infection and should not be used as the sole basis for treatment or other patient management decisions. A negative result may occur with  improper specimen collection/handling, submission of specimen other than nasopharyngeal swab, presence of viral mutation(s) within the areas targeted by this assay, and inadequate number of viral copies(<138 copies/mL). A negative result must be combined with clinical observations, patient history, and epidemiological information. The expected result is Negative.  Fact Sheet for  Patients:  EntrepreneurPulse.com.au  Fact Sheet for Healthcare Providers:  IncredibleEmployment.be  This test is no t yet approved or cleared by the [place] FDA and  has been authorized for detection and/or diagnosis of SARS-CoV-2 by FDA under an Emergency Use Authorization (EUA). This EUA will remain  in effect (meaning this test can be used) for the duration of the COVID-19 declaration under Section 564(b)(1) of the Act, 21 U.S.C.section 360bbb-3(b)(1), unless the authorization is terminated  or revoked sooner.       Influenza A by PCR NEGATIVE NEGATIVE Final   Influenza B by PCR NEGATIVE NEGATIVE Final    Comment: (NOTE) The Xpert Xpress SARS-CoV-2/FLU/RSV plus assay is intended as an aid in the diagnosis of influenza from Nasopharyngeal  swab specimens and should not be used as a sole basis for treatment. Nasal washings and aspirates are unacceptable for Xpert Xpress SARS-CoV-2/FLU/RSV testing.  Fact Sheet for Patients: EntrepreneurPulse.com.au  Fact Sheet for Healthcare Providers: IncredibleEmployment.be  This test is not yet approved or cleared by the [place] FDA and has been authorized for detection and/or diagnosis of SARS-CoV-2 by FDA under an Emergency Use Authorization (EUA). This EUA will remain in effect (meaning this test can be used) for the duration of the COVID-19 declaration under Section 564(b)(1) of the Act, 21 U.S.C. section 360bbb-3(b)(1), unless the authorization is terminated or revoked.  Performed at [facility], [address] [place], [place] [address]   Urine Culture     Status: Abnormal   Collection Time: 02/03/21  4:15 PM   Specimen: Urine, Clean Catch  Result Value Ref Range Status   Specimen Description   Final    URINE, CLEAN CATCH Performed at [facility], [address] [place], [place] [address]    Special Requests   Final    Normal Performed at [facility], [address] [place], [place] [address]    Culture MULTIPLE SPECIES PRESENT, SUGGEST RECOLLECTION (A)  Final   Report Status 02/05/2021 FINAL  Final         Radiology Studies: MR 3D Recon At Scanner  Result Date: 02/08/2021 CLINICAL DATA:  Jaundice. Abnormal LFTs. Pancreatitis on previous recent CT EXAM: MRI ABDOMEN WITHOUT AND WITH CONTRAST (INCLUDING MRCP) TECHNIQUE: Multiplanar multisequence MR imaging of the abdomen was performed both before and after the administration of intravenous contrast. Heavily T2-weighted images of the biliary and pancreatic ducts were obtained, and three-dimensional MRCP images were rendered by post processing. CONTRAST:  7.38mL GADAVIST GADOBUTROL 1 MMOL/ML IV SOLN COMPARISON:  CT scan 02/06/2021 FINDINGS: Lower  chest: Small bilateral effusions with dependent atelectasis in the lower lobes. Heart size upper normal to mildly increased. Hepatobiliary: 2.0 cm T2 hyperintense lesion in the central right liver is hypointense on T1 imaging and flash fills after IV contrast administration. Tiny nonenhancing T2 focus in the anterior left liver is too small to characterize but likely a cyst. Tiny gallstone measures 3-4 mm (axial T2 haste image 16/series 3. Mild fullness of the intrahepatic bile ducts evident common duct measures 6 mm diameter upper normal. There is gradual tapering of the common bile duct through the head of the pancreas into the ampulla. No evidence for choledocholithiasis. Pancreas: Motion degraded assessment shows an area of decreased perfusion in the head of pancreas with diffuse ill definition of pancreatic parenchyma. Peripancreatic fluid is similar to recent CT. Spleen:  No splenomegaly. No focal mass lesion. Adrenals/Urinary Tract: No adrenal nodule or mass. Multiple bilateral renal cysts of varying size  and signal intensity noted. Stomach/Bowel: Stomach is unremarkable. No gastric wall thickening. No evidence of outlet obstruction. Duodenum is normally positioned as is the ligament of Treitz. No small bowel or colonic dilatation within the visualized abdomen. Vascular/Lymphatic: No abdominal aortic aneurysm. Portal vein is patent. There is marked mass-effect on the portal splenic confluence and while attenuated, this does appear to remain patent. Splenic vein is attenuated in the region of the pancreatic body but remains patent. Celiac axis is patent although the trifurcation is not well demonstrated. There is no gastrohepatic or hepatoduodenal ligament lymphadenopathy. No retroperitoneal or mesenteric lymphadenopathy. Other: Free fluid is seen around the liver and spleen with retroperitoneal fluid and edema tracking in the transverse mesocolon and root of the small bowel mesentery. Musculoskeletal: No  focal suspicious marrow enhancement within the visualized bony anatomy. IMPRESSION: 1. Motion degraded assessment shows ill definition of the pancreatic parenchyma in the head of pancreas with peripancreatic fluid and edema tracking in the transverse mesocolon and root of the small bowel mesentery. Imaging features are consistent with acute pancreatitis. No rim enhancing fluid collection at this time. Small focus of potential decreased enhancement noted in the anterior pancreatic head raising the question of a small focus of pancreatic necrosis. 2. Mild fullness of the intrahepatic bile ducts with gradual tapering of the common bile duct through the head of the pancreas into the ampulla. No evidence for choledocholithiasis. No obstructing mass lesion evident. 3. New fluid seen between the lateral segment left liver and stomach with similar appearance of confluent fluid anterior to the head of the pancreas. 4. Cholelithiasis. 5. 2.0 cm T2 hyperintense lesion in the central right liver flash fills after IV contrast administration. This is likely benign and may be a hemangioma. Follow-up MRI in 3-6 months could be used to ensure stability. 6. Small bilateral effusions with dependent atelectasis in the lower lobes. 7. Multiple bilateral renal cysts of varying size and signal intensity. Electronically Signed   By: Misty Stanley M.D.   On: 02/08/2021 15:06   MR ABDOMEN MRCP W WO CONTAST  Result Date: 02/08/2021 CLINICAL DATA:  Jaundice. Abnormal LFTs. Pancreatitis on previous recent CT EXAM: MRI ABDOMEN WITHOUT AND WITH CONTRAST (INCLUDING MRCP) TECHNIQUE: Multiplanar multisequence MR imaging of the abdomen was performed both before and after the administration of intravenous contrast. Heavily T2-weighted images of the biliary and pancreatic ducts were obtained, and three-dimensional MRCP images were rendered by post processing. CONTRAST:  7.52mL GADAVIST GADOBUTROL 1 MMOL/ML IV SOLN COMPARISON:  CT scan 02/06/2021  FINDINGS: Lower chest: Small bilateral effusions with dependent atelectasis in the lower lobes. Heart size upper normal to mildly increased. Hepatobiliary: 2.0 cm T2 hyperintense lesion in the central right liver is hypointense on T1 imaging and flash fills after IV contrast administration. Tiny nonenhancing T2 focus in the anterior left liver is too small to characterize but likely a cyst. Tiny gallstone measures 3-4 mm (axial T2 haste image 16/series 3. Mild fullness of the intrahepatic bile ducts evident common duct measures 6 mm diameter upper normal. There is gradual tapering of the common bile duct through the head of the pancreas into the ampulla. No evidence for choledocholithiasis. Pancreas: Motion degraded assessment shows an area of decreased perfusion in the head of pancreas with diffuse ill definition of pancreatic parenchyma. Peripancreatic fluid is similar to recent CT. Spleen:  No splenomegaly. No focal mass lesion. Adrenals/Urinary Tract: No adrenal nodule or mass. Multiple bilateral renal cysts of varying size and signal intensity noted. Stomach/Bowel: Stomach is  unremarkable. No gastric wall thickening. No evidence of outlet obstruction. Duodenum is normally positioned as is the ligament of Treitz. No small bowel or colonic dilatation within the visualized abdomen. Vascular/Lymphatic: No abdominal aortic aneurysm. Portal vein is patent. There is marked mass-effect on the portal splenic confluence and while attenuated, this does appear to remain patent. Splenic vein is attenuated in the region of the pancreatic body but remains patent. Celiac axis is patent although the trifurcation is not well demonstrated. There is no gastrohepatic or hepatoduodenal ligament lymphadenopathy. No retroperitoneal or mesenteric lymphadenopathy. Other: Free fluid is seen around the liver and spleen with retroperitoneal fluid and edema tracking in the transverse mesocolon and root of the small bowel mesentery.  Musculoskeletal: No focal suspicious marrow enhancement within the visualized bony anatomy. IMPRESSION: 1. Motion degraded assessment shows ill definition of the pancreatic parenchyma in the head of pancreas with peripancreatic fluid and edema tracking in the transverse mesocolon and root of the small bowel mesentery. Imaging features are consistent with acute pancreatitis. No rim enhancing fluid collection at this time. Small focus of potential decreased enhancement noted in the anterior pancreatic head raising the question of a small focus of pancreatic necrosis. 2. Mild fullness of the intrahepatic bile ducts with gradual tapering of the common bile duct through the head of the pancreas into the ampulla. No evidence for choledocholithiasis. No obstructing mass lesion evident. 3. New fluid seen between the lateral segment left liver and stomach with similar appearance of confluent fluid anterior to the head of the pancreas. 4. Cholelithiasis. 5. 2.0 cm T2 hyperintense lesion in the central right liver flash fills after IV contrast administration. This is likely benign and may be a hemangioma. Follow-up MRI in 3-6 months could be used to ensure stability. 6. Small bilateral effusions with dependent atelectasis in the lower lobes. 7. Multiple bilateral renal cysts of varying size and signal intensity. Electronically Signed   By: Misty Stanley M.D.   On: 02/08/2021 15:06        Scheduled Meds:  atorvastatin  20 mg Oral q1800   carvedilol  3.125 mg Oral BID WC   pantoprazole  40 mg Oral BID   polyethylene glycol  17 g Oral Daily   senna-docusate  1 tablet Oral BID   tamsulosin  0.4 mg Oral QPC breakfast   Continuous Infusions:  chlorproMAZINE (THORAZINE) IV Stopped (02/06/21 0943)   lactated ringers 150 mL/hr at 02/10/21 0413     LOS: 9 days    Time spent: 25 minutes    Sidney Ace, MD [facility] Pager [phone]  If 7PM-7AM, please contact night-coverage 02/10/2021, 9:40  AM

## 2021-02-10 NOTE — Progress Notes (Signed)
GI Inpatient Follow-up Note  Subjective:  Patient seen and still with persistent RUQ pain. Kidney function better  Scheduled Inpatient Medications:   atorvastatin  20 mg Oral q1800   carvedilol  3.125 mg Oral BID WC   pantoprazole  40 mg Oral BID   polyethylene glycol  17 g Oral Daily   senna-docusate  1 tablet Oral BID   tamsulosin  0.4 mg Oral QPC breakfast    Continuous Inpatient Infusions:    chlorproMAZINE (THORAZINE) IV Stopped (02/06/21 0943)   lactated ringers 150 mL/hr at 02/10/21 0413    PRN Inpatient Medications:  acetaminophen **OR** acetaminophen, chlorproMAZINE (THORAZINE) IV, HYDROmorphone (DILAUDID) injection, magnesium hydroxide, nitroGLYCERIN, ondansetron **OR** ondansetron (ZOFRAN) IV, oxyCODONE, traZODone  Review of Systems:  Review of Systems  Constitutional:  Negative for chills and fever.  Respiratory:  Negative for cough.   Cardiovascular:  Negative for chest pain.  Gastrointestinal:  Positive for abdominal pain. Negative for diarrhea.  Musculoskeletal:  Positive for joint pain.  Skin:  Negative for rash.  Neurological:  Negative for focal weakness.  All other systems reviewed and are negative.    Physical Examination: BP (!) 159/74 (BP Location: Right Arm)   Pulse 87   Temp 98.2 F (36.8 C) (Oral)   Resp 20   Ht 5' 10" (1.778 m)   Wt 73.5 kg   SpO2 99%   BMI 23.25 kg/m  Gen: NAD, alert and oriented x 4 HEENT: PEERLA, EOMI, Neck: supple Chest: No respiratory distress CV: RRR Abd: soft, distended Ext: no edema, well perfused with 2+ pulses, Skin: no rash or lesions noted Lymph: no lymphadenopathy  Data: Lab Results  Component Value Date   WBC 15.0 (H) 02/10/2021   HGB 10.2 (L) 02/10/2021   HCT 30.2 (L) 02/10/2021   MCV 91.0 02/10/2021   PLT 243 02/10/2021   Recent Labs  Lab 02/08/21 0420 02/09/21 0623 02/10/21 0543  HGB 10.8* 10.7* 10.2*   Lab Results  Component Value Date   NA 137 02/10/2021   K 3.3 (L) 02/10/2021    CL 100 02/10/2021   CO2 30 02/10/2021   BUN 22 02/10/2021   CREATININE 1.24 02/10/2021   Lab Results  Component Value Date   ALT 22 02/10/2021   AST 55 (H) 02/10/2021   ALKPHOS 71 02/10/2021   BILITOT 5.1 (H) 02/10/2021   No results for input(s): APTT, INR, PTT in the last 168 hours. Assessment/Plan: Mr. Whitner is a 85 y.o. gentleman with pancreatitis and uptrending T. Bili up to 5.1. Had negative MRCP 2 days ago but still with persistently rising bilirubin but normal alk. Phos. Confusing picture has it has been over a week since initially diagnosed with pancreatitis and with adequate resuscitation but d. Bili has rose with persistent RUQ pain. MRCP can miss small stones. Discussed with both endoscopist who performs ERCP's here and surgeon and both would like to avoid invasive intervention unless more evidence of a true stone is shown. This makes a potential EUS reasonable. Will attempt to reach out to GI teams at outside facilities to see if this would be possible.  Recommendations:  - continue clear liquids - awaiting word on potential transfer for EUS - will continue to follow along, please call with any questions or concerns.  Raylene Miyamoto MD, MPH [place]

## 2021-02-12 ENCOUNTER — Telehealth: Payer: Self-pay | Admitting: Family Medicine

## 2021-02-12 NOTE — Telephone Encounter (Signed)
Pt wife is calling  to let dennis know that the pt James Mora. Pt was at [facility] for 9 days with pac Pancreatitis. Please advise [phone]- [phone]

## 2021-02-13 ENCOUNTER — Encounter: Payer: Self-pay | Admitting: Family Medicine

## 2021-02-13 NOTE — Telephone Encounter (Signed)
FYI. Thanks.

## 2021-02-16 ENCOUNTER — Other Ambulatory Visit: Payer: Self-pay | Admitting: Urology

## 2021-02-20 ENCOUNTER — Encounter: Payer: Self-pay | Admitting: Urology

## 2021-02-20 DIAGNOSIS — J9601 Acute respiratory failure with hypoxia: Secondary | ICD-10-CM | POA: Insufficient documentation

## 2021-02-20 DIAGNOSIS — N182 Chronic kidney disease, stage 2 (mild): Secondary | ICD-10-CM | POA: Insufficient documentation

## 2021-02-20 DIAGNOSIS — E872 Acidosis, unspecified: Secondary | ICD-10-CM | POA: Insufficient documentation

## 2021-02-23 ENCOUNTER — Other Ambulatory Visit: Payer: Self-pay | Admitting: Family Medicine

## 2021-02-23 DIAGNOSIS — E7800 Pure hypercholesterolemia, unspecified: Secondary | ICD-10-CM

## 2021-02-23 NOTE — Telephone Encounter (Signed)
Notes to clinic:  script has expired  Review for continued use and refill   Requested Prescriptions  Pending Prescriptions Disp Refills   atorvastatin (LIPITOR) 20 MG tablet [Pharmacy Med Name: ATORVASTATIN 20MG  TABLETS] 90 tablet 3    Sig: TAKE 1 TABLET(20 MG) BY MOUTH DAILY AT 6 PM      Cardiovascular:  Antilipid - Statins Passed - 02/23/2021 10:25 AM      Passed - Total Cholesterol in normal range and within 360 days    Cholesterol, Total  Date Value Ref Range Status  01/02/2021 125 100 - 199 mg/dL Final   Cholesterol  Date Value Ref Range Status  02/01/2021 125 0 - 200 mg/dL Final          Passed - LDL in normal range and within 360 days    LDL Cholesterol (Calc)  Date Value Ref Range Status  06/20/2017 58 mg/dL (calc) Final    Comment:    Reference range: <100 . Desirable range <100 mg/dL for primary prevention;   <70 mg/dL for patients with CHD or diabetic patients  with > or = 2 CHD risk factors. Marland Kitchen LDL-C is now calculated using the Martin-Hopkins  calculation, which is a validated novel method providing  better accuracy than the Friedewald equation in the  estimation of LDL-C.  Cresenciano Genre et al. Annamaria Helling. 6767;209(47): 2061-2068  (http://education.[facility].com/faq/FAQ164)    LDL Chol Calc (NIH)  Date Value Ref Range Status  01/02/2021 64 0 - 99 mg/dL Final   LDL Cholesterol  Date Value Ref Range Status  02/01/2021 62 0 - 99 mg/dL Final    Comment:           Total Cholesterol/HDL:CHD Risk Coronary Heart Disease Risk Table                     Men   Women  1/2 Average Risk   3.4   3.3  Average Risk       5.0   4.4  2 X Average Risk   9.6   7.1  3 X Average Risk  23.4   11.0        Use the calculated Patient Ratio above and the CHD Risk Table to determine the patient's CHD Risk.        ATP III CLASSIFICATION (LDL):  <100     mg/dL   Optimal  100-129  mg/dL   Near or Above                    Optimal  130-159  mg/dL   Borderline  160-189  mg/dL    High  >190     mg/dL   Very High Performed at [facility], [address] [place], [place] [address]           Passed - HDL in normal range and within 360 days    HDL  Date Value Ref Range Status  02/01/2021 49 >40 mg/dL Final  01/02/2021 45 >39 mg/dL Final          Passed - Triglycerides in normal range and within 360 days    Triglycerides  Date Value Ref Range Status  02/01/2021 68 <150 mg/dL Final          Passed - Patient is not pregnant      Passed - Valid encounter within last 12 months    Recent Outpatient Visits           1 month ago  Hematuria, unspecified type   [facility], Vickki Muff, PA-C   1 year ago Medicare annual wellness visit, subsequent   [employer], Vickki Muff, [place]   3 years ago Pruritus   [employer], Vickki Muff, PA-C   3 years ago Bilateral impacted cerumen   [employer], Vickki Muff, PA-C   3 years ago [employer] annual wellness visit, subsequent   [employer], Vickki Muff, PA-C       Future Appointments             In 3 days Chrismon, Vickki Muff, PA-C [employer], [place]

## 2021-02-24 ENCOUNTER — Other Ambulatory Visit: Payer: Self-pay | Admitting: Family Medicine

## 2021-02-24 DIAGNOSIS — E7800 Pure hypercholesterolemia, unspecified: Secondary | ICD-10-CM

## 2021-02-26 ENCOUNTER — Ambulatory Visit: Payer: Medicare Other | Admitting: Family Medicine

## 2021-02-27 ENCOUNTER — Emergency Department: Payer: Medicare Other

## 2021-02-27 ENCOUNTER — Other Ambulatory Visit: Payer: Self-pay

## 2021-02-27 ENCOUNTER — Emergency Department
Admission: EM | Admit: 2021-02-27 | Discharge: 2021-02-27 | Disposition: A | Discharge status: Home / Self Care | Payer: Medicare Other | Attending: Emergency Medicine | Admitting: Emergency Medicine

## 2021-02-27 DIAGNOSIS — R509 Fever, unspecified: Secondary | ICD-10-CM | POA: Insufficient documentation

## 2021-02-27 DIAGNOSIS — R059 Cough, unspecified: Secondary | ICD-10-CM | POA: Diagnosis not present

## 2021-02-27 DIAGNOSIS — R000 Tachycardia, unspecified: Secondary | ICD-10-CM | POA: Diagnosis not present

## 2021-02-27 DIAGNOSIS — Z79899 Other long term (current) drug therapy: Secondary | ICD-10-CM | POA: Diagnosis not present

## 2021-02-27 DIAGNOSIS — R531 Weakness: Secondary | ICD-10-CM | POA: Insufficient documentation

## 2021-02-27 DIAGNOSIS — F419 Anxiety disorder, unspecified: Secondary | ICD-10-CM | POA: Insufficient documentation

## 2021-02-27 DIAGNOSIS — R2240 Localized swelling, mass and lump, unspecified lower limb: Secondary | ICD-10-CM | POA: Diagnosis not present

## 2021-02-27 DIAGNOSIS — I119 Hypertensive heart disease without heart failure: Secondary | ICD-10-CM | POA: Insufficient documentation

## 2021-02-27 DIAGNOSIS — Z7982 Long term (current) use of aspirin: Secondary | ICD-10-CM | POA: Diagnosis not present

## 2021-02-27 DIAGNOSIS — R0602 Shortness of breath: Secondary | ICD-10-CM | POA: Insufficient documentation

## 2021-02-27 DIAGNOSIS — I2510 Atherosclerotic heart disease of native coronary artery without angina pectoris: Secondary | ICD-10-CM | POA: Insufficient documentation

## 2021-02-27 DIAGNOSIS — Z20822 Contact with and (suspected) exposure to covid-19: Secondary | ICD-10-CM | POA: Insufficient documentation

## 2021-02-27 DIAGNOSIS — R0682 Tachypnea, not elsewhere classified: Secondary | ICD-10-CM | POA: Insufficient documentation

## 2021-02-27 LAB — CBC WITH DIFFERENTIAL/PLATELET
Abs Immature Granulocytes: 0.03 10*3/uL (ref 0.00–0.07)
Basophils Absolute: 0 10*3/uL (ref 0.0–0.1)
Basophils Relative: 1 %
Eosinophils Absolute: 0 10*3/uL (ref 0.0–0.5)
Eosinophils Relative: 1 %
HCT: 29.5 % — ABNORMAL LOW (ref 39.0–52.0)
Hemoglobin: 9.4 g/dL — ABNORMAL LOW (ref 13.0–17.0)
Immature Granulocytes: 1 %
Lymphocytes Relative: 23 %
Lymphs Abs: 1.5 10*3/uL (ref 0.7–4.0)
MCH: 30 pg (ref 26.0–34.0)
MCHC: 31.9 g/dL (ref 30.0–36.0)
MCV: 94.2 fL (ref 80.0–100.0)
Monocytes Absolute: 0.6 10*3/uL (ref 0.1–1.0)
Monocytes Relative: 9 %
Neutro Abs: 4.4 10*3/uL (ref 1.7–7.7)
Neutrophils Relative %: 65 %
Platelets: 243 10*3/uL (ref 150–400)
RBC: 3.13 MIL/uL — ABNORMAL LOW (ref 4.22–5.81)
RDW: 15 % (ref 11.5–15.5)
WBC: 6.5 10*3/uL (ref 4.0–10.5)
nRBC: 0 % (ref 0.0–0.2)

## 2021-02-27 LAB — RESP PANEL BY RT-PCR (FLU A&B, COVID) ARPGX2
Influenza A by PCR: NEGATIVE
Influenza B by PCR: NEGATIVE
SARS Coronavirus 2 by RT PCR: NEGATIVE

## 2021-02-27 LAB — BASIC METABOLIC PANEL
Anion gap: 5 (ref 5–15)
BUN: 22 mg/dL (ref 8–23)
CO2: 28 mmol/L (ref 22–32)
Calcium: 8.5 mg/dL — ABNORMAL LOW (ref 8.9–10.3)
Chloride: 104 mmol/L (ref 98–111)
Creatinine, Ser: 1.17 mg/dL (ref 0.61–1.24)
GFR, Estimated: >60 mL/min (ref >60)
Glucose, Bld: 109 mg/dL — ABNORMAL HIGH (ref 70–99)
Potassium: 4 mmol/L (ref 3.5–5.1)
Sodium: 137 mmol/L (ref 135–145)

## 2021-02-27 LAB — HEPATIC FUNCTION PANEL
ALT: 25 U/L (ref 0–44)
AST: 30 U/L (ref 15–41)
Albumin: 2.4 g/dL — ABNORMAL LOW (ref 3.5–5.0)
Alkaline Phosphatase: 74 U/L (ref 38–126)
Bilirubin, Direct: 0.3 mg/dL — ABNORMAL HIGH (ref 0.0–0.2)
Indirect Bilirubin: 0.8 mg/dL (ref 0.3–0.9)
Total Bilirubin: 1.1 mg/dL (ref 0.3–1.2)
Total Protein: 6.2 g/dL — ABNORMAL LOW (ref 6.5–8.1)

## 2021-02-27 LAB — TROPONIN I (HIGH SENSITIVITY)
Troponin I (High Sensitivity): 15 ng/L (ref <18)
Troponin I (High Sensitivity): 13 ng/L (ref <18)

## 2021-02-27 LAB — LIPASE, BLOOD: Lipase: 44 U/L (ref 11–51)

## 2021-02-27 LAB — BRAIN NATRIURETIC PEPTIDE: B Natriuretic Peptide: 149.7 pg/mL — ABNORMAL HIGH (ref 0.0–100.0)

## 2021-02-27 LAB — PROCALCITONIN: Procalcitonin: <0.1 ng/mL

## 2021-02-27 LAB — D-DIMER, QUANTITATIVE: D-Dimer, Quant: 4.66 ug/mL-FEU — ABNORMAL HIGH (ref 0.00–0.50)

## 2021-02-27 MED ORDER — IOHEXOL 350 MG/ML SOLN
75.0000 mL | Freq: Once | INTRAVENOUS | Status: AC | PRN
  Administered 2021-02-27: 75 mL via INTRAVENOUS

## 2021-02-27 NOTE — ED Notes (Signed)
Report given to receiving facility see care handoff. Transport has been called.

## 2021-02-27 NOTE — ED Notes (Signed)
Called [facility] for transport to [place]

## 2021-02-27 NOTE — Discharge Instructions (Addendum)
Your labs, CT, EKG today were reassuring and your vitals were normal.  No sign of pneumonia, pulmonary edema, blood clot or heart attack.  Please follow up with your PCP.

## 2021-02-27 NOTE — ED Notes (Signed)
Pt declined ambulation trial - stating he did not feel comfortable. Pt was offered a walker and informed that this RN would walk with him for support and patient still declined. MD aware.

## 2021-02-27 NOTE — ED Provider Notes (Addendum)
[facility] Emergency Department Provider Note  ____________________________________________   Event Date/Time   First MD Initiated Contact with Patient 02/27/21 [phone]     (approximate)  I have reviewed the triage vital signs and the nursing notes.   HISTORY  Chief Complaint Weakness and Anxiety    HPI James Mora is a 85 y.o. male with history of CAD, hypertension, hyperlipidemia, aortic insufficiency who presents to the emergency department with complaints of shortness of breath that started tonight.  Patient arrives to the ED with EMS from [facility] facility.  He states that symptoms are improving but still present.  No chest pain or chest discomfort.  No fever.  States he has occasional nonproductive cough.  Has had some lower extremity swelling.  He is not sure if he has a history of CHF.  Denies history of asthma or COPD.  Reports he has been vaccinated against COVID-19 and influenza.  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  Was recently sent to facility 2 days ago after admission for pancreatitis.  Patient c/o SOB on rounds tonight.  Does not wear O2 normally.  He was tachypneic with increased WOB.  Patient had brief LOC, staring into space, not responsive for a few seconds.  Then over time became more responsive.     On review of patient's records, appears who admitted to [facility] on 02/01/2021 for gallstone pancreatitis, enteritis.  Patient transferred to [facility] for concerns for rising bilirubin on 02/10/2021 and need for endoscopic ultrasound.  He was discharged from [facility] on 02/25/2021.  Per [facility] note:  "Briefly pt initially presented to [facility] on 6/26 with N/V, abd pain radiating to the back, and diarrhea. He was subsequently diagnosed with acute pancreatitis by CT and elevated lipase (1666 at OSH presentation). CT scan also noted enteritis involving proximal/mid-duodenum and subsequent [place] noted a  single 1cm gallstone within the gallbladder. Unfortunately on 7/1, patient had worsening abdominal pain and was found to have worsening pancreatitis on CT imaging, as well as worsening leukocytosis and bilirubin. MRCP was performed and was negative for choledocholithiasis but with possible stone obstructing sphincter of Oddi vs peripancreatic edema.   Thus patient was transferred to [facility] for possible ERCP on 7/5. General surgery was consulted for possible cholecystectomy and GI was consulted for ERCP/EUS. On medicine floor, patient had an EUS on 7/8 demonstrated findings consistent with acute pancreatitis and no evidence of stones and sludge in bile duct. Gallbladder sludge noted. Patient's symptoms improved and began to tolerate a diet. Cholecystectomy was deferred given patient's ongoing dyspnea. Unfortunately patient developed dyspnea becoming 7/10 which was attributed to pulmonary edema. He also developed new oxygen requirement and was placed on [place] 1-3 L beginning on 02/17/21. He was diursed with IV lasix from 7/9-7/13. Patient also reportedly with increasing agitation on 7/12 with possible ?rigors vs "shaking movements." Initial RRT was called on 7/14 for respiratory distressed. He was noted tachycardic, tachypneic, and hypertensive. Was given Lasix 40 mg with reportedly improved vitals. Second RRT was called and now patient was noted to be febrile to 38.4 as well as increasingly tachypneic.   Transferred to NICU for higher level of care, further work-up, and monitoring.   Hospital course:  7/15: RRT to NICU. CT abd showing evolving acute necrotizing pancreatitis with peripancreatic fluic collections. Encephalopathic this AM with resp acidosis. Placed on BIPAP. Now trialed off with repeat ABG. Gen surg and GI re-consulted. GI following. Gen surg said no surgical  intervention at this time. On zosyn and vanc. 7/16 no issues 7/18 overnight no issues.  7/19 bp elevated overnigth, hydralazine overnght , also  with pause 2.4 sec  7/20 another sinus pause about 2 sec while sleeping. No drop in bp Patient denies abdominal pain or nausea"      Echo 02/03/21:  IMPRESSIONS     1. Left ventricular ejection fraction, by estimation, is 70 to 75%. The  left ventricle has hyperdynamic function. The left ventricle has no  regional wall motion abnormalities. Left ventricular diastolic parameters  were normal.   2. Right ventricular systolic function is normal. The right ventricular  size is mildly enlarged. Tricuspid regurgitation signal is inadequate for  assessing PA pressure.   3. The mitral valve is grossly normal. No evidence of mitral valve  regurgitation. No evidence of mitral stenosis.   4. The aortic valve is tricuspid. Aortic valve regurgitation is mild.  Mild aortic valve sclerosis is present, with no evidence of aortic valve  stenosis.   5. Aortic dilatation noted. There is mild dilatation of the aortic root,  measuring 41 mm.   Past Medical History:  Diagnosis Date   Acid reflux    Aortic insufficiency    a. 03/2016 Echo: EF 55-60%, no rwma, Gr1 DD, mild to mod AI, mild TR.   CAD (coronary artery disease)    a. 2000 Cath: Severe distal LAD disease with left to left collaterals;  b. 2008 MV: non-ischemic; c. 03/2016 Cath: LM nl, LAD 30p, D1/D2 mod, nl, LCX nl, OM1/2 nl w/ L-->L collats to apical LAD, RCA dominant, nl. EF 55-65%.   Hypertensive heart disease 2005   unspecified   Mixed hyperlipidemia    mixed    Patient Active Problem List   Diagnosis Date Noted   Gallstone pancreatitis    Shortness of breath    Gallstones    Enteritis    Acute pancreatitis 02/01/2021   Narrowing of intervertebral disc space 06/18/2016   Elevated prostate specific antigen (PSA) 06/18/2016   Benign prostatic hyperplasia without lower urinary tract symptoms 06/18/2016   HLD (hyperlipidemia)    Mixed hyperlipidemia    CAD (coronary artery disease)    Coronary artery disease involving native  coronary artery of native heart with angina pectoris with documented spasm (HCC)    NSTEMI (non-ST elevated myocardial infarction) ([place]) 03/19/2016   HTN (hypertension)    High blood cholesterol level    Acid reflux    Hyperlipidemia 12/27/2008   Essential hypertension 12/27/2008   Coronary atherosclerosis 12/27/2008   Aortic valve disorder 12/27/2008   Esophagitis, reflux 06/06/2006   Hypertensive heart disease 08/10/2003    Past Surgical History:  Procedure Laterality Date   CARDIAC CATHETERIZATION  2000   left heart   CARDIAC CATHETERIZATION N/A 03/22/2016   Procedure: Left Heart Cath And Coronary Angiography;  Surgeon: Minna Merritts, MD;  Location: [facility];  Service: Cardiovascular;  Laterality: N/A;   COLONOSCOPY  2008   Dr. Allen Norris    Prior to Admission medications   Medication Sig Start Date End Date Taking? Authorizing Provider  aspirin 81 MG tablet Take 81 mg by mouth daily.    [provider]  atorvastatin (LIPITOR) 20 MG tablet TAKE 1 TABLET(20 MG) BY MOUTH DAILY AT 6 PM 02/23/21   Chrismon, Simona Huh E, PA-C  bismuth subsalicylate (PEPTO BISMOL) 262 MG/15ML suspension Take 30 mLs by mouth every 6 (six) hours as needed (for nausea, and vomiting.).    [provider]  carvedilol (COREG) 12.5 MG tablet TAKE 1 TABLET(12.5 MG) BY MOUTH TWICE DAILY 02/05/20   Chrismon, Vickki Muff, PA-C  chlorthalidone (HYGROTON) 25 MG tablet TAKE 1 TABLET(25 MG) BY MOUTH DAILY 02/10/21   Wellington Hampshire, MD  enalapril (VASOTEC) 20 MG tablet TAKE 1 TABLET(20 MG) BY MOUTH DAILY 02/05/20   Chrismon, Vickki Muff, PA-C  ibuprofen (ADVIL) 200 MG tablet Take 200 mg by mouth every 6 (six) hours as needed for moderate pain.    [provider]  loperamide (IMODIUM A-D) 2 MG tablet Take 2 mg by mouth 4 (four) times daily as needed for diarrhea or loose stools.    [provider]  nitroGLYCERIN (NITROSTAT) 0.4 MG SL tablet Place 0.4 mg under the tongue every 5 (five)  minutes as needed.    [provider]  omeprazole (PRILOSEC) 20 MG capsule TAKE 2 CAPSULES BY MOUTH EVERY DAY 12/29/20   Chrismon, Vickki Muff, PA-C  silodosin (RAPAFLO) 8 MG CAPS capsule Take 1 capsule (8 mg total) by mouth daily with breakfast. Patient taking differently: Take 8 mg by mouth daily with breakfast. For 1 month to end 02-11-21. 01/14/21   Stoioff, Ronda Fairly, MD  lovastatin (ALTOPREV) 40 MG 24 hr tablet Take 80 mg by mouth at bedtime.    05/31/19  [provider]  simvastatin (ZOCOR) 40 MG tablet Take 40 mg by mouth at bedtime.    10/20/11  [provider]    Allergies Patient has no known allergies.  Family History  Family history unknown: Yes    Social History Social History   Tobacco Use   Smoking status: Never   Smokeless tobacco: Never  Vaping Use   Vaping Use: Never used  Substance Use Topics   Alcohol use: No   Drug use: No    Review of Systems Constitutional: No fever. Eyes: No visual changes. ENT: No sore throat. Cardiovascular: Denies chest pain. Respiratory: + shortness of breath. Gastrointestinal: No nausea, vomiting, diarrhea. Genitourinary: Negative for dysuria. Musculoskeletal: Negative for back pain. Skin: Negative for rash. Neurological: Negative for focal weakness or numbness.  ____________________________________________   PHYSICAL EXAM:  VITAL SIGNS: ED Triage Vitals  Enc Vitals Group     BP 02/27/21 0212 129/90     Pulse Rate 02/27/21 0212 69     Resp 02/27/21 0212 18     Temp 02/27/21 0212 97.7 F (36.5 C)     Temp Source 02/27/21 0212 Oral     SpO2 02/27/21 0211 97 %     Weight 02/27/21 0212 160 lb (72.6 kg)     Height 02/27/21 0212 '5\' 9"'$  (1.753 m)     Head Circumference --      Peak Flow --      Pain Score 02/27/21 0213 0     Pain Loc --      Pain Edu? --      Excl. in GC? --    CONSTITUTIONAL: Alert and oriented x 4.  Elderly.  In no distress. HEAD: Normocephalic EYES: Conjunctivae clear, pupils  appear equal, EOM appear intact ENT: normal nose; moist mucous membranes NECK: Supple, normal ROM CARD: RRR; S1 and S2 appreciated; no murmurs, no clicks, no rubs, no gallops RESP: Patient has rhonchorous breath sounds diffusely.  No wheezing or rales.  Slightly diminished at bases bilaterally.  No hypoxia or respiratory distress.  Speaking full sentences. ABD/GI: Normal bowel sounds; non-distended; soft, non-tender, no rebound, no guarding, no peritoneal signs, no hepatosplenomegaly BACK: The back  appears normal EXT: Normal ROM in all joints; no deformity noted, no edema; no cyanosis, no calf swelling or calf tenderness SKIN: Normal color for age and race; warm; no rash on exposed skin NEURO: Moves all extremities equally PSYCH: The patient's mood and manner are appropriate.  ____________________________________________   LABS (all labs ordered are listed, but only abnormal results are displayed)  Labs Reviewed  CBC WITH DIFFERENTIAL/PLATELET - Abnormal; Notable for the following components:      Result Value   RBC 3.13 (*)    Hemoglobin 9.4 (*)    HCT 29.5 (*)    All other components within normal limits  BASIC METABOLIC PANEL - Abnormal; Notable for the following components:   Glucose, Bld 109 (*)    Calcium 8.5 (*)    All other components within normal limits  BRAIN NATRIURETIC PEPTIDE - Abnormal; Notable for the following components:   B Natriuretic Peptide 149.7 (*)    All other components within normal limits  HEPATIC FUNCTION PANEL - Abnormal; Notable for the following components:   Total Protein 6.2 (*)    Albumin 2.4 (*)    Bilirubin, Direct 0.3 (*)    All other components within normal limits  D-DIMER, QUANTITATIVE - Abnormal; Notable for the following components:   D-Dimer, Quant 4.66 (*)    All other components within normal limits  RESP PANEL BY RT-PCR (FLU A&B, COVID) ARPGX2  LIPASE, BLOOD  URINALYSIS, COMPLETE (UACMP) WITH MICROSCOPIC  PROCALCITONIN   TROPONIN I (HIGH SENSITIVITY)  TROPONIN I (HIGH SENSITIVITY)   ____________________________________________  EKG   EKG Interpretation  Date/Time:  Friday February 27 2021 02:16:33 EDT Ventricular Rate:  68 PR Interval:  181 QRS Duration: 157 QT Interval:  443 QTC Calculation: 472 R Axis:   -7 Text Interpretation: Sinus rhythm Right bundle branch block Inferior infarct, old Baseline wander in lead(s) V6 No significant change since last tracing Confirmed by Pryor Curia [phone]) on 02/27/2021 2:27:01 AM        ____________________________________________  RADIOLOGY Angelus Foot Orzechowski, personally viewed and evaluated these images (plain radiographs) as part of my medical decision making, as well as reviewing the written report by the radiologist.  ED MD interpretation: Chest x-ray clear.  CTA of the chest shows no PE.  He has mild collapse/consolidation bibasilarly thought likely due to atelectasis however infection not excluded.  Still found to have cholelithiasis as well as signs of pancreatitis.  Official radiology report(s): DG Chest 2 View  Result Date: 02/27/2021 CLINICAL DATA:  Shortness of breath EXAM: CHEST - 2 VIEW COMPARISON:  02/07/2021 FINDINGS: The heart size and mediastinal contours are within normal limits. Both lungs are clear. The visualized skeletal structures are unremarkable. IMPRESSION: No active cardiopulmonary disease. Electronically Signed   By: Ulyses Jarred M.D.   On: 02/27/2021 02:53   CT Angio Chest PE W and/or Wo Contrast  Result Date: 02/27/2021 CLINICAL DATA:  Labored breathing.  Elevated D-dimer.  PE suspected. EXAM: CT ANGIOGRAPHY CHEST WITH CONTRAST TECHNIQUE: Multidetector CT imaging of the chest was performed using the standard protocol during bolus administration of intravenous contrast. Multiplanar CT image reconstructions and MIPs were obtained to evaluate the vascular anatomy. CONTRAST:  5m OMNIPAQUE IOHEXOL 350 MG/ML SOLN COMPARISON:  None.  FINDINGS: Cardiovascular: The heart is enlarged. Tiny Pericardial effusion. Ascending thoracic aorta measures 4.0 cm diameter. Enlargement of the pulmonary outflow tract and main pulmonary arteries suggests pulmonary arterial hypertension. There is no filling defect within the opacified pulmonary arteries to suggest the  presence of an acute pulmonary embolus. Mediastinum/Nodes: No mediastinal lymphadenopathy. There is no hilar lymphadenopathy. The esophagus has normal imaging features. There is no axillary lymphadenopathy. Lungs/Pleura: Mild collapse/consolidation noted both posterior lung bases, right greater than left, likely atelectasis although superimposed infection is not excluded. There is a tiny right pleural effusion. Upper Abdomen: Perihepatic ascites noted. Layering sludge and 8 mm calcified stone noted in the partially visualized gallbladder. Extensive edema/inflammation noted around the visualized portion of the pancreas. Multiple cysts noted in the visualized upper kidneys, better characterized on previous MRI. Musculoskeletal: No worrisome lytic or sclerotic osseous abnormality. Review of the MIP images confirms the above findings. IMPRESSION: 1. No CT evidence for acute pulmonary embolus. 2. Enlargement of the pulmonary outflow tract and main pulmonary arteries suggests pulmonary arterial hypertension. 3. Mild collapse/consolidation both posterior lung bases, right greater than left, likely atelectasis although superimposed infection is not excluded. Tiny right pleural effusion. 4. Perihepatic ascites. 5. Cholelithiasis with layering sludge and calcified stones in the partially visualized gallbladder. 6. Extensive edema/inflammation around the visualized portion of the pancreas. Imaging features compatible with known pancreatitis. 7. Aortic Atherosclerosis (ICD10-I70.0). Electronically Signed   By: Misty Stanley M.D.   On: 02/27/2021 05:29     ____________________________________________   PROCEDURES  Procedure(s) performed (including Critical Care):  Procedures    ____________________________________________   INITIAL IMPRESSION / ASSESSMENT AND PLAN / ED COURSE  As part of my medical decision making, I reviewed the following data within the [facility] notes reviewed and incorporated, Labs reviewed , EKG interpreted , Old EKG reviewed, Old chart reviewed, Radiograph reviewed , and Notes from prior ED visits       Patient here with shortness of breath that is improving.  Hemodynamically stable with no oxygen requirement.  Differential includes pneumonia, pulmonary edema, pneumothorax, ACS, PE, bronchospasm, anxiety.  Did have similar presentation recently at [facility] thought secondary to pulmonary edema requiring IV diuresis.  Did have oxygen requirement at that time.  Does not wear oxygen chronically.  Labs, x-ray pending.  EKG shows no new ischemic change or arrhythmia.  ED PROGRESS  Patient's labs show normal white blood cell count.  Hemoglobin 9.4 which appears actually improved from last hemoglobin of 8.4 on 02/25/2021 at [facility].  Troponin normal.  BNP only minimally elevated at 149.  Chest x-ray clear.  D-dimer significantly elevated.  We will proceed with CTA of the chest.  6:35 AM  Pt reports feeling much better.  Continues to be hemodynamically stable with no oxygen requirement or increased work of breathing.  CT of the chest shows no PE.  There are signs consistent with pulmonary hypertension.  He has collapse/consolidation of his bases bilaterally that could represent atelectasis but infection is not excluded.  He has no fever or leukocytosis here and has not had any cough.  We will add on a procalcitonin but I suspect this is more likely atelectasis.  No signs of pulmonary edema.  I wanted to obtain an ambulatory sat but patient states since his recent extended hospitalization he has  not been ambulating.  His CT scan again shows cholelithiasis and again extensive pancreatitis however his LFTs and lipase today have improved and his abdominal exam is benign and he has not been vomiting.  Second troponin negative.  Low suspicion that this is his anginal equivalent.  If procalcitonin is unremarkable and patient continues to feel well, anticipate discharge back to [facility].  He is comfortable with this plan.  7:13  AM  Patient's procalcitonin is normal.  I feel he is safe to be discharged back to [facility].  I have contacted his wife for an update.  At this time, I do not feel there is any life-threatening condition present. I have reviewed, interpreted and discussed all results (EKG, imaging, lab, urine as appropriate) and exam findings with patient/family. I have reviewed nursing notes and appropriate previous records.  I feel the patient is safe to be discharged home without further emergent workup and can continue workup as an outpatient as needed. Discussed usual and customary return precautions. Patient/family verbalize understanding and are comfortable with this plan.  Outpatient follow-up has been provided as needed. All questions have been answered.  ____________________________________________   FINAL CLINICAL IMPRESSION(S) / ED DIAGNOSES  Final diagnoses:  SOB (shortness of breath)     ED Discharge Orders     None       *Please note:  James Mora was evaluated in Emergency Department on 02/27/2021 for the symptoms described in the history of present illness. He was evaluated in the context of the global COVID-19 pandemic, which necessitated consideration that the patient might be at risk for infection with the SARS-CoV-2 virus that causes COVID-19. Institutional protocols and algorithms that pertain to the evaluation of patients at risk for COVID-19 are in a state of rapid change based on information released by regulatory bodies including the CDC  and federal and state organizations. These policies and algorithms were followed during the patient's care in the ED.  Some ED evaluations and interventions may be delayed as a result of limited staffing during and the pandemic.*   Note:  This document was prepared using Dragon voice recognition software and may include unintentional dictation errors.    Frenkel, Delice Bison, DO 02/27/21 [place], Delice Bison, DO 02/27/21 [phone]

## 2021-02-27 NOTE — ED Triage Notes (Signed)
Per facility ([facility]) the patient was hard to arouse and had labored breathing. Pt endorsed anxiety - hx of anxiety. No other concerns at this time.

## 2021-02-27 NOTE — ED Notes (Signed)
Pts wife provided an update [phone] (M. Martino)

## 2021-02-27 NOTE — ED Notes (Signed)
Pt in XR. 

## 2021-03-20 ENCOUNTER — Telehealth: Payer: Self-pay

## 2021-03-20 ENCOUNTER — Telehealth: Payer: Self-pay | Admitting: Family Medicine

## 2021-03-20 NOTE — Telephone Encounter (Signed)
Home Health Verbal Orders - Caller/Agency: [facility]// [facility] Number: [phone] secure  Requesting OT/PT/Skilled Nursing/Social Work/Speech Therapy: PT Frequency: 2w2, 1w6

## 2021-03-20 NOTE — Telephone Encounter (Signed)
Advised patient that dennis will be out of the office. Appt scheduled w/ Daneil Dan for medication f/u

## 2021-03-20 NOTE — Telephone Encounter (Signed)
Copied from [place] [id number]. Topic: Appointment Scheduling - Scheduling Inquiry for Clinic >> Mar 20, 2021 10:49 AM Scherrie Gerlach wrote: Reason for CRM: pt goes back to [facility] for check up on 8/24 and was instructed to see his pcp Simona Huh before this appt.  No appts. Pt needs morning appt time. Please advise

## 2021-03-23 NOTE — Progress Notes (Signed)
Established patient visit   Patient: James Mora   DOB: 1936/02/08   85 y.o. Male  MRN: [id number] Visit Date: 03/24/2021  Today's healthcare provider: [name], [name]   Chief Complaint  Patient presents with   Hypertension   Hyperlipidemia   Subjective  -------------------------------------------------------------------------------------------------------------------- HPI  Hypertension, follow-up  BP Readings from Last 3 Encounters:  03/24/21 (!) 77/51  02/27/21 (!) 144/76  02/10/21 (!) 146/84   Wt Readings from Last 3 Encounters:  03/24/21 166 lb (75.3 kg)  02/27/21 160 lb (72.6 kg)  02/01/21 162 lb 0.6 oz (73.5 kg)     He was last seen for hypertension 3 months ago.  BP at that visit was 164/72. Management since that visit includes none.  He reports good compliance with treatment. He is not having side effects.  He is following a Regular diet. He is not exercising. He does not smoke.  Use of agents associated with hypertension: NSAIDS.   Outside blood pressures are not being checked. Symptoms: No chest pain No chest pressure  No palpitations No syncope  Yes dyspnea No orthopnea  No paroxysmal nocturnal dyspnea No lower extremity edema   Pertinent labs: Lab Results  Component Value Date   CHOL 125 02/01/2021   HDL 49 02/01/2021   LDLCALC 62 02/01/2021   TRIG 68 02/01/2021   CHOLHDL 2.6 02/01/2021   Lab Results  Component Value Date   NA 137 02/27/2021   K 4.0 02/27/2021   CREATININE 1.17 02/27/2021   GFRNONAA >60 02/27/2021   GFRAA 46 (L) 02/05/2020   GLUCOSE 109 (H) 02/27/2021     The ASCVD Risk score Mikey Bussing DC Jr., et al., 2013) failed to calculate for the following reasons:   The 2013 ASCVD risk score is only valid for ages 13 to 31   The patient has a prior MI or stroke diagnosis   ---------------------------------------------------------------------------------------------------  Lipid/Cholesterol, Follow-up  Last lipid panel  Other pertinent labs  Lab Results  Component Value Date   CHOL 125 02/01/2021   HDL 49 02/01/2021   LDLCALC 62 02/01/2021   TRIG 68 02/01/2021   CHOLHDL 2.6 02/01/2021   Lab Results  Component Value Date   ALT 25 02/27/2021   AST 30 02/27/2021   PLT 243 02/27/2021   TSH 5.170 (H) 01/02/2021     He was last seen for this 3 months ago.  Management since that visit includes none.  He reports excellent compliance with treatment. He is not having side effects.   Symptoms: No chest pain No chest pressure/discomfort  Yes dyspnea No lower extremity edema  No numbness or tingling of extremity No orthopnea  No palpitations No paroxysmal nocturnal dyspnea  No speech difficulty No syncope   Current diet: in general, an "unhealthy" diet Current exercise: no regular exercise  The ASCVD Risk score ([place]., et al., 2013) failed to calculate for the following reasons:   The 2013 ASCVD risk score is only valid for ages 26 to 20   The patient has a prior MI or stroke diagnosis  ---------------------------------------------------------------------------------------------------       Medications: Outpatient Medications Prior to Visit  Medication Sig   aspirin 81 MG tablet Take 81 mg by mouth daily.   atorvastatin (LIPITOR) 20 MG tablet TAKE 1 TABLET(20 MG) BY MOUTH DAILY AT 6 PM   bismuth subsalicylate (PEPTO BISMOL) 262 MG/15ML suspension Take 30 mLs by mouth every 6 (six) hours as needed (for nausea, and vomiting.).  chlorthalidone (HYGROTON) 25 MG tablet TAKE 1 TABLET(25 MG) BY MOUTH DAILY   ibuprofen (ADVIL) 200 MG tablet Take 200 mg by mouth every 6 (six) hours as needed for moderate pain.   lisinopril (ZESTRIL) 40 MG tablet Take 40 mg by mouth daily.   nitroGLYCERIN (NITROSTAT) 0.4 MG SL tablet Place 0.4 mg under the tongue every 5 (five) minutes as needed.   omeprazole (PRILOSEC) 20 MG capsule TAKE 2 CAPSULES BY MOUTH EVERY DAY   pantoprazole (PROTONIX) 40 MG tablet Take 40  mg by mouth daily.   silodosin (RAPAFLO) 8 MG CAPS capsule Take 1 capsule (8 mg total) by mouth daily with breakfast. (Patient taking differently: Take 8 mg by mouth daily with breakfast. For 1 month to end 02-11-21.)   tamsulosin (FLOMAX) 0.4 MG CAPS capsule Take 0.4 mg by mouth daily.   Thiamine HCl (B-1) 100 MG TABS Take 1 tablet by mouth daily.   [DISCONTINUED] amLODipine (NORVASC) 5 MG tablet Take 5 mg by mouth daily.   [DISCONTINUED] carvedilol (COREG) 12.5 MG tablet TAKE 1 TABLET(12.5 MG) BY MOUTH TWICE DAILY   [DISCONTINUED] enalapril (VASOTEC) 20 MG tablet TAKE 1 TABLET(20 MG) BY MOUTH DAILY   [DISCONTINUED] loperamide (IMODIUM A-D) 2 MG tablet Take 2 mg by mouth 4 (four) times daily as needed for diarrhea or loose stools.   No facility-administered medications prior to visit.    Review of Systems      Objective  -------------------------------------------------------------------------------------------------------------------- BP (!) 77/51   Pulse 74   Temp 97.8 F (36.6 C) (Oral)   Resp 14   Wt 166 lb (75.3 kg) Comment: wife reports  SpO2 98%   BMI 24.51 kg/m     Physical Exam Vitals and nursing note reviewed.  Constitutional:      General: He is awake. He is not in acute distress.    Appearance: Normal appearance. He is well-developed and normal weight. He is not ill-appearing, toxic-appearing or diaphoretic.  Cardiovascular:     Rate and Rhythm: Normal rate and regular rhythm.     Pulses:          Radial pulses are 2+ on the right side and 2+ on the left side.       Dorsalis pedis pulses are 1+ on the right side and 1+ on the left side.       Posterior tibial pulses are 1+ on the right side and 1+ on the left side.     Heart sounds: Normal heart sounds, S1 normal and S2 normal. No murmur heard.   No friction rub. No gallop.  Pulmonary:     Effort: Pulmonary effort is normal.     Breath sounds: Normal breath sounds and air entry.  Musculoskeletal:         General: Normal range of motion.     Right lower leg: No edema.  Skin:    General: Skin is warm and dry.     Capillary Refill: Capillary refill takes less than 2 seconds.     Coloration: Skin is pale. Skin is not jaundiced.     Findings: No bruising, erythema, lesion or rash.  Neurological:     General: No focal deficit present.     Mental Status: He is alert and oriented to person, place, and time.     Sensory: Sensation is intact.     Motor: Weakness present.     Coordination: Coordination is intact.     Gait: Gait abnormal.     Comments: Weakness following  recent hospitalization   Psychiatric:        Attention and Perception: Attention and perception normal.        Mood and Affect: Mood normal. Affect is not flat.        Speech: Speech normal.        Behavior: Behavior normal. Behavior is cooperative.        Thought Content: Thought content normal.        Cognition and Memory: Cognition normal.        Judgment: Judgment normal.     No results found for any visits on 03/24/21.  Assessment & Plan  ---------------------------------------------------------------------------------------------------------------------  Patient AxO x4; awake in W/C.  Pt accompanied by SO and family member, niece (Ginger).   Despite low blood pressure; pt is able to stand with minimal assist and follows all commands and answers questions appropriately.  Pt is in no acute distress; labs drawn, pt unable to provide urine sample at this time.  Pt has follow up appt next week with specialist, and should return to clinic for BP monitoring in 2-4 weeks from today.  Problem List Items Addressed This Visit       Cardiovascular and Mediastinum   Hypotension due to hypovolemia - Primary    BP low today on both manual and automatic checks Both arms used Pt denies any CP, SOB, dizziness, or syncope 1 episode of "feeling hot" 8/15 Pt able to get up with light 1 assist; does not rock BP meds held starting  today Check BP meds at home and continue to watch By the end of the week SBP >100 and DBP >60 Watch HR, goal >50 Call 911 if the patient is not alert, is sleeping >50% of the day, and has poor oral intake/poor urine production.      Relevant Medications   lisinopril (ZESTRIL) 40 MG tablet   Other Relevant Orders   CBC with Differential/Platelet   Comprehensive metabolic panel     Musculoskeletal and Integument   Pressure injury of sacral region, stage 2 (HCC)    Small areas of open abrasions on both cheeks near gluteal cleft 3 cm round each Advised avoiding incontinence briefs given no incontinence Recommend use of barrier cream ex zinc oxide Suggest position changes every hour Increase protein to allow wound healing        Genitourinary   Benign prostatic hyperplasia without lower urinary tract symptoms    Discussed importance of medication and role with BP medications Continue taking and report any incontinence if occuring      Relevant Medications   tamsulosin (FLOMAX) 0.4 MG CAPS capsule     Other   Dehydration    Marked tenting on posterior hands Recommend goal of 8-8 oz of water per day, continue to make gradual increases in water consumption Stop BP medications; pt and family (SO and niece) unaware if pt is still taking diuretic      Relevant Orders   CBC with Differential/Platelet   Comprehensive metabolic panel     Return in about 2 weeks (around 04/07/2021) for blood pressure check. Video visit allowed, gather record of blood pressure records.   Vonna Kotyk, [name], have reviewed all documentation for this visit. The documentation on 03/24/21 for the exam, diagnosis, procedures, and orders are all accurate and complete.    [name], [place] [phone] (phone) [phone] (fax)  [place]

## 2021-03-24 ENCOUNTER — Encounter: Payer: Self-pay | Admitting: Family Medicine

## 2021-03-24 ENCOUNTER — Telehealth: Payer: Self-pay | Admitting: Family Medicine

## 2021-03-24 ENCOUNTER — Telehealth: Payer: Self-pay

## 2021-03-24 ENCOUNTER — Ambulatory Visit (INDEPENDENT_AMBULATORY_CARE_PROVIDER_SITE_OTHER): Payer: Medicare Other | Admitting: Family Medicine

## 2021-03-24 ENCOUNTER — Other Ambulatory Visit: Payer: Self-pay

## 2021-03-24 VITALS — BP 77/51 | HR 74 | Temp 97.8°F | Resp 14 | Wt 166.0 lb

## 2021-03-24 DIAGNOSIS — L89152 Pressure ulcer of sacral region, stage 2: Secondary | ICD-10-CM | POA: Insufficient documentation

## 2021-03-24 DIAGNOSIS — E861 Hypovolemia: Secondary | ICD-10-CM | POA: Insufficient documentation

## 2021-03-24 DIAGNOSIS — E860 Dehydration: Secondary | ICD-10-CM | POA: Insufficient documentation

## 2021-03-24 DIAGNOSIS — I9589 Other hypotension: Secondary | ICD-10-CM

## 2021-03-24 DIAGNOSIS — N400 Enlarged prostate without lower urinary tract symptoms: Secondary | ICD-10-CM

## 2021-03-24 NOTE — Assessment & Plan Note (Signed)
Small areas of open abrasions on both cheeks near gluteal cleft 3 cm round each Advised avoiding incontinence briefs given no incontinence Recommend use of barrier cream ex zinc oxide Suggest position changes every hour Increase protein to allow wound healing

## 2021-03-24 NOTE — Telephone Encounter (Signed)
Copied from [place] [phone]. Topic: General - Inquiry >> Mar 24, 2021  3:40 PM Valere Dross wrote: Reason for CRM: Pts wife called in stating PCP cancelled one of his medication, but she states he still have two more medications that were not on the list, and wants to know what he needs to do. Please advise.

## 2021-03-24 NOTE — Telephone Encounter (Signed)
Advised 

## 2021-03-24 NOTE — Assessment & Plan Note (Signed)
Marked tenting on posterior hands Recommend goal of 8-8 oz of water per day, continue to make gradual increases in water consumption Stop BP medications; pt and family (SO and niece) unaware if pt is still taking diuretic

## 2021-03-24 NOTE — Assessment & Plan Note (Signed)
BP low today on both manual and automatic checks Both arms used Pt denies any CP, SOB, dizziness, or syncope 1 episode of "feeling hot" 8/15 Pt able to get up with light 1 assist; does not rock BP meds held starting today Check BP meds at home and continue to watch By the end of the week SBP >100 and DBP >60 Watch HR, goal >50 Call 911 if the patient is not alert, is sleeping >50% of the day, and has poor oral intake/poor urine production.

## 2021-03-24 NOTE — Telephone Encounter (Signed)
That's fine

## 2021-03-24 NOTE — Assessment & Plan Note (Signed)
Discussed importance of medication and role with BP medications Continue taking and report any incontinence if occuring

## 2021-03-24 NOTE — Telephone Encounter (Signed)
FYI, please review. KW 

## 2021-03-24 NOTE — Telephone Encounter (Signed)
James Mora with [employer] called to let Daneil Dan know that prior to starting exercises, pt's bp was 75/51. He thought best to just do some sitting exeresis and some light walking. After doing these activities, pt's bp actually got better:  100/61    Unless it gets real low again, he will not call back. Cb [phone]

## 2021-03-25 ENCOUNTER — Telehealth: Payer: Self-pay

## 2021-03-25 ENCOUNTER — Other Ambulatory Visit (INDEPENDENT_AMBULATORY_CARE_PROVIDER_SITE_OTHER): Payer: Medicare Other | Admitting: Family Medicine

## 2021-03-25 DIAGNOSIS — E860 Dehydration: Secondary | ICD-10-CM | POA: Diagnosis not present

## 2021-03-25 LAB — CBC WITH DIFFERENTIAL/PLATELET
Basophils Absolute: 0 10*3/uL (ref 0.0–0.2)
Basos: 1 %
EOS (ABSOLUTE): 0.1 10*3/uL (ref 0.0–0.4)
Eos: 1 %
Hematocrit: 29.8 % — ABNORMAL LOW (ref 37.5–51.0)
Hemoglobin: 9.5 g/dL — ABNORMAL LOW (ref 13.0–17.7)
Immature Grans (Abs): 0 10*3/uL (ref 0.0–0.1)
Immature Granulocytes: 0 %
Lymphocytes Absolute: 1.8 10*3/uL (ref 0.7–3.1)
Lymphs: 31 %
MCH: 29.5 pg (ref 26.6–33.0)
MCHC: 31.9 g/dL (ref 31.5–35.7)
MCV: 93 fL (ref 79–97)
Monocytes Absolute: 0.4 10*3/uL (ref 0.1–0.9)
Monocytes: 7 %
Neutrophils Absolute: 3.5 10*3/uL (ref 1.4–7.0)
Neutrophils: 60 %
Platelets: 224 10*3/uL (ref 150–450)
RBC: 3.22 x10E6/uL — ABNORMAL LOW (ref 4.14–5.80)
RDW: 14.6 % (ref 11.6–15.4)
WBC: 5.9 10*3/uL (ref 3.4–10.8)

## 2021-03-25 LAB — POCT URINALYSIS DIPSTICK
Bilirubin, UA: NEGATIVE
Blood, UA: NEGATIVE
Glucose, UA: NEGATIVE
Ketones, UA: NEGATIVE
Leukocytes, UA: NEGATIVE
Nitrite, UA: NEGATIVE
Protein, UA: NEGATIVE
Spec Grav, UA: <=1.005 — AB (ref 1.010–1.025)
Urobilinogen, UA: 0.2 E.U./dL
pH, UA: 5 (ref 5.0–8.0)

## 2021-03-25 LAB — COMPREHENSIVE METABOLIC PANEL
ALT: 9 IU/L (ref 0–44)
AST: 14 IU/L (ref 0–40)
Albumin/Globulin Ratio: 0.9 — ABNORMAL LOW (ref 1.2–2.2)
Albumin: 2.9 g/dL — ABNORMAL LOW (ref 3.6–4.6)
Alkaline Phosphatase: 111 IU/L (ref 44–121)
BUN/Creatinine Ratio: 17 (ref 10–24)
BUN: 23 mg/dL (ref 8–27)
Bilirubin Total: 0.5 mg/dL (ref 0.0–1.2)
CO2: 24 mmol/L (ref 20–29)
Calcium: 8.7 mg/dL (ref 8.6–10.2)
Chloride: 105 mmol/L (ref 96–106)
Creatinine, Ser: 1.32 mg/dL — ABNORMAL HIGH (ref 0.76–1.27)
Globulin, Total: 3.1 g/dL (ref 1.5–4.5)
Glucose: 122 mg/dL — ABNORMAL HIGH (ref 65–99)
Potassium: 3.9 mmol/L (ref 3.5–5.2)
Sodium: 142 mmol/L (ref 134–144)
Total Protein: 6 g/dL (ref 6.0–8.5)
eGFR: 54 mL/min/{1.73_m2} — ABNORMAL LOW (ref >59)

## 2021-03-25 NOTE — Progress Notes (Signed)
No concerns with urine sample; continue to push fluids for obvious dehydration in office 8/16

## 2021-03-25 NOTE — Telephone Encounter (Signed)
Niece walked in office this morning to review over medication list, please see telephone encounter from today 03/25/21. KW

## 2021-03-25 NOTE — Telephone Encounter (Signed)
Contacted patients niece back to review over medication list that was adjusted yesterday at office visit. Patients niece states that she is at work and request that I call her back at a later time to discuss. KW

## 2021-03-25 NOTE — Telephone Encounter (Signed)
Patients niece request call back to review over patients medication list.KW

## 2021-03-25 NOTE — Progress Notes (Signed)
Hello,    Your lab results have returned.  Your red blood cells, hemoglobin and hematocrit are low.  Continue to eat a diet high in iron.  Examples: red meat, such as beef. organ meat, such as kidney and liver. dark, leafy, green vegetables, such as spinach and kale. dried fruits, such as prunes and raisins. beans. legumes. egg yolks.  These levels indicate mild anemia; however, appear stable with previous blood work.  OTC Iron supplements can help, but be aware they can cause constipation and upset stomach.  Recommend taking with something like OJ to help absorb.  No signs of infection on labs.  Your kidney levels, creatinine and eGFR, show mild dehydration.  Continue to drink water and protein shakes to help build your strength and nutrition status.  Please let us know if you have any questions.  Thank you,  Tally Joe, [name]

## 2021-03-31 ENCOUNTER — Other Ambulatory Visit: Payer: Self-pay

## 2021-03-31 ENCOUNTER — Telehealth (INDEPENDENT_AMBULATORY_CARE_PROVIDER_SITE_OTHER): Payer: Medicare Other | Admitting: Family Medicine

## 2021-03-31 ENCOUNTER — Encounter: Payer: Self-pay | Admitting: Family Medicine

## 2021-03-31 VITALS — BP 93/64 | HR 101

## 2021-03-31 DIAGNOSIS — I9589 Other hypotension: Secondary | ICD-10-CM | POA: Diagnosis not present

## 2021-03-31 DIAGNOSIS — L89152 Pressure ulcer of sacral region, stage 2: Secondary | ICD-10-CM | POA: Diagnosis not present

## 2021-03-31 DIAGNOSIS — R630 Anorexia: Secondary | ICD-10-CM | POA: Diagnosis not present

## 2021-03-31 DIAGNOSIS — N400 Enlarged prostate without lower urinary tract symptoms: Secondary | ICD-10-CM | POA: Diagnosis not present

## 2021-03-31 DIAGNOSIS — K5900 Constipation, unspecified: Secondary | ICD-10-CM | POA: Insufficient documentation

## 2021-03-31 DIAGNOSIS — E861 Hypovolemia: Secondary | ICD-10-CM

## 2021-03-31 NOTE — Assessment & Plan Note (Signed)
Patient voiding every 3 hrs, throughout day Wife helping push fluids Has been drinking propel peach Continue to monitor BPH symptoms- may start rapaflo at next f/u

## 2021-03-31 NOTE — Assessment & Plan Note (Signed)
Blood pressure has improved Typically range is 90-110/60-70s HR typically 70-90s Continue to hold stopped blood pressure medications Continue to encourage oral intake

## 2021-03-31 NOTE — Assessment & Plan Note (Signed)
Continues to cause pain Recommend OTC treatment like topical pain- wife already trying solonpas with some relief Continue to use barrier cream Using purchased cushion for off loading Recommend high protein diet to help with healing Continue to be off site every 1 hr when awake, shifting weight to hips

## 2021-03-31 NOTE — Assessment & Plan Note (Signed)
Appetite improving; pt still picky about what he chooses to eat Add Ensure/protein supplements throughout day- with pills and as snack, not as meal replacement Reinforced protein helps wound healing and build strength

## 2021-03-31 NOTE — Assessment & Plan Note (Signed)
New concern Recommend increasing activity Can add miralax or metamucil to diet Continue to watch fluid intake

## 2021-03-31 NOTE — Progress Notes (Signed)
MyChart Video Visit    Virtual Visit via Video Note   This visit type was conducted due to national recommendations for restrictions regarding the COVID-19 Pandemic (e.g. social distancing) in an effort to limit this patient's exposure and mitigate transmission in our community. This patient is at least at moderate risk for complications without adequate follow up. This format is felt to be most appropriate for this patient at this time. Physical exam was limited by quality of the video and audio technology used for the visit.   Patient location: home Provider location: [facility]  I discussed the limitations of evaluation and management by telemedicine and the availability of in person appointments. The patient expressed understanding and agreed to proceed.  Patient: James Mora   DOB: 12-09-1935   86 y.o. Male  MRN: [id number] Visit Date: 03/31/2021  Today's healthcare provider: [name], [name]   Chief Complaint  Patient presents with   Hypotension   Subjective    HPI  Follow up for blood pressure  The patient was last seen for this 1 weeks ago. Changes made at last visit include continue to check blood pressure at home.  He reports excellent compliance with treatment. He feels that condition is Improved. He is not having side effects.   -----------------------------------------------------------------------------------------   Patient Active Problem List   Diagnosis Date Noted   Poor appetite 03/31/2021   Constipation 03/31/2021   Hypotension due to hypovolemia 03/24/2021   Dehydration 03/24/2021   Pressure injury of sacral region, stage 2 ([place]) 03/24/2021   Gallstone pancreatitis    Shortness of breath    Gallstones    Enteritis    Acute pancreatitis 02/01/2021   Narrowing of intervertebral disc space 06/18/2016   Elevated prostate specific antigen (PSA) 06/18/2016   Benign prostatic hyperplasia without lower urinary tract symptoms  06/18/2016   HLD (hyperlipidemia)    Mixed hyperlipidemia    CAD (coronary artery disease)    Coronary artery disease involving native coronary artery of native heart with angina pectoris with documented spasm (HCC)    NSTEMI (non-ST elevated myocardial infarction) ([place]) 03/19/2016   HTN (hypertension)    High blood cholesterol level    Acid reflux    Hyperlipidemia 12/27/2008   Essential hypertension 12/27/2008   Coronary atherosclerosis 12/27/2008   Aortic valve disorder 12/27/2008   Esophagitis, reflux 06/06/2006   Hypertensive heart disease 08/10/2003   Social History   Tobacco Use   Smoking status: Never   Smokeless tobacco: Never  Vaping Use   Vaping Use: Never used  Substance Use Topics   Alcohol use: No   Drug use: No   Allergies  Allergen Reactions   Imodium [Loperamide] Nausea And Vomiting   Hydroxyzine Hcl     Other reaction(s): Other (See Comments) Sedation and myoclonic jerks of all four extremities. Noted 02/19/21.       Medications: Outpatient Medications Prior to Visit  Medication Sig   aspirin 81 MG tablet Take 81 mg by mouth daily.   atorvastatin (LIPITOR) 20 MG tablet TAKE 1 TABLET(20 MG) BY MOUTH DAILY AT 6 PM   bismuth subsalicylate (PEPTO BISMOL) 262 MG/15ML suspension Take 30 mLs by mouth every 6 (six) hours as needed (for nausea, and vomiting.).   ibuprofen (ADVIL) 200 MG tablet Take 200 mg by mouth every 6 (six) hours as needed for moderate pain.   nitroGLYCERIN (NITROSTAT) 0.4 MG SL tablet Place 0.4 mg under the tongue every 5 (five) minutes as needed.  omeprazole (PRILOSEC) 20 MG capsule TAKE 2 CAPSULES BY MOUTH EVERY DAY   Pancrelipase, Lip-Prot-Amyl, (VIOKACE PO) Take by mouth.   tamsulosin (FLOMAX) 0.4 MG CAPS capsule Take 0.4 mg by mouth daily.   Thiamine HCl (B-1) 100 MG TABS Take 1 tablet by mouth daily.   [DISCONTINUED] chlorthalidone (HYGROTON) 25 MG tablet TAKE 1 TABLET(25 MG) BY MOUTH DAILY (Patient not taking: Reported on  03/31/2021)   [DISCONTINUED] lisinopril (ZESTRIL) 40 MG tablet Take 40 mg by mouth daily. (Patient not taking: Reported on 03/31/2021)   [DISCONTINUED] pantoprazole (PROTONIX) 40 MG tablet Take 40 mg by mouth daily. (Patient not taking: Reported on 03/31/2021)   [DISCONTINUED] silodosin (RAPAFLO) 8 MG CAPS capsule Take 1 capsule (8 mg total) by mouth daily with breakfast. (Patient not taking: Reported on 03/31/2021)   No facility-administered medications prior to visit.    Review of Systems  Constitutional:  Negative for activity change and appetite change.  Cardiovascular:  Negative for chest pain and palpitations.  Gastrointestinal:  Negative for nausea.  Neurological:  Negative for dizziness, light-headedness and headaches.   Last metabolic panel Lab Results  Component Value Date   GLUCOSE 122 (H) 03/24/2021   NA 142 03/24/2021   K 3.9 03/24/2021   CL 105 03/24/2021   CO2 24 03/24/2021   BUN 23 03/24/2021   CREATININE 1.32 (H) 03/24/2021   GFRNONAA >60 02/27/2021   GFRAA 46 (L) 02/05/2020   CALCIUM 8.7 03/24/2021   PROT 6.0 03/24/2021   ALBUMIN 2.9 (L) 03/24/2021   LABGLOB 3.1 03/24/2021   AGRATIO 0.9 (L) 03/24/2021   BILITOT 0.5 03/24/2021   ALKPHOS 111 03/24/2021   AST 14 03/24/2021   ALT 9 03/24/2021   ANIONGAP 5 02/27/2021      Objective    BP 93/64   Pulse (!) 101  BP Readings from Last 3 Encounters:  03/31/21 93/64  03/24/21 (!) 77/51  02/27/21 (!) 144/76   Wt Readings from Last 3 Encounters:  03/24/21 166 lb (75.3 kg)  02/27/21 160 lb (72.6 kg)  02/01/21 162 lb 0.6 oz (73.5 kg)      Physical Exam Vitals and nursing note reviewed.  Constitutional:      General: He is not in acute distress.    Appearance: Normal appearance. He is not ill-appearing, toxic-appearing or diaphoretic.  HENT:     Head: Normocephalic and atraumatic.     Right Ear: Ear canal normal.  Cardiovascular:     Rate and Rhythm: Tachycardia present.  Pulmonary:     Effort:  Pulmonary effort is normal.  Neurological:     General: No focal deficit present.     Mental Status: He is alert and oriented to person, place, and time. Mental status is at baseline.       Assessment & Plan     Problem List Items Addressed This Visit       Cardiovascular and Mediastinum   Hypotension due to hypovolemia - Primary    Blood pressure has improved Typically range is 90-110/60-70s HR typically 70-90s Continue to hold stopped blood pressure medications Continue to encourage oral intake         Musculoskeletal and Integument   Pressure injury of sacral region, stage 2 (HCC)    Continues to cause pain Recommend OTC treatment like topical pain- wife already trying solonpas with some relief Continue to use barrier cream Using purchased cushion for off loading Recommend high protein diet to help with healing Continue to be off site every 1  hr when awake, shifting weight to hips        Genitourinary   Benign prostatic hyperplasia without lower urinary tract symptoms    Patient voiding every 3 hrs, throughout day Wife helping push fluids Has been drinking propel peach Continue to monitor BPH symptoms- may start rapaflo at next f/u        Other   Poor appetite    Appetite improving; pt still picky about what he chooses to eat Add Ensure/protein supplements throughout day- with pills and as snack, not as meal replacement Reinforced protein helps wound healing and build strength      Constipation    New concern Recommend increasing activity Can add miralax or metamucil to diet Continue to watch fluid intake        Return in about 4 weeks (around 04/28/2021) for blood pressure check.     I discussed the assessment and treatment plan with the patient. The patient was provided an opportunity to ask questions and all were answered. The patient agreed with the plan and demonstrated an understanding of the instructions.   The patient was advised to call  back or seek an in-person evaluation if the symptoms worsen or if the condition fails to improve as anticipated.  I provided 14 minutes of non-face-to-face time during this encounter.  Vonna Kotyk, [name], have reviewed all documentation for this visit. The documentation on 03/31/21 for the exam, diagnosis, procedures, and orders are all accurate and complete.   [name], [place] [phone] (phone) [phone] (fax)  [place]

## 2021-04-11 ENCOUNTER — Other Ambulatory Visit: Payer: Self-pay | Admitting: Family Medicine

## 2021-04-11 NOTE — Telephone Encounter (Signed)
Requested medication (s) are due for refill today: -  Requested medication (s) are on the active medication list: historical provider  Last refill:  03/24/21  Future visit scheduled: yes  Notes to clinic:  historical med and provider   Requested Prescriptions  Pending Prescriptions Disp Refills   Thiamine HCl (B-1) 100 MG TABS [Pharmacy Med Name: VITAMIN B-1 '100MG'$  TABLETS] 30 tablet     Sig: TAKE 1 TABLET BY MOUTH EVERY DAY     Off-Protocol Failed - 04/11/2021  2:40 PM      Failed - Medication not assigned to a protocol, review manually.      Passed - Valid encounter within last 12 months    Recent Outpatient Visits           1 week ago Hypotension due to hypovolemia   [facility] Tally Joe T, [name]   2 weeks ago Hypotension due to hypovolemia   [facility] [name], [name]   3 months ago Hematuria, unspecified type   [facility], Vickki Muff, PA-C   1 year ago Medicare annual wellness visit, subsequent   [employer] [facility], Vickki Muff, PA-C   3 years ago Pruritus   [place], PA-C       Future Appointments             In 1 week [name], [place], [facility]             tamsulosin (FLOMAX) 0.4 MG CAPS capsule [Pharmacy Med Name: TAMSULOSIN 0.'4MG'$  CAPSULES] 90 capsule     Sig: TAKE [place]     Urology: Alpha-Adrenergic Blocker Passed - 04/11/2021  2:40 PM      Passed - Last BP in normal range    BP Readings from Last 1 Encounters:  03/31/21 93/64          Passed - Valid encounter within last 12 months    Recent Outpatient Visits           1 week ago Hypotension due to hypovolemia   [facility] [name], [name]   2 weeks ago Hypotension due to hypovolemia   [facility] [name], [name]   3 months ago Hematuria, unspecified type   [place], Vickki Muff,  PA-C   1 year ago Medicare annual wellness visit, subsequent   [employer] [facility], Vickki Muff, PA-C   3 years ago Pruritus   [employer], Vickki Muff, PA-C       Future Appointments             In 1 week [name], [place], [facility]

## 2021-04-17 ENCOUNTER — Telehealth: Payer: Self-pay

## 2021-04-17 NOTE — Telephone Encounter (Signed)
Copied from [place] [id number]. Topic: General - Other >> Apr 17, 2021 11:22 AM Pawlus, Brayton Layman A wrote: Reason for CRM: Pt wanted a call back to follow up on his medications, pt wanted to know if he was supposed to keep taking Pancrelipase, Lip-Prot-Amyl, (VIOKACE PO) (previously prescribed by a different provider).

## 2021-04-17 NOTE — Telephone Encounter (Signed)
This may be taken off the market by the FDA in July 2022. A generic may be what he has and should continue it until he has a follow up appointment to follow up his pancreatitis.

## 2021-04-18 ENCOUNTER — Ambulatory Visit (INDEPENDENT_AMBULATORY_CARE_PROVIDER_SITE_OTHER): Payer: Medicare Other | Admitting: *Deleted

## 2021-04-18 DIAGNOSIS — Z0000 Encounter for general adult medical examination without abnormal findings: Secondary | ICD-10-CM

## 2021-04-18 NOTE — Patient Instructions (Signed)
Health Maintenance, Male Adopting a healthy lifestyle and getting preventive care are important in promoting health and wellness. Ask your health care provider about: The right schedule for you to have regular tests and exams. Things you can do on your own to prevent diseases and keep yourself healthy. What should I know about diet, weight, and exercise? Eat a healthy diet  Eat a diet that includes plenty of vegetables, fruits, low-fat dairy products, and lean protein. Do not eat a lot of foods that are high in solid fats, added sugars, or sodium. Maintain a healthy weight Body mass index (BMI) is a measurement that can be used to identify possible weight problems. It estimates body fat based on height and weight. Your health care provider can help determine your BMI and help you achieve or maintain a healthy weight. Get regular exercise Get regular exercise. This is one of the most important things you can do for your health. Most adults should: Exercise for at least 150 minutes each week. The exercise should increase your heart rate and make you sweat (moderate-intensity exercise). Do strengthening exercises at least twice a week. This is in addition to the moderate-intensity exercise. Spend less time sitting. Even light physical activity can be beneficial. Watch cholesterol and blood lipids Have your blood tested for lipids and cholesterol at 83 years of age, then have this test every 5 years. You may need to have your cholesterol levels checked more often if: Your lipid or cholesterol levels are high. You are older than 83 years of age. You are at high risk for heart disease. What should I know about cancer screening? Many types of cancers can be detected early and may often be prevented. Depending on your health history and family history, you may need to have cancer screening at various ages. This may include screening for: Colorectal cancer. Prostate cancer. Skin cancer. Lung  cancer. What should I know about heart disease, diabetes, and high blood pressure? Blood pressure and heart disease High blood pressure causes heart disease and increases the risk of stroke. This is more likely to develop in people who have high blood pressure readings, are of African descent, or are overweight. Talk with your health care provider about your target blood pressure readings. Have your blood pressure checked: Every 3-5 years if you are 18-39 years of age. Every year if you are 40 years old or older. If you are between the ages of 65 and 75 and are a current or former smoker, ask your health care provider if you should have a one-time screening for abdominal aortic aneurysm (AAA). Diabetes Have regular diabetes screenings. This checks your fasting blood sugar level. Have the screening done: Once every three years after age 45 if you are at a normal weight and have a low risk for diabetes. More often and at a younger age if you are overweight or have a high risk for diabetes. What should I know about preventing infection? Hepatitis B If you have a higher risk for hepatitis B, you should be screened for this virus. Talk with your health care provider to find out if you are at risk for hepatitis B infection. Hepatitis C Blood testing is recommended for: Everyone born from 1945 through 1965. Anyone with known risk factors for hepatitis C. Sexually transmitted infections (STIs) You should be screened each year for STIs, including gonorrhea and chlamydia, if: You are sexually active and are younger than 83 years of age. You are older than 83 years   of age and your health care provider tells you that you are at risk for this type of infection. Your sexual activity has changed since you were last screened, and you are at increased risk for chlamydia or gonorrhea. Ask your health care provider if you are at risk. Ask your health care provider about whether you are at high risk for HIV.  Your health care provider may recommend a prescription medicine to help prevent HIV infection. If you choose to take medicine to prevent HIV, you should first get tested for HIV. You should then be tested every 3 months for as long as you are taking the medicine. Follow these instructions at home: Lifestyle Do not use any products that contain nicotine or tobacco, such as cigarettes, e-cigarettes, and chewing tobacco. If you need help quitting, ask your health care provider. Do not use street drugs. Do not share needles. Ask your health care provider for help if you need support or information about quitting drugs. Alcohol use Do not drink alcohol if your health care provider tells you not to drink. If you drink alcohol: Limit how much you have to 0-2 drinks a day. Be aware of how much alcohol is in your drink. In the [place] one drink equals one 12 oz bottle of beer (355 mL), one 5 oz glass of wine (148 mL), or one 1 oz glass of hard liquor (44 mL). General instructions Schedule regular health, dental, and eye exams. Stay current with your vaccines. Tell your health care provider if: You often feel depressed. You have ever been abused or do not feel safe at home. Summary Adopting a healthy lifestyle and getting preventive care are important in promoting health and wellness. Follow your health care provider's instructions about healthy diet, exercising, and getting tested or screened for diseases. Follow your health care provider's instructions on monitoring your cholesterol and blood pressure. This information is not intended to replace advice given to you by your health care provider. Make sure you discuss any questions you have with your health care provider. Document Revised: 10/03/2020 Document Reviewed: 07/19/2018 Elsevier Patient Education  2022 Elsevier Inc.  

## 2021-04-18 NOTE — Progress Notes (Signed)
Subjective:   James Mora is a 85 y.o. male who presents for Medicare Annual/Subsequent preventive examination. I connected with  James Mora on 04/18/21 by a video enabled telemedicine application and verified that I am speaking with the correct person using two identifiers.   I discussed the limitations of evaluation and management by telemedicine. The patient expressed understanding and agreed to proceed.   Location pf patient: Home Location of provider: office  James Mora , James Mora, [place]  Review of Systems     Defer to PCP       Objective:    Today's Vitals   04/18/21 1044  PainSc: 4    There is no height or weight on file to calculate BMI.  Advanced Directives 04/18/2021 02/27/2021 02/01/2021 03/19/2016 03/19/2016  Does Patient Have a Medical Advance Directive? No No No No No  Would patient like information on creating a medical advance directive? No - Patient declined No - Patient declined No - Patient declined No - patient declined information No - patient declined information    Current Medications (verified) Outpatient Encounter Medications as of 04/18/2021  Medication Sig   aspirin 81 MG tablet Take 81 mg by mouth daily.   atorvastatin (LIPITOR) 20 MG tablet TAKE 1 TABLET(20 MG) BY MOUTH DAILY AT 6 PM   bismuth subsalicylate (PEPTO BISMOL) 262 MG/15ML suspension Take 30 mLs by mouth every 6 (six) hours as needed (for nausea, and vomiting.).   ibuprofen (ADVIL) 200 MG tablet Take 200 mg by mouth every 6 (six) hours as needed for moderate pain.   nitroGLYCERIN (NITROSTAT) 0.4 MG SL tablet Place 0.4 mg under the tongue every 5 (five) minutes as needed.   omeprazole (PRILOSEC) 20 MG capsule TAKE 2 CAPSULES BY MOUTH EVERY DAY   Pancrelipase, Lip-Prot-Amyl, (VIOKACE PO) Take by mouth.   tamsulosin (FLOMAX) 0.4 MG CAPS capsule TAKE 1 CAPSULE BY MOUTH EVERY DAY   Thiamine HCl (B-1) 100 MG TABS TAKE 1 TABLET BY MOUTH EVERY DAY   [DISCONTINUED] lovastatin  (ALTOPREV) 40 MG 24 hr tablet Take 80 mg by mouth at bedtime.     [DISCONTINUED] simvastatin (ZOCOR) 40 MG tablet Take 40 mg by mouth at bedtime.     No facility-administered encounter medications on file as of 04/18/2021.    Allergies (verified) Imodium [loperamide] and Hydroxyzine hcl   History: Past Medical History:  Diagnosis Date   Acid reflux    Aortic insufficiency    a. 03/2016 Echo: EF 55-60%, no rwma, Gr1 DD, mild to mod AI, mild TR.   CAD (coronary artery disease)    a. 2000 Cath: Severe distal LAD disease with left to left collaterals;  b. 2008 MV: non-ischemic; c. 03/2016 Cath: LM nl, LAD 30p, D1/D2 mod, nl, LCX nl, OM1/2 nl w/ L-->L collats to apical LAD, RCA dominant, nl. EF 55-65%.   Hypertensive heart disease 2005   unspecified   Mixed hyperlipidemia    mixed   Past Surgical History:  Procedure Laterality Date   CARDIAC CATHETERIZATION  2000   left heart   CARDIAC CATHETERIZATION N/A 03/22/2016   Procedure: Left Heart Cath And Coronary Angiography;  Surgeon: Minna Merritts, MD;  Location: [facility];  Service: Cardiovascular;  Laterality: N/A;   COLONOSCOPY  2008   Dr. Allen Norris   Family History  Family history unknown: Yes   Social History   Socioeconomic History   Marital status: Married    Spouse name: Not on file  Number of children: 1   Years of education: Not on file   Highest education level: Not on file  Occupational History   Not on file  Tobacco Use   Smoking status: Never   Smokeless tobacco: Never  Vaping Use   Vaping Use: Never used  Substance and Sexual Activity   Alcohol use: No   Drug use: No   Sexual activity: Not on file  Other Topics Concern   Not on file  Social History Narrative   Not on file   Social Determinants of Health   Financial Resource Strain: Low Risk    Difficulty of Paying Living Expenses: Not hard at all  Food Insecurity: No Food Insecurity   Worried About Running Out of Food in the Last Year: Never  true   [place] in the Last Year: Never true  Transportation Needs: No Transportation Needs   Lack of Transportation (Medical): No   Lack of Transportation (Non-Medical): No  Physical Activity: Insufficiently Active   Days of Exercise per Week: 1 day   Minutes of Exercise per Session: 10 min  Stress: No Stress Concern Present   Feeling of Stress : Only a little  Social Connections: Unknown   Frequency of Communication with Friends and Family: More than three times a week   Frequency of Social Gatherings with Friends and Family: Patient refused   Attends Religious Services: Not on Electrical engineer or Organizations: No   Attends Archivist Meetings: Never   Marital Status: Married    Tobacco Counseling Counseling given: Not Answered   Clinical Intake:     Pain : 0-10 Pain Score: 4  Pain Type: Acute pain Pain Location: Abdomen Pain Orientation: Lower Pain Descriptors / Indicators: Pressure, Aching Pain Onset: Yesterday Pain Frequency: Occasional     Nutritional Risks: None Diabetes: No     Diabetic? no  Interpreter Needed?: No      Activities of Daily Living In your present state of health, do you have any difficulty performing the following activities: 04/18/2021 02/02/2021  Hearing? N N  Vision? Y N  Difficulty concentrating or making decisions? Y N  Comment short term -  Walking or climbing stairs? N N  Dressing or bathing? N N  Doing errands, shopping? Y N  Comment assistant with niece (Ginger) -  Conservation officer, nature and eating ? N -  Using the Toilet? N -  In the past six months, have you accidently leaked urine? N -  Do you have problems with loss of bowel control? Y -  Managing your Medications? N -  Managing your Finances? Y -  Comment pt wife manage the finance -  Housekeeping or managing your Housekeeping? Y -  Comment pt wife manage  the house keeping. -  Some recent data might be hidden    Patient Care  Team: Chrismon, Vickki Muff, PA-C as PCP - General (Family Medicine)  Indicate any recent Medical Services you may have received from other than [facility] providers in the past year (date may be approximate).     Assessment:   This is a routine wellness examination for [name].  Hearing/Vision screen Hearing Screening - Comments:: declined Vision Screening - Comments:: Declined  Dietary issues and exercise activities discussed: Current Exercise Habits: Home exercise routine, Time (Minutes): 10, Frequency (Times/Week): 1, Weekly Exercise (Minutes/Week): 10, Intensity: Mild   Goals Addressed   None    Depression Screen [facility] 2/9 Scores 04/18/2021 02/05/2020 06/20/2017  06/18/2016  PHQ - 2 Score 0 1 0 0  PHQ- 9 Score - 3 0 -    Fall Risk Fall Risk  04/18/2021 02/05/2020 06/20/2017 06/18/2016  Falls in the past year? 0 1 No No  Number falls in past yr: 0 1 - -  Injury with Fall? 0 0 - -  Risk for fall due to : - Impaired balance/gait - -  Follow up - Falls evaluation completed - -    FALL RISK PREVENTION PERTAINING TO THE HOME:  Any stairs in or around the home? Yes  If so, are there any without handrails? Yes  Home free of loose throw rugs in walkways, pet beds, electrical cords, etc? Yes  Adequate lighting in your home to reduce risk of falls? Yes   ASSISTIVE DEVICES UTILIZED TO PREVENT FALLS:  Life alert? No  Use of a cane, walker or w/c? Yes  Grab bars in the bathroom? No  Shower chair or bench in shower? Yes  Elevated toilet seat or a handicapped toilet? Yes   TIMED UP AND GO:  Was the test performed?  N/A .  Length of time to ambulate 10 feet: N/A sec.     Cognitive Function:     6CIT Screen 04/18/2021 02/05/2020  What Year? 4 points 0 points  What month? 3 points 0 points  What time? 0 points 0 points  Count back from 20 0 points 0 points  Months in reverse 2 points 2 points  Repeat phrase 8 points 2 points  Total Score 17 4    Immunizations Immunization History   Administered Date(s) Administered   Influenza, High Dose Seasonal PF 06/21/2019   PFIZER Comirnaty(Gray Top)Covid-19 Tri-Sucrose Vaccine 02/25/2021   PFIZER(Purple Top)SARS-COV-2 Vaccination 09/10/2019, 10/01/2019   Pneumococcal Conjugate-13 02/05/2020    TDAP status: Due, Education has been provided regarding the importance of this vaccine. Advised may receive this vaccine at local pharmacy or Health Dept. Aware to provide a copy of the vaccination record if obtained from local pharmacy or Health Dept. Verbalized acceptance and understanding.  Flu Vaccine status: Due, Education has been provided regarding the importance of this vaccine. Advised may receive this vaccine at local pharmacy or Health Dept. Aware to provide a copy of the vaccination record if obtained from local pharmacy or Health Dept. Verbalized acceptance and understanding.  Pneumococcal vaccine status: Up to date  Covid-19 vaccine status: Completed vaccines  Qualifies for Shingles Vaccine? No   Zostavax completed No   Shingrix Completed?: No.    Education has been provided regarding the importance of this vaccine. Patient has been advised to call insurance company to determine out of pocket expense if they have not yet received this vaccine. Advised may also receive vaccine at local pharmacy or Health Dept. Verbalized acceptance and understanding.  Screening Tests Health Maintenance  Topic Date Due   TETANUS/TDAP  Never done   Zoster Vaccines- Shingrix (1 of 2) Never done   PNA vac Low Risk Adult (2 of 2 - PPSV23) 02/04/2021   INFLUENZA VACCINE  03/09/2021   COVID-19 Vaccine (4 - Booster for Pfizer series) 06/28/2021   HPV VACCINES  Aged Out    Health Maintenance  Health Maintenance Due  Topic Date Due   TETANUS/TDAP  Never done   Zoster Vaccines- Shingrix (1 of 2) Never done   PNA vac Low Risk Adult (2 of 2 - PPSV23) 02/04/2021   INFLUENZA VACCINE  03/09/2021    Colorectal cancer screening: No longer  required.  Lung Cancer Screening: (Low Dose CT Chest recommended if Age 92-80 years, 30 pack-year currently smoking OR have quit w/in 15years.) does not qualify.   Lung Cancer Screening Referral: N/A  Additional Screening:  Hepatitis C Screening: does not qualify; Completed N/A  Vision Screening: Recommended annual ophthalmology exams for early detection of glaucoma and other disorders of the eye. Is the patient up to date with their annual eye exam?  No  Who is the provider or what is the name of the office in which the patient attends annual eye exams? 3 YEARS AGO If pt is not established with a provider, would they like to be referred to a provider to establish care? No .   Dental Screening: Recommended annual dental exams for proper oral hygiene  Community Resource Referral / Chronic Care Management: CRR required this visit?  No   CCM required this visit?  No      Plan:     I have personally reviewed and noted the following in the patient's chart:   Medical and social history Use of alcohol, tobacco or illicit drugs  Current medications and supplements including opioid prescriptions. Patient is not currently taking opioid prescriptions. Functional ability and status Nutritional status Physical activity Advanced directives List of other physicians Hospitalizations, surgeries, and ER visits in previous 12 months Vitals Screenings to include cognitive, depression, and falls Referrals and appointments  In addition, I have reviewed and discussed with patient certain preventive protocols, quality metrics, and best practice recommendations. A written personalized care plan for preventive services as well as general preventive health recommendations were provided to patient.     Elta Gu[place], [place]   04/18/2021   Nurse Notes: Non- face to face 50 minutes visit  Mr. Albert , Thank you for taking time to come for your Medicare Wellness Visit. I appreciate your ongoing  commitment to your health goals. Please review the following plan we discussed and let me know if I can assist you in the future.   These are the goals we discussed:  Goals   None     This is a list of the screening recommended for you and due dates:  Health Maintenance  Topic Date Due   Tetanus Vaccine  Never done   Zoster (Shingles) Vaccine (1 of 2) Never done   Pneumonia vaccines (2 of 2 - PPSV23) 02/04/2021   Flu Shot  03/09/2021   COVID-19 Vaccine (4 - Booster for Pfizer series) 06/28/2021   HPV Vaccine  Aged Out

## 2021-04-20 ENCOUNTER — Other Ambulatory Visit: Payer: Self-pay | Admitting: Family Medicine

## 2021-04-20 DIAGNOSIS — I10 Essential (primary) hypertension: Secondary | ICD-10-CM

## 2021-04-22 ENCOUNTER — Encounter: Payer: Self-pay | Admitting: Family Medicine

## 2021-04-22 ENCOUNTER — Telehealth (INDEPENDENT_AMBULATORY_CARE_PROVIDER_SITE_OTHER): Payer: Medicare Other | Admitting: Family Medicine

## 2021-04-22 DIAGNOSIS — I9589 Other hypotension: Secondary | ICD-10-CM | POA: Diagnosis not present

## 2021-04-22 DIAGNOSIS — N400 Enlarged prostate without lower urinary tract symptoms: Secondary | ICD-10-CM

## 2021-04-22 DIAGNOSIS — L89152 Pressure ulcer of sacral region, stage 2: Secondary | ICD-10-CM | POA: Diagnosis not present

## 2021-04-22 DIAGNOSIS — E861 Hypovolemia: Secondary | ICD-10-CM

## 2021-04-22 DIAGNOSIS — K851 Biliary acute pancreatitis without necrosis or infection: Secondary | ICD-10-CM | POA: Diagnosis not present

## 2021-04-22 MED ORDER — PANCRELIPASE (LIP-PROT-AMYL) 36000-114000 UNITS PO CPEP: 36000.0000 [IU] | ORAL_CAPSULE | Freq: Three times a day (TID) | ORAL | 1 refills | Status: DC

## 2021-04-22 NOTE — Patient Instructions (Signed)
Preventive Care 83 Years and Older, Male Preventive care refers to lifestyle choices and visits with your health care provider that can promote health and wellness. This includes: A yearly physical exam. This is also called an annual wellness visit. Regular dental and eye exams. Immunizations. Screening for certain conditions. Healthy lifestyle choices, such as: Eating a healthy diet. Getting regular exercise. Not using drugs or products that contain nicotine and tobacco. Limiting alcohol use. What can I expect for my preventive care visit? Physical exam Your health care provider will check your: Height and weight. These may be used to calculate your BMI (body mass index). BMI is a measurement that tells if you are at a healthy weight. Heart rate and blood pressure. Body temperature. Skin for abnormal spots. Counseling Your health care provider may ask you questions about your: Past medical problems. Family's medical history. Alcohol, tobacco, and drug use. Emotional well-being. Home life and relationship well-being. Sexual activity. Diet, exercise, and sleep habits. History of falls. Memory and ability to understand (cognition). Work and work Statistician. Access to firearms. What immunizations do I need? Vaccines are usually given at various ages, according to a schedule. Your health care provider will recommend vaccines for you based on your age, medical history, and lifestyle or other factors, such as travel or where you work. What tests do I need? Blood tests Lipid and cholesterol levels. These may be checked every 5 years, or more often depending on your overall health. Hepatitis C test. Hepatitis B test. Screening Lung cancer screening. You may have this screening every year starting at age 83 if you have a 30-pack-year history of smoking and currently smoke or have quit within the past 15 years. Colorectal cancer screening. All adults should have this screening  starting at age 83 and continuing until age 83. Your health care provider may recommend screening at age 83 if you are at increased risk. You will have tests every 1-10 years, depending on your results and the type of screening test. Prostate cancer screening. Recommendations will vary depending on your family history and other risks. Genital exam to check for testicular cancer or hernias. Diabetes screening. This is done by checking your blood sugar (glucose) after you have not eaten for a while (fasting). You may have this done every 1-3 years. Abdominal aortic aneurysm (AAA) screening. You may need this if you are a current or former smoker. STD (sexually transmitted disease) testing, if you are at risk. Follow these instructions at home: Eating and drinking  Eat a diet that includes fresh fruits and vegetables, whole grains, lean protein, and low-fat dairy products. Limit your intake of foods with high amounts of sugar, saturated fats, and salt. Take vitamin and mineral supplements as recommended by your health care provider. Do not drink alcohol if your health care provider tells you not to drink. If you drink alcohol: Limit how much you have to 0-2 drinks a day. Be aware of how much alcohol is in your drink. In the [place] one drink equals one 12 oz bottle of beer (355 mL), one 5 oz glass of wine (148 mL), or one 1 oz glass of hard liquor (44 mL). Lifestyle Take daily care of your teeth and gums. Brush your teeth every morning and night with fluoride toothpaste. Floss one time each day. Stay active. Exercise for at least 30 minutes 5 or more days each week. Do not use any products that contain nicotine or tobacco, such as cigarettes, e-cigarettes, and chewing tobacco. If  you need help quitting, ask your health care provider. Do not use drugs. If you are sexually active, practice safe sex. Use a condom or other form of protection to prevent STIs (sexually transmitted infections). Talk  with your health care provider about taking a low-dose aspirin or statin. Find healthy ways to cope with stress, such as: Meditation, yoga, or listening to music. Journaling. Talking to a trusted person. Spending time with friends and family. Safety Always wear your seat belt while driving or riding in a vehicle. Do not drive: If you have been drinking alcohol. Do not ride with someone who has been drinking. When you are tired or distracted. While texting. Wear a helmet and other protective equipment during sports activities. If you have firearms in your house, make sure you follow all gun safety procedures. What's next? Visit your health care provider once a year for an annual wellness visit. Ask your health care provider how often you should have your eyes and teeth checked. Stay up to date on all vaccines. This information is not intended to replace advice given to you by your health care provider. Make sure you discuss any questions you have with your health care provider. Document Revised: 10/03/2020 Document Reviewed: 07/20/2018 Elsevier Patient Education  2022 [employer].

## 2021-04-22 NOTE — Assessment & Plan Note (Signed)
Out of enzymes- needs to be refilled

## 2021-04-22 NOTE — Assessment & Plan Note (Signed)
Denies symptoms Continues to take flomax without concern

## 2021-04-22 NOTE — Progress Notes (Signed)
MyChart Video Visit    Virtual Visit via Video Note   This visit type was conducted due to national recommendations for restrictions regarding the COVID-19 Pandemic (e.g. social distancing) in an effort to limit this patient's exposure and mitigate transmission in our community. This patient is at least at moderate risk for complications without adequate follow up. This format is felt to be most appropriate for this patient at this time. Physical exam was limited by quality of the video and audio technology used for the visit.   Patient location: home Provider location: [facility]  I discussed the limitations of evaluation and management by telemedicine and the availability of in person appointments. The patient expressed understanding and agreed to proceed.  Patient: James Mora   DOB: [birth year]   85 y.o. Male  MRN: [id number] Visit Date: 04/22/2021  Today's healthcare provider: [name], [name]   Chief Complaint  Patient presents with   Hypertension   Follow-up   Subjective    HPI  Hypertension, follow-up  BP Readings from Last 3 Encounters:  03/31/21 93/64  03/24/21 (!) 77/51  02/27/21 (!) 144/76   Wt Readings from Last 3 Encounters:  03/24/21 166 lb (75.3 kg)  02/27/21 160 lb (72.6 kg)  02/01/21 162 lb 0.6 oz (73.5 kg)     He was last seen for hypertension 3 weeks ago.  BP at that visit was 128/87. Management since that visit includes restart carvedilol, hold enalapril for 1 month per apprehension from wife/niece.  He reports excellent compliance with treatment. He is not having side effects.  He is following a Regular diet. He is not exercising. He does not smoke.  Use of agents associated with hypertension: none.   Outside blood pressures are . Symptoms: No chest pain No chest pressure  No palpitations No syncope  No dyspnea No orthopnea  No paroxysmal nocturnal dyspnea No lower extremity edema   Pertinent labs: Lab Results  Component Value Date    CHOL 125 02/01/2021   HDL 49 02/01/2021   LDLCALC 62 02/01/2021   TRIG 68 02/01/2021   CHOLHDL 2.6 02/01/2021   Lab Results  Component Value Date   NA 142 03/24/2021   K 3.9 03/24/2021   CREATININE 1.32 (H) 03/24/2021   GFRNONAA >60 02/27/2021   GFRAA 46 (L) 02/05/2020   GLUCOSE 122 (H) 03/24/2021     The ASCVD Risk score (Arnett DK, et al., 2019) failed to calculate for the following reasons:   The 2019 ASCVD risk score is only valid for ages 83 to 78   The patient has a prior MI or stroke diagnosis   ---------------------------------------------------------------------------------------------------    Medications: Outpatient Medications Prior to Visit  Medication Sig   aspirin 81 MG tablet Take 81 mg by mouth daily.   atorvastatin (LIPITOR) 20 MG tablet TAKE 1 TABLET(20 MG) BY MOUTH DAILY AT 6 PM   bismuth subsalicylate (PEPTO BISMOL) 262 MG/15ML suspension Take 30 mLs by mouth every 6 (six) hours as needed (for nausea, and vomiting.).   carvedilol (COREG) 12.5 MG tablet TAKE 1 TABLET(12.5 MG) BY MOUTH TWICE DAILY   enalapril (VASOTEC) 20 MG tablet TAKE 1 TABLET(20 MG) BY MOUTH DAILY   ibuprofen (ADVIL) 200 MG tablet Take 200 mg by mouth every 6 (six) hours as needed for moderate pain.   nitroGLYCERIN (NITROSTAT) 0.4 MG SL tablet Place 0.4 mg under the tongue every 5 (five) minutes as needed.   omeprazole (PRILOSEC) 20 MG capsule TAKE 2 CAPSULES BY  MOUTH EVERY DAY   omeprazole (PRILOSEC) 20 MG capsule Take 20 mg by mouth once.   Pancrelipase, Lip-Prot-Amyl, (VIOKACE PO) Take by mouth.   tamsulosin (FLOMAX) 0.4 MG CAPS capsule TAKE 1 CAPSULE BY MOUTH EVERY DAY   Thiamine HCl (B-1) 100 MG TABS TAKE 1 TABLET BY MOUTH EVERY DAY   No facility-administered medications prior to visit.    Review of Systems     Objective    There were no vitals taken for this visit.   Physical Exam Constitutional:      Appearance: Normal appearance.  HENT:     Head: Normocephalic and  atraumatic.  Eyes:     Pupils: Pupils are equal, round, and reactive to light.  Pulmonary:     Effort: Pulmonary effort is normal.     Breath sounds: Normal breath sounds.  Musculoskeletal:        General: Normal range of motion.     Cervical back: Normal range of motion.  Skin:    Capillary Refill: Capillary refill takes less than 2 seconds.  Neurological:     General: No focal deficit present.     Mental Status: He is alert and oriented to person, place, and time. Mental status is at baseline.       Assessment & Plan     Problem List Items Addressed This Visit       Cardiovascular and Mediastinum   Hypotension due to hypovolemia - Primary    Seems to be resolving BP today 128/87 per home cuff Denies CP, SOB, LE Edema Plan to restart Carvedilol- got Rx filled earlier this week Wishes to wait to start Enalapril until 1 month f/u from restart of Carvedilol        Digestive   Gallstone pancreatitis    Out of enzymes- needs to be refilled      Relevant Medications   omeprazole (PRILOSEC) 20 MG capsule     Musculoskeletal and Integument   Pressure injury of sacral region, stage 2 ([place])    Completely healed per report from family        Genitourinary   Benign prostatic hyperplasia without lower urinary tract symptoms    Denies symptoms Continues to take flomax without concern        Return in about 4 weeks (around 05/20/2021) for blood pressure check.     I discussed the assessment and treatment plan with the patient. The patient was provided an opportunity to ask questions and all were answered. The patient agreed with the plan and demonstrated an understanding of the instructions.   The patient was advised to call back or seek an in-person evaluation if the symptoms worsen or if the condition fails to improve as anticipated.  I provided 16 minutes of non-face-to-face time during this encounter.  Vonna Kotyk, [name], have reviewed all documentation for  this visit. The documentation on 04/22/21 for the exam, diagnosis, procedures, and orders are all accurate and complete.   [name], [place] [phone] (phone) [phone] (fax)  [place]

## 2021-04-22 NOTE — Assessment & Plan Note (Signed)
Seems to be resolving BP today 128/87 per home cuff Denies CP, SOB, LE Edema Plan to restart Carvedilol- got Rx filled earlier this week Wishes to wait to start Enalapril until 1 month f/u from restart of Carvedilol

## 2021-04-22 NOTE — Assessment & Plan Note (Signed)
Completely healed per report from family

## 2021-05-15 ENCOUNTER — Telehealth: Payer: Self-pay

## 2021-05-15 NOTE — Telephone Encounter (Signed)
LMTCB 05/15/2021.  Okay for [facility] to schedule when they call back.   Thanks,   -Mickel Baas

## 2021-05-15 NOTE — Telephone Encounter (Signed)
Copied from [place] [phone]. Topic: Appointment Scheduling - Scheduling Inquiry for Clinic >> May 15, 2021 11:57 AM Tessa Lerner A wrote: Reason for CRM: Patient wife would like to be contacted to schedule an additional appt with the patient's PCP  Please contact when possible

## 2021-06-05 ENCOUNTER — Encounter: Payer: Self-pay | Admitting: Family Medicine

## 2021-06-05 ENCOUNTER — Ambulatory Visit (INDEPENDENT_AMBULATORY_CARE_PROVIDER_SITE_OTHER): Payer: Medicare Other | Admitting: Family Medicine

## 2021-06-05 ENCOUNTER — Other Ambulatory Visit: Payer: Self-pay

## 2021-06-05 ENCOUNTER — Telehealth: Payer: Self-pay | Admitting: *Deleted

## 2021-06-05 VITALS — BP 141/75 | HR 74 | Temp 97.0°F | Resp 15 | Wt 175.1 lb

## 2021-06-05 DIAGNOSIS — G4700 Insomnia, unspecified: Secondary | ICD-10-CM | POA: Diagnosis not present

## 2021-06-05 DIAGNOSIS — K851 Biliary acute pancreatitis without necrosis or infection: Secondary | ICD-10-CM

## 2021-06-05 DIAGNOSIS — R6889 Other general symptoms and signs: Secondary | ICD-10-CM | POA: Insufficient documentation

## 2021-06-05 DIAGNOSIS — Z23 Encounter for immunization: Secondary | ICD-10-CM | POA: Diagnosis not present

## 2021-06-05 DIAGNOSIS — I10 Essential (primary) hypertension: Secondary | ICD-10-CM

## 2021-06-05 MED ORDER — AMLODIPINE BESYLATE 2.5 MG PO TABS: 2.5000 mg | ORAL_TABLET | Freq: Every day | ORAL | 0 refills | Status: DC

## 2021-06-05 MED ORDER — TRAZODONE HCL 50 MG PO TABS: 25.0000 mg | ORAL_TABLET | Freq: Every evening | ORAL | 3 refills | Status: DC | PRN

## 2021-06-05 NOTE — Assessment & Plan Note (Signed)
Report of exercise intolerance- can no longer go inside the grocery store and shop but walks every hour around the home

## 2021-06-05 NOTE — Progress Notes (Signed)
Established patient visit   Patient: James Mora   DOB: 1935/12/27   85 y.o. Male  MRN: [id number] Visit Date: 06/05/2021  Today's healthcare provider: [name], [name]   Chief Complaint  Patient presents with   Hypotension   Subjective    HPI  Follow up for Hypotension due to hypovolemia  The patient was last seen for this 1 months ago. Changes made at last visit include restart Carvedilol.  He reports excellent compliance with treatment. He feels that condition is Improved. He is not having side effects.   Patient reports that blood pressure readings at home have returned back to normal range with systolic <361 and diastolic of 44-31. Patient denies chest pain/pressure he reports dyspnea on exertion and swelling in lower extremities.  -----------------------------------------------------------------------------------------   Medications: Outpatient Medications Prior to Visit  Medication Sig   aspirin 81 MG tablet Take 81 mg by mouth daily.   atorvastatin (LIPITOR) 20 MG tablet TAKE 1 TABLET(20 MG) BY MOUTH DAILY AT 6 PM   bismuth subsalicylate (PEPTO BISMOL) 262 MG/15ML suspension Take 30 mLs by mouth every 6 (six) hours as needed (for nausea, and vomiting.).   carvedilol (COREG) 12.5 MG tablet TAKE 1 TABLET(12.5 MG) BY MOUTH TWICE DAILY   enalapril (VASOTEC) 20 MG tablet TAKE 1 TABLET(20 MG) BY MOUTH DAILY   ibuprofen (ADVIL) 200 MG tablet Take 200 mg by mouth every 6 (six) hours as needed for moderate pain.   lipase/protease/amylase (CREON) 36000 UNITS CPEP capsule Take 1 capsule (36,000 Units total) by mouth 3 (three) times daily with meals.   nitroGLYCERIN (NITROSTAT) 0.4 MG SL tablet Place 0.4 mg under the tongue every 5 (five) minutes as needed.   omeprazole (PRILOSEC) 20 MG capsule TAKE 2 CAPSULES BY MOUTH EVERY DAY   omeprazole (PRILOSEC) 20 MG capsule Take 20 mg by mouth once.   tamsulosin (FLOMAX) 0.4 MG CAPS capsule TAKE 1 CAPSULE BY MOUTH EVERY DAY    Thiamine HCl (B-1) 100 MG TABS TAKE 1 TABLET BY MOUTH EVERY DAY   No facility-administered medications prior to visit.    Review of Systems     Objective    BP (!) 141/75   Pulse 74   Temp (!) 97 F (36.1 C) (Temporal)   Resp 15   Wt 175 lb 1.6 oz (79.4 kg)   SpO2 96%   BMI 25.86 kg/m    Physical Exam Vitals and nursing note reviewed.  Constitutional:      Appearance: Normal appearance. He is overweight.  HENT:     Head: Normocephalic and atraumatic.  Eyes:     Pupils: Pupils are equal, round, and reactive to light.  Neck:     Vascular: JVD present.  Cardiovascular:     Rate and Rhythm: Normal rate and regular rhythm.     Pulses: Normal pulses.     Heart sounds: Normal heart sounds.  Pulmonary:     Effort: Pulmonary effort is normal.     Breath sounds: Normal breath sounds.  Musculoskeletal:        General: Normal range of motion.     Cervical back: Normal range of motion.     Right lower leg: Edema present.     Left lower leg: Edema present.  Skin:    General: Skin is warm and dry.     Capillary Refill: Capillary refill takes less than 2 seconds.  Neurological:     General: No focal deficit present.  Mental Status: He is alert and oriented to person, place, and time. Mental status is at baseline.      No results found for any visits on 06/05/21.  Assessment & Plan     Problem List Items Addressed This Visit       Cardiovascular and Mediastinum   Essential hypertension - Primary    Elevated to 140s today LE edema on exam today +JVD today Recommend restart on norvasc       Relevant Medications   amLODipine (NORVASC) 2.5 MG tablet     Digestive   Gallstone pancreatitis    Form filled out for creon medication assistance Referral to CCM for pharmacy assistance  Additional referral placed given poor communication with [facility] for follow up for upcoming removal      Relevant Orders   AMB Referral to [facility]   Ambulatory  referral to Gastroenterology     Other   Insomnia    Report of daytime naps; recommend elimination if not decrease time/frequency Trial of trazodone Recommend increased activity during the day to assist with fatigue/night time sleepiness      Relevant Medications   traZODone (DESYREL) 50 MG tablet   Exercise intolerance    Report of exercise intolerance- can no longer go inside the grocery store and shop but walks every hour around the home        Return in about 3 months (around 09/05/2021) for blood pressure check, chonic disease management.      Vonna Kotyk, [name], have reviewed all documentation for this visit. The documentation on 06/05/21 for the exam, diagnosis, procedures, and orders are all accurate and complete.    [name], [place] [phone] (phone) [phone] (fax)  [place]

## 2021-06-05 NOTE — Assessment & Plan Note (Signed)
Form filled out for creon medication assistance Referral to CCM for pharmacy assistance  Additional referral placed given poor communication with [facility] for follow up for upcoming removal

## 2021-06-05 NOTE — Assessment & Plan Note (Signed)
Elevated to 140s today LE edema on exam today +JVD today Recommend restart on norvasc

## 2021-06-05 NOTE — Chronic Care Management (AMB) (Signed)
  Chronic Care Management   Outreach Note  06/05/2021 Name: James Mora MRN: [id number] DOB: 1935/08/18  James Mora is a 85 y.o. year old male who is a primary care patient of [name], [name]. I reached out to [place] by phone today in response to a referral sent by Mr. James Mora primary care provider.  An unsuccessful telephone outreach was attempted today. The patient was referred to the case management team for assistance with care management and care coordination.   Follow Up Plan: A HIPAA compliant phone message was left for the patient providing contact information and requesting a return call. The care management team will reach out to the patient again over the next 7 days.  If patient returns call to provider office, please advise to call [place] at [phone].  [place] Management  Direct Dial: [phone]

## 2021-06-05 NOTE — Assessment & Plan Note (Signed)
Report of daytime naps; recommend elimination if not decrease time/frequency Trial of trazodone Recommend increased activity during the day to assist with fatigue/night time sleepiness

## 2021-06-08 NOTE — Chronic Care Management (AMB) (Signed)
  Chronic Care Management   Note  06/08/2021 Name: James Mora MRN: [id number] DOB: 02-01-1936  James Mora is a 85 y.o. year old male who is a primary care patient of [name], [name]. I reached out to [place] by phone today in response to a referral sent by James Mora.  [name] was given information about Chronic Care Management services today including:  CCM service includes personalized support from designated clinical staff supervised by his physician, including individualized plan of care and coordination with other care providers 24/7 contact phone numbers for assistance for urgent and routine care needs. Service will only be billed when office clinical staff spend 20 minutes or more in a month to coordinate care. Only one practitioner may furnish and bill the service in a calendar month. The patient may stop CCM services at any time (effective at the end of the month) by phone call to the office staff. The patient is responsible for co-pay (up to 20% after annual deductible is met) if co-pay is required by the individual health plan.   Patient agreed to services and verbal consent obtained.   Follow up plan: Telephone appointment with care management team member scheduled for:06/16/21  [facility] Management  Direct Dial: [phone]

## 2021-06-11 ENCOUNTER — Telehealth: Payer: Self-pay

## 2021-06-11 NOTE — Progress Notes (Signed)
Chronic Care Management Pharmacy Assistant   Name: James Mora  MRN: [id number] DOB: Sep 03, 1935  Chart Review for clinical pharmacist on 06/16/2021 at 3:45 pm.  Conditions to be addressed/monitored: CAD, HTN, HLD, and Aortic Valve disorder, Acid reflux, Gallstone Pancreatitis, Benign prostatic hyperplasia without lower urinary tract symptoms,Insomnia  Primary concerns for visit include: None ID   Recent office visits:  06/05/2021 [name] (PCP) restart amlodipine 2.5 mg daily,start trazodone 50 mg  AMB Referral to [place], Ambulatory referral to Gastroenterology, Return in about 3 months  04/22/2021 [name] (PCP) Change Pancrelipase to 36,000 units 3 time daily,Return in about 4 weeks  04/18/2021 [name] CMA (PCP Office) Medicare Wellness completed, No medication changes noted 03/31/2021 [name] (PCP) stop Chlorthalidone, stop Lisinopril, stop Silodosin 03/24/2021 [name] (PCP) stop amlodipine,stop Carvedilol,stop Enalapril, stop Loperamide,Return in about 2 weeks  01/01/2021 [name] PA-C (PCP office)sulfamethoxazole-trimethoprim (BACTRIM DS) 800-160 MG - Take 1 tablet by mouth 2 (two) times daily  Recent consult visits:  04/01/2021 Dr.Montgomery MD (Trauma Surgery) Iopamidol 61 % injection given,No Medication Changes noted, 01/14/2021 Dr.Stoioff MD (Urology) stop ciprofloxacin, stop sulfamethoxazole-trimethoprim, start Silodosin 8 mg daily 12/18/2020 Dr.Arida MD (Cardiology) No medication Changes noted, follow up in 12 months  Hospital visits:  Medication Reconciliation was completed by comparing discharge summary, patient's EMR and Pharmacy list, and upon discussion with patient.  Admitted to the hospital on 02/27/2021 due to Shortness of breath. Discharge date was 02/27/2021. Discharged from [place]?Medications Started at [facility] Discharge:?? -started None  Medication Changes at  Hospital Discharge: -Changed None  Medications Discontinued at Hospital Discharge: -Stopped None  Admitted to the hospital on 02/10/2021 due to Aortic Valve disorder. Discharge date was 02/25/2021. Discharged from [place]?Medications Started at [facility] Discharge:?? -Lipase-protease-amylase (viokace) three times daily  - Terazosin (hytrin) 1 mg nightly  - Thiamine 100 mg daily   Medication Changes at Hospital Discharge: -Changed Coreg 12.5 mg to 3.125 mg every 12 hours - Changed silodosin to tamsulosin 0.4 mg daily  - Changed enalapril to lisinopril 40 mg daily   Medications Discontinued at Hospital Discharge: -Discontinued home chlorthalidone  Medications that remain the same after Hospital Discharge:??  -All other medications will remain the same.    Admitted to the hospital on 02/01/2021 due to Acute Pancreatitis. Discharge date was 02/10/2021. Discharged from [place]?Medications Started at [facility] Discharge:?? -started None  Medication Changes at Hospital Discharge: -Changed None  Medications Discontinued at Hospital Discharge: -Stopped None  Medications that remain the same after Hospital Discharge:??  -All other medications will remain the same.    Left Voice message to do initial question prior to patient appointment on 06/16/2021 for CCM at 3:45 pm  with Junius Argyle the Clinical pharmacist.   Medications: Outpatient Encounter Medications as of 06/11/2021  Medication Sig   amLODipine (NORVASC) 2.5 MG tablet Take 1 tablet (2.5 mg total) by mouth daily.   aspirin 81 MG tablet Take 81 mg by mouth daily.   atorvastatin (LIPITOR) 20 MG tablet TAKE 1 TABLET(20 MG) BY MOUTH DAILY AT 6 PM   bismuth subsalicylate (PEPTO BISMOL) 262 MG/15ML suspension Take 30 mLs by mouth every 6 (six) hours as needed (for nausea, and vomiting.).   carvedilol (COREG) 12.5 MG tablet TAKE 1 TABLET(12.5 MG) BY MOUTH TWICE DAILY   enalapril  (VASOTEC) 20 MG tablet TAKE 1 TABLET(20 MG) BY MOUTH  DAILY   ibuprofen (ADVIL) 200 MG tablet Take 200 mg by mouth every 6 (six) hours as needed for moderate pain.   lipase/protease/amylase (CREON) 36000 UNITS CPEP capsule Take 1 capsule (36,000 Units total) by mouth 3 (three) times daily with meals.   nitroGLYCERIN (NITROSTAT) 0.4 MG SL tablet Place 0.4 mg under the tongue every 5 (five) minutes as needed.   omeprazole (PRILOSEC) 20 MG capsule TAKE 2 CAPSULES BY MOUTH EVERY DAY   omeprazole (PRILOSEC) 20 MG capsule Take 20 mg by mouth once.   tamsulosin (FLOMAX) 0.4 MG CAPS capsule TAKE 1 CAPSULE BY MOUTH EVERY DAY   Thiamine HCl (B-1) 100 MG TABS TAKE 1 TABLET BY MOUTH EVERY DAY   traZODone (DESYREL) 50 MG tablet Take 0.5-1 tablets (25-50 mg total) by mouth at bedtime as needed for sleep.   [DISCONTINUED] lovastatin (ALTOPREV) 40 MG 24 hr tablet Take 80 mg by mouth at bedtime.     [DISCONTINUED] simvastatin (ZOCOR) 40 MG tablet Take 40 mg by mouth at bedtime.     No facility-administered encounter medications on file as of 06/11/2021.    Care Gaps: Tetanus Vaccine Shingrix Vaccine Pneumonia Vaccine (Last Completed 02/05/2020) Influenza Vaccine Covid-19 Vaccine (4- Booster for Pfizer Series) HTN: 141/75 on 06/05/2021 Star Rating Drugs: Enalapril 20 mg last filled 04/20/2021 90 day supply at [facility]. Atorvastatin 20 mg  last filled 04/21/2021 90 day supply at [facility]. Medication Fill Gaps: None  Web designer [phone]

## 2021-06-16 ENCOUNTER — Ambulatory Visit (INDEPENDENT_AMBULATORY_CARE_PROVIDER_SITE_OTHER): Payer: Medicare Other

## 2021-06-16 DIAGNOSIS — I10 Essential (primary) hypertension: Secondary | ICD-10-CM

## 2021-06-16 DIAGNOSIS — G4700 Insomnia, unspecified: Secondary | ICD-10-CM

## 2021-06-16 DIAGNOSIS — E7800 Pure hypercholesterolemia, unspecified: Secondary | ICD-10-CM

## 2021-06-16 NOTE — Progress Notes (Signed)
Chronic Care Management Pharmacy Note  06/18/2021 Name:  James Mora MRN:  [id number] DOB:  Apr 21, 1936  Summary: Patient presents for initial CCM consult. Today's visit was conducted 80% with James Mora, the patient's spouse and 20% with the patient. The patient has some affordability concerns with his Creon. Patient has has poor sleep quality recently and has found trazodone ineffective. Patient wife feels his poor sleep is due to underlying issues of anxiety or chronic pain that are not being addressed.   Recommendations/Changes made from today's visit: Continue current medications START PAP for Creon  Plan: CPP follow-up 3 months  Subjective: James Mora is an 85 y.o. year old male who is a primary patient of James Sprout, Mora.  The CCM team was consulted for assistance with disease management and care coordination needs.    Engaged with patient by telephone for initial visit in response to provider referral for pharmacy case management and/or care coordination services.   Consent to Services:  The patient was given the following information about Chronic Care Management services today, agreed to services, and gave verbal consent: 1. CCM service includes personalized support from designated clinical staff supervised by the primary care provider, including individualized plan of care and coordination with other care providers 2. 24/7 contact phone numbers for assistance for urgent and routine care needs. 3. Service will only be billed when office clinical staff spend 20 minutes or more in a month to coordinate care. 4. Only one practitioner may furnish and bill the service in a calendar month. 5.The patient may stop CCM services at any time (effective at the end of the month) by phone call to the office staff. 6. The patient will be responsible for cost sharing (co-pay) of up to 20% of the service fee (after annual deductible is met). Patient agreed to services and consent  obtained.  Patient Care Team: James Sprout, Mora as PCP - General (Family Medicine) Germaine Pomfret, [facility] (Pharmacist)  Recent office visits: 06/05/2021 James Mora (PCP) restart amlodipine 2.5 mg daily,start trazodone 50 mg  AMB Referral to [place], Ambulatory referral to Gastroenterology, Return in about 3 months  04/22/2021 James Mora (PCP) Change Pancrelipase to 36,000 units 3 time daily,Return in about 4 weeks  04/18/2021 James Mora CMA (PCP Office) Medicare Wellness completed, No medication changes noted 03/31/2021 James Mora (PCP) stop Chlorthalidone, stop Lisinopril, stop Silodosin 03/24/2021 James Mora (PCP) stop amlodipine,stop Carvedilol,stop Enalapril, stop Loperamide,Return in about 2 weeks  01/01/2021 James Huh Chrismon PA-C (PCP office)sulfamethoxazole-trimethoprim (BACTRIM DS) 800-160 MG - Take 1 tablet by mouth 2 (two) times daily  Recent consult visits: 04/01/2021 Dr.Montgomery MD (Trauma Surgery) Iopamidol 61 % injection given,No Medication Changes noted, 01/14/2021 Dr.Stoioff MD (Urology) stop ciprofloxacin, stop sulfamethoxazole-trimethoprim, start Silodosin 8 mg daily 12/18/2020 Dr.Arida MD (Cardiology) No medication Changes noted, follow up in 12 months  Hospital visits: Admitted to the hospital on 02/27/2021 due to Shortness of breath. Discharge date was 02/27/2021. Discharged from [place]?Medications Started at [facility] Discharge:?? -started None   Medication Changes at Hospital Discharge: -Changed None   Medications Discontinued at Hospital Discharge: -Stopped None   Admitted to the hospital on 02/10/2021 due to Aortic Valve disorder. Discharge date was 02/25/2021. Discharged from [place]?Medications Started at [facility] Discharge:?? -Lipase-protease-amylase (viokace) three times daily  - Terazosin (hytrin) 1 mg nightly  - Thiamine 100 mg daily    Medication  Changes at Hospital Discharge: -Changed Coreg 12.5 mg to 3.125 mg every 12 hours - Changed silodosin to tamsulosin 0.4 mg daily  - Changed enalapril to lisinopril 40 mg daily    Medications Discontinued at Hospital Discharge: -Discontinued home chlorthalidone   Medications that remain the same after Hospital Discharge:??  -All other medications will remain the same.     Admitted to the hospital on 02/01/2021 due to Acute Pancreatitis. Discharge date was 02/10/2021. Discharged from [place]?Medications Started at [facility] Discharge:?? -started None   Medication Changes at Hospital Discharge: -Changed None   Medications Discontinued at Hospital Discharge: -Stopped None   Objective:  Lab Results  Component Value Date   CREATININE 1.32 (H) 03/24/2021   BUN 23 03/24/2021   GFRNONAA >60 02/27/2021   GFRAA 46 (L) 02/05/2020   NA 142 03/24/2021   K 3.9 03/24/2021   CALCIUM 8.7 03/24/2021   CO2 24 03/24/2021   GLUCOSE 122 (H) 03/24/2021    No results found for: HGBA1C, FRUCTOSAMINE, GFR, MICROALBUR  Last diabetic Eye exam: No results found for: HMDIABEYEEXA  Last diabetic Foot exam: No results found for: HMDIABFOOTEX   Lab Results  Component Value Date   CHOL 125 02/01/2021   HDL 49 02/01/2021   LDLCALC 62 02/01/2021   TRIG 68 02/01/2021   CHOLHDL 2.6 02/01/2021    Hepatic Function Latest Ref Rng & Units 03/24/2021 02/27/2021 02/10/2021  Total Protein 6.0 - 8.5 g/dL 6.0 6.2(L) 4.9(L)  Albumin 3.6 - 4.6 g/dL 2.9(L) 2.4(L) 1.8(L)  AST 0 - 40 IU/L 14 30 55(H)  ALT 0 - 44 IU/L '9 25 22  ' Alk Phosphatase 44 - 121 IU/L 111 74 71  Total Bilirubin 0.0 - 1.2 mg/dL 0.5 1.1 5.1(H)  Bilirubin, Direct 0.0 - 0.2 mg/dL - 0.3(H) -    Lab Results  Component Value Date/Time   TSH 5.170 (H) 01/02/2021 08:09 AM   TSH 3.33 06/20/2017 09:59 AM    CBC Latest Ref Rng & Units 03/24/2021 02/27/2021 02/10/2021  WBC 3.4 - 10.8 x10E3/uL 5.9 6.5 15.0(H)  Hemoglobin 13.0  - 17.7 g/dL 9.5(L) 9.4(L) 10.2(L)  Hematocrit 37.5 - 51.0 % 29.8(L) 29.5(L) 30.2(L)  Platelets 150 - 450 x10E3/uL 224 243 243    No results found for: VD25OH  Clinical ASCVD: Yes  The ASCVD Risk score (Arnett DK, et al., 2019) failed to calculate for the following reasons:   The 2019 ASCVD risk score is only valid for ages 78 to 106   The patient has a prior MI or stroke diagnosis    Depression screen [facility] 2/9 04/18/2021 02/05/2020 06/20/2017  Decreased Interest 0 1 0  Down, Depressed, Hopeless 0 0 0  PHQ - 2 Score 0 1 0  Altered sleeping - 1 0  Tired, decreased energy - 1 0  Change in appetite - 0 0  Feeling bad or failure about yourself  - 0 0  Trouble concentrating - 0 0  Moving slowly or fidgety/restless - 0 0  Suicidal thoughts - 0 0  PHQ-9 Score - 3 0  Difficult doing work/chores - Not difficult at all Not difficult at all    Social History   Tobacco Use  Smoking Status Never  Smokeless Tobacco Never   BP Readings from Last 3 Encounters:  06/05/21 (!) 141/75  03/31/21 93/64  03/24/21 (!) 77/51   Pulse Readings from Last 3 Encounters:  06/05/21 74  03/31/21 (!) 101  03/24/21 74   Wt  Readings from Last 3 Encounters:  06/05/21 175 lb 1.6 oz (79.4 kg)  03/24/21 166 lb (75.3 kg)  02/27/21 160 lb (72.6 kg)   BMI Readings from Last 3 Encounters:  06/05/21 25.86 kg/m  03/24/21 24.51 kg/m  02/27/21 23.63 kg/m    Assessment/Interventions: Review of patient past medical history, allergies, medications, health status, including review of consultants reports, laboratory and other test data, was performed as part of comprehensive evaluation and provision of chronic care management services.   SDOH:  (Social Determinants of Health) assessments and interventions performed: Yes SDOH Interventions    Flowsheet Row Most Recent Value  SDOH Interventions   Financial Strain Interventions Other (Comment)  [PAP]      SDOH Screenings   Alcohol Screen: Low Risk    Last  Alcohol Screening Score (AUDIT): 0  Depression (PHQ2-9): Low Risk    PHQ-2 Score: 0  Financial Resource Strain: High Risk   Difficulty of Paying Living Expenses: Hard  Food Insecurity: No Food Insecurity   Worried About Charity fundraiser in the Last Year: Never true   Ran Out of Food in the Last Year: Never true  Housing: Low Risk    Last Housing Risk Score: 0  Physical Activity: Insufficiently Active   Days of Exercise per Week: 1 day   Minutes of Exercise per Session: 10 min  Social Connections: Unknown   Frequency of Communication with Friends and Family: More than three times a week   Frequency of Social Gatherings with Friends and Family: Patient refused   Attends Religious Services: Not on Higher education careers adviser of Clubs or Organizations: No   Attends Archivist Meetings: Never   Marital Status: Married  Stress: No Stress Concern Present   Feeling of Stress : Only a little  Tobacco Use: Low Risk    Smoking Tobacco Use: Never   Smokeless Tobacco Use: Never   Passive Exposure: Not on file  Transportation Needs: No Transportation Needs   Lack of Transportation (Medical): No   Lack of Transportation (Non-Medical): No    CCM Care Plan  Allergies  Allergen Reactions   Imodium [Loperamide] Nausea And Vomiting   Hydroxyzine Hcl     Other reaction(s): Other (See Comments) Sedation and myoclonic jerks of all four extremities. Noted 02/19/21.     Medications Reviewed Today     Reviewed by Germaine Pomfret, [facility] (Pharmacist) on 06/16/21 at 1435  Med List Status: <None>   Medication Order Taking? Sig Documenting Provider Last Dose Status Informant  acetaminophen (TYLENOL) 500 MG tablet [id number] Yes Take 500 mg by mouth every 6 (six) hours as needed. [provider] Taking Active   amLODipine (NORVASC) 2.5 MG tablet [id number] Yes Take 1 tablet (2.5 mg total) by mouth daily. James Sprout, Mora Taking Active   aspirin 81 MG tablet [id number] Yes Take 81 mg  by mouth daily. [provider] Taking Active Self  atorvastatin (LIPITOR) 20 MG tablet [id number] Yes TAKE 1 TABLET(20 MG) BY MOUTH DAILY AT 6 PM Mora, Vickki Muff, PA-C Taking Active   bismuth subsalicylate (PEPTO BISMOL) 262 MG/15ML suspension [id number] Yes Take 30 mLs by mouth every 6 (six) hours as needed (for nausea, and vomiting.). [provider] Taking Active Self  calcium carbonate (TUMS - DOSED IN MG ELEMENTAL CALCIUM) 500 MG chewable tablet [id number] Yes Chew 1 tablet by mouth 2 (two) times daily as needed for indigestion or heartburn. [provider]  Active   carvedilol (COREG) 12.5  MG tablet [id number] Yes TAKE 1 TABLET(12.5 MG) BY MOUTH TWICE DAILY Mora, Vickki Muff, PA-C Taking Active   lipase/protease/amylase (CREON) 36000 UNITS CPEP capsule [id number]  Take 1 capsule (36,000 Units total) by mouth 3 (three) times daily with meals. James Joe T, Mora  Active     Discontinued 10/15/11 1520 (Change in therapy)   nitroGLYCERIN (NITROSTAT) 0.4 MG SL tablet [id number]  Place 0.4 mg under the tongue every 5 (five) minutes as needed. [provider]  Active Self  omeprazole (PRILOSEC) 20 MG capsule [id number] Yes TAKE 2 CAPSULES BY MOUTH EVERY DAY Mora, Vickki Muff, PA-C Taking Active Self  Discontinued 10/20/11 1527 (Change in therapy)   tamsulosin (FLOMAX) 0.4 MG CAPS capsule [id number] Yes TAKE 1 CAPSULE BY MOUTH EVERY DAY Mora, Vickki Muff, PA-C Taking Active   Thiamine HCl (B-1) 100 MG TABS [id number] Yes TAKE 1 TABLET BY MOUTH EVERY DAY Mora, Vickki Muff, PA-C Taking Active   traZODone (DESYREL) 50 MG tablet [id number] No Take 0.5-1 tablets (25-50 mg total) by mouth at bedtime as needed for sleep.  Patient not taking: Reported on 06/16/2021   James Sprout, Mora Not Taking Active             Patient Active Problem List   Diagnosis Date Noted   Insomnia 06/05/2021   Exercise intolerance 06/05/2021   Poor appetite 03/31/2021   Constipation  03/31/2021   Hypotension due to hypovolemia 03/24/2021   Dehydration 03/24/2021   Pressure injury of sacral region, stage 2 ([place]) 03/24/2021   Gallstone pancreatitis    Shortness of breath    Gallstones    Enteritis    Acute pancreatitis 02/01/2021   Narrowing of intervertebral disc space 06/18/2016   Elevated prostate specific antigen (PSA) 06/18/2016   Benign prostatic hyperplasia without lower urinary tract symptoms 06/18/2016   HLD (hyperlipidemia)    Mixed hyperlipidemia    CAD (coronary artery disease)    Coronary artery disease involving native coronary artery of native heart with angina pectoris with documented spasm (HCC)    NSTEMI (non-ST elevated myocardial infarction) ([place]) 03/19/2016   HTN (hypertension)    High blood cholesterol level    Acid reflux    Hyperlipidemia 12/27/2008   Essential hypertension 12/27/2008   Coronary atherosclerosis 12/27/2008   Aortic valve disorder 12/27/2008   Esophagitis, reflux 06/06/2006   Hypertensive heart disease 08/10/2003    Immunization History  Administered Date(s) Administered   Influenza, High Dose Seasonal PF 06/21/2019   PFIZER(Purple Top)SARS-COV-2 Vaccination 09/10/2019, 10/01/2019   Pneumococcal Conjugate-13 02/05/2020    Conditions to be addressed/monitored:  Hypertension, Hyperlipidemia, Coronary Artery Disease, GERD, BPH, and History of NSTEMI  Care Plan : General Pharmacy (Adult)  Updates made by Germaine Pomfret, [place] since 06/18/2021 12:00 AM     Problem: Hypertension, Hyperlipidemia, Coronary Artery Disease, GERD, BPH, and History of NSTEMI   Priority: High     Long-Range Goal: Patient-Specific Goal   Start Date: 06/18/2021  Expected End Date: 06/18/2022  This Visit's Progress: On track  Priority: High  Note:   Current Barriers:  Unable to independently afford treatment regimen  Pharmacist Clinical Goal(s):  Patient will maintain control of blood pressure as evidenced by BP less than 140/90   through collaboration with PharmD and provider.   Interventions: 1:1 collaboration with James Sprout, Mora regarding development and update of comprehensive plan of care as evidenced by provider attestation and co-signature Inter-disciplinary care team collaboration (see longitudinal plan of care) Comprehensive medication review performed;  medication list updated in electronic medical record  Hypertension (BP goal <140/90) -Controlled -Current treatment: Amlodipine 2.5 mg daily  Carvedilol 12.5 mg twice daily  -Medications previously tried: NA  -does report swelling in feet if not elevating them throughout the day -Current home readings: 126/74 -Denies hypotensive/hypertensive symptoms -Recommended to continue current medication  Hyperlipidemia: (LDL goal < 70) -Controlled -History of NSTEMI x3 (2000) -Current treatment: Atorvastatin 20 mg daily  -Medications previously tried: NA  -Recommended to continue current medication  Reflux (Goal: Minimize symptoms) -Controlled -Current treatment  Omeprazole 2 capsules daily before breakfast -Medications previously tried: NA  -Recommended to continue current medication  BPH (Goal: Minimize urinary symptoms) -Controlled -Current treatment  Tamsulosin 0.4 mg daily (HS)  -Medications previously tried: NA  -Recommended to continue current medication  Sleep (Goal: Improve sleep quality) -Uncontrolled -Current treatment  Trazodone 50 mg 0.5-1 tablets nightly as needed  -Medications previously tried: NA  -No benefit noted from trazodone  -Notices behavioral disturbances at nighttime. Patient wife thinks poor sleep is related to anxiety symptoms or pain rather than insomnia  -Recommend retrying trazodone, if ineffective can try melatonin 1 mg nightly for one month.  -Patient would likely benefit from work-up of anxiety symptoms.   Patient Goals/Self-Care Activities Patient will:  - check blood pressure 2-3 times weekly, document,  and provide at future appointments  Follow Up Plan: Telephone follow up appointment with care management team member scheduled for:  09/14/2021 at 3:00 PM      Medication Assistance: Application for Creon  medication assistance program. in process.  Anticipated assistance start date TBD.  See plan of care for additional detail.  Compliance/Adherence/Medication fill history: Care Gaps: Tetanus Vaccine Shingrix Vaccine Pneumonia Vaccine (Last Completed 02/05/2020) Influenza Vaccine Covid-19 Vaccine (4- Booster for Pfizer Series) HTN: 141/75 on 06/05/2021  Star-Rating Drugs: Enalapril 20 mg last filled 04/20/2021 90 day supply at [facility]. Atorvastatin 20 mg  last filled 04/21/2021 90 day supply at [facility].  Patient's preferred pharmacy is:  [facility] [place], [place] - [place] [address] [place] [address] Phone: [phone] Fax: [phone]  Uses pill box? No - too much work to set up Pt endorses 100% compliance  We discussed: Current pharmacy is preferred with insurance plan and patient is satisfied with pharmacy services Patient decided to: Continue current medication management strategy  Care Plan and Follow Up Patient Decision:  Patient agrees to Care Plan and Follow-up.  Plan: Telephone follow up appointment with care management team member scheduled for:  09/14/2021 at 3:00 PM  Junius Argyle, PharmD, [place], [place] [phone]

## 2021-06-17 ENCOUNTER — Telehealth: Payer: Self-pay

## 2021-06-17 NOTE — Progress Notes (Signed)
    Chronic Care Management Pharmacy Assistant   Name: James Mora  MRN: [id number] DOB: [birth year]  Patient Assistance Application     I received a task from Junius Argyle, CPP requesting that I start an application for patient assistance for the patient's Creon.   Application started with [employer] for Creon, and the application will be mailed to the patient's home for him to complete his portion of the application. Once the patient has completed the application he is instructed to return it to [facility] for CPP to fax it to [facility] @ [phone].  Application has been e-mailed to CPP/PTM for mailing to the patient's home address.  Medications: Outpatient Encounter Medications as of 06/17/2021  Medication Sig   acetaminophen (TYLENOL) 500 MG tablet Take 500 mg by mouth every 6 (six) hours as needed.   amLODipine (NORVASC) 2.5 MG tablet Take 1 tablet (2.5 mg total) by mouth daily.   aspirin 81 MG tablet Take 81 mg by mouth daily.   atorvastatin (LIPITOR) 20 MG tablet TAKE 1 TABLET(20 MG) BY MOUTH DAILY AT 6 PM   bismuth subsalicylate (PEPTO BISMOL) 262 MG/15ML suspension Take 30 mLs by mouth every 6 (six) hours as needed (for nausea, and vomiting.).   calcium carbonate (TUMS - DOSED IN MG ELEMENTAL CALCIUM) 500 MG chewable tablet Chew 1 tablet by mouth 2 (two) times daily as needed for indigestion or heartburn.   carvedilol (COREG) 12.5 MG tablet TAKE 1 TABLET(12.5 MG) BY MOUTH TWICE DAILY   lipase/protease/amylase (CREON) 36000 UNITS CPEP capsule Take 1 capsule (36,000 Units total) by mouth 3 (three) times daily with meals.   nitroGLYCERIN (NITROSTAT) 0.4 MG SL tablet Place 0.4 mg under the tongue every 5 (five) minutes as needed.   omeprazole (PRILOSEC) 20 MG capsule TAKE 2 CAPSULES BY MOUTH EVERY DAY   tamsulosin (FLOMAX) 0.4 MG CAPS capsule TAKE 1 CAPSULE BY MOUTH EVERY DAY   Thiamine HCl (B-1) 100 MG TABS TAKE 1 TABLET BY MOUTH EVERY DAY   traZODone (DESYREL) 50 MG  tablet Take 0.5-1 tablets (25-50 mg total) by mouth at bedtime as needed for sleep. (Patient not taking: Reported on 06/16/2021)   [DISCONTINUED] lovastatin (ALTOPREV) 40 MG 24 hr tablet Take 80 mg by mouth at bedtime.     [DISCONTINUED] simvastatin (ZOCOR) 40 MG tablet Take 40 mg by mouth at bedtime.     No facility-administered encounter medications on file as of 06/17/2021.   Lynann Bologna, CPA/CMA Clinical Pharmacist Assistant Phone: [phone]

## 2021-06-18 NOTE — Patient Instructions (Signed)
Visit Information It was great speaking with you today!  Please let me know if you have any questions about our visit.   Goals Addressed             This Visit's Progress    Track and Manage My Blood Pressure-Hypertension       Timeframe:  Long-Range Goal Priority:  High Start Date: 06/18/2021                            Expected End Date: 06/18/2022                      Follow Up within 90 days   - check blood pressure weekly    Why is this important?   You won't feel high blood pressure, but it can still hurt your blood vessels.  High blood pressure can cause heart or kidney problems. It can also cause a stroke.  Making lifestyle changes like losing a little weight or eating less salt will help.  Checking your blood pressure at home and at different times of the day can help to control blood pressure.  If the doctor prescribes medicine remember to take it the way the doctor ordered.  Call the office if you cannot afford the medicine or if there are questions about it.     Notes:         Patient Care Plan: General Pharmacy (Adult)     Problem Identified: Hypertension, Hyperlipidemia, Coronary Artery Disease, GERD, BPH, and History of NSTEMI   Priority: High     Long-Range Goal: Patient-Specific Goal   Start Date: 06/18/2021  Expected End Date: 06/18/2022  This Visit's Progress: On track  Priority: High  Note:   Current Barriers:  Unable to independently afford treatment regimen  Pharmacist Clinical Goal(s):  Patient will maintain control of blood pressure as evidenced by BP less than 140/90  through collaboration with PharmD and provider.   Interventions: 1:1 collaboration with [name], [name] regarding development and update of comprehensive plan of care as evidenced by provider attestation and co-signature Inter-disciplinary care team collaboration (see longitudinal plan of care) Comprehensive medication review performed; medication list updated in  electronic medical record  Hypertension (BP goal <140/90) -Controlled -Current treatment: Amlodipine 2.5 mg daily  Carvedilol 12.5 mg twice daily  -Medications previously tried: NA  -does report swelling in feet if not elevating them throughout the day -Current home readings: 126/74 -Denies hypotensive/hypertensive symptoms -Recommended to continue current medication  Hyperlipidemia: (LDL goal < 70) -Controlled -History of NSTEMI x3 (2000) -Current treatment: Atorvastatin 20 mg daily  -Medications previously tried: NA  -Recommended to continue current medication  Reflux (Goal: Minimize symptoms) -Controlled -Current treatment  Omeprazole 2 capsules daily before breakfast -Medications previously tried: NA  -Recommended to continue current medication  BPH (Goal: Minimize urinary symptoms) -Controlled -Current treatment  Tamsulosin 0.4 mg daily (HS)  -Medications previously tried: NA  -Recommended to continue current medication  Sleep (Goal: Improve sleep quality) -Uncontrolled -Current treatment  Trazodone 50 mg 0.5-1 tablets nightly as needed  -Medications previously tried: NA  -No benefit noted from trazodone  -Notices behavioral disturbances at nighttime. Patient wife thinks poor sleep is related to anxiety symptoms or pain rather than insomnia  -Recommend retrying trazodone, if ineffective can try melatonin 1 mg nightly for one month.  -Patient would likely benefit from work-up of anxiety symptoms.   Patient Goals/Self-Care Activities Patient will:  -  check blood pressure 2-3 times weekly, document, and provide at future appointments  Follow Up Plan: Telephone follow up appointment with care management team member scheduled for:  09/14/2021 at 3:00 PM      Patient agreed to services and verbal consent obtained.   Patient verbalizes understanding of instructions provided today and agrees to view in [place].   Junius Argyle, PharmD, Para March, CPP  Clinical  Pharmacist Practitioner  [facility] [phone]

## 2021-07-01 DIAGNOSIS — Z23 Encounter for immunization: Secondary | ICD-10-CM

## 2021-07-07 ENCOUNTER — Telehealth: Payer: Self-pay | Admitting: Family Medicine

## 2021-07-07 DIAGNOSIS — K219 Gastro-esophageal reflux disease without esophagitis: Secondary | ICD-10-CM

## 2021-07-07 MED ORDER — OMEPRAZOLE 20 MG PO CPDR: 40.0000 mg | DELAYED_RELEASE_CAPSULE | Freq: Every day | ORAL | 0 refills | Status: DC

## 2021-07-07 NOTE — Telephone Encounter (Signed)
[facility] faxed refill request for the following medications:  omeprazole (PRILOSEC) 20 MG capsule     Please advise.  

## 2021-07-08 DIAGNOSIS — E7800 Pure hypercholesterolemia, unspecified: Secondary | ICD-10-CM

## 2021-07-08 DIAGNOSIS — I10 Essential (primary) hypertension: Secondary | ICD-10-CM

## 2021-07-13 ENCOUNTER — Ambulatory Visit (INDEPENDENT_AMBULATORY_CARE_PROVIDER_SITE_OTHER): Payer: Medicare Other

## 2021-07-13 DIAGNOSIS — I10 Essential (primary) hypertension: Secondary | ICD-10-CM

## 2021-07-13 NOTE — Progress Notes (Signed)
My [employer] Assist Patient Assistance form for Creon completed by patient and faxed for review on 07/13/21  Junius Argyle, PharmD, Para March, [place] Pharmacist Practitioner  [facility] [phone]

## 2021-08-08 DIAGNOSIS — I10 Essential (primary) hypertension: Secondary | ICD-10-CM

## 2021-08-18 ENCOUNTER — Telehealth: Payer: Self-pay

## 2021-08-18 ENCOUNTER — Telehealth: Payer: Self-pay | Admitting: Family Medicine

## 2021-08-18 NOTE — Telephone Encounter (Signed)
Letter has been faxed. KW 

## 2021-08-18 NOTE — Telephone Encounter (Deleted)
Duplicate message, see previous encounter. KW

## 2021-08-18 NOTE — Telephone Encounter (Signed)
Fax received and left on your desk to sign. KW

## 2021-08-18 NOTE — Progress Notes (Signed)
Per Clinical pharmacist,please reschedule patient telephone appointment on 09/14/2021.  Patient wife agreed and reschedule Telephone follow up appointment with Care management team member to : 09/28/2021 at 3:45 pm.  Patient wife states for his patient assistance application for Creon  she received a letter from [place] stating they needed proof of patient insurance which she sent in a copy.Patient wife stated the letter states the provider license is not valid.Notified Clinical pharmacist.   08/20/2021: I reach out to [place] assist to get a update on patient assistance application for Creon:  Per [employer] assist, they need the provider state license number and expiration date.I gave them the provider NP number, but that was not what they needed.Once the state license number and expiration date is completed on the form , we can refax it so they can process the application.Notified Clinical Pharmacist.  09/02/2021:  I reach out to [facility] assist to get a update on patient assistance application for Creon.Per [employer] assist, patient was approved to received patient assistance for Creon for year 2023. Patient would need to re enroll by August 08 2022.  Inform patient wife of approval.Patient wife expressing appreciation,but is asking if he needs to be taking Creon.Patient wife states she has been trying to get in contact with Dr.Montgomery to confirm if he should be taking Creon, but no one ever responded.Patient wife reports they no longer visit Dr. Kinnie Scales, and his contact number is [phone].Patient wife states she wants to make sure he needs to being taking Creon because she does not want him to take any medicine if he does not need to .Patient wife reports she has ask the patient PCP if the patient should be taking Creon, and she was going to inform her but has not heard anything yet.Patient wife states the patient has a follow up with his PCP on 09/08/2021 , and will ask again.I  informed patient if he is not suppose to be taking Creon I could call and cancel the assistance that she would need to call and let me know but it is a good idea to double check first if he should be taking.Notified clinical pharmacist.  Per Clinical pharmacist, wants him to be taking 1 capsule three times daily with each meal   Patient wife verbalized appreciation, and understanding.  [place] Pharmacist Assistant [phone]

## 2021-08-18 NOTE — Telephone Encounter (Signed)
Caller name: Vivien Rota  Relation to pt: Writer from [employer] back number: phone # [phone] fax # [phone]    Reason for call:  Checking on the status of phsycain orders from 03/26/2021 order # [id number] from the nurse regarding pressure ulcer on right inner buttocks.Requiring PCP signature. Caller is re faxing order to [phone], Attention Tally Joe, please not when received and fax back.

## 2021-08-30 ENCOUNTER — Other Ambulatory Visit: Payer: Self-pay | Admitting: Family Medicine

## 2021-08-30 DIAGNOSIS — I10 Essential (primary) hypertension: Secondary | ICD-10-CM

## 2021-08-30 NOTE — Telephone Encounter (Signed)
Requested Prescriptions  Pending Prescriptions Disp Refills   amLODipine (NORVASC) 2.5 MG tablet [Pharmacy Med Name: AMLODIPINE BESYLATE 2.5MG  TABLETS] 90 tablet 0    Sig: TAKE 1 TABLET(2.5 MG) BY MOUTH DAILY     Cardiovascular:  Calcium Channel Blockers Failed - 08/30/2021  4:13 PM      Failed - Last BP in normal range    BP Readings from Last 1 Encounters:  06/05/21 (!) 141/75         Passed - Valid encounter within last 6 months    Recent Outpatient Visits          2 months ago Essential hypertension   [facility] Tally Joe T, [name]   4 months ago Hypotension due to hypovolemia   [facility] Tally Joe T, [name]   5 months ago Hypotension due to hypovolemia   [facility] Tally Joe T, [name]   5 months ago Hypotension due to hypovolemia   [facility] [name], [name]   8 months ago Hematuria, unspecified type   Casey, Vickki Muff, PA-C      Future Appointments            In 1 week [name], [place], [place]

## 2021-09-07 ENCOUNTER — Ambulatory Visit: Payer: Medicare Other | Admitting: Family Medicine

## 2021-09-08 ENCOUNTER — Encounter: Payer: Self-pay | Admitting: Family Medicine

## 2021-09-08 ENCOUNTER — Other Ambulatory Visit: Payer: Self-pay

## 2021-09-08 ENCOUNTER — Ambulatory Visit (INDEPENDENT_AMBULATORY_CARE_PROVIDER_SITE_OTHER): Payer: Medicare Other | Admitting: Family Medicine

## 2021-09-08 VITALS — BP 132/70 | HR 64 | Temp 98.4°F | Resp 15

## 2021-09-08 DIAGNOSIS — R7989 Other specified abnormal findings of blood chemistry: Secondary | ICD-10-CM

## 2021-09-08 DIAGNOSIS — R0600 Dyspnea, unspecified: Secondary | ICD-10-CM | POA: Diagnosis not present

## 2021-09-08 DIAGNOSIS — R6889 Other general symptoms and signs: Secondary | ICD-10-CM | POA: Diagnosis not present

## 2021-09-08 DIAGNOSIS — I10 Essential (primary) hypertension: Secondary | ICD-10-CM

## 2021-09-08 DIAGNOSIS — D508 Other iron deficiency anemias: Secondary | ICD-10-CM

## 2021-09-08 DIAGNOSIS — K851 Biliary acute pancreatitis without necrosis or infection: Secondary | ICD-10-CM

## 2021-09-08 DIAGNOSIS — R0601 Orthopnea: Secondary | ICD-10-CM

## 2021-09-08 DIAGNOSIS — I359 Nonrheumatic aortic valve disorder, unspecified: Secondary | ICD-10-CM

## 2021-09-08 DIAGNOSIS — Z125 Encounter for screening for malignant neoplasm of prostate: Secondary | ICD-10-CM | POA: Insufficient documentation

## 2021-09-08 DIAGNOSIS — I872 Venous insufficiency (chronic) (peripheral): Secondary | ICD-10-CM | POA: Insufficient documentation

## 2021-09-08 NOTE — Assessment & Plan Note (Signed)
Encourage increase water Encourage elevation Encourage increase exercise, as tolerate

## 2021-09-08 NOTE — Progress Notes (Signed)
Established patient visit   Patient: James Mora   DOB: [birth year]   86 y.o. Male  MRN: [id number] Visit Date: 09/08/2021  Today's healthcare provider: [name], [name]   Chief Complaint  Patient presents with   Hypertension   Subjective    HPI  Hypertension, follow-up  BP Readings from Last 3 Encounters:  09/08/21 132/70  06/05/21 (!) 141/75  03/31/21 93/64   Wt Readings from Last 3 Encounters:  06/05/21 175 lb 1.6 oz (79.4 kg)  03/24/21 166 lb (75.3 kg)  02/27/21 160 lb (72.6 kg)     He was last seen for hypertension 3 months ago.  BP at that visit was 141/75. Management since that visit includes   Elevated to 140s today LE edema on exam today +JVD today Recommend restart on norvasc   .  He reports excellent compliance with treatment. He is not having side effects.  He is following a Regular diet. He is not exercising. He does not smoke.  Use of agents associated with hypertension: NSAIDS.   Outside blood pressures are systolic ranging from 948-546 and diastolic 27-03. Symptoms: No chest pain No chest pressure  No palpitations No syncope  No dyspnea No orthopnea  No paroxysmal nocturnal dyspnea Yes lower extremity edema   Pertinent labs: Lab Results  Component Value Date   CHOL 125 02/01/2021   HDL 49 02/01/2021   LDLCALC 62 02/01/2021   TRIG 68 02/01/2021   CHOLHDL 2.6 02/01/2021   Lab Results  Component Value Date   NA 142 03/24/2021   K 3.9 03/24/2021   CREATININE 1.32 (H) 03/24/2021   EGFR 54 (L) 03/24/2021   GLUCOSE 122 (H) 03/24/2021   TSH 5.170 (H) 01/02/2021     The ASCVD Risk score (Arnett DK, et al., 2019) failed to calculate for the following reasons:   The 2019 ASCVD risk score is only valid for ages 66 to 39   The patient has a prior MI or stroke diagnosis   ---------------------------------------------------------------------------------------------------   Medications: Outpatient Medications Prior to Visit   Medication Sig   acetaminophen (TYLENOL) 500 MG tablet Take 500 mg by mouth every 6 (six) hours as needed.   amLODipine (NORVASC) 2.5 MG tablet TAKE 1 TABLET(2.5 MG) BY MOUTH DAILY   aspirin 81 MG tablet Take 81 mg by mouth daily.   atorvastatin (LIPITOR) 20 MG tablet TAKE 1 TABLET(20 MG) BY MOUTH DAILY AT 6 PM   bismuth subsalicylate (PEPTO BISMOL) 262 MG/15ML suspension Take 30 mLs by mouth every 6 (six) hours as needed (for nausea, and vomiting.).   calcium carbonate (TUMS - DOSED IN MG ELEMENTAL CALCIUM) 500 MG chewable tablet Chew 1 tablet by mouth 2 (two) times daily as needed for indigestion or heartburn.   carvedilol (COREG) 12.5 MG tablet TAKE 1 TABLET(12.5 MG) BY MOUTH TWICE DAILY   lipase/protease/amylase (CREON) 36000 UNITS CPEP capsule Take 1 capsule (36,000 Units total) by mouth 3 (three) times daily with meals.   nitroGLYCERIN (NITROSTAT) 0.4 MG SL tablet Place 0.4 mg under the tongue every 5 (five) minutes as needed.   omeprazole (PRILOSEC) 20 MG capsule Take 2 capsules (40 mg total) by mouth daily.   tamsulosin (FLOMAX) 0.4 MG CAPS capsule TAKE 1 CAPSULE BY MOUTH EVERY DAY   Thiamine HCl (B-1) 100 MG TABS TAKE 1 TABLET BY MOUTH EVERY DAY   traZODone (DESYREL) 50 MG tablet Take 0.5-1 tablets (25-50 mg total) by mouth at bedtime as needed for sleep. (Patient  not taking: Reported on 06/16/2021)   No facility-administered medications prior to visit.    Review of Systems     Objective    BP 132/70    Pulse 64    Temp 98.4 F (36.9 C) (Oral)    Resp 15    SpO2 97%    Physical Exam Vitals and nursing note reviewed.  Constitutional:      Appearance: Normal appearance. He is normal weight.  HENT:     Head: Normocephalic and atraumatic.  Eyes:     Pupils: Pupils are equal, round, and reactive to light.  Neck:     Vascular: JVD present.  Cardiovascular:     Rate and Rhythm: Normal rate and regular rhythm.     Pulses:          Dorsalis pedis pulses are 1+ on the right  side.       Posterior tibial pulses are 1+ on the right side.     Heart sounds: Normal heart sounds.  Pulmonary:     Effort: Pulmonary effort is normal.     Breath sounds: Normal breath sounds.  Musculoskeletal:        General: Normal range of motion.     Cervical back: Normal range of motion.     Right lower leg: 2+ Pitting Edema present.       Legs:  Skin:    General: Skin is warm and dry.     Capillary Refill: Capillary refill takes less than 2 seconds.     Findings: Rash present. Rash is crusting.       Neurological:     General: No focal deficit present.     Mental Status: He is alert and oriented to person, place, and time. Mental status is at baseline.  Psychiatric:        Attention and Perception: Attention normal.        Mood and Affect: Mood is depressed. Affect is flat.        Speech: He is noncommunicative.        Behavior: Behavior is withdrawn.        Thought Content: Thought content normal.        Cognition and Memory: Cognition normal.        Judgment: Judgment normal.     No results found for any visits on 09/08/21.  Assessment & Plan     Problem List Items Addressed This Visit       Cardiovascular and Mediastinum   Essential hypertension - Primary    Chronic, stable Denies CP Denies SOB Denies DOE LE present; recommend cards for med mgmt Continue medication Refills stable Seek emergent care if you develop CP, chest pain or chest pressure       Relevant Orders   Comprehensive metabolic panel   Ambulatory referral to Cardiology   Aortic valve disorder    Recommend f/u with cards Variable HTN/hypotension LE edema +JVD Intermittent dizziness      Relevant Orders   Ambulatory referral to Cardiology   Venous insufficiency of right leg    Encourage increase water Encourage elevation Encourage increase exercise, as tolerate       Relevant Orders   Ambulatory referral to Podiatry   Ambulatory referral to Cardiology     Digestive    Gallstone pancreatitis    Has received confirmation of creon assistance acceptance       Relevant Orders   Ambulatory referral to General Surgery     Other  Exercise intolerance    Improving Referral to cards      Relevant Orders   Ambulatory referral to Cardiology   PND (paroxysmal nocturnal dyspnea)    Denies choking; reports anxiety like symptoms per wife Trazodone did not assist nor did 1 mg of melatonin Ref to cards      Relevant Orders   Ambulatory referral to Cardiology   Orthopnea    Can sleep in bed with 5 pillows for 1-2 hours; then will sleep in chair      Relevant Orders   Ambulatory referral to Cardiology   Screening for prostate cancer    PSA scree      Relevant Orders   PSA   Elevated TSH    Repeat thyroid labs Wife reports anxiety at night Pt reports ups/downs      Relevant Orders   TSH + free T4   Iron deficiency anemia secondary to inadequate dietary iron intake    Repeat CBC Color improved on exam      Relevant Orders   CBC with Differential/Platelet     Return in about 3 months (around 12/06/2021) for chonic disease management.      Vonna Kotyk, [name], have reviewed all documentation for this visit. The documentation on 09/08/21 for the exam, diagnosis, procedures, and orders are all accurate and complete.    [name], [place] [phone] (phone) [phone] (fax)  [place]

## 2021-09-08 NOTE — Assessment & Plan Note (Signed)
PSA scree

## 2021-09-08 NOTE — Assessment & Plan Note (Signed)
Repeat CBC Color improved on exam

## 2021-09-08 NOTE — Assessment & Plan Note (Signed)
Improving Referral to cards

## 2021-09-08 NOTE — Assessment & Plan Note (Signed)
Denies choking; reports anxiety like symptoms per wife Trazodone did not assist nor did 1 mg of melatonin Ref to cards

## 2021-09-08 NOTE — Assessment & Plan Note (Signed)
Recommend f/u with cards Variable HTN/hypotension LE edema +JVD Intermittent dizziness

## 2021-09-08 NOTE — Assessment & Plan Note (Signed)
Can sleep in bed with 5 pillows for 1-2 hours; then will sleep in chair

## 2021-09-08 NOTE — Assessment & Plan Note (Signed)
Chronic, stable Denies CP Denies SOB Denies DOE LE present; recommend cards for med mgmt Continue medication Refills stable Seek emergent care if you develop CP, chest pain or chest pressure

## 2021-09-08 NOTE — Assessment & Plan Note (Signed)
Has received confirmation of creon assistance acceptance

## 2021-09-08 NOTE — Assessment & Plan Note (Signed)
Repeat thyroid labs Wife reports anxiety at night Pt reports ups/downs

## 2021-09-09 LAB — TSH+FREE T4
Free T4: 1.06 ng/dL (ref 0.82–1.77)
TSH: 7.75 u[IU]/mL — ABNORMAL HIGH (ref 0.450–4.500)

## 2021-09-09 LAB — CBC WITH DIFFERENTIAL/PLATELET
Basophils Absolute: 0 10*3/uL (ref 0.0–0.2)
Basos: 1 %
EOS (ABSOLUTE): 0.1 10*3/uL (ref 0.0–0.4)
Eos: 1 %
Hematocrit: 32.9 % — ABNORMAL LOW (ref 37.5–51.0)
Hemoglobin: 10 g/dL — ABNORMAL LOW (ref 13.0–17.7)
Immature Grans (Abs): 0 10*3/uL (ref 0.0–0.1)
Immature Granulocytes: 0 %
Lymphocytes Absolute: 1.2 10*3/uL (ref 0.7–3.1)
Lymphs: 22 %
MCH: 25.5 pg — ABNORMAL LOW (ref 26.6–33.0)
MCHC: 30.4 g/dL — ABNORMAL LOW (ref 31.5–35.7)
MCV: 84 fL (ref 79–97)
Monocytes Absolute: 0.4 10*3/uL (ref 0.1–0.9)
Monocytes: 7 %
Neutrophils Absolute: 3.7 10*3/uL (ref 1.4–7.0)
Neutrophils: 69 %
Platelets: 234 10*3/uL (ref 150–450)
RBC: 3.92 x10E6/uL — ABNORMAL LOW (ref 4.14–5.80)
RDW: 15.7 % — ABNORMAL HIGH (ref 11.6–15.4)
WBC: 5.4 10*3/uL (ref 3.4–10.8)

## 2021-09-09 LAB — COMPREHENSIVE METABOLIC PANEL
ALT: 8 IU/L (ref 0–44)
AST: 10 IU/L (ref 0–40)
Albumin/Globulin Ratio: 1.2 (ref 1.2–2.2)
Albumin: 3.6 g/dL (ref 3.6–4.6)
Alkaline Phosphatase: 101 IU/L (ref 44–121)
BUN/Creatinine Ratio: 17 (ref 10–24)
BUN: 20 mg/dL (ref 8–27)
Bilirubin Total: 0.5 mg/dL (ref 0.0–1.2)
CO2: 23 mmol/L (ref 20–29)
Calcium: 8.8 mg/dL (ref 8.6–10.2)
Chloride: 106 mmol/L (ref 96–106)
Creatinine, Ser: 1.18 mg/dL (ref 0.76–1.27)
Globulin, Total: 2.9 g/dL (ref 1.5–4.5)
Glucose: 105 mg/dL — ABNORMAL HIGH (ref 70–99)
Potassium: 4.3 mmol/L (ref 3.5–5.2)
Sodium: 143 mmol/L (ref 134–144)
Total Protein: 6.5 g/dL (ref 6.0–8.5)
eGFR: 61 mL/min/{1.73_m2} (ref >59)

## 2021-09-09 LAB — PSA: Prostate Specific Ag, Serum: 5.4 ng/mL — ABNORMAL HIGH (ref 0.0–4.0)

## 2021-09-14 ENCOUNTER — Telehealth: Payer: Medicare Other

## 2021-09-17 ENCOUNTER — Ambulatory Visit: Payer: Medicare Other | Admitting: Cardiovascular Disease

## 2021-09-17 ENCOUNTER — Telehealth: Payer: Self-pay | Admitting: Cardiovascular Disease

## 2021-09-17 ENCOUNTER — Encounter: Payer: Self-pay | Admitting: Surgery

## 2021-09-17 ENCOUNTER — Other Ambulatory Visit: Payer: Self-pay

## 2021-09-17 ENCOUNTER — Encounter: Payer: Self-pay | Admitting: Cardiovascular Disease

## 2021-09-17 ENCOUNTER — Telehealth: Payer: Self-pay

## 2021-09-17 ENCOUNTER — Ambulatory Visit: Payer: Medicare Other | Admitting: Surgery

## 2021-09-17 VITALS — BP 108/60 | HR 77 | Ht 70.0 in | Wt 176.0 lb

## 2021-09-17 VITALS — BP 121/69 | HR 73 | Temp 98.6°F | Ht 70.0 in | Wt 174.2 lb

## 2021-09-17 DIAGNOSIS — I2510 Atherosclerotic heart disease of native coronary artery without angina pectoris: Secondary | ICD-10-CM

## 2021-09-17 DIAGNOSIS — R0602 Shortness of breath: Secondary | ICD-10-CM | POA: Diagnosis not present

## 2021-09-17 DIAGNOSIS — I10 Essential (primary) hypertension: Secondary | ICD-10-CM | POA: Diagnosis not present

## 2021-09-17 DIAGNOSIS — E785 Hyperlipidemia, unspecified: Secondary | ICD-10-CM

## 2021-09-17 DIAGNOSIS — K8020 Calculus of gallbladder without cholecystitis without obstruction: Secondary | ICD-10-CM

## 2021-09-17 DIAGNOSIS — I351 Nonrheumatic aortic (valve) insufficiency: Secondary | ICD-10-CM

## 2021-09-17 MED ORDER — FUROSEMIDE 20 MG PO TABS: 20.0000 mg | ORAL_TABLET | Freq: Every day | ORAL | 2 refills | Status: DC

## 2021-09-17 NOTE — Patient Instructions (Addendum)
Please see your follow up appointment listed below.  °

## 2021-09-17 NOTE — Patient Instructions (Signed)
Medication Instructions:  Your physician has recommended you make the following change in your medication:   START Lasix (furosemide) 20 mg daily. An Rx has been sent to your pharmacy.  *If you need a refill on your cardiac medications before your next appointment, please call your pharmacy*   Lab Work: Your physician recommends that you return for (bmp) lab work in: 1 week  If you have labs (blood work) drawn today and your tests are completely normal, you will receive your results only by: [place] (if you have MyChart) OR A paper copy in the mail If you have any lab test that is abnormal or we need to change your treatment, we will call you to review the results.   Testing/Procedures: Your physician has requested that you have an echocardiogram. Echocardiography is a painless test that uses sound waves to create images of your heart. It provides your doctor with information about the size and shape of your heart and how well your hearts chambers and valves are working. This procedure takes approximately one hour. There are no restrictions for this procedure.    Follow-Up: At [facility], you and your health needs are our priority.  As part of our continuing mission to provide you with exceptional heart care, we have created designated Provider Care Teams.  These Care Teams include your primary Cardiologist (physician) and Advanced Practice Providers (APPs -  Physician Assistants and Nurse Practitioners) who all work together to provide you with the care you need, when you need it.  We recommend signing up for the patient portal called "MyChart".  Sign up information is provided on this After Visit Summary.  MyChart is used to connect with patients for Virtual Visits (Telemedicine).  Patients are able to view lab/test results, encounter notes, upcoming appointments, etc.  Non-urgent messages can be sent to your provider as well.   To learn more about what you can do with  MyChart, go to [email or web address].    Your next appointment:   4 week(s)  The format for your next appointment:   In Person  Provider:   You may see [name], MD or one of the following Advanced Practice Providers on your designated Care Team:   Murray Hodgkins, NP Christell Faith, PA-C Cadence Kathlen Mody, [place]   Other Instructions N/A

## 2021-09-17 NOTE — Telephone Encounter (Signed)
Patient was seen in the office today for cardiac clearance.  Await echocardiogram for final clearance.  Echo scheduled on 10/09/2021

## 2021-09-17 NOTE — Telephone Encounter (Signed)
Cardiac cLearance faxed to Dr.Arida Muhammad-patient has appointment scheduled 09/17/21 @ 1:40.

## 2021-09-17 NOTE — Progress Notes (Signed)
Patient ID: James Mora, male   DOB: 03/06/1936, 86 y.o.   MRN: [id number]  Chief Complaint: History of gallstone pancreatitis  History of Present Illness James Mora is a 86 y.o. male with history of quite severe gallstone pancreatitis last summer.  Apparently this took such a tremendous toll on him that it required a prolonged hospitalization, along with rehab.  He is currently at home and continuing to make progress in regaining his strength.  It seems he feels somewhat threatened by the chance that he could have a relapse or recurrence of his gallstone pancreatitis.  He is currently taking Creon for apparently a degree of pancreatic insufficiency.  He currently denies any abdominal pain, reports tolerating a diet well without postprandial pain, nausea or vomiting.  He denies any recent history of jaundice, fevers or chills.  He did report that he typically sleeps upright in a chair, stating he gets short of breath when laying supine.  However his wife indicates that he may have had a panic attack at one time when laid repositioned in a steep Trendelenburg position, and this is the source of his shortness of breath.  He is scheduled to see his cardiologist today.  And we will look to them for guidance regarding anesthetic risk for surgery.  Past Medical History Past Medical History:  Diagnosis Date   Acid reflux    Aortic insufficiency    a. 03/2016 Echo: EF 55-60%, no rwma, Gr1 DD, mild to mod AI, mild TR.   CAD (coronary artery disease)    a. 2000 Cath: Severe distal LAD disease with left to left collaterals;  b. 2008 MV: non-ischemic; c. 03/2016 Cath: LM nl, LAD 30p, D1/D2 mod, nl, LCX nl, OM1/2 nl w/ L-->L collats to apical LAD, RCA dominant, nl. EF 55-65%.   Hypertensive heart disease 2005   unspecified   Mixed hyperlipidemia    mixed      Past Surgical History:  Procedure Laterality Date   CARDIAC CATHETERIZATION  2000   left heart   CARDIAC CATHETERIZATION N/A 03/22/2016    Procedure: Left Heart Cath And Coronary Angiography;  Surgeon: Minna Merritts, MD;  Location: [facility];  Service: Cardiovascular;  Laterality: N/A;   COLONOSCOPY  2008   Dr. Allen Norris    Allergies  Allergen Reactions   Imodium [Loperamide] Nausea And Vomiting   Hydroxyzine Hcl     Other reaction(s): Other (See Comments) Sedation and myoclonic jerks of all four extremities. Noted 02/19/21.     Current Outpatient Medications  Medication Sig Dispense Refill   acetaminophen (TYLENOL) 500 MG tablet Take 500 mg by mouth every 6 (six) hours as needed.     amLODipine (NORVASC) 2.5 MG tablet TAKE 1 TABLET(2.5 MG) BY MOUTH DAILY 90 tablet 0   aspirin 81 MG tablet Take 81 mg by mouth daily.     atorvastatin (LIPITOR) 20 MG tablet TAKE 1 TABLET(20 MG) BY MOUTH DAILY AT 6 PM 90 tablet 3   calcium carbonate (TUMS - DOSED IN MG ELEMENTAL CALCIUM) 500 MG chewable tablet Chew 1 tablet by mouth 2 (two) times daily as needed for indigestion or heartburn.     carvedilol (COREG) 12.5 MG tablet TAKE 1 TABLET(12.5 MG) BY MOUTH TWICE DAILY 180 tablet 3   lipase/protease/amylase (CREON) 36000 UNITS CPEP capsule Take 1 capsule (36,000 Units total) by mouth 3 (three) times daily with meals. 270 capsule 1   nitroGLYCERIN (NITROSTAT) 0.4 MG SL tablet Place 0.4 mg under the tongue  every 5 (five) minutes as needed.     omeprazole (PRILOSEC) 20 MG capsule Take 2 capsules (40 mg total) by mouth daily. 180 capsule 0   tamsulosin (FLOMAX) 0.4 MG CAPS capsule TAKE 1 CAPSULE BY MOUTH EVERY DAY 90 capsule 1   Thiamine HCl (B-1) 100 MG TABS TAKE 1 TABLET BY MOUTH EVERY DAY 90 tablet 3   No current facility-administered medications for this visit.    Family History Family History  Family history unknown: Yes      Social History Social History   Tobacco Use   Smoking status: Never   Smokeless tobacco: Never  Vaping Use   Vaping Use: Never used  Substance Use Topics   Alcohol use: No   Drug use: No        Review of Systems  Constitutional:  Positive for weight loss.  HENT:  Positive for hearing loss.   Eyes: Negative.   Respiratory:  Positive for cough and shortness of breath.   Cardiovascular:  Positive for orthopnea, claudication and leg swelling.  Gastrointestinal:  Negative for abdominal pain, blood in stool, constipation, diarrhea, heartburn, melena, nausea and vomiting.  Genitourinary: Negative.   Skin: Negative.   Neurological: Negative.   Psychiatric/Behavioral:  Positive for depression.      Physical Exam Blood pressure 121/69, pulse 73, temperature 98.6 F (37 C), temperature source Oral, height 5\' 10"  (1.778 m), weight 174 lb 3.2 oz (79 kg), SpO2 90 %. Last Weight  Most recent update: 09/17/2021 10:32 AM    Weight  79 kg (174 lb 3.2 oz)             CONSTITUTIONAL: Well developed, and nourished, appropriately responsive and aware without distress.   EYES: Sclera non-icteric.   EARS, NOSE, MOUTH AND THROAT: Mask worn.    Hearing is intact to voice.  NECK: Trachea is midline, and there is no jugular venous distension.  LYMPH NODES:  Lymph nodes in the neck are not enlarged. RESPIRATORY:  Lungs are clear, and breath sounds are equal bilaterally. Normal respiratory effort without pathologic use of accessory muscles. CARDIOVASCULAR: Heart is regular in rate. GI: The abdomen is rounded and somewhat firm, but nontender. There were no palpable masses. I did not appreciate hepatosplenomegaly. There were normal bowel sounds. MUSCULOSKELETAL: No appreciable bony deformities, muscle mass diminished.    SKIN: Skin turgor is normal. No pathologic skin lesions appreciated.  NEUROLOGIC:  Motor and sensation appear grossly normal.  Cranial nerves are grossly without defect. PSYCH:  Alert and oriented to person, place and time. Affect is appropriate for situation.  Data Reviewed I have personally reviewed what is currently available of the patient's imaging, recent labs and medical  records.   Labs:  CBC Latest Ref Rng & Units 09/08/2021 03/24/2021 02/27/2021  WBC 3.4 - 10.8 x10E3/uL 5.4 5.9 6.5  Hemoglobin 13.0 - 17.7 g/dL 10.0(L) 9.5(L) 9.4(L)  Hematocrit 37.5 - 51.0 % 32.9(L) 29.8(L) 29.5(L)  Platelets 150 - 450 x10E3/uL 234 224 243   CMP Latest Ref Rng & Units 09/08/2021 03/24/2021 02/27/2021  Glucose 70 - 99 mg/dL 105(H) 122(H) 109(H)  BUN 8 - 27 mg/dL 20 23 22   Creatinine 0.76 - 1.27 mg/dL 1.18 1.32(H) 1.17  Sodium 134 - 144 mmol/L 143 142 137  Potassium 3.5 - 5.2 mmol/L 4.3 3.9 4.0  Chloride 96 - 106 mmol/L 106 105 104  CO2 20 - 29 mmol/L 23 24 28   Calcium 8.6 - 10.2 mg/dL 8.8 8.7 8.5(L)  Total Protein 6.0 -  8.5 g/dL 6.5 6.0 6.2(L)  Total Bilirubin 0.0 - 1.2 mg/dL 0.5 0.5 1.1  Alkaline Phos 44 - 121 IU/L 101 111 74  AST 0 - 40 IU/L 10 14 30   ALT 0 - 44 IU/L 8 9 25       Imaging: Radiology review: MRCP and ultrasound reports reviewed from June/July 2022.  Within last 24 hrs: No results found.  Assessment    History of gallstone pancreatitis, severe and debilitating. Last ultrasound showed residual centimeter stone, without common bile duct abnormality. Last LFTs noted above. Patient Active Problem List   Diagnosis Date Noted   PND (paroxysmal nocturnal dyspnea) 09/08/2021   Orthopnea 09/08/2021   Venous insufficiency of right leg 09/08/2021   Screening for prostate cancer 09/08/2021   Elevated TSH 09/08/2021   Iron deficiency anemia secondary to inadequate dietary iron intake 09/08/2021   Insomnia 06/05/2021   Exercise intolerance 06/05/2021   Constipation 03/31/2021   Hypotension due to hypovolemia 03/24/2021   Dehydration 03/24/2021   Gallstone pancreatitis    Shortness of breath    Gallstones    Enteritis    Acute pancreatitis 02/01/2021   Narrowing of intervertebral disc space 06/18/2016   Elevated prostate specific antigen (PSA) 06/18/2016   Benign prostatic hyperplasia without lower urinary tract symptoms 06/18/2016   HLD  (hyperlipidemia)    Mixed hyperlipidemia    CAD (coronary artery disease)    Coronary artery disease involving native coronary artery of native heart with angina pectoris with documented spasm (HCC)    NSTEMI (non-ST elevated myocardial infarction) ([place]) 03/19/2016   HTN (hypertension)    High blood cholesterol level    Acid reflux    Hyperlipidemia 12/27/2008   Essential hypertension 12/27/2008   Coronary atherosclerosis 12/27/2008   Aortic valve disorder 12/27/2008   Esophagitis, reflux 06/06/2006   Hypertensive heart disease 08/10/2003    Plan    We discussed balancing the risks of cholecystectomy to eliminate the threat of a gallstone pancreatitis versus his current lack of symptoms and his overall risk of anesthesia. I feel as well as he is doing with the progress he is made, I do not think an intervention is wise at this time, and would like to continue to see him make progress in elevating his new baseline. His daughter and wife are present and agree we will follow-up with cardiology and abide by their recommendations for any additional testing. We will have them back in about 3 weeks to reevaluate how he is getting along.  We discussed the avoidance of fats in his diet, but he appears to tolerate supplemental peanut butter on a regular basis.  Face-to-face time spent with the patient and accompanying care providers(if present) was 45 minutes, with more than 50% of the time spent counseling, educating, and coordinating care of the patient.    These notes generated with voice recognition software. I apologize for typographical errors.  [name] M.D., FACS 09/17/2021, 10:35 AM

## 2021-09-17 NOTE — Telephone Encounter (Signed)
° °  Pre-operative Risk Assessment    Patient Name: James Mora  DOB: [birth year] MRN: [id number]      Request for Surgical Clearance    Procedure:   Laparoscopic Cholecystectomy   Date of Surgery:  Clearance TBD                                 Surgeon:  Dr [name] Surgeon's Group or Practice Name:  [facility]  Phone number:  [phone] Fax number:  [phone]   Type of Clearance Requested:   - Medical    Type of Anesthesia:  Not Indicated   Additional requests/questions:    Manfred Arch   09/17/2021, 3:52 PM

## 2021-09-17 NOTE — Progress Notes (Signed)
Cardiology Office Note   Date:  09/17/2021   ID:  James Mora, DOB Feb 01, 1936, MRN [id number]  PCP:  [name], [name]  Cardiologist:   [name], MD   Chief Complaint  Patient presents with   Other    Second opinion pt needing cardiac clearance for gallbladder surgery. Meds reviewed verbally with pt.      History of Present Illness: James Mora is a 86 y.o. male who presents for a follow-up visit regarding coronary artery disease. He has known history of CAD as detailed below, aortic insufficiency, hypertensive heart disease, CKD stage II, and hyperlipidemia .   Cardiac catheterization in 2000 showed severe distal LAD disease with left to left collaterals that was managed medically at that time.  He was hospitalized in August 2017 with non-ST elevation myocardial infarction.   LHC showed mild proximal LAD disease with 30% stenosis and possible occlusion of a small PL 3.  Ejection fraction was normal.  Medical management was recommended.  Echocardiogram showed normal LV systolic function with stable mild to moderate aortic insufficiency.   He had significant leg edema that improved after stopping amlodipine.    He was hospitalized in July 2022 with acute pancreatitis with gallstones and enteritis.  The patient had peripancreatic edema resulting in significant abdominal pain.  He had an MRCP done which showed no evidence of stones but there was tapering of the common bile duct.  The patient was transferred to [facility] where he stayed there for another week and then he was discharged to rehab.  The plan was to proceed with cholecystectomy but giving ongoing issues with abdominal pain, that has not happened yet. The patient is admitted in cholecystectomy and he was sent to see me for preop cardiovascular evaluation.  He has been having orthopnea and increased leg edema.  In addition, his abdomen is distended.  He gained about 8 to 10 pounds.  No chest pain.   Past Medical History:   Diagnosis Date   Acid reflux    Aortic insufficiency    a. 03/2016 Echo: EF 55-60%, no rwma, Gr1 DD, mild to mod AI, mild TR.   CAD (coronary artery disease)    a. 2000 Cath: Severe distal LAD disease with left to left collaterals;  b. 2008 MV: non-ischemic; c. 03/2016 Cath: LM nl, LAD 30p, D1/D2 mod, nl, LCX nl, OM1/2 nl w/ L-->L collats to apical LAD, RCA dominant, nl. EF 55-65%.   Hypertensive heart disease 2005   unspecified   Mixed hyperlipidemia    mixed    Past Surgical History:  Procedure Laterality Date   CARDIAC CATHETERIZATION  2000   left heart   CARDIAC CATHETERIZATION N/A 03/22/2016   Procedure: Left Heart Cath And Coronary Angiography;  Surgeon: Minna Merritts, MD;  Location: [facility];  Service: Cardiovascular;  Laterality: N/A;   COLONOSCOPY  2008   Dr. Allen Norris     Current Outpatient Medications  Medication Sig Dispense Refill   acetaminophen (TYLENOL) 500 MG tablet Take 500 mg by mouth every 6 (six) hours as needed.     amLODipine (NORVASC) 2.5 MG tablet TAKE 1 TABLET(2.5 MG) BY MOUTH DAILY 90 tablet 0   aspirin 81 MG tablet Take 81 mg by mouth daily.     atorvastatin (LIPITOR) 20 MG tablet TAKE 1 TABLET(20 MG) BY MOUTH DAILY AT 6 PM 90 tablet 3   calcium carbonate (TUMS - DOSED IN MG ELEMENTAL CALCIUM) 500 MG chewable tablet Chew  1 tablet by mouth 2 (two) times daily as needed for indigestion or heartburn.     carvedilol (COREG) 12.5 MG tablet TAKE 1 TABLET(12.5 MG) BY MOUTH TWICE DAILY 180 tablet 3   lipase/protease/amylase (CREON) 36000 UNITS CPEP capsule Take 1 capsule (36,000 Units total) by mouth 3 (three) times daily with meals. 270 capsule 1   nitroGLYCERIN (NITROSTAT) 0.4 MG SL tablet Place 0.4 mg under the tongue every 5 (five) minutes as needed.     omeprazole (PRILOSEC) 20 MG capsule Take 2 capsules (40 mg total) by mouth daily. 180 capsule 0   tamsulosin (FLOMAX) 0.4 MG CAPS capsule TAKE 1 CAPSULE BY MOUTH EVERY DAY 90 capsule 1   Thiamine  HCl (B-1) 100 MG TABS TAKE 1 TABLET BY MOUTH EVERY DAY 90 tablet 3   traZODone (DESYREL) 50 MG tablet Take 25-50 mg by mouth at bedtime as needed for sleep.     No current facility-administered medications for this visit.    Allergies:   Imodium [loperamide] and Hydroxyzine hcl    Social History:  The patient  reports that he has never smoked. He has never used smokeless tobacco. He reports that he does not drink alcohol and does not use drugs.   Family History:  The patient's Family history is unknown by patient.      PHYSICAL EXAM: VS:  BP 108/60 (BP Location: Left Arm, Patient Position: Sitting, Cuff Size: Normal)    Pulse 77    Ht 5\' 10"  (1.778 m)    Wt 176 lb (79.8 kg)    SpO2 96%    BMI 25.25 kg/m  , BMI Body mass index is 25.25 kg/m. GEN: Well nourished, well developed, in no acute distress  HEENT: normal  Neck: no carotid bruits, or masses.  Positive JVD Cardiac: RRR; no murmurs, rubs, or gallops, mild bilateral leg edema worse on the right side Respiratory:  clear to auscultation bilaterally, normal work of breathing GI: soft, nontender,.  Abdomen is distended. MS: no deformity or atrophy  Skin: warm and dry, Chronic stasis dermatitis Neuro:  Strength and sensation are intact Psych: euthymic mood, full affect    EKG:  EKG is ordered today. The ekg ordered today demonstrates sinus rhythm with PACs and right bundle branch block.  Recent Labs: 02/01/2021: Magnesium 1.7 02/27/2021: B Natriuretic Peptide 149.7 09/08/2021: ALT 8; BUN 20; Creatinine, Ser 1.18; Hemoglobin 10.0; Platelets 234; Potassium 4.3; Sodium 143; TSH 7.750    Lipid Panel    Component Value Date/Time   CHOL 125 02/01/2021 1849   CHOL 125 01/02/2021 0809   TRIG 68 02/01/2021 1849   HDL 49 02/01/2021 1849   HDL 45 01/02/2021 0809   CHOLHDL 2.6 02/01/2021 1849   VLDL 14 02/01/2021 1849   LDLCALC 62 02/01/2021 1849   LDLCALC 64 01/02/2021 0809   LDLCALC 58 06/20/2017 0959      Wt Readings from  Last 3 Encounters:  09/17/21 176 lb (79.8 kg)  09/17/21 174 lb 3.2 oz (79 kg)  06/05/21 175 lb 1.6 oz (79.4 kg)         ASSESSMENT AND PLAN:  1.  Heart failure: Patient has symptoms of increased shortness of breath as well as orthopnea.  By exam, there is JVD, abdominal distention and increased lower extremity edema.  In addition, his weight is up above baseline.  Most recent echocardiogram showed normal LV systolic function.  I elected to add furosemide 20 mg daily and check basic metabolic profile in 1 week.  I requested a repeat echocardiogram given change in his clinical status.  2.  Coronary artery disease involving native coronary arteries: No chest pain but patient does have shortness of breath.  No need for ischemic cardiac evaluation for now.  2.  Hyperlipidemia: Continue atorvastatin.  I reviewed most recent lipid profile which showed an LDL of 48.  3.  Essential hypertension: Blood pressure is well controlled on current medications  4.  Aortic insufficiency: This was mild on most recent echocardiogram.  5.  Preop cardiovascular evaluation for cholecystectomy: I suspect that he will be able to proceed in the near future but we have to make sure his echocardiogram is unremarkable.   Disposition: Proceed with an echocardiogram and follow-up in a month.  Signed,  [name], MD  09/17/2021 1:51 PM    James Mora

## 2021-09-20 ENCOUNTER — Other Ambulatory Visit: Payer: Self-pay | Admitting: Family Medicine

## 2021-09-21 ENCOUNTER — Telehealth: Payer: Self-pay

## 2021-09-21 NOTE — Telephone Encounter (Signed)
° °  Patient Name: James Mora  DOB: 09/18/1935 MRN: [id number]  Primary Cardiologist: Dr. Fletcher Anon  Chart reviewed as part of pre-operative protocol coverage. Patient seen 09/17/21 by Dr. Fletcher Anon for pre-operative evaluation. Dr. Fletcher Anon recommended echocardiogram prior to clearance. Per office protocol, the cardiology provider should forward their finalized clearance decision to requesting party below. I will route this message as FYI to requesting party and remove this message from the pre-op box as separate preop APP input not needed at this time.  Please call with questions.  Charlie Pitter, PA-C 09/21/2021, 12:15 PM

## 2021-09-21 NOTE — Telephone Encounter (Signed)
Dr.Arida recommends Echocardiogram prior to clearance. To be scheduled and will send Clearance over after Echo has been done.

## 2021-09-21 NOTE — Telephone Encounter (Signed)
Requested medication (s) are due for refill today: no, as newer refill, also this is a historical provider.  Requested medication (s) are on the active medication list: yes, signature not complete  Future visit scheduled: 12/07/21  Notes to clinic:  Historical Provider, please assess.   Requested Prescriptions  Pending Prescriptions Disp Refills   traZODone (DESYREL) 50 MG tablet [Pharmacy Med Name: TRAZODONE 50MG  TABLETS] 30 tablet     Sig: TAKE 1/2 TO 1 TABLET(25 TO 50 MG) BY MOUTH AT BEDTIME AS NEEDED FOR SLEEP     Psychiatry: Antidepressants - Serotonin Modulator Passed - 09/20/2021 12:56 PM      Passed - Valid encounter within last 6 months    Recent Outpatient Visits           1 week ago Essential hypertension   [facility] [name], [name]   3 months ago Essential hypertension   [facility] Tally Joe T, [name]   5 months ago Hypotension due to hypovolemia   [facility] Tally Joe T, [name]   5 months ago Hypotension due to hypovolemia   [facility] Tally Joe T, [name]   6 months ago Hypotension due to hypovolemia   [facility] [name], [name]       Future Appointments             In 3 weeks Furth, Abram, PA-C [place]   In 2 months Rollene Rotunda, Jaci Standard, [place], [facility]

## 2021-09-25 ENCOUNTER — Telehealth: Payer: Self-pay

## 2021-09-25 ENCOUNTER — Other Ambulatory Visit (INDEPENDENT_AMBULATORY_CARE_PROVIDER_SITE_OTHER): Payer: Medicare Other

## 2021-09-25 ENCOUNTER — Other Ambulatory Visit: Payer: Self-pay

## 2021-09-25 DIAGNOSIS — R0602 Shortness of breath: Secondary | ICD-10-CM | POA: Diagnosis not present

## 2021-09-25 NOTE — Progress Notes (Signed)
Chronic Care Management APPOINTMENT REMINDER   Called [facility], No answer, left message of appointment on 09/28/2021 at 3:45 pm via telephone visit with Junius Argyle , Pharm D. Notified to have all medications, supplements, blood pressure and/or blood sugar logs available during appointment and to return call if need to reschedule.  [place] Pharmacist Assistant [phone]

## 2021-09-26 LAB — BASIC METABOLIC PANEL
BUN/Creatinine Ratio: 16 (ref 10–24)
BUN: 20 mg/dL (ref 8–27)
CO2: 25 mmol/L (ref 20–29)
Calcium: 8.9 mg/dL (ref 8.6–10.2)
Chloride: 108 mmol/L — ABNORMAL HIGH (ref 96–106)
Creatinine, Ser: 1.22 mg/dL (ref 0.76–1.27)
Glucose: 105 mg/dL — ABNORMAL HIGH (ref 70–99)
Potassium: 4.3 mmol/L (ref 3.5–5.2)
Sodium: 144 mmol/L (ref 134–144)
eGFR: 59 mL/min/{1.73_m2} — ABNORMAL LOW (ref >59)

## 2021-09-28 ENCOUNTER — Ambulatory Visit (INDEPENDENT_AMBULATORY_CARE_PROVIDER_SITE_OTHER): Payer: Medicare Other

## 2021-09-28 DIAGNOSIS — K851 Biliary acute pancreatitis without necrosis or infection: Secondary | ICD-10-CM

## 2021-09-28 DIAGNOSIS — G4700 Insomnia, unspecified: Secondary | ICD-10-CM

## 2021-09-28 DIAGNOSIS — I10 Essential (primary) hypertension: Secondary | ICD-10-CM

## 2021-09-28 NOTE — Progress Notes (Signed)
Chronic Care Management Pharmacy Note  09/29/2021 Name:  Khaliq Turay Bertone MRN:  [id number] DOB:  February 17, 1936  Summary: Patient presents for CCM follow-up. Today's visit was conducted 100% with Margie, the patient's spouse   Recommendations/Changes made from today's visit: Continue current medications  Plan: CPP follow-up 6 months  Subjective: Danyal Whitenack Gudger is an 86 y.o. year old male who is a primary patient of [name], [place].  The CCM team was consulted for assistance with disease management and care coordination needs.    Engaged with patient by telephone for follow up visit in response to provider referral for pharmacy case management and/or care coordination services.   Consent to Services:  The patient was given information about Chronic Care Management services, agreed to services, and gave verbal consent prior to initiation of services.  Please see initial visit note for detailed documentation.   Patient Care Team: [name], [name] as PCP - General (Family Medicine) Germaine Pomfret, [facility] (Pharmacist)  Recent office visits: 09/08/21: Patient presented to Tally Joe, [name] for follow-up. BP 132/70.  06/05/2021 [name] (PCP) restart amlodipine 2.5 mg daily,start trazodone 50 mg  AMB Referral to [place], Ambulatory referral to Gastroenterology, Return in about 3 months  04/22/2021 [name] (PCP) Change Pancrelipase to 36,000 units 3 time daily,Return in about 4 weeks   Recent consult visits: 04/01/2021 Dr.Montgomery MD (Trauma Surgery) Iopamidol 61 % injection given,No Medication Changes noted, 01/14/2021 Dr.Stoioff MD (Urology) stop ciprofloxacin, stop sulfamethoxazole-trimethoprim, start Silodosin 8 mg daily 12/18/2020 Dr.Arida MD (Cardiology) No medication Changes noted, follow up in 12 months  Hospital visits: None noted in past 6 months.  Objective:  Lab Results  Component Value Date   CREATININE 1.22 09/25/2021   BUN 20  09/25/2021   GFRNONAA >60 02/27/2021   GFRAA 46 (L) 02/05/2020   NA 144 09/25/2021   K 4.3 09/25/2021   CALCIUM 8.9 09/25/2021   CO2 25 09/25/2021   GLUCOSE 105 (H) 09/25/2021    No results found for: HGBA1C, FRUCTOSAMINE, GFR, MICROALBUR  Last diabetic Eye exam: No results found for: HMDIABEYEEXA  Last diabetic Foot exam: No results found for: HMDIABFOOTEX   Lab Results  Component Value Date   CHOL 125 02/01/2021   HDL 49 02/01/2021   LDLCALC 62 02/01/2021   TRIG 68 02/01/2021   CHOLHDL 2.6 02/01/2021    Hepatic Function Latest Ref Rng & Units 09/08/2021 03/24/2021 02/27/2021  Total Protein 6.0 - 8.5 g/dL 6.5 6.0 6.2(L)  Albumin 3.6 - 4.6 g/dL 3.6 2.9(L) 2.4(L)  AST 0 - 40 IU/L '10 14 30  ' ALT 0 - 44 IU/L '8 9 25  ' Alk Phosphatase 44 - 121 IU/L 101 111 74  Total Bilirubin 0.0 - 1.2 mg/dL 0.5 0.5 1.1  Bilirubin, Direct 0.0 - 0.2 mg/dL - - 0.3(H)    Lab Results  Component Value Date/Time   TSH 7.750 (H) 09/08/2021 10:33 AM   TSH 5.170 (H) 01/02/2021 08:09 AM   FREET4 1.06 09/08/2021 10:33 AM    CBC Latest Ref Rng & Units 09/08/2021 03/24/2021 02/27/2021  WBC 3.4 - 10.8 x10E3/uL 5.4 5.9 6.5  Hemoglobin 13.0 - 17.7 g/dL 10.0(L) 9.5(L) 9.4(L)  Hematocrit 37.5 - 51.0 % 32.9(L) 29.8(L) 29.5(L)  Platelets 150 - 450 x10E3/uL 234 224 243    No results found for: VD25OH  Clinical ASCVD: Yes  The ASCVD Risk score (Arnett DK, et al., 2019) failed to calculate for the following reasons:   The  2019 ASCVD risk score is only valid for ages 63 to 54   The patient has a prior MI or stroke diagnosis    Depression screen [facility] 2/9 04/18/2021 02/05/2020 06/20/2017  Decreased Interest 0 1 0  Down, Depressed, Hopeless 0 0 0  PHQ - 2 Score 0 1 0  Altered sleeping - 1 0  Tired, decreased energy - 1 0  Change in appetite - 0 0  Feeling bad or failure about yourself  - 0 0  Trouble concentrating - 0 0  Moving slowly or fidgety/restless - 0 0  Suicidal thoughts - 0 0  PHQ-9 Score - 3 0   Difficult doing work/chores - Not difficult at all Not difficult at all    Social History   Tobacco Use  Smoking Status Never  Smokeless Tobacco Never   BP Readings from Last 3 Encounters:  09/17/21 108/60  09/17/21 121/69  09/08/21 132/70   Pulse Readings from Last 3 Encounters:  09/17/21 77  09/17/21 73  09/08/21 64   Wt Readings from Last 3 Encounters:  09/17/21 176 lb (79.8 kg)  09/17/21 174 lb 3.2 oz (79 kg)  06/05/21 175 lb 1.6 oz (79.4 kg)   BMI Readings from Last 3 Encounters:  09/17/21 25.25 kg/m  09/17/21 25.00 kg/m  06/05/21 25.86 kg/m    Assessment/Interventions: Review of patient past medical history, allergies, medications, health status, including review of consultants reports, laboratory and other test data, was performed as part of comprehensive evaluation and provision of chronic care management services.   SDOH:  (Social Determinants of Health) assessments and interventions performed: Yes SDOH Interventions    Flowsheet Row Most Recent Value  SDOH Interventions   Financial Strain Interventions Intervention Not Indicated       SDOH Screenings   Alcohol Screen: Low Risk    Last Alcohol Screening Score (AUDIT): 0  Depression (PHQ2-9): Low Risk    PHQ-2 Score: 0  Financial Resource Strain: Low Risk    Difficulty of Paying Living Expenses: Not hard at all  Food Insecurity: No Food Insecurity   Worried About Charity fundraiser in the Last Year: Never true   Ran Out of Food in the Last Year: Never true  Housing: Low Risk    Last Housing Risk Score: 0  Physical Activity: Insufficiently Active   Days of Exercise per Week: 1 day   Minutes of Exercise per Session: 10 min  Social Connections: Unknown   Frequency of Communication with Friends and Family: More than three times a week   Frequency of Social Gatherings with Friends and Family: Patient refused   Attends Religious Services: Not on Higher education careers adviser of Clubs or Organizations: No    Attends Archivist Meetings: Never   Marital Status: Married  Stress: No Stress Concern Present   Feeling of Stress : Only a little  Tobacco Use: Low Risk    Smoking Tobacco Use: Never   Smokeless Tobacco Use: Never   Passive Exposure: Not on file  Transportation Needs: No Transportation Needs   Lack of Transportation (Medical): No   Lack of Transportation (Non-Medical): No    CCM Care Plan  Allergies  Allergen Reactions   Imodium [Loperamide] Nausea And Vomiting   Hydroxyzine Hcl     Other reaction(s): Other (See Comments) Sedation and myoclonic jerks of all four extremities. Noted 02/19/21.     Medications Reviewed Today     Reviewed by Germaine Pomfret, [facility] (Pharmacist) on 09/29/21  at 1150  Med List Status: <None>   Medication Order Taking? Sig Documenting Provider Last Dose Status Informant  acetaminophen (TYLENOL) 500 MG tablet [id number] No Take 500 mg by mouth every 6 (six) hours as needed. [provider] Taking Active   amLODipine (NORVASC) 2.5 MG tablet [id number] No TAKE 1 TABLET(2.5 MG) BY MOUTH DAILY [name], [name] Taking Active   aspirin 81 MG tablet [id number] No Take 81 mg by mouth daily. [provider] Taking Active Self  atorvastatin (LIPITOR) 20 MG tablet [id number] No TAKE 1 TABLET(20 MG) BY MOUTH DAILY AT 6 PM Chrismon, Vickki Muff, PA-C Taking Active   calcium carbonate (TUMS - DOSED IN MG ELEMENTAL CALCIUM) 500 MG chewable tablet [id number] No Chew 1 tablet by mouth 2 (two) times daily as needed for indigestion or heartburn. [provider] Taking Active   carvedilol (COREG) 12.5 MG tablet [id number] No TAKE 1 TABLET(12.5 MG) BY MOUTH TWICE DAILY Chrismon, Vickki Muff, PA-C Taking Active   furosemide (LASIX) 20 MG tablet [id number]  Take 1 tablet (20 mg total) by mouth daily. Wellington Hampshire, MD  Active   lipase/protease/amylase (CREON) 36000 UNITS CPEP capsule [id number] No Take 1 capsule (36,000 Units total) by mouth 3  (three) times daily with meals. [name], [name] Taking Active   Discontinued 10/15/11 1520 (Change in therapy)   nitroGLYCERIN (NITROSTAT) 0.4 MG SL tablet [id number] No Place 0.4 mg under the tongue every 5 (five) minutes as needed. [provider] Taking Active Self  omeprazole (PRILOSEC) 20 MG capsule [id number] No Take 2 capsules (40 mg total) by mouth daily. [name], [name] Taking Active   Discontinued 10/20/11 1527 (Change in therapy)   tamsulosin (FLOMAX) 0.4 MG CAPS capsule [id number] No TAKE 1 CAPSULE BY MOUTH EVERY DAY Chrismon, Vickki Muff, PA-C Taking Active   Thiamine HCl (B-1) 100 MG TABS [id number] No TAKE 1 TABLET BY MOUTH EVERY DAY Chrismon, Vickki Muff, PA-C Taking Active   traZODone (DESYREL) 50 MG tablet [id number]  TAKE 1/2 TO 1 TABLET(25 TO 50 MG) BY MOUTH AT BEDTIME AS NEEDED FOR SLEEP [name], [name]  Active             Patient Active Problem List   Diagnosis Date Noted   PND (paroxysmal nocturnal dyspnea) 09/08/2021   Orthopnea 09/08/2021   Venous insufficiency of right leg 09/08/2021   Screening for prostate cancer 09/08/2021   Elevated TSH 09/08/2021   Iron deficiency anemia secondary to inadequate dietary iron intake 09/08/2021   Insomnia 06/05/2021   Exercise intolerance 06/05/2021   Constipation 03/31/2021   Hypotension due to hypovolemia 03/24/2021   Dehydration 03/24/2021   Stage 2 chronic kidney disease 02/20/2021   Lactic acidosis 02/20/2021   Acute respiratory failure with hypoxia ([place]) 02/20/2021   Gallstone pancreatitis    Shortness of breath    Gallstones    Enteritis    Acute pancreatitis 02/01/2021   Narrowing of intervertebral disc space 06/18/2016   Elevated prostate specific antigen (PSA) 06/18/2016   Benign prostatic hyperplasia without lower urinary tract symptoms 06/18/2016   HLD (hyperlipidemia)    Mixed hyperlipidemia    CAD (coronary artery disease)    Coronary artery disease involving native coronary artery of native  heart with angina pectoris with documented spasm (HCC)    NSTEMI (non-ST elevated myocardial infarction) ([place]) 03/19/2016   HTN (hypertension)    High blood cholesterol level    Acid reflux    Hyperlipidemia 12/27/2008   Essential  hypertension 12/27/2008   Coronary atherosclerosis 12/27/2008   Aortic valve disorder 12/27/2008   Esophagitis, reflux 06/06/2006   Hypertensive heart disease 08/10/2003    Immunization History  Administered Date(s) Administered   Fluad Quad(high Dose 65+) 06/05/2021   Influenza, High Dose Seasonal PF 06/21/2019   PFIZER Comirnaty(Gray Top)Covid-19 Tri-Sucrose Vaccine 09/10/2019, 10/01/2019, 02/25/2021   PFIZER(Purple Top)SARS-COV-2 Vaccination 09/10/2019, 10/01/2019   Pneumococcal Conjugate-13 02/05/2020    Conditions to be addressed/monitored:  Hypertension, Hyperlipidemia, Coronary Artery Disease, GERD, BPH, and History of NSTEMI  Care Plan : General Pharmacy (Adult)  Updates made by Germaine Pomfret, [place] since 09/29/2021 12:00 AM     Problem: Hypertension, Hyperlipidemia, Coronary Artery Disease, GERD, BPH, and History of NSTEMI   Priority: High     Long-Range Goal: Patient-Specific Goal   Start Date: 06/18/2021  Expected End Date: 06/18/2022  Recent Progress: On track  Priority: High  Note:   Current Barriers:  Unable to independently afford treatment regimen  Pharmacist Clinical Goal(s):  Patient will maintain control of blood pressure as evidenced by BP less than 140/90  through collaboration with PharmD and provider.   Interventions: 1:1 collaboration with [name], [name] regarding development and update of comprehensive plan of care as evidenced by provider attestation and co-signature Inter-disciplinary care team collaboration (see longitudinal plan of care) Comprehensive medication review performed; medication list updated in electronic medical record  Hypertension (BP goal <140/90) -Controlled -Current  treatment: Amlodipine 2.5 mg daily: Appropriate, Effective, Safe, Accessible  Carvedilol 12.5 mg twice daily: Appropriate, Effective, Safe, Accessible  Furosemide 20 mg daily: Appropriate, Effective, Safe, Accessible  -Medications previously tried: NA  -Disability in right foot limits balance.  -Current home readings:  128/83  136/68 120/70 123/67 115/66 111/66 -Denies hypotensive/hypertensive symptoms -Recommended to continue current medication  Hyperlipidemia: (LDL goal < 70) -Controlled -History of NSTEMI x3 (2000) -Current treatment: Atorvastatin 20 mg daily  -Medications previously tried: NA  -Recommended to continue current medication  Reflux (Goal: Minimize symptoms) -Controlled -Current treatment  Omeprazole 2 capsules daily before breakfast -Medications previously tried: NA  -Recommended to continue current medication  BPH (Goal: Minimize urinary symptoms) -Controlled -Current treatment  Tamsulosin 0.4 mg daily (HS)  -Medications previously tried: NA  -Recommended to continue current medication  Insomnia (Goal: Improve sleep quality) -Controlled -Current treatment  Trazodone 50 mg 0.5-1 tablets nightly as needed: Appropriate, Effective, Safe, Accessible  -Medications previously tried: Melatonin  -No benefit noted from trazodone  -Typically wakes up around 3 am, sleeps off and on until 6:30. Patient wife reports significant improvement since starting trazodone.   -Patient would likely benefit from work-up of anxiety symptoms.   Pancreatitis (Goal: Minimize symptoms) -Controlled -Current treatment  Creon 360,000 units three times daily with meals: Appropriate, Effective, Safe, Accessible -Medications previously tried: NA  -Patient assistance approved, patient has not any any stomach pain with meals since starting this medication.  -Recommended to continue current medication   Patient Goals/Self-Care Activities Patient will:  - check blood pressure 2-3  times weekly, document, and provide at future appointments  Follow Up Plan: Telephone follow up appointment with care management team member scheduled for:  03/29/2022 at 3:45 PM     Medication Assistance:  Creon obtained through [facility] medication assistance program.  Enrollment ends Dec 2023  Compliance/Adherence/Medication fill history: Care Gaps: Tetanus Vaccine Shingrix Vaccine Pneumonia Vaccine (Last Completed 02/05/2020) Influenza Vaccine Covid-19 Vaccine (4- Booster for [place] Series)  Star-Rating Drugs: Atorvastatin 20 mg  last filled 07/18/2021 90 day supply at  [facility].  Patient's preferred pharmacy is:  [facility] [place], [place] - [place] [address] [place] [address] Phone: [phone] Fax: [phone]   Uses pill box? No - too much work to set up Pt endorses 100% compliance  We discussed: Current pharmacy is preferred with insurance plan and patient is satisfied with pharmacy services Patient decided to: Continue current medication management strategy  Care Plan and Follow Up Patient Decision:  Patient agrees to Care Plan and Follow-up.  Plan: Telephone follow up appointment with care management team member scheduled for:  03/29/2022 at 3:45 PM  Junius Argyle, PharmD, [place], [place] [phone]

## 2021-09-29 ENCOUNTER — Ambulatory Visit: Payer: Medicare Other | Admitting: Podiatry

## 2021-09-29 ENCOUNTER — Encounter: Payer: Self-pay | Admitting: Podiatry

## 2021-09-29 ENCOUNTER — Other Ambulatory Visit: Payer: Self-pay

## 2021-09-29 DIAGNOSIS — M79675 Pain in left toe(s): Secondary | ICD-10-CM

## 2021-09-29 DIAGNOSIS — B351 Tinea unguium: Secondary | ICD-10-CM

## 2021-09-29 DIAGNOSIS — M79674 Pain in right toe(s): Secondary | ICD-10-CM | POA: Diagnosis not present

## 2021-09-29 NOTE — Progress Notes (Signed)
° °  SUBJECTIVE Patient presents to office today complaining of elongated, thickened nails that cause pain while ambulating in shoes.  Patient is unable to trim their own nails. Patient is here for further evaluation and treatment.  Past Medical History:  Diagnosis Date   Acid reflux    Aortic insufficiency    a. 03/2016 Echo: EF 55-60%, no rwma, Gr1 DD, mild to mod AI, mild TR.   CAD (coronary artery disease)    a. 2000 Cath: Severe distal LAD disease with left to left collaterals;  b. 2008 MV: non-ischemic; c. 03/2016 Cath: LM nl, LAD 30p, D1/D2 mod, nl, LCX nl, OM1/2 nl w/ L-->L collats to apical LAD, RCA dominant, nl. EF 55-65%.   Hypertensive heart disease 2005   unspecified   Mixed hyperlipidemia    mixed    OBJECTIVE General Patient is awake, alert, and oriented x 3 and in no acute distress. Derm Skin is dry and supple bilateral. Negative open lesions or macerations. Remaining integument unremarkable. Nails are tender, long, thickened and dystrophic with subungual debris, consistent with onychomycosis, 1-5 bilateral. No signs of infection noted. Vasc  DP and PT pedal pulses palpable bilaterally. Temperature gradient within normal limits.  Neuro Epicritic and protective threshold sensation grossly intact bilaterally.  Musculoskeletal Exam No symptomatic pedal deformities noted bilateral. Muscular strength within normal limits.  ASSESSMENT 1.  Pain due to onychomycosis of toenails both  PLAN OF CARE 1. Patient evaluated today.  2. Instructed to maintain good pedal hygiene and foot care.  3. Mechanical debridement of nails 1-5 bilaterally performed using a nail nipper. Filed with dremel without incident.  4. Return to clinic in 3 mos.    Edrick Kins, DPM [facility]  Dr. Edrick Kins, DPM    [address], [place] [address]                Office [phone]  Fax [phone]

## 2021-09-29 NOTE — Patient Instructions (Signed)
Visit Information It was great speaking with you today!  Please let me know if you have any questions about our visit.   Goals Addressed   None     Patient Care Plan: General Pharmacy (Adult)     Problem Identified: Hypertension, Hyperlipidemia, Coronary Artery Disease, GERD, BPH, and History of NSTEMI   Priority: High     Long-Range Goal: Patient-Specific Goal   Start Date: 06/18/2021  Expected End Date: 06/18/2022  This Visit's Progress: On track  Priority: High  Note:   Current Barriers:  Unable to independently afford treatment regimen  Pharmacist Clinical Goal(s):  Patient will maintain control of blood pressure as evidenced by BP less than 140/90  through collaboration with PharmD and provider.   Interventions: 1:1 collaboration with [name], [name] regarding development and update of comprehensive plan of care as evidenced by provider attestation and co-signature Inter-disciplinary care team collaboration (see longitudinal plan of care) Comprehensive medication review performed; medication list updated in electronic medical record  Hypertension (BP goal <140/90) -Controlled -Current treatment: Amlodipine 2.5 mg daily  Carvedilol 12.5 mg twice daily  -Medications previously tried: NA  -does report swelling in feet if not elevating them throughout the day -Current home readings: 126/74 -Denies hypotensive/hypertensive symptoms -Recommended to continue current medication  Hyperlipidemia: (LDL goal < 70) -Controlled -History of NSTEMI x3 (2000) -Current treatment: Atorvastatin 20 mg daily  -Medications previously tried: NA  -Recommended to continue current medication  Reflux (Goal: Minimize symptoms) -Controlled -Current treatment  Omeprazole 2 capsules daily before breakfast -Medications previously tried: NA  -Recommended to continue current medication  BPH (Goal: Minimize urinary symptoms) -Controlled -Current treatment  Tamsulosin 0.4 mg daily  (HS)  -Medications previously tried: NA  -Recommended to continue current medication  Sleep (Goal: Improve sleep quality) -Uncontrolled -Current treatment  Trazodone 50 mg 0.5-1 tablets nightly as needed  -Medications previously tried: NA  -No benefit noted from trazodone  -Notices behavioral disturbances at nighttime. Patient wife thinks poor sleep is related to anxiety symptoms or pain rather than insomnia  -Recommend retrying trazodone, if ineffective can try melatonin 1 mg nightly for one month.  -Patient would likely benefit from work-up of anxiety symptoms.   Patient Goals/Self-Care Activities Patient will:  - check blood pressure 2-3 times weekly, document, and provide at future appointments  Follow Up Plan: Telephone follow up appointment with care management team member scheduled for:  09/14/2021 at 3:00 PM      Patient agreed to services and verbal consent obtained.   Patient verbalizes understanding of instructions and care plan provided today and agrees to view in [place]. Active MyChart status confirmed with patient.    Junius Argyle, PharmD, [place], CPP  Clinical Pharmacist Practitioner  [facility] [phone]

## 2021-10-04 ENCOUNTER — Other Ambulatory Visit: Payer: Self-pay | Admitting: Family Medicine

## 2021-10-04 DIAGNOSIS — K219 Gastro-esophageal reflux disease without esophagitis: Secondary | ICD-10-CM

## 2021-10-05 ENCOUNTER — Other Ambulatory Visit: Payer: Self-pay

## 2021-10-05 ENCOUNTER — Telehealth: Payer: Self-pay | Admitting: Family Medicine

## 2021-10-05 NOTE — Telephone Encounter (Signed)
Requested Prescriptions  Pending Prescriptions Disp Refills   omeprazole (PRILOSEC) 20 MG capsule [Pharmacy Med Name: OMEPRAZOLE 20MG  CAPSULES] 180 capsule 0    Sig: TAKE 2 CAPSULES(40 MG) BY MOUTH DAILY     Gastroenterology: Proton Pump Inhibitors Passed - 10/04/2021  3:28 AM      Passed - Valid encounter within last 12 months    Recent Outpatient Visits          3 weeks ago Essential hypertension   [facility] [name], [name]   4 months ago Essential hypertension   [facility] Tally Joe T, [name]   5 months ago Hypotension due to hypovolemia   [facility] [name], [name]   6 months ago Hypotension due to hypovolemia   [facility] [name], [name]   6 months ago Hypotension due to hypovolemia   [facility] [name], [name]      Future Appointments            In 1 week [place], [place], PA-C [place]   In 2 months Rollene Rotunda, Jaci Standard, [place], [facility]

## 2021-10-05 NOTE — Telephone Encounter (Signed)
[facility] faxed refill request for the following medications:   tamsulosin (FLOMAX) 0.4 MG CAPS capsule   Please advise  

## 2021-10-06 DIAGNOSIS — I10 Essential (primary) hypertension: Secondary | ICD-10-CM

## 2021-10-06 MED ORDER — TAMSULOSIN HCL 0.4 MG PO CAPS: 0.4000 mg | ORAL_CAPSULE | Freq: Every day | ORAL | 1 refills | Status: DC

## 2021-10-09 ENCOUNTER — Ambulatory Visit (INDEPENDENT_AMBULATORY_CARE_PROVIDER_SITE_OTHER): Payer: Medicare Other

## 2021-10-09 ENCOUNTER — Other Ambulatory Visit: Payer: Self-pay

## 2021-10-09 DIAGNOSIS — R0602 Shortness of breath: Secondary | ICD-10-CM

## 2021-10-09 LAB — ECHOCARDIOGRAM COMPLETE
AR max vel: 2.92 cm2
AV Area VTI: 2.96 cm2
AV Area mean vel: 2.8 cm2
AV Mean grad: 4 mmHg
AV Peak grad: 7.2 mmHg
AV Vena cont: 0.6 cm
Ao pk vel: 1.34 m/s
Area-P 1/2: 2.41 cm2
Calc EF: 64.8 %
P 1/2 time: 604 msec
S' Lateral: 4.2 cm
Single Plane A2C EF: 69 %
Single Plane A4C EF: 63.8 %

## 2021-10-13 ENCOUNTER — Encounter: Payer: Self-pay | Admitting: Surgery

## 2021-10-13 ENCOUNTER — Other Ambulatory Visit: Payer: Self-pay

## 2021-10-13 ENCOUNTER — Ambulatory Visit: Payer: Medicare Other | Admitting: Surgery

## 2021-10-13 VITALS — BP 132/74 | HR 73 | Temp 98.2°F | Ht 70.0 in | Wt 164.6 lb

## 2021-10-13 DIAGNOSIS — K8020 Calculus of gallbladder without cholecystitis without obstruction: Secondary | ICD-10-CM

## 2021-10-13 NOTE — Patient Instructions (Addendum)
If you have any concerns or questions, please feel free to call our office. See follow up appointment below.   Cholelithiasis Cholelithiasis happens when gallstones form in the gallbladder. The gallbladder stores bile. Bile is a fluid that helps digest fats. Bile can harden and form into gallstones. If they cause a blockage, they can cause pain (gallbladder attack). What are the causes? This condition may be caused by: Some blood diseases, such as sickle cell anemia. Too much of a fat-like substance (cholesterol) in your bile. Not enough bile salts in your bile. These salts help the body absorb and digest fats. The gallbladder not emptying fully or often enough. This is common in pregnant women. What increases the risk? The following factors may make you more likely to develop this condition: Being male. Being pregnant many times. Eating a lot of fried foods, fat, and refined carbs (refined carbohydrates). Being very overweight (obese). Being older than age 1. Using medicines with male hormones in them for a long time. Losing weight fast. Having gallstones in your family. Having some health problems, such as diabetes, Crohn's disease, or liver disease. What are the signs or symptoms? Often, there may be gallstones but no symptoms. These gallstones are called silent gallstones. If a gallstone causes a blockage, you may get sudden pain. The pain: Can be in the upper right part of your belly (abdomen). Normally comes at night or after you eat. Can last an hour or more. Can spread to your right shoulder, back, or chest. Can feel like discomfort, burning, or fullness in the upper part of your belly (indigestion). If the blockage lasts more than a few hours, you can get an infection or swelling. You may: Feel like you may vomit. Vomit. Feel bloated. Have belly pain for 5 hours or more. Feel tender in your belly, often in the upper right part and under your ribs. Have fever or  chills. Have skin or the white parts of your eyes turn yellow (jaundice). Have dark pee (urine) or pale poop (stool). How is this treated? Treatment for this condition depends on how bad you feel. If you have symptoms, you may need: Home care, if symptoms are not very bad. Do not eat for 12-24 hours. Drink only water and clear liquids. Start to eat simple or clear foods after 1 or 2 days. Try broths and crackers. You may need medicines for pain or stomach upset or both. If you have an infection, you will need antibiotics. A hospital stay, if you have very bad pain or a very bad infection. Surgery to remove your gallbladder. You may need this if: Gallstones keep coming back. You have very bad symptoms. Medicines to break up gallstones. Medicines: Are best for small gallstones. May be used for up to 6-12 months. A procedure to find and take out gallstones or to break up gallstones. Follow these instructions at home: Medicines Take over-the-counter and prescription medicines only as told by your doctor. If you were prescribed an antibiotic medicine, take it as told by your doctor. Do not stop taking the antibiotic even if you start to feel better. Ask your doctor if the medicine prescribed to you requires you to avoid driving or using machinery. Eating and drinking Drink enough fluid to keep your urine pale yellow. Drink water or clear fluids. This is important when you have pain. Eat healthy foods. Choose: Fewer fatty foods, such as fried foods. Fewer refined carbs. Avoid breads and grains that are highly processed, such as white  bread and white rice. Choose whole grains, such as whole-wheat bread and brown rice. More fiber. Almonds, fresh fruit, and beans are healthy sources. General instructions Keep a healthy weight. Keep all follow-up visits as told by your doctor. This is important. Where to find more information [employer] of Diabetes and Digestive and Kidney Diseases:  [email or web address] Contact a doctor if: You have sudden pain in the upper right part of your belly. Pain might spread to your right shoulder, back, or chest. You have been diagnosed with gallstones that have no symptoms and you get: Belly pain. Discomfort, burning, or fullness in the upper part of your abdomen. You have dark urine or pale stools. Get help right away if: You have sudden pain in the upper right part of your abdomen, and the pain lasts more than 2 hours. You have pain in your abdomen, and: It lasts more than 5 hours. It keeps getting worse. You have a fever or chills. You keep feeling like you may vomit. You keep vomiting. Your skin or the white parts of your eyes turn yellow. Summary Cholelithiasis happens when gallstones form in the gallbladder. This condition may be caused by a blood disease, too much of a fat-like substance in the bile, or not enough bile salts in bile. Treatment for this condition depends on how bad you feel. If you have symptoms, do not eat or drink. You may need medicines. You may need a hospital stay for very bad pain or a very bad infection. You may need surgery if gallstones keep coming back or if you have very bad symptoms. This information is not intended to replace advice given to you by your health care provider. Make sure you discuss any questions you have with your health care provider.  Gallbladder Eating Plan If you have a gallbladder condition, you may have trouble digesting fats. Eating a low-fat diet can help reduce your symptoms, and may be helpful before and after having surgery to remove your gallbladder (cholecystectomy). Your health care provider may recommend that you work with a diet and nutrition specialist (dietitian) to help you reduce the amount of fat in your diet. What are tips for following this plan? General guidelines Limit your fat intake to less than 30% of your total daily calories. If you eat around 1,800 calories each  day, this is less than 60 grams (g) of fat per day. Fat is an important part of a healthy diet. Eating a low-fat diet can make it hard to maintain a healthy body weight. Ask your dietitian how much fat, calories, and other nutrients you need each day. Eat small, frequent meals throughout the day instead of three large meals. Drink at least 8-10 cups of fluid a day. Drink enough fluid to keep your urine clear or pale yellow. Limit alcohol intake to no more than 1 drink a day for nonpregnant women and 2 drinks a day for men. One drink equals 12 oz of beer, 5 oz of wine, or 1 oz of hard liquor. Reading food labels  Check Nutrition Facts on food labels for the amount of fat per serving. Choose foods with less than 3 grams of fat per serving. Shopping Choose nonfat and low-fat healthy foods. Look for the words "nonfat," "low fat," or "fat free." Avoid buying processed or prepackaged foods. Cooking Cook using low-fat methods, such as baking, broiling, grilling, or boiling. Cook with small amounts of healthy fats, such as olive oil, grapeseed oil, canola oil, or sunflower oil. What  foods are recommended? All fresh, frozen, or canned fruits and vegetables. Whole grains. Low-fat or non-fat (skim) milk and yogurt. Lean meat, skinless poultry, fish, eggs, and beans. Low-fat protein supplement powders or drinks. Spices and herbs. What foods are not recommended? High-fat foods. These include baked goods, fast food, fatty cuts of meat, ice cream, french toast, sweet rolls, pizza, cheese bread, foods covered with butter, creamy sauces, or cheese. Fried foods. These include french fries, tempura, battered fish, breaded chicken, fried breads, and sweets. Foods with strong odors. Foods that cause bloating and gas. Summary A low-fat diet can be helpful if you have a gallbladder condition, or before and after gallbladder surgery. Limit your fat intake to less than 30% of your total daily calories. This is  about 60 g of fat if you eat 1,800 calories each day. Eat small, frequent meals throughout the day instead of three large meals. This information is not intended to replace advice given to you by your health care provider. Make sure you discuss any questions you have with your health care provider. Document Revised: 03/07/2020 Document Reviewed: 03/13/2020 Elsevier Patient Education  2022 [employer].

## 2021-10-13 NOTE — Progress Notes (Signed)
Patient ID: James Mora, male   DOB: 1936-01-25, 86 y.o.   MRN: [id number]  Chief Complaint: History of gallstone pancreatitis  History of Present Illness Since her last visit Mr. Askren been doing very well, apparently watching his diet well denying any new right upper quadrant pain, nausea or vomiting.  He has undergone evaluation by his cardiologist including echocardiogram.  Currently he appears to be tolerating diet well without any evidence of biliary tract pathology, despite the known centimeter gallstone present. He is been slowly regaining his health/strength since his discharge from rehab.  I believe his tolerance is still quite fragile. Dondre Catalfamo Wiemers is a 86 y.o. male with history of quite severe gallstone pancreatitis last summer.  Apparently this took such a tremendous toll on him that it required a prolonged hospitalization, along with rehab.  He is currently at home and continuing to make progress in regaining his strength.  It seems he feels somewhat threatened by the chance that he could have a relapse or recurrence of his gallstone pancreatitis.  He is currently taking Creon for apparently a degree of pancreatic insufficiency.  He currently denies any abdominal pain, reports tolerating a diet well without postprandial pain, nausea or vomiting.  He denies any recent history of jaundice, fevers or chills.  He did report that he typically sleeps upright in a chair, stating he gets short of breath when laying supine.  However his wife indicates that he may have had a panic attack at one time when laid repositioned in a steep Trendelenburg position, and this is the source of his shortness of breath.  He is scheduled to see his cardiologist today.  And we will look to them for guidance regarding anesthetic risk for surgery.  Past Medical History Past Medical History:  Diagnosis Date   Acid reflux    Aortic insufficiency    a. 03/2016 Echo: EF 55-60%, no rwma, Gr1 DD, mild to mod AI, mild TR.    CAD (coronary artery disease)    a. 2000 Cath: Severe distal LAD disease with left to left collaterals;  b. 2008 MV: non-ischemic; c. 03/2016 Cath: LM nl, LAD 30p, D1/D2 mod, nl, LCX nl, OM1/2 nl w/ L-->L collats to apical LAD, RCA dominant, nl. EF 55-65%.   Hypertensive heart disease 2005   unspecified   Mixed hyperlipidemia    mixed      Past Surgical History:  Procedure Laterality Date   CARDIAC CATHETERIZATION  2000   left heart   CARDIAC CATHETERIZATION N/A 03/22/2016   Procedure: Left Heart Cath And Coronary Angiography;  Surgeon: Minna Merritts, MD;  Location: [facility];  Service: Cardiovascular;  Laterality: N/A;   COLONOSCOPY  2008   Dr. Allen Norris    Allergies  Allergen Reactions   Imodium [Loperamide] Nausea And Vomiting   Hydroxyzine Hcl     Other reaction(s): Other (See Comments) Sedation and myoclonic jerks of all four extremities. Noted 02/19/21.     Current Outpatient Medications  Medication Sig Dispense Refill   acetaminophen (TYLENOL) 500 MG tablet Take 500 mg by mouth every 6 (six) hours as needed.     amLODipine (NORVASC) 2.5 MG tablet TAKE 1 TABLET(2.5 MG) BY MOUTH DAILY 90 tablet 0   aspirin 81 MG tablet Take 81 mg by mouth daily.     atorvastatin (LIPITOR) 20 MG tablet TAKE 1 TABLET(20 MG) BY MOUTH DAILY AT 6 PM 90 tablet 3   calcium carbonate (TUMS - DOSED IN MG ELEMENTAL CALCIUM)  500 MG chewable tablet Chew 1 tablet by mouth 2 (two) times daily as needed for indigestion or heartburn.     carvedilol (COREG) 12.5 MG tablet TAKE 1 TABLET(12.5 MG) BY MOUTH TWICE DAILY 180 tablet 3   furosemide (LASIX) 20 MG tablet Take 1 tablet (20 mg total) by mouth daily. 30 tablet 2   lipase/protease/amylase (CREON) 36000 UNITS CPEP capsule Take 1 capsule (36,000 Units total) by mouth 3 (three) times daily with meals. 270 capsule 1   nitroGLYCERIN (NITROSTAT) 0.4 MG SL tablet Place 0.4 mg under the tongue every 5 (five) minutes as needed.     omeprazole (PRILOSEC)  20 MG capsule TAKE 2 CAPSULES(40 MG) BY MOUTH DAILY 180 capsule 0   tamsulosin (FLOMAX) 0.4 MG CAPS capsule Take 1 capsule (0.4 mg total) by mouth daily. 90 capsule 1   Thiamine HCl (B-1) 100 MG TABS TAKE 1 TABLET BY MOUTH EVERY DAY 90 tablet 3   traZODone (DESYREL) 50 MG tablet TAKE 1/2 TO 1 TABLET(25 TO 50 MG) BY MOUTH AT BEDTIME AS NEEDED FOR SLEEP 30 tablet 3   No current facility-administered medications for this visit.    Family History Family History  Family history unknown: Yes      Social History Social History   Tobacco Use   Smoking status: Never   Smokeless tobacco: Never  Vaping Use   Vaping Use: Never used  Substance Use Topics   Alcohol use: No   Drug use: No       Review of Systems  Constitutional:  Positive for weight loss.  HENT:  Positive for hearing loss.   Eyes: Negative.   Respiratory:  Positive for cough and shortness of breath.   Cardiovascular:  Positive for orthopnea, claudication and leg swelling.  Gastrointestinal:  Negative for abdominal pain, blood in stool, constipation, diarrhea, heartburn, melena, nausea and vomiting.  Genitourinary: Negative.   Skin: Negative.   Neurological: Negative.   Psychiatric/Behavioral:  Positive for depression.      Physical Exam Blood pressure 132/74, pulse 73, temperature 98.2 F (36.8 C), temperature source Oral, height '5\' 10"'$  (1.778 m), weight 164 lb 9.6 oz (74.7 kg), SpO2 90 %. Last Weight  Most recent update: 10/13/2021  8:51 AM    Weight  74.7 kg (164 lb 9.6 oz)             CONSTITUTIONAL: Well developed, and nourished, appropriately responsive and aware without distress.  He appears well. EYES: Sclera non-icteric.   EARS, NOSE, MOUTH AND THROAT: Mask worn.    Hearing is intact to voice.  NECK: Trachea is midline, and there is no jugular venous distension.  LYMPH NODES:  Lymph nodes in the neck are not enlarged. RESPIRATORY:  Lungs are clear, and breath sounds are equal bilaterally. Normal  respiratory effort without pathologic use of accessory muscles. CARDIOVASCULAR: Heart is regular in rate. GI: The abdomen is rounded and soft, nontender. There were no palpable masses. I did not appreciate hepatosplenomegaly.  Particularly no right upper quadrant tenderness on deep palpation. MUSCULOSKELETAL: No appreciable bony deformities, muscle mass diminished.    SKIN: Skin turgor is normal. No pathologic skin lesions appreciated.  NEUROLOGIC:  Motor and sensation appear grossly normal.  Cranial nerves are grossly without defect. PSYCH:  Alert and oriented to person, place and time. Affect is appropriate for situation.  Data Reviewed I have personally reviewed what is currently available of the patient's imaging, recent labs and medical records.   Labs:  CBC Latest Ref Rng &  Units 09/08/2021 03/24/2021 02/27/2021  WBC 3.4 - 10.8 x10E3/uL 5.4 5.9 6.5  Hemoglobin 13.0 - 17.7 g/dL 10.0(L) 9.5(L) 9.4(L)  Hematocrit 37.5 - 51.0 % 32.9(L) 29.8(L) 29.5(L)  Platelets 150 - 450 x10E3/uL 234 224 243   CMP Latest Ref Rng & Units 09/25/2021 09/08/2021 03/24/2021  Glucose 70 - 99 mg/dL 105(H) 105(H) 122(H)  BUN 8 - 27 mg/dL '20 20 23  '$ Creatinine 0.76 - 1.27 mg/dL 1.22 1.18 1.32(H)  Sodium 134 - 144 mmol/L 144 143 142  Potassium 3.5 - 5.2 mmol/L 4.3 4.3 3.9  Chloride 96 - 106 mmol/L 108(H) 106 105  CO2 20 - 29 mmol/L '25 23 24  '$ Calcium 8.6 - 10.2 mg/dL 8.9 8.8 8.7  Total Protein 6.0 - 8.5 g/dL - 6.5 6.0  Total Bilirubin 0.0 - 1.2 mg/dL - 0.5 0.5  Alkaline Phos 44 - 121 IU/L - 101 111  AST 0 - 40 IU/L - 10 14  ALT 0 - 44 IU/L - 8 9      Imaging: Radiology review: MRCP and ultrasound reports reviewed from June/July 2022.  Within last 24 hrs: No results found.  Assessment    History of gallstone pancreatitis, severe and debilitating. Last ultrasound showed residual centimeter stone, without common bile duct abnormality. Last LFTs noted above. Patient Active Problem List   Diagnosis Date  Noted   PND (paroxysmal nocturnal dyspnea) 09/08/2021   Orthopnea 09/08/2021   Venous insufficiency of right leg 09/08/2021   Screening for prostate cancer 09/08/2021   Elevated TSH 09/08/2021   Iron deficiency anemia secondary to inadequate dietary iron intake 09/08/2021   Insomnia 06/05/2021   Exercise intolerance 06/05/2021   Constipation 03/31/2021   Hypotension due to hypovolemia 03/24/2021   Dehydration 03/24/2021   Stage 2 chronic kidney disease 02/20/2021   Lactic acidosis 02/20/2021   Acute respiratory failure with hypoxia ([place]) 02/20/2021   Gallstone pancreatitis    Shortness of breath    Gallstones    Enteritis    Acute pancreatitis 02/01/2021   Narrowing of intervertebral disc space 06/18/2016   Elevated prostate specific antigen (PSA) 06/18/2016   Benign prostatic hyperplasia without lower urinary tract symptoms 06/18/2016   HLD (hyperlipidemia)    Mixed hyperlipidemia    CAD (coronary artery disease)    Coronary artery disease involving native coronary artery of native heart with angina pectoris with documented spasm (HCC)    NSTEMI (non-ST elevated myocardial infarction) ([place]) 03/19/2016   HTN (hypertension)    High blood cholesterol level    Acid reflux    Hyperlipidemia 12/27/2008   Essential hypertension 12/27/2008   Coronary atherosclerosis 12/27/2008   Aortic valve disorder 12/27/2008   Esophagitis, reflux 06/06/2006   Hypertensive heart disease 08/10/2003    Plan    We discussed balancing the risks of cholecystectomy to eliminate the threat of a gallstone pancreatitis versus his current lack of symptoms and his overall risk of anesthesia. I feel as well as he is doing with the progress he is made, I do not think an intervention is wise at this time, and would like to continue to see him make progress in elevating his new baseline.  His daughter and wife are present and agree we will follow-up with cardiology and abide by their recommendations.   We  will have them back in about 2 months or as needed to reevaluate how he is getting along.  We discussed the avoidance of fats in his diet.   Face-to-face time spent with the patient and  accompanying care providers(if present) was 15 minutes, with more than 50% of the time spent counseling, educating, and coordinating care of the patient.    These notes generated with voice recognition software. I apologize for typographical errors.  [name] M.D., FACS 10/13/2021, 9:14 AM

## 2021-10-16 ENCOUNTER — Encounter: Payer: Self-pay | Admitting: Medical

## 2021-10-16 ENCOUNTER — Other Ambulatory Visit: Payer: Self-pay

## 2021-10-16 ENCOUNTER — Ambulatory Visit: Payer: Medicare Other | Admitting: Medical

## 2021-10-16 VITALS — BP 120/70 | HR 68 | Ht 70.0 in | Wt 162.1 lb

## 2021-10-16 DIAGNOSIS — I2510 Atherosclerotic heart disease of native coronary artery without angina pectoris: Secondary | ICD-10-CM | POA: Diagnosis not present

## 2021-10-16 DIAGNOSIS — I10 Essential (primary) hypertension: Secondary | ICD-10-CM | POA: Diagnosis not present

## 2021-10-16 DIAGNOSIS — I5032 Chronic diastolic (congestive) heart failure: Secondary | ICD-10-CM

## 2021-10-16 DIAGNOSIS — E782 Mixed hyperlipidemia: Secondary | ICD-10-CM

## 2021-10-16 DIAGNOSIS — I359 Nonrheumatic aortic valve disorder, unspecified: Secondary | ICD-10-CM

## 2021-10-16 IMAGING — DX DG CHEST 1V PORT
1 series · 1 of 1 positions shown · non-contrast
Comparison: 02/01/2021

CLINICAL DATA: Hypoxia.  Gallstone pancreatitis.

EXAM:
PORTABLE CHEST 1 VIEW

[chest ap]
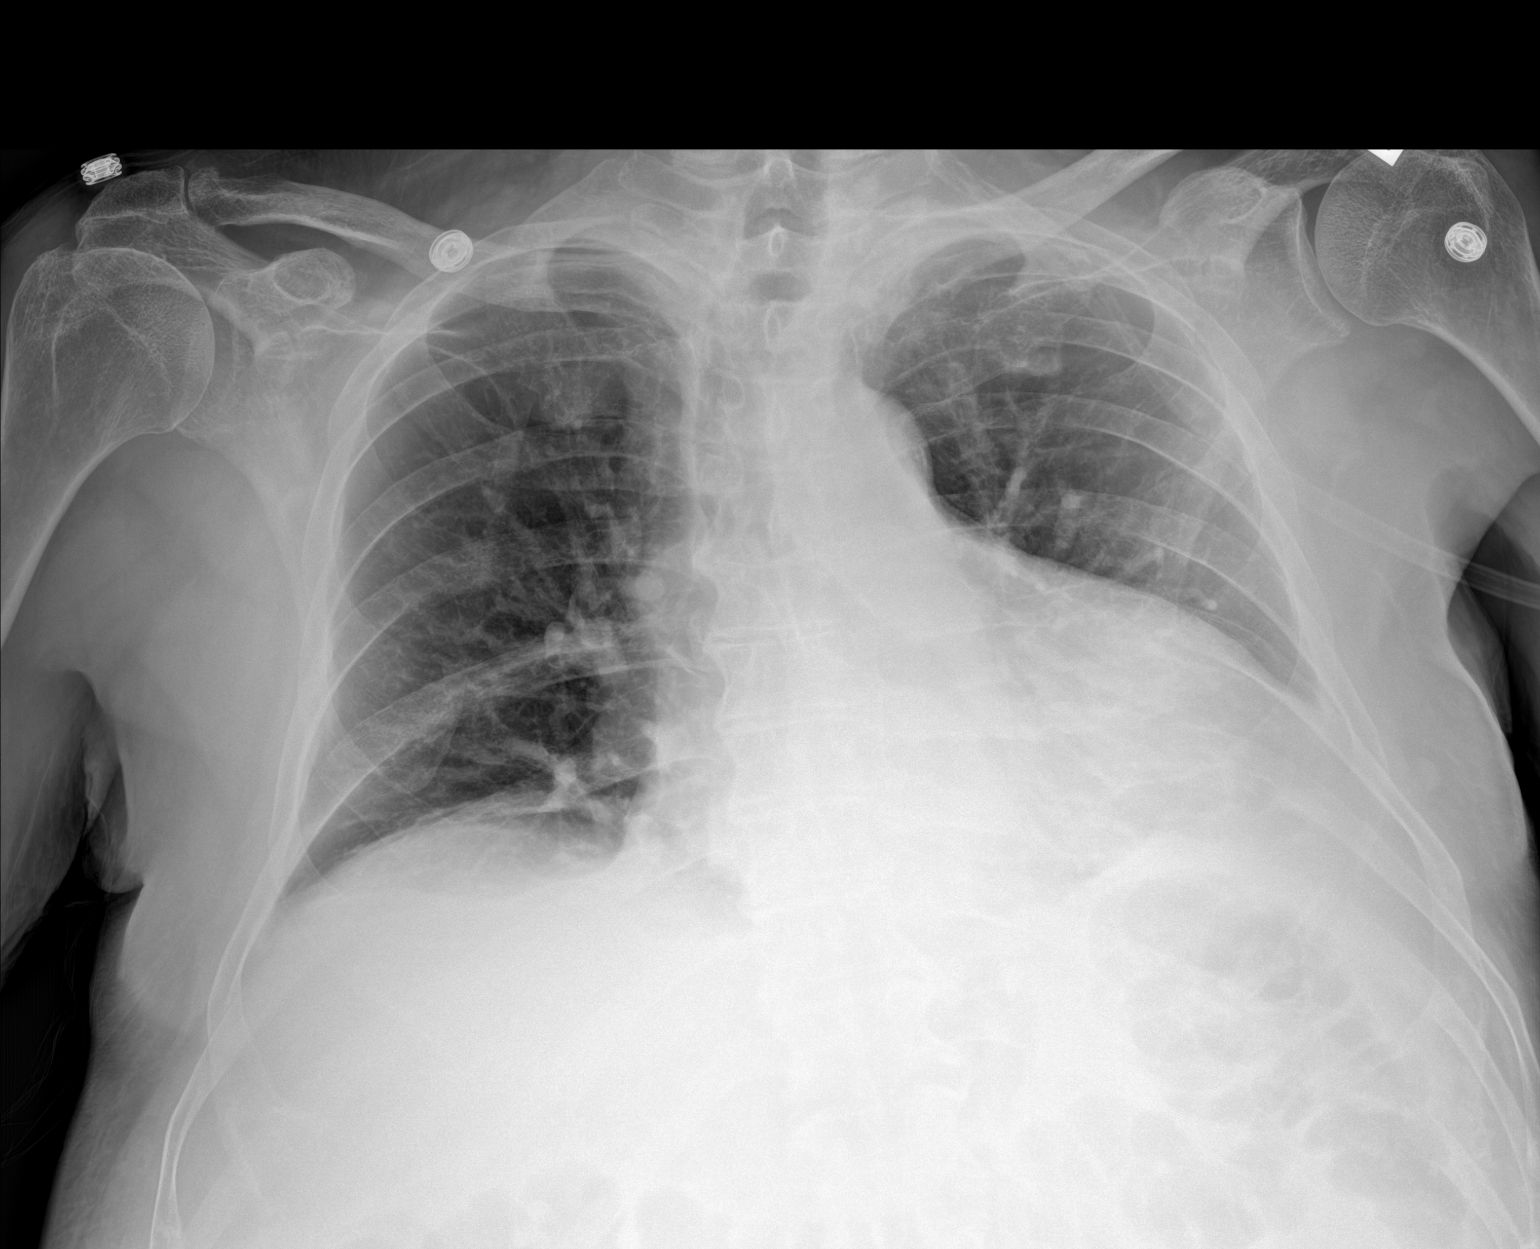

[1 of 1 positions shown; findings below may reference images not displayed]

FINDINGS: The heart is enlarged. There is streaky atelectasis at both lung
bases. No focal consolidations or pleural effusions. No pulmonary
edema.
IMPRESSION: Stable cardiomegaly.

## 2021-10-16 MED ORDER — FUROSEMIDE 20 MG PO TABS: 20.0000 mg | ORAL_TABLET | Freq: Every day | ORAL | 3 refills | Status: DC

## 2021-10-16 NOTE — Patient Instructions (Signed)
Medication Instructions:  ? ?Your physician recommends that you continue on your current medications as directed. Please refer to the Current Medication list given to you today.  ? ?*If you need a refill on your cardiac medications before your next appointment, please call your pharmacy* ? ? ?Lab Work: ?None ordered  ? ?If you have labs (blood work) drawn today and your tests are completely normal, you will receive your results only by: ?MyChart Message (if you have MyChart) OR ?A paper copy in the mail ?If you have any lab test that is abnormal or we need to change your treatment, we will call you to review the results. ? ? ?Testing/Procedures: ?None ordered ? ? ?Follow-Up: ?At [facility], you and your health needs are our priority.  As part of our continuing mission to provide you with exceptional heart care, we have created designated Provider Care Teams.  These Care Teams include your primary Cardiologist (physician) and Advanced Practice Providers (APPs -  Physician Assistants and Nurse Practitioners) who all work together to provide you with the care you need, when you need it. ? ?We recommend signing up for the patient portal called "MyChart".  Sign up information is provided on this After Visit Summary.  MyChart is used to connect with patients for Virtual Visits (Telemedicine).  Patients are able to view lab/test results, encounter notes, upcoming appointments, etc.  Non-urgent messages can be sent to your provider as well.   ?To learn more about what you can do with MyChart, go to [email or web address].   ? ?Your next appointment:   ?1 month(s) ? ?The format for your next appointment:   ?In Person ? ?Provider:   ?You may see [name], MD  or one of the following Advanced Practice Providers on your designated Care Team:   ?Murray Hodgkins, NP ?Christell Faith, PA-C ?Cadence Kathlen Mody, PA-C ? ? ?Other Instructions ?N/A ?

## 2021-10-16 NOTE — Progress Notes (Signed)
Cardiology Office Note:    Date:  10/18/2021   ID:  James Mora, DOB February 11, 1936, MRN [id number]  PCP:  [name], [name]  [facility] Cardiologist:  None  [facility] Electrophysiologist:  None   Referring MD: [name], [name]   Chief Complaint: 4 week follow-up  History of Present Illness:    James Mora is a 86 y.o. male with a hx of CAD, aortic insuffiencey, hypertensive heart disease, CKD stage, HLD who presents for 4 week follow-up.    Cardiac catheterization in 2000 showed severe distal LAD disease with left to left collaterals that was managed medically at that time.  He was hospitalized in August 2017 with non-ST elevation myocardial infarction. LHC showed mild proximal LAD disease with 30% stenosis and possible occlusion of a small PL 3.  Ejection fraction was normal.  Medical management was recommended.  Echocardiogram showed normal LV systolic function with stable mild to moderate aortic insufficiency.    He had significant leg edema that improved after stopping amlodipine.     He was hospitalized in July 2022 with acute pancreatitis with gallstones and enteritis.  The patient had peripancreatic edema resulting in significant abdominal pain.  He had an MRCP done which showed no evidence of stones but there was tapering of the common bile duct. The patient was transferred to [facility] where he stayed there for another week and then he was discharged to rehab.  The plan was to proceed with cholecystectomy but giving ongoing issues with abdominal pain, that has not happened yet.  Last seen 09/17/21 for pre-op cardiovascular evaluation for cholecystectomy. He was 8-10lbs up. Echo was ordered and lasix 20 mg daily.   Today, echo was reviewed in detail. He has been on lasix '20mg'$  daily. Says lower leg edema has resolved. Orthopnea has improved as well. No chest pain or significant shortness of breath. Minimal JVD noted on exam. They said the surgeon has put off cholecystectomy  until later, he has an appointment in May to further discuss.   Past Medical History:  Diagnosis Date   Acid reflux    Aortic insufficiency    a. 03/2016 Echo: EF 55-60%, no rwma, Gr1 DD, mild to mod AI, mild TR.   CAD (coronary artery disease)    a. 2000 Cath: Severe distal LAD disease with left to left collaterals;  b. 2008 MV: non-ischemic; c. 03/2016 Cath: LM nl, LAD 30p, D1/D2 mod, nl, LCX nl, OM1/2 nl w/ L-->L collats to apical LAD, RCA dominant, nl. EF 55-65%.   Hypertensive heart disease 2005   unspecified   Mixed hyperlipidemia    mixed    Past Surgical History:  Procedure Laterality Date   CARDIAC CATHETERIZATION  2000   left heart   CARDIAC CATHETERIZATION N/A 03/22/2016   Procedure: Left Heart Cath And Coronary Angiography;  Surgeon: Minna Merritts, MD;  Location: [place] [facility];  Service: Cardiovascular;  Laterality: N/A;   COLONOSCOPY  2008   Dr. Allen Norris    Current Medications: Current Meds  Medication Sig   acetaminophen (TYLENOL) 500 MG tablet Take 500 mg by mouth every 6 (six) hours as needed.   amLODipine (NORVASC) 2.5 MG tablet TAKE 1 TABLET(2.5 MG) BY MOUTH DAILY   aspirin 81 MG tablet Take 81 mg by mouth daily.   atorvastatin (LIPITOR) 20 MG tablet TAKE 1 TABLET(20 MG) BY MOUTH DAILY AT 6 PM   calcium carbonate (TUMS - DOSED IN MG ELEMENTAL CALCIUM) 500 MG chewable tablet  Chew 1 tablet by mouth 2 (two) times daily as needed for indigestion or heartburn.   carvedilol (COREG) 12.5 MG tablet TAKE 1 TABLET(12.5 MG) BY MOUTH TWICE DAILY   lipase/protease/amylase (CREON) 36000 UNITS CPEP capsule Take 1 capsule (36,000 Units total) by mouth 3 (three) times daily with meals.   nitroGLYCERIN (NITROSTAT) 0.4 MG SL tablet Place 0.4 mg under the tongue every 5 (five) minutes as needed.   omeprazole (PRILOSEC) 20 MG capsule TAKE 2 CAPSULES(40 MG) BY MOUTH DAILY   tamsulosin (FLOMAX) 0.4 MG CAPS capsule Take 1 capsule (0.4 mg total) by mouth daily.   Thiamine HCl (B-1)  100 MG TABS TAKE 1 TABLET BY MOUTH EVERY DAY   traZODone (DESYREL) 50 MG tablet TAKE 1/2 TO 1 TABLET(25 TO 50 MG) BY MOUTH AT BEDTIME AS NEEDED FOR SLEEP   [DISCONTINUED] furosemide (LASIX) 20 MG tablet Take 1 tablet (20 mg total) by mouth daily.     Allergies:   Imodium [loperamide] and Hydroxyzine hcl   Social History   Socioeconomic History   Marital status: Married    Spouse name: Not on file   Number of children: 1   Years of education: Not on file   Highest education level: Not on file  Occupational History   Not on file  Tobacco Use   Smoking status: Never   Smokeless tobacco: Never  Vaping Use   Vaping Use: Never used  Substance and Sexual Activity   Alcohol use: No   Drug use: No   Sexual activity: Not on file  Other Topics Concern   Not on file  Social History Narrative   Not on file   Social Determinants of Health   Financial Resource Strain: Low Risk    Difficulty of Paying Living Expenses: Not hard at all  Food Insecurity: No Food Insecurity   Worried About Charity fundraiser in the Last Year: Never true   [place] in the Last Year: Never true  Transportation Needs: No Transportation Needs   Lack of Transportation (Medical): No   Lack of Transportation (Non-Medical): No  Physical Activity: Insufficiently Active   Days of Exercise per Week: 1 day   Minutes of Exercise per Session: 10 min  Stress: No Stress Concern Present   Feeling of Stress : Only a little  Social Connections: Unknown   Frequency of Communication with Friends and Family: More than three times a week   Frequency of Social Gatherings with Friends and Family: Patient refused   Attends Religious Services: Not on Electrical engineer or Organizations: No   Attends Archivist Meetings: Never   Marital Status: Married     Family History: The patient's Family history is unknown by patient.  ROS:   Please see the history of present illness.     All other  systems reviewed and are negative.  EKGs/Labs/Other Studies Reviewed:    The following studies were reviewed today:  Echo 10/09/21  1. Left ventricular ejection fraction, by estimation, is 50 to 55%. The  left ventricle has low normal function. The left ventricle has no regional  wall motion abnormalities. The left ventricular internal cavity size was  mildly dilated. There is mild  left ventricular hypertrophy. Left ventricular diastolic parameters are  consistent with Grade I diastolic dysfunction (impaired relaxation).   2. Right ventricular systolic function is normal. The right ventricular  size is normal. There is normal pulmonary artery systolic pressure. The  estimated  right ventricular systolic pressure is 18.2 mmHg.   3. Left atrial size was mildly dilated.   4. The mitral valve is normal in structure. Mild mitral valve  regurgitation. No evidence of mitral stenosis.   5. The aortic valve is tricuspid. Aortic valve regurgitation is moderate.  Aortic valve sclerosis is present, with no evidence of aortic valve  stenosis.   6. The inferior vena cava is normal in size with greater than 50%  respiratory variability, suggesting right atrial pressure of 3 mmHg.   7. There is mild dilatation of the aortic root, measuring 41 mm. There is  mild dilatation of the ascending aorta, measuring 41 mm.   8. Frequent PVCs   Comparison(s): EF 70-75%; Aorta 4.1 cm.   Echo 02/03/21  1. Left ventricular ejection fraction, by estimation, is 70 to 75%. The  left ventricle has hyperdynamic function. The left ventricle has no  regional wall motion abnormalities. Left ventricular diastolic parameters  were normal.   2. Right ventricular systolic function is normal. The right ventricular  size is mildly enlarged. Tricuspid regurgitation signal is inadequate for  assessing PA pressure.   3. The mitral valve is grossly normal. No evidence of mitral valve  regurgitation. No evidence of mitral  stenosis.   4. The aortic valve is tricuspid. Aortic valve regurgitation is mild.  Mild aortic valve sclerosis is present, with no evidence of aortic valve  stenosis.   5. Aortic dilatation noted. There is mild dilatation of the aortic root,  measuring 41 mm.   Cardiac cath 2017 Left Heart Cath And Coronary Angiography   Conclusion  Prox LAD lesion, 30 %stenosed.    EKG:  EKG is not ordered today.   Recent Labs: 02/01/2021: Magnesium 1.7 02/27/2021: B Natriuretic Peptide 149.7 09/08/2021: ALT 8; Hemoglobin 10.0; Platelets 234; TSH 7.750 09/25/2021: BUN 20; Creatinine, Ser 1.22; Potassium 4.3; Sodium 144  Recent Lipid Panel    Component Value Date/Time   CHOL 125 02/01/2021 1849   CHOL 125 01/02/2021 0809   TRIG 68 02/01/2021 1849   HDL 49 02/01/2021 1849   HDL 45 01/02/2021 0809   CHOLHDL 2.6 02/01/2021 1849   VLDL 14 02/01/2021 1849   LDLCALC 62 02/01/2021 1849   LDLCALC 64 01/02/2021 0809   LDLCALC 58 06/20/2017 0959   Physical Exam:    VS:  BP 120/70 (BP Location: Left Arm, Patient Position: Sitting, Cuff Size: Normal)    Pulse 68    Ht '5\' 10"'$  (1.778 m)    Wt 162 lb 2 oz (73.5 kg)    SpO2 98%    BMI 23.26 kg/m     Wt Readings from Last 3 Encounters:  10/16/21 162 lb 2 oz (73.5 kg)  10/13/21 164 lb 9.6 oz (74.7 kg)  09/17/21 176 lb (79.8 kg)     GEN:  Well nourished, well developed in no acute distress HEENT: Normal NECK: + minimal JVD; No carotid bruits LYMPHATICS: No lymphadenopathy CARDIAC: RRR, no murmurs, rubs, gallops RESPIRATORY:  Clear to auscultation without rales, wheezing or rhonchi  ABDOMEN: Soft, non-tender, non-distended MUSCULOSKELETAL:  No edema; No deformity  SKIN: Warm and dry NEUROLOGIC:  Alert and oriented x 3 PSYCHIATRIC:  Normal affect   ASSESSMENT:    1. Chronic diastolic heart failure ([place])   2. Coronary artery disease involving native coronary artery of native heart without angina pectoris   3. Essential hypertension   4.  Hyperlipidemia, mixed   5. Aortic valve disorder    PLAN:  In order of problems listed above:  Chronic diastolic heart failure Patient overall is doing much better. Weight is down about 10 lbs. LLE and orthopnea essentially resolved. Very minimal JVD on exam. Follow-up labs showed a stable kidney function. Continue lasix '20mg'$  daily, we will refill this. CHF education discussed in detail. Continue BB.   CAD Patient denies anginal symptoms. No further work-up indicated. Continue Aspirin, statin and BB.   HLD LDL 62 01/2021. Continue statin.  HTN BP good today, continue current medications.   Aortic insufficieny Moderate by recent echo. Can follow with annual echocardiograms.   Pre-op for cholecystectomy Echo showed normal LVEF with G1DD and some progression of valvular disease. No anginal symptoms reported. He is essentially back to euvolemia. No further cardiac work-up prior to possible surgery. METS>4. RCRI class 3 risk with `10.1% 30 day risk of death, MI or cardiac arrest.  Patient reported he has an appointment in May to discuss surgery. OK from a cardiac perspective to pursue with surgery, would be cautios with IVF.   Disposition: Follow up in 1 month(s) with MD/APP    Signed, Haylen Bellotti Ninfa Meeker, PA-C  10/18/2021 9:51 PM    [facility]

## 2021-10-20 ENCOUNTER — Telehealth: Payer: Self-pay

## 2021-10-20 NOTE — Telephone Encounter (Signed)
-----   Message from Wellington Hampshire, MD sent at 10/15/2021 11:08 AM EST ----- ?Inform patient that echo was fine.  Normal ejection fraction and normal pulmonary pressures suggesting resolution of volume overload.  There was mild mitral regurgitation and moderate aortic regurgitation which seem to be stable continue same medications and follow-up as planned. ? ?

## 2021-10-20 NOTE — Telephone Encounter (Signed)
Patient made aware of echo results with verbalized understanding. 

## 2021-10-20 NOTE — Telephone Encounter (Signed)
Called to give the patient echo results. Lmtcb. 

## 2021-10-20 NOTE — Telephone Encounter (Signed)
Patient is returning the call  

## 2021-10-29 ENCOUNTER — Telehealth: Payer: Self-pay

## 2021-10-29 NOTE — Progress Notes (Signed)
? ? ?Chronic Care Management ?Pharmacy Assistant  ? ?Name: James Mora  MRN: [id number] DOB: 1936/06/19 ? ?Reason for Encounter:Diabetes Disease State Call. ?  ?Recent office visits:  ?No recent office visit ? ?Recent consult visits:  ?10/16/2021 Cadence Kathlen Mody PA-C (Cardiology) No Medication Changes noted,Follow up 1 month ?10/13/2021 Dr. Christian Mate MD (General Surgery) No Medication Changes noted, follow up in 2 months ?09/29/2021 Daylene Katayama DPM (Podiatry) No Medications changes noted, follow up 3 months ? ?Hospital visits:  ?None in previous 6 months ? ?Medications: ?Outpatient Encounter Medications as of 10/29/2021  ?Medication Sig  ? acetaminophen (TYLENOL) 500 MG tablet Take 500 mg by mouth every 6 (six) hours as needed.  ? amLODipine (NORVASC) 2.5 MG tablet TAKE 1 TABLET(2.5 MG) BY MOUTH DAILY  ? aspirin 81 MG tablet Take 81 mg by mouth daily.  ? atorvastatin (LIPITOR) 20 MG tablet TAKE 1 TABLET(20 MG) BY MOUTH DAILY AT 6 PM  ? calcium carbonate (TUMS - DOSED IN MG ELEMENTAL CALCIUM) 500 MG chewable tablet Chew 1 tablet by mouth 2 (two) times daily as needed for indigestion or heartburn.  ? carvedilol (COREG) 12.5 MG tablet TAKE 1 TABLET(12.5 MG) BY MOUTH TWICE DAILY  ? furosemide (LASIX) 20 MG tablet Take 1 tablet (20 mg total) by mouth daily.  ? lipase/protease/amylase (CREON) 36000 UNITS CPEP capsule Take 1 capsule (36,000 Units total) by mouth 3 (three) times daily with meals.  ? nitroGLYCERIN (NITROSTAT) 0.4 MG SL tablet Place 0.4 mg under the tongue every 5 (five) minutes as needed.  ? omeprazole (PRILOSEC) 20 MG capsule TAKE 2 CAPSULES(40 MG) BY MOUTH DAILY  ? tamsulosin (FLOMAX) 0.4 MG CAPS capsule Take 1 capsule (0.4 mg total) by mouth daily.  ? Thiamine HCl (B-1) 100 MG TABS TAKE 1 TABLET BY MOUTH EVERY DAY  ? traZODone (DESYREL) 50 MG tablet TAKE 1/2 TO 1 TABLET(25 TO 50 MG) BY MOUTH AT BEDTIME AS NEEDED FOR SLEEP  ? [DISCONTINUED] lovastatin (ALTOPREV) 40 MG 24 hr tablet Take 80 mg by mouth at  bedtime.    ? [DISCONTINUED] simvastatin (ZOCOR) 40 MG tablet Take 40 mg by mouth at bedtime.    ? ?No facility-administered encounter medications on file as of 10/29/2021.  ? ? ?Care Gaps: ?Tetanus Vaccine ?Shingrix Vaccine ?Pneumonia Vaccine (Last Completed 02/05/2020) ? ?Star Rating Drugs: ?Atorvastatin 20 mg  last filled 10/15/2021 90 day supply at [facility]. ? ?Medication Fill Gaps: ?None ? ?Reviewed chart prior to disease state call. Spoke with patient regarding BP ? ?Recent Office Vitals: ?BP Readings from Last 3 Encounters:  ?10/16/21 120/70  ?10/13/21 132/74  ?09/17/21 108/60  ? ?Pulse Readings from Last 3 Encounters:  ?10/16/21 68  ?10/13/21 73  ?09/17/21 77  ?  ?Wt Readings from Last 3 Encounters:  ?10/16/21 162 lb 2 oz (73.5 kg)  ?10/13/21 164 lb 9.6 oz (74.7 kg)  ?09/17/21 176 lb (79.8 kg)  ?  ? ?Kidney Function ?Lab Results  ?Component Value Date/Time  ? CREATININE 1.22 09/25/2021 02:14 PM  ? CREATININE 1.18 09/08/2021 10:33 AM  ? CREATININE 1.43 (H) 06/20/2017 09:59 AM  ? GFRNONAA >60 02/27/2021 02:16 AM  ? GFRNONAA 46 (L) 06/20/2017 09:59 AM  ? GFRAA 46 (L) 02/05/2020 02:47 PM  ? GFRAA 54 (L) 06/20/2017 09:59 AM  ? ? ? ?  Latest Ref Rng & Units 09/25/2021  ?  2:14 PM 09/08/2021  ? 10:33 AM 03/24/2021  ?  9:02 AM  ?BMP  ?Glucose 70 - 99 mg/dL 105  105   122    ?BUN 8 - 27 mg/dL '20   20   23    '$ ?Creatinine 0.76 - 1.27 mg/dL 1.22   1.18   1.32    ?BUN/Creat Ratio 10 - '24 16   17   17    '$ ?Sodium 134 - 144 mmol/L 144   143   142    ?Potassium 3.5 - 5.2 mmol/L 4.3   4.3   3.9    ?Chloride 96 - 106 mmol/L 108   106   105    ?CO2 20 - 29 mmol/L '25   23   24    '$ ?Calcium 8.6 - 10.2 mg/dL 8.9   8.8   8.7    ? ? ?Current antihypertensive regimen:  ?Amlodipine 2.5 mg daily ?Carvedilol 12.5 mg twice daily ?Furosemide 20 mg daily ? ? ?What recent interventions/DTPs have been made by any provider to improve Blood Pressure control since last CPP Visit: None ID ? ?Any recent hospitalizations or ED visits since  last visit with CPP? No ? ?I have attempted without success to contact this patient by phone three times to do his hypertension Disease State call. I left a Voice message for patient to return my call. ? ? ?Adherence Review: ?Is the patient currently on ACE/ARB medication? No ?Does the patient have >5 day gap between last estimated fill dates? No ? ?Telephone follow up appointment with care management team member scheduled for:  03/29/2022 at 3:45 PM ? ?Bessie Kellihan,CPA ?Clinical Pharmacist Assistant ?[phone]  ? ? ?

## 2021-10-30 ENCOUNTER — Other Ambulatory Visit: Payer: Self-pay | Admitting: Family Medicine

## 2021-10-30 DIAGNOSIS — K851 Biliary acute pancreatitis without necrosis or infection: Secondary | ICD-10-CM

## 2021-11-05 IMAGING — CT CT ANGIO CHEST
2 of 6 series · 17 of 46 positions shown · IV contrast (APPLIED)
Comparison: None.

CLINICAL DATA: Labored breathing.  Elevated D-dimer.  PE suspected.

EXAM:
CT ANGIOGRAPHY CHEST WITH CONTRAST
TECHNIQUE: Multidetector CT imaging of the chest was performed using the
standard protocol during bolus administration of intravenous
contrast. Multiplanar CT image reconstructions and MIPs were
obtained to evaluate the vascular anatomy.
CONTRAST:  75mL OMNIPAQUE IOHEXOL 350 MG/ML SOLN

[Series 5: thins · axial · 0.66mm/px · z∈[-684,-439]mm · 14 of 334 slices shown]
[im 14/334  lung]
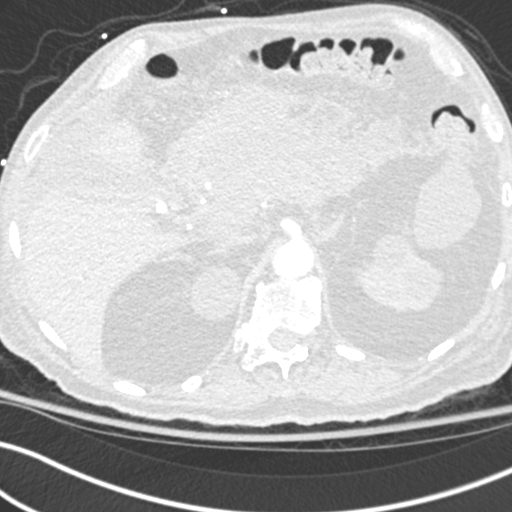
[im 42/334  soft-tissue]
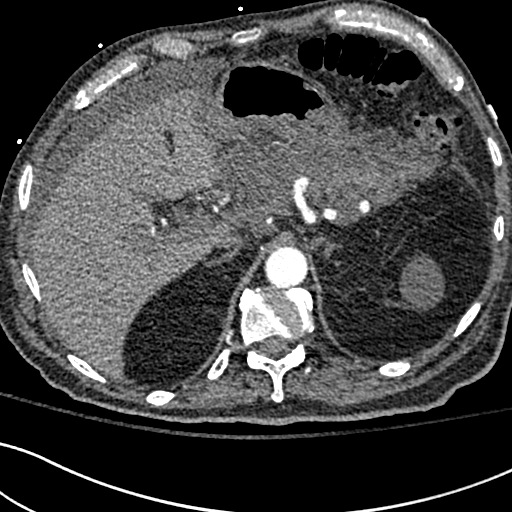
[im 70/334  lung]
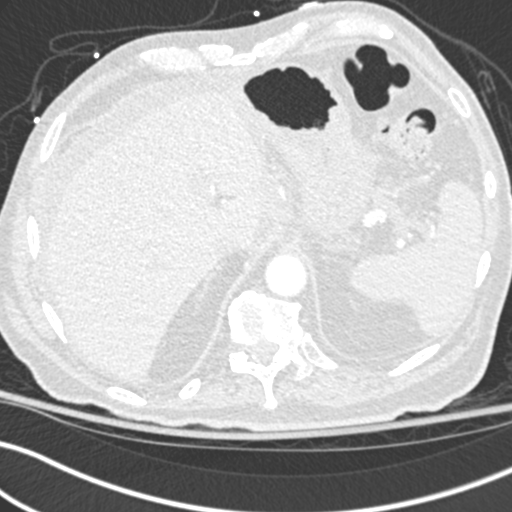
[im 84/334  soft-tissue]
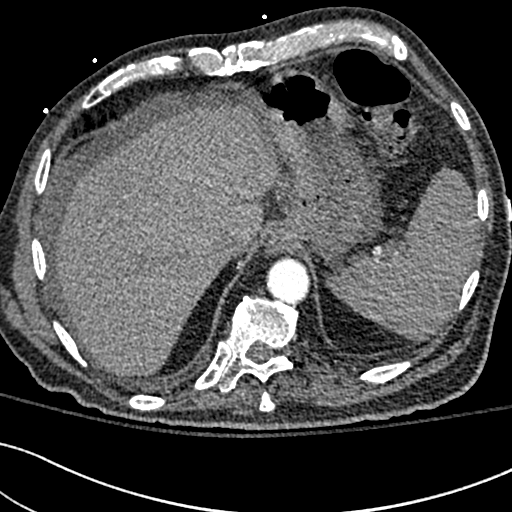
[im 112/334  lung]
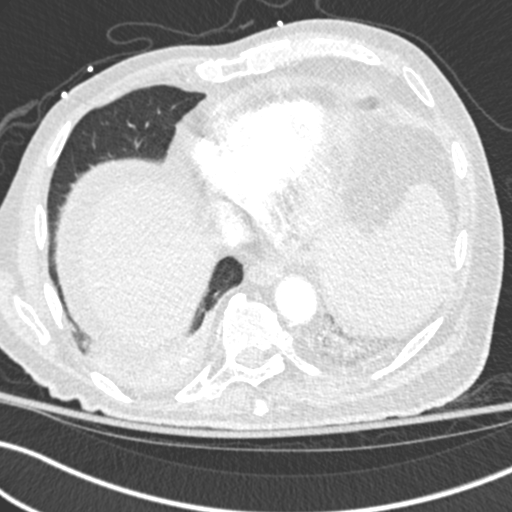
[im 139/334  soft-tissue]
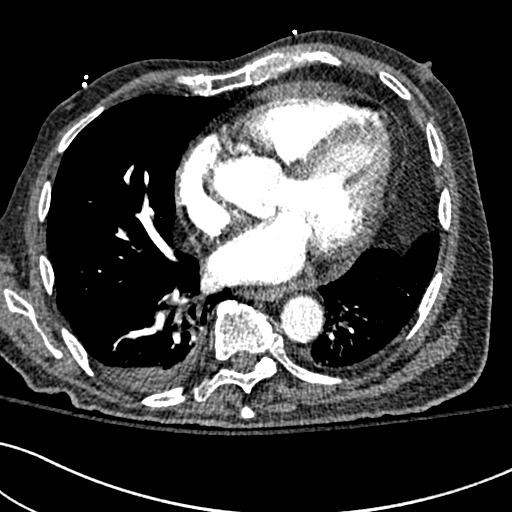
[im 153/334  lung]
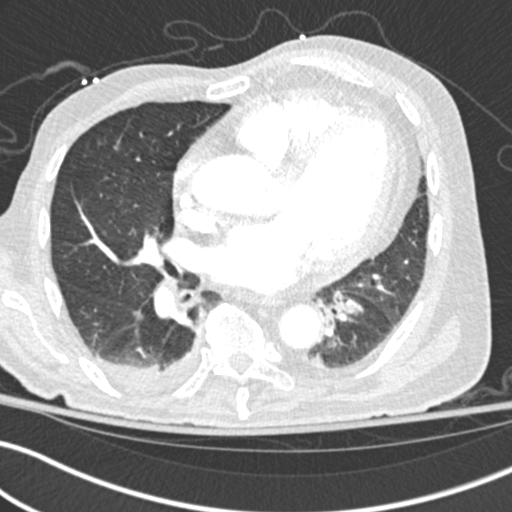
[im 181/334  soft-tissue]
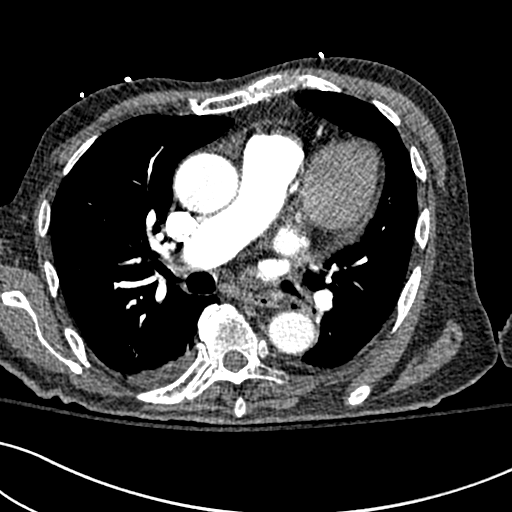
[im 195/334  lung]
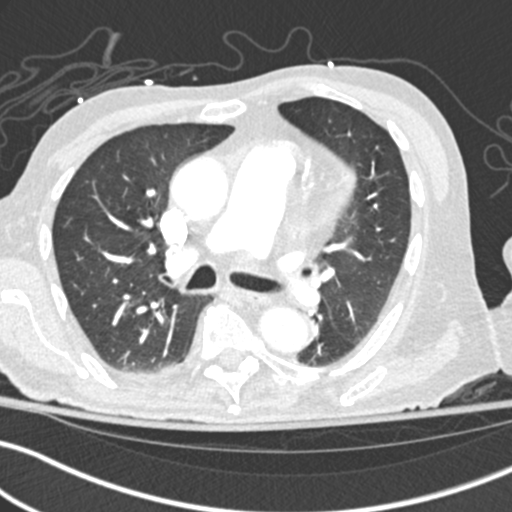
[im 223/334  soft-tissue]
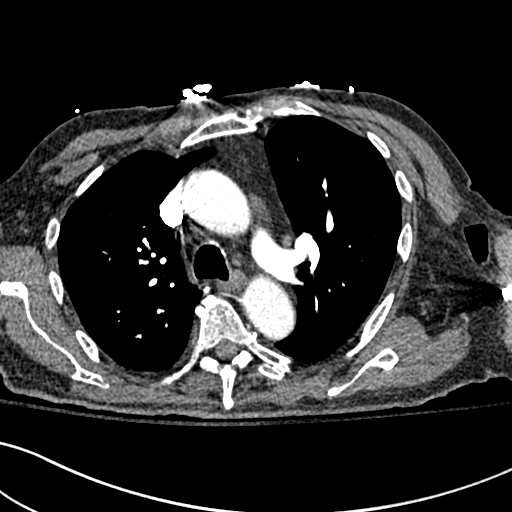
[im 250/334  lung]
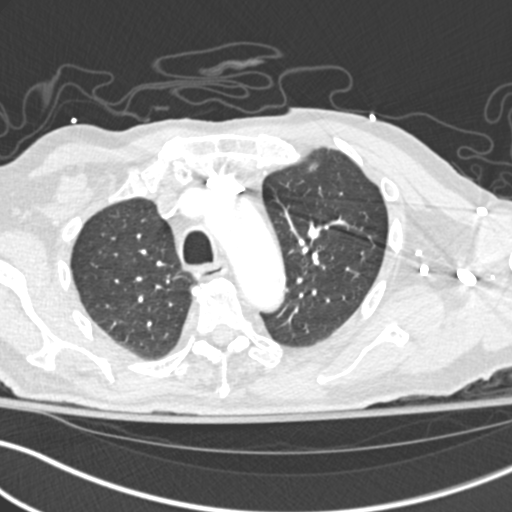
[im 264/334  soft-tissue]
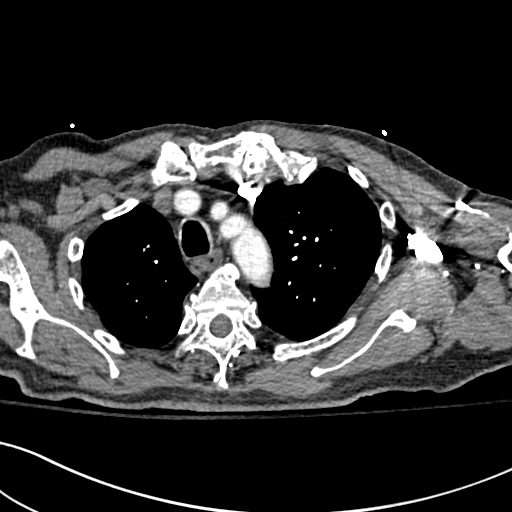
[im 292/334  lung]
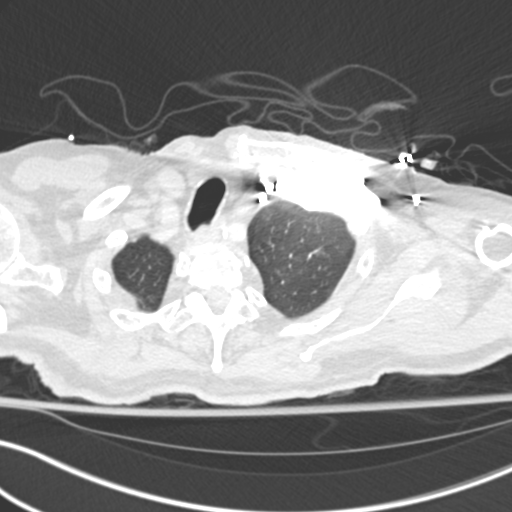
[im 320/334  soft-tissue]
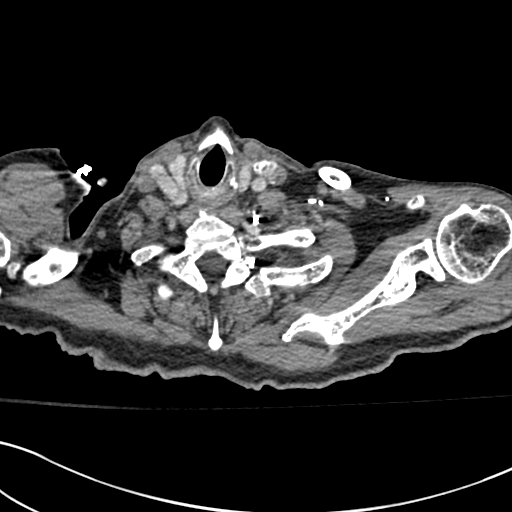

[Series 7: coronal mpr · coronal · 0.53mm/px · 3 of 92 slices shown]
[im 23/92  soft-tissue]
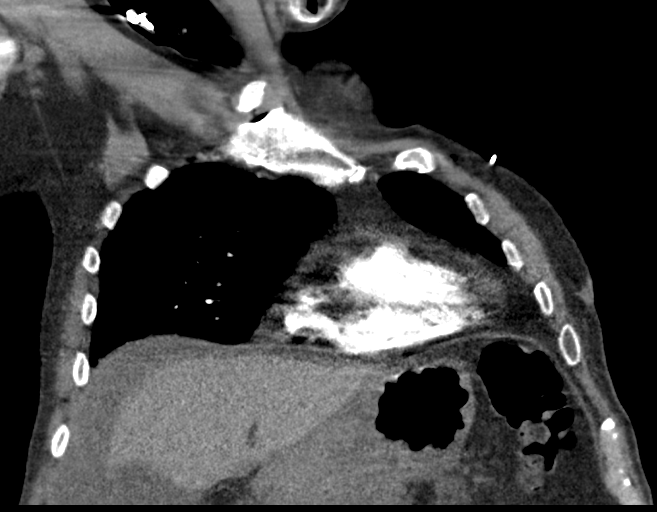
[im 46/92  soft-tissue]
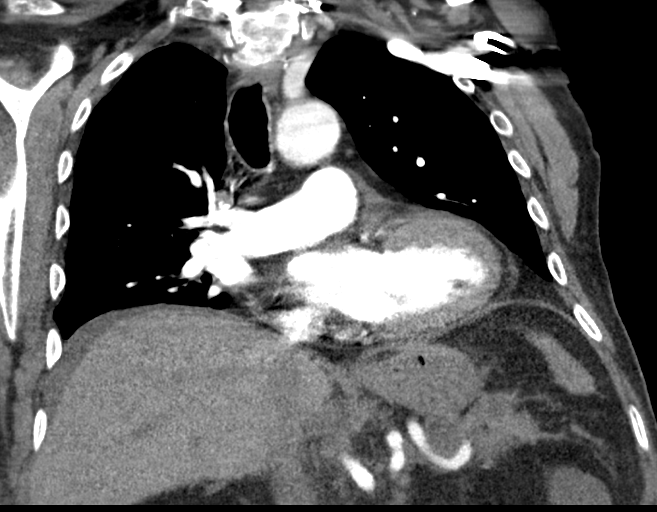
[im 69/92  soft-tissue]
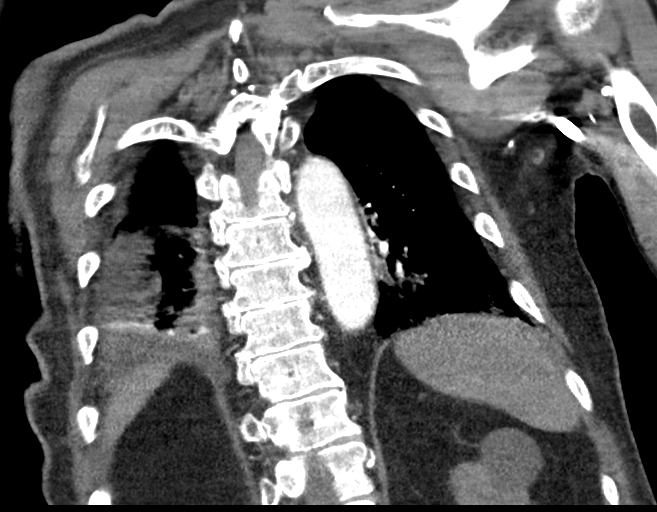

[17 of 46 positions shown; findings below may reference images not displayed]

FINDINGS: Cardiovascular: The heart is enlarged. Tiny Pericardial effusion.
Ascending thoracic aorta measures 4.0 cm diameter. Enlargement of
the pulmonary outflow tract and main pulmonary arteries suggests
pulmonary arterial hypertension. There is no filling defect within
the opacified pulmonary arteries to suggest the presence of an acute
pulmonary embolus.

Mediastinum/Nodes: No mediastinal lymphadenopathy. There is no hilar
lymphadenopathy. The esophagus has normal imaging features. There is
no axillary lymphadenopathy.

Lungs/Pleura: Mild collapse/consolidation noted both posterior lung
bases, right greater than left, likely atelectasis although
superimposed infection is not excluded. There is a tiny right
pleural effusion.

Upper Abdomen: Perihepatic ascites noted. Layering sludge and 8 mm
calcified stone noted in the partially visualized gallbladder.
Extensive edema/inflammation noted around the visualized portion of
the pancreas. Multiple cysts noted in the visualized upper kidneys,
better characterized on previous MRI.

Musculoskeletal: No worrisome lytic or sclerotic osseous
abnormality.

Review of the MIP images confirms the above findings.
IMPRESSION: 1. No CT evidence for acute pulmonary embolus.
2. Enlargement of the pulmonary outflow tract and main pulmonary
arteries suggests pulmonary arterial hypertension.
3. Mild collapse/consolidation both posterior lung bases, right
greater than left, likely atelectasis although superimposed
infection is not excluded. Tiny right pleural effusion.
4. Perihepatic ascites.
5. Cholelithiasis with layering sludge and calcified stones in the
partially visualized gallbladder.
6. Extensive edema/inflammation around the visualized portion of the
pancreas. Imaging features compatible with known pancreatitis.
7. Aortic Atherosclerosis ([id number]-1F3.3).

## 2021-11-20 ENCOUNTER — Encounter: Payer: Self-pay | Admitting: Medical

## 2021-11-20 ENCOUNTER — Ambulatory Visit: Payer: Medicare Other | Admitting: Medical

## 2021-11-20 VITALS — BP 100/60 | HR 97 | Ht 70.0 in | Wt 159.5 lb

## 2021-11-20 DIAGNOSIS — I351 Nonrheumatic aortic (valve) insufficiency: Secondary | ICD-10-CM

## 2021-11-20 DIAGNOSIS — I2510 Atherosclerotic heart disease of native coronary artery without angina pectoris: Secondary | ICD-10-CM

## 2021-11-20 DIAGNOSIS — I10 Essential (primary) hypertension: Secondary | ICD-10-CM | POA: Diagnosis not present

## 2021-11-20 DIAGNOSIS — E782 Mixed hyperlipidemia: Secondary | ICD-10-CM

## 2021-11-20 DIAGNOSIS — I5032 Chronic diastolic (congestive) heart failure: Secondary | ICD-10-CM

## 2021-11-20 NOTE — Patient Instructions (Signed)
Medication Instructions:  ?Your physician has recommended you make the following change in your medication:  ? ?INCREASE Lasix to 40 mg daily for 3 days. Then go back down to 20 mg daily. ? ?*If you need a refill on your cardiac medications before your next appointment, please call your pharmacy* ? ? ?Lab Work: ?None ordered ?If you have labs (blood work) drawn today and your tests are completely normal, you will receive your results only by: ?MyChart Message (if you have MyChart) OR ?A paper copy in the mail ?If you have any lab test that is abnormal or we need to change your treatment, we will call you to review the results. ? ? ?Testing/Procedures: ?None ordered ? ? ?Follow-Up: ?At [facility], you and your health needs are our priority.  As part of our continuing mission to provide you with exceptional heart care, we have created designated Provider Care Teams.  These Care Teams include your primary Cardiologist (physician) and Advanced Practice Providers (APPs -  Physician Assistants and Nurse Practitioners) who all work together to provide you with the care you need, when you need it. ? ?We recommend signing up for the patient portal called "MyChart".  Sign up information is provided on this After Visit Summary.  MyChart is used to connect with patients for Virtual Visits (Telemedicine).  Patients are able to view lab/test results, encounter notes, upcoming appointments, etc.  Non-urgent messages can be sent to your provider as well.   ?To learn more about what you can do with MyChart, go to [email or web address].   ? ?Your next appointment:   ?3 month(s) ? ?The format for your next appointment:   ?In Person ? ?Provider:   ?You may see [name], MD or one of the following Advanced Practice Providers on your designated Care Team:   ?Murray Hodgkins, NP ?Christell Faith, PA-C ?Cadence Kathlen Mody, PA-C ? ? ?Other Instructions ?N/A ? ?Important Information About Sugar ? ? ? ? ? ? ?

## 2021-11-20 NOTE — Progress Notes (Signed)
?Cardiology Office Note:   ? ?Date:  11/20/2021  ? ?ID:  James Mora, DOB [birth year], MRN [id number] ? ?PCP:  [name], [name]  ?[facility] Cardiologist:  Fletcher Anon ?[place] Electrophysiologist:  None  ? ?Referring MD: [name], [name]  ? ?Chief Complaint: 1 month follow-up ? ?History of Present Illness:   ? ?James Mora is a 86 y.o. male with a hx of  with a hx of CAD, aortic insuffiencey, hypertensive heart disease, CKD stage, HLD who presents for 4 week follow-up.   ?  ?Cardiac catheterization in 2000 showed severe distal LAD disease with left to left collaterals that was managed medically at that time.  He was hospitalized in August 2017 with non-ST elevation myocardial infarction. LHC showed mild proximal LAD disease with 30% stenosis and possible occlusion of a small PL 3.  Ejection fraction was normal.  Medical management was recommended.  Echocardiogram showed normal LV systolic function with stable mild to moderate aortic insufficiency.  ?  ?He had significant leg edema that improved after stopping amlodipine.   ?  ?He was hospitalized in July 2022 with acute pancreatitis with gallstones and enteritis.  The patient had peripancreatic edema resulting in significant abdominal pain.  He had an MRCP done which showed no evidence of stones but there was tapering of the common bile duct. The patient was transferred to [facility] where he stayed there for another week and then he was discharged to rehab.  The plan was to proceed with cholecystectomy but giving ongoing issues with abdominal pain, that has not happened yet. ? ?seen 09/17/21 for pre-op cardiovascular evaluation for cholecystectomy. He was 8-10lbs up. Echo was ordered and lasix 20 mg daily. Echo showed LVEF 50-55%, no WMA, G1DD, mild MR, mild artic root dilation measuring 88m, frequent PCs.  ? ?Last seen 10/16/21 and was doing well on lasix, no LLE.  ? ?Today, the patient reports breathing and swelling is much better. No chest pain. BP is soft  100/68. BP is higher at home. Weight is down to 159lbs. He walks daily. Overall doing better.  ? ? ?Past Medical History:  ?Diagnosis Date  ? Acid reflux   ? Aortic insufficiency   ? a. 03/2016 Echo: EF 55-60%, no rwma, Gr1 DD, mild to mod AI, mild TR.  ? CAD (coronary artery disease)   ? a. 2000 Cath: Severe distal LAD disease with left to left collaterals;  b. 2008 MV: non-ischemic; c. 03/2016 Cath: LM nl, LAD 30p, D1/D2 mod, nl, LCX nl, OM1/2 nl w/ L-->L collats to apical LAD, RCA dominant, nl. EF 55-65%.  ? Hypertensive heart disease 2005  ? unspecified  ? Mixed hyperlipidemia   ? mixed  ? ? ?Past Surgical History:  ?Procedure Laterality Date  ? CARDIAC CATHETERIZATION  2000  ? left heart  ? CARDIAC CATHETERIZATION N/A 03/22/2016  ? Procedure: Left Heart Cath And Coronary Angiography;  Surgeon: TMinna Merritts MD;  Location[facility];  Service: Cardiovascular;  Laterality: N/A;  ? COLONOSCOPY  2008  ? Dr. WAllen Norris ? ? ?Current Medications: ?Current Meds  ?Medication Sig  ? acetaminophen (TYLENOL) 500 MG tablet Take 500 mg by mouth every 6 (six) hours as needed.  ? amLODipine (NORVASC) 2.5 MG tablet TAKE 1 TABLET(2.5 MG) BY MOUTH DAILY  ? aspirin 81 MG tablet Take 81 mg by mouth daily.  ? atorvastatin (LIPITOR) 20 MG tablet TAKE 1 TABLET(20 MG) BY MOUTH DAILY AT 6 PM  ? calcium  carbonate (TUMS - DOSED IN MG ELEMENTAL CALCIUM) 500 MG chewable tablet Chew 1 tablet by mouth 2 (two) times daily as needed for indigestion or heartburn.  ? carvedilol (COREG) 12.5 MG tablet TAKE 1 TABLET(12.5 MG) BY MOUTH TWICE DAILY  ? CREON 36000-114000 units CPEP capsule TAKE 1 CAPSULE BY MOUTH THREE TIMES DAILY WITH MEALS  ? furosemide (LASIX) 20 MG tablet Take 1 tablet (20 mg total) by mouth daily.  ? nitroGLYCERIN (NITROSTAT) 0.4 MG SL tablet Place 0.4 mg under the tongue every 5 (five) minutes as needed.  ? omeprazole (PRILOSEC) 20 MG capsule TAKE 2 CAPSULES(40 MG) BY MOUTH DAILY  ? tamsulosin (FLOMAX) 0.4 MG CAPS capsule  Take 1 capsule (0.4 mg total) by mouth daily.  ? Thiamine HCl (B-1) 100 MG TABS TAKE 1 TABLET BY MOUTH EVERY DAY  ? traZODone (DESYREL) 50 MG tablet TAKE 1/2 TO 1 TABLET(25 TO 50 MG) BY MOUTH AT BEDTIME AS NEEDED FOR SLEEP  ?  ? ?Allergies:   Imodium [loperamide] and Hydroxyzine hcl  ? ?Social History  ? ?Socioeconomic History  ? Marital status: Married  ?  Spouse name: Not on file  ? Number of children: 1  ? Years of education: Not on file  ? Highest education level: Not on file  ?Occupational History  ? Not on file  ?Tobacco Use  ? Smoking status: Never  ? Smokeless tobacco: Never  ?Vaping Use  ? Vaping Use: Never used  ?Substance and Sexual Activity  ? Alcohol use: No  ? Drug use: No  ? Sexual activity: Not on file  ?Other Topics Concern  ? Not on file  ?Social History Narrative  ? Not on file  ? ?Social Determinants of Health  ? ?Financial Resource Strain: Low Risk   ? Difficulty of Paying Living Expenses: Not hard at all  ?Food Insecurity: No Food Insecurity  ? Worried About Charity fundraiser in the Last Year: Never true  ? Ran Out of Food in the Last Year: Never true  ?Transportation Needs: No Transportation Needs  ? Lack of Transportation (Medical): No  ? Lack of Transportation (Non-Medical): No  ?Physical Activity: Insufficiently Active  ? Days of Exercise per Week: 1 day  ? Minutes of Exercise per Session: 10 min  ?Stress: No Stress Concern Present  ? Feeling of Stress : Only a little  ?Social Connections: Unknown  ? Frequency of Communication with Friends and Family: More than three times a week  ? Frequency of Social Gatherings with Friends and Family: Patient refused  ? Attends Religious Services: Not on file  ? Active Member of Clubs or Organizations: No  ? Attends Archivist Meetings: Never  ? Marital Status: Married  ?  ? ?Family History: ?The patient's Family history is unknown by patient. ? ?ROS:   ?Please see the history of present illness.    ? All other systems reviewed and are  negative. ? ?EKGs/Labs/Other Studies Reviewed:   ? ?The following studies were reviewed today: ? ?Echo 10/09/21 ? 1. Left ventricular ejection fraction, by estimation, is 50 to 55%. The  ?left ventricle has low normal function. The left ventricle has no regional  ?wall motion abnormalities. The left ventricular internal cavity size was  ?mildly dilated. There is mild  ?left ventricular hypertrophy. Left ventricular diastolic parameters are  ?consistent with Grade I diastolic dysfunction (impaired relaxation).  ? 2. Right ventricular systolic function is normal. The right ventricular  ?size is normal. There is normal pulmonary  artery systolic pressure. The  ?estimated right ventricular systolic pressure is 82.5 mmHg.  ? 3. Left atrial size was mildly dilated.  ? 4. The mitral valve is normal in structure. Mild mitral valve  ?regurgitation. No evidence of mitral stenosis.  ? 5. The aortic valve is tricuspid. Aortic valve regurgitation is moderate.  ?Aortic valve sclerosis is present, with no evidence of aortic valve  ?stenosis.  ? 6. The inferior vena cava is normal in size with greater than 50%  ?respiratory variability, suggesting right atrial pressure of 3 mmHg.  ? 7. There is mild dilatation of the aortic root, measuring 41 mm. There is  ?mild dilatation of the ascending aorta, measuring 41 mm.  ? 8. Frequent PVCs  ? ?Comparison(s): EF 70-75%; Aorta 4.1 cm.  ?  ?Echo 02/03/21 ? 1. Left ventricular ejection fraction, by estimation, is 70 to 75%. The  ?left ventricle has hyperdynamic function. The left ventricle has no  ?regional wall motion abnormalities. Left ventricular diastolic parameters  ?were normal.  ? 2. Right ventricular systolic function is normal. The right ventricular  ?size is mildly enlarged. Tricuspid regurgitation signal is inadequate for  ?assessing PA pressure.  ? 3. The mitral valve is grossly normal. No evidence of mitral valve  ?regurgitation. No evidence of mitral stenosis.  ? 4. The aortic  valve is tricuspid. Aortic valve regurgitation is mild.  ?Mild aortic valve sclerosis is present, with no evidence of aortic valve  ?stenosis.  ? 5. Aortic dilatation noted. There is mild dilatation of the aort

## 2021-11-27 ENCOUNTER — Other Ambulatory Visit: Payer: Self-pay | Admitting: Family Medicine

## 2021-11-27 DIAGNOSIS — I10 Essential (primary) hypertension: Secondary | ICD-10-CM

## 2021-12-07 ENCOUNTER — Encounter: Payer: Self-pay | Admitting: Family Medicine

## 2021-12-07 ENCOUNTER — Ambulatory Visit (INDEPENDENT_AMBULATORY_CARE_PROVIDER_SITE_OTHER): Payer: Medicare Other | Admitting: Family Medicine

## 2021-12-07 VITALS — BP 119/67 | HR 67 | Temp 97.7°F | Resp 16 | Wt 157.3 lb

## 2021-12-07 DIAGNOSIS — I351 Nonrheumatic aortic (valve) insufficiency: Secondary | ICD-10-CM | POA: Insufficient documentation

## 2021-12-07 DIAGNOSIS — I10 Essential (primary) hypertension: Secondary | ICD-10-CM | POA: Diagnosis not present

## 2021-12-07 DIAGNOSIS — K851 Biliary acute pancreatitis without necrosis or infection: Secondary | ICD-10-CM | POA: Diagnosis not present

## 2021-12-07 NOTE — Assessment & Plan Note (Signed)
Chronic, stable ?Was seen by Cards last month ?ECHO repeated ?Patient continues to improve every day ?Able to ambulate 6x- 5-10 minutes at a time ?Has resumed driving ? ?

## 2021-12-07 NOTE — Assessment & Plan Note (Signed)
Chronic, stable ?Patient without pain and has continued to improve his diet ?Strength and ADLs are improving ?Currently on Creon with meals and has PAP ?Has followed up with surgery for discussion regarding gallbladder removal; currently on hold for cardiology follow up ?

## 2021-12-07 NOTE — Assessment & Plan Note (Signed)
Chronic, stable, soft at times ?Denies CP ?Denies SOB/ DOE ?Denies low blood pressure/hypotension ?Denies vision changes ?No LE Edema noted on exam ?Continue medication, Norvasc 2.5 mg, ASA 81 mg, Coreg 12.5 mg  ?Denies side effects ?Due for CPE and AWV ?Seek emergent care if you develop chest pain or chest pressure ? ?

## 2021-12-07 NOTE — Progress Notes (Signed)
?  ?I,Joseline E Rosas,acting as a scribe for [name], [name].,have documented all relevant documentation on the behalf of [name], [name],as directed by  [name], [name] while in the presence of [name], [name].  ? ?Established patient visit ? ?Patient: James Mora   DOB: [birth year]   86 y.o. Male  MRN: [id number] ?Visit Date: 12/07/2021 ? ?Today's healthcare provider: [name], [name]  ?Re Introduced to nurse practitioner role and practice setting.  All questions answered.  Discussed provider/patient relationship and expectations. ? ?Chief Complaint  ?Patient presents with  ? HTN follow-up  ? ?Subjective  ?  ?HPI  ?Hypertension, follow-up ? ?BP Readings from Last 3 Encounters:  ?12/07/21 119/67  ?11/20/21 100/60  ?10/16/21 120/70  ? Wt Readings from Last 3 Encounters:  ?12/07/21 157 lb 4.8 oz (71.4 kg)  ?11/20/21 159 lb 8 oz (72.3 kg)  ?10/16/21 162 lb 2 oz (73.5 kg)  ?  ? ?He was last seen for hypertension 3 months ago.  ?BP at that visit was 132/70. Management since that visit includes continue medication. ? ?He reports excellent compliance with treatment. ?He is not having side effects.  ?He is following a Regular diet. ?He is exercising. ?He does not smoke. ? ?Outside blood pressures are 110's/70. ?Symptoms: ?No chest pain No chest pressure  ?No palpitations No syncope  ?No dyspnea No orthopnea  ?No paroxysmal nocturnal dyspnea Yes lower extremity edema  ? ?Pertinent labs ?Lab Results  ?Component Value Date  ? CHOL 125 02/01/2021  ? HDL 49 02/01/2021  ? [place] 62 02/01/2021  ? TRIG 68 02/01/2021  ? CHOLHDL 2.6 02/01/2021  ? Lab Results  ?Component Value Date  ? NA 144 09/25/2021  ? K 4.3 09/25/2021  ? CREATININE 1.22 09/25/2021  ? EGFR 59 (L) 09/25/2021  ? GLUCOSE 105 (H) 09/25/2021  ? TSH 7.750 (H) 09/08/2021  ?  ? ?The ASCVD Risk score (Arnett DK, et al., 2019) failed to calculate for the following reasons: ?  The 2019 ASCVD risk score is only valid for ages 39 to 7 ?  The patient has a  prior MI or stroke diagnosis ? ?---------------------------------------------------------------------------------------------------  ? ?Medications: ?Outpatient Medications Prior to Visit  ?Medication Sig  ? acetaminophen (TYLENOL) 500 MG tablet Take 500 mg by mouth every 6 (six) hours as needed.  ? amLODipine (NORVASC) 2.5 MG tablet TAKE 1 TABLET(2.5 MG) BY MOUTH DAILY  ? aspirin 81 MG tablet Take 81 mg by mouth daily.  ? atorvastatin (LIPITOR) 20 MG tablet TAKE 1 TABLET(20 MG) BY MOUTH DAILY AT 6 PM  ? calcium carbonate (TUMS - DOSED IN MG ELEMENTAL CALCIUM) 500 MG chewable tablet Chew 1 tablet by mouth 2 (two) times daily as needed for indigestion or heartburn.  ? carvedilol (COREG) 12.5 MG tablet TAKE 1 TABLET(12.5 MG) BY MOUTH TWICE DAILY  ? CREON 36000-114000 units CPEP capsule TAKE 1 CAPSULE BY MOUTH THREE TIMES DAILY WITH MEALS  ? furosemide (LASIX) 20 MG tablet Take 1 tablet (20 mg total) by mouth daily.  ? nitroGLYCERIN (NITROSTAT) 0.4 MG SL tablet Place 0.4 mg under the tongue every 5 (five) minutes as needed.  ? omeprazole (PRILOSEC) 20 MG capsule TAKE 2 CAPSULES(40 MG) BY MOUTH DAILY  ? tamsulosin (FLOMAX) 0.4 MG CAPS capsule Take 1 capsule (0.4 mg total) by mouth daily.  ? Thiamine HCl (B-1) 100 MG TABS TAKE 1 TABLET BY MOUTH EVERY DAY  ? [DISCONTINUED] traZODone (DESYREL) 50 MG tablet TAKE 1/2  TO 1 TABLET(25 TO 50 MG) BY MOUTH AT BEDTIME AS NEEDED FOR SLEEP (Patient not taking: Reported on 12/07/2021)  ? ?No facility-administered medications prior to visit.  ? ? ?Review of Systems ? ? ?  Objective  ?  ?BP 119/67 (BP Location: Right Arm, Patient Position: Sitting, Cuff Size: Normal)   Pulse 67   Temp 97.7 ?F (36.5 ?C) (Oral)   Resp 16   Wt 157 lb 4.8 oz (71.4 kg)   BMI 22.57 kg/m?  ? ? ?Physical Exam ?Vitals and nursing note reviewed.  ?Constitutional:   ?   General: He is not in acute distress. ?   Appearance: Normal appearance. He is normal weight. He is not ill-appearing, toxic-appearing or  diaphoretic.  ?HENT:  ?   Head: Normocephalic and atraumatic.  ?Eyes:  ?   Pupils: Pupils are equal, round, and reactive to light.  ?Cardiovascular:  ?   Rate and Rhythm: Normal rate and regular rhythm.  ?   Pulses: Normal pulses.  ?   Heart sounds: Normal heart sounds.  ?Pulmonary:  ?   Effort: Pulmonary effort is normal.  ?   Breath sounds: Normal breath sounds.  ?Musculoskeletal:     ?   General: Normal range of motion.  ?   Cervical back: Normal range of motion.  ?Skin: ?   General: Skin is warm and dry.  ?   Capillary Refill: Capillary refill takes less than 2 seconds.  ?Neurological:  ?   General: No focal deficit present.  ?   Mental Status: He is alert and oriented to person, place, and time. Mental status is at baseline.  ?Psychiatric:     ?   Mood and Affect: Mood normal.     ?   Behavior: Behavior normal.     ?   Thought Content: Thought content normal.     ?   Judgment: Judgment normal.  ?  ? ?No results found for any visits on 12/07/21. ? Assessment & Plan  ?  ? ?Problem List Items Addressed This Visit   ? ?  ? Cardiovascular and Mediastinum  ? Aortic valve regurgitation - Primary  ?  Chronic, stable ?Was seen by Cards last month ?ECHO repeated ?Patient continues to improve every day ?Able to ambulate 6x- 5-10 minutes at a time ?Has resumed driving ? ? ?  ?  ? Primary hypertension  ?  Chronic, stable, soft at times ?Denies CP ?Denies SOB/ DOE ?Denies low blood pressure/hypotension ?Denies vision changes ?No LE Edema noted on exam ?Continue medication, Norvasc 2.5 mg, ASA 81 mg, Coreg 12.5 mg  ?Denies side effects ?Due for CPE and AWV ?Seek emergent care if you develop chest pain or chest pressure ? ?  ?  ?  ? Digestive  ? Gallstone pancreatitis  ?  Chronic, stable ?Patient without pain and has continued to improve his diet ?Strength and ADLs are improving ?Currently on Creon with meals and has PAP ?Has followed up with surgery for discussion regarding gallbladder removal; currently on hold for  cardiology follow up ? ?  ?  ? ? ? ?Return for annual examination.  ?   ? ?I, [name], [name], have reviewed all documentation for this visit. The documentation on 12/07/21 for the exam, diagnosis, procedures, and orders are all accurate and complete. ? ? ? ?[name], [name]  ?[place] ?[phone] (phone) ?[phone] (fax) ? ?[facility]  ?

## 2021-12-15 ENCOUNTER — Encounter: Payer: Self-pay | Admitting: Surgery

## 2021-12-15 ENCOUNTER — Ambulatory Visit (INDEPENDENT_AMBULATORY_CARE_PROVIDER_SITE_OTHER): Payer: Medicare Other | Admitting: Surgery

## 2021-12-15 VITALS — BP 130/76 | HR 78 | Temp 98.1°F | Wt 158.0 lb

## 2021-12-15 DIAGNOSIS — K8020 Calculus of gallbladder without cholecystitis without obstruction: Secondary | ICD-10-CM | POA: Diagnosis not present

## 2021-12-15 NOTE — Patient Instructions (Signed)
If you have any concerns or questions, please feel free to call our office. See follow up appointment below.  ? ? ?Gallbladder Eating Plan ? ?High blood cholesterol, obesity, a sedentary lifestyle, an unhealthy diet, and diabetes are risk factors for developing gallstones. ?If you have a gallbladder condition, you may have trouble digesting fats and tolerating high fat intake. Eating a low-fat diet can help reduce your symptoms and may be helpful before and after having surgery to remove your gallbladder (cholecystectomy). Your health care provider may recommend that you work with a dietitian to help you reduce the amount of fat in your diet. ?What are tips for following this plan? ?General guidelines ?Limit your fat intake to less than 30% of your total daily calories. If you eat around 1,800 calories each day, this means eating less than 60 grams (g) of fat per day. ?Fat is an important part of a healthy diet. Eating a low-fat diet can make it hard to maintain a healthy body weight. Ask your dietitian how much fat, calories, and other nutrients you need each day. ?Eat small, frequent meals throughout the day instead of three large meals. ?Drink at least 8-10 cups (1.9-2.4 L) of fluid a day. Drink enough fluid to keep your urine pale yellow. ?If you drink alcohol: ?Limit how much you have to: ?0-1 drink a day for women who are not pregnant. ?0-2 drinks a day for men. ?Know how much alcohol is in a drink. In the [place] one drink equals one 12 oz bottle of beer (355 mL), one 5 oz glass of wine (148 mL), or one 1? oz glass of hard liquor (44 mL). ?Reading food labels ? ?Check nutrition facts on food labels for the amount of fat per serving. Choose foods with less than 3 grams of fat per serving. ?Shopping ?Choose nonfat and low-fat healthy foods. Look for the words "nonfat," "low-fat," or "fat-free." ?Avoid buying processed or prepackaged foods. ?Cooking ?Cook using low-fat methods, such as baking, broiling, grilling,  or boiling. ?Cook with small amounts of healthy fats, such as olive oil, grapeseed oil, canola oil, avocado oil, or sunflower oil. ?What foods are recommended? ? ?All fresh, frozen, or canned fruits and vegetables. ?Whole grains. ?Low-fat or nonfat (skim) milk and yogurt. ?Lean meat, skinless poultry, fish, eggs, and beans. ?Low-fat protein supplement powders or drinks. ?Spices and herbs. ?The items listed above may not be a complete list of foods and beverages you can eat and drink. Contact a dietitian for more information. ?What foods are not recommended? ?High-fat foods. These include baked goods, fast food, fatty cuts of meat, ice cream, french toast, sweet rolls, pizza, cheese bread, foods covered with butter, creamy sauces, or cheese. ?Fried foods. These include french fries, tempura, battered fish, breaded chicken, fried breads, and sweets. ?Foods that cause bloating and gas. ?The items listed above may not be a complete list of foods that you should avoid. Contact a dietitian for more information. ?Summary ?A low-fat diet can be helpful if you have a gallbladder condition, or before and after gallbladder surgery. ?Limit your fat intake to less than 30% of your total daily calories. This is about 60 g of fat if you eat 1,800 calories each day. ?Eat small, frequent meals throughout the day instead of three large meals. ?This information is not intended to replace advice given to you by your health care provider. Make sure you discuss any questions you have with your health care provider. ?Document Revised: 07/10/2021 Document Reviewed: 07/10/2021 ?  Elsevier Patient Education ? [place]. ? ?

## 2021-12-15 NOTE — Progress Notes (Signed)
This gentleman is doing quite well today, we even wonder if he needs to continue the Creon, as his wife has diminished the dose and has not seen any change in the apparent digestion.  His stools have been regular, without evidence of undigested food.  Denies any abdominal pain or discomfort.  Is pretty much eating what he desires. ?On abdominal exam today his abdomen is soft and benign nontender.  Bigger complaints concerned degree of foot issues, his wife's upcoming eye surgery, etc. ?Current consensus is to defer any consideration of cholecystectomy at this time, and return in 1 month. ?I believe they can continue to decrease the Creon or trial not utilizing it at all and determine how his bowel habits are and how his digestive processes work now. ?He is regaining some strength then, we prefer to see this kind of progress prior to any setback another operation could bring. ? ?We will have them return in a month and rediscuss potential plans for surgery at that time.  We will be glad to see them back in the interim for any reason. ?

## 2022-01-05 ENCOUNTER — Other Ambulatory Visit: Payer: Self-pay | Admitting: Family Medicine

## 2022-01-05 DIAGNOSIS — K219 Gastro-esophageal reflux disease without esophagitis: Secondary | ICD-10-CM

## 2022-01-07 ENCOUNTER — Other Ambulatory Visit: Payer: Self-pay | Admitting: Family Medicine

## 2022-01-11 ENCOUNTER — Telehealth: Payer: Self-pay

## 2022-01-11 NOTE — Progress Notes (Signed)
Chronic Care Management Pharmacy Assistant   Name: James Mora  MRN: [id number] DOB: 06/20/1936  Reason for Encounter: Hypertension Disease State Call.   Recent office visits:  12/07/2021 James Joe [name] (PCP) No Medication changes noted  Recent consult visits:  12/15/2021 Dr. Christian Mate MD (General Surgery) No Medication Changes noted 11/20/2021 James Kathlen Mody PA-C (Cardiology) Increase lasix to '20mg'$  BID x 3 days then back down to '20mg'$  daily, Follow up in 3 month  Hospital visits:  None in previous 6 months  Medications: Outpatient Encounter Medications as of 01/11/2022  Medication Sig   acetaminophen (TYLENOL) 500 MG tablet Take 500 mg by mouth every 6 (six) hours as needed.   amLODipine (NORVASC) 2.5 MG tablet TAKE 1 TABLET(2.5 MG) BY MOUTH DAILY   aspirin 81 MG tablet Take 81 mg by mouth daily.   atorvastatin (LIPITOR) 20 MG tablet TAKE 1 TABLET(20 MG) BY MOUTH DAILY AT 6 PM   calcium carbonate (TUMS - DOSED IN MG ELEMENTAL CALCIUM) 500 MG chewable tablet Chew 1 tablet by mouth 2 (two) times daily as needed for indigestion or heartburn.   carvedilol (COREG) 12.5 MG tablet TAKE 1 TABLET(12.5 MG) BY MOUTH TWICE DAILY   CREON 36000-114000 units CPEP capsule TAKE 1 CAPSULE BY MOUTH THREE TIMES DAILY WITH MEALS   furosemide (LASIX) 20 MG tablet Take 1 tablet (20 mg total) by mouth daily.   nitroGLYCERIN (NITROSTAT) 0.4 MG SL tablet Place 0.4 mg under the tongue every 5 (five) minutes as needed.   omeprazole (PRILOSEC) 20 MG capsule TAKE 2 CAPSULES(40 MG) BY MOUTH DAILY   tamsulosin (FLOMAX) 0.4 MG CAPS capsule TAKE 1 CAPSULE(0.4 MG) BY MOUTH DAILY   Thiamine HCl (B-1) 100 MG TABS TAKE 1 TABLET BY MOUTH EVERY DAY   [DISCONTINUED] lovastatin (ALTOPREV) 40 MG 24 hr tablet Take 80 mg by mouth at bedtime.     [DISCONTINUED] simvastatin (ZOCOR) 40 MG tablet Take 40 mg by mouth at bedtime.     No facility-administered encounter medications on file as of 01/11/2022.    Care  Gaps: Tetanus Vaccine Shingrix Vaccine Pneumonia Vaccine (Last Completed 02/05/2020)   Star Rating Drugs: Atorvastatin 20 mg  last filled 10/15/2021 90 day supply at [facility].   Medication Fill Gaps: None  Reviewed chart prior to disease state call. Spoke with patient regarding BP  Recent Office Vitals: BP Readings from Last 3 Encounters:  12/15/21 130/76  12/07/21 119/67  11/20/21 100/60   Pulse Readings from Last 3 Encounters:  12/15/21 78  12/07/21 67  11/20/21 97    Wt Readings from Last 3 Encounters:  12/15/21 158 lb (71.7 kg)  12/07/21 157 lb 4.8 oz (71.4 kg)  11/20/21 159 lb 8 oz (72.3 kg)     Kidney Function Lab Results  Component Value Date/Time   CREATININE 1.22 09/25/2021 02:14 PM   CREATININE 1.18 09/08/2021 10:33 AM   CREATININE 1.43 (H) 06/20/2017 09:59 AM   GFRNONAA >60 02/27/2021 02:16 AM   GFRNONAA 46 (L) 06/20/2017 09:59 AM   GFRAA 46 (L) 02/05/2020 02:47 PM   GFRAA 54 (L) 06/20/2017 09:59 AM       Latest Ref Rng & Units 09/25/2021    2:14 PM 09/08/2021   10:33 AM 03/24/2021    9:02 AM  BMP  Glucose 70 - 99 mg/dL 105   105   122    BUN 8 - 27 mg/dL '20   20   23    '$ Creatinine 0.76 - 1.27 mg/dL  1.22   1.18   1.32    BUN/Creat Ratio 10 - '24 16   17   17    '$ Sodium 134 - 144 mmol/L 144   143   142    Potassium 3.5 - 5.2 mmol/L 4.3   4.3   3.9    Chloride 96 - 106 mmol/L 108   106   105    CO2 20 - 29 mmol/L '25   23   24    '$ Calcium 8.6 - 10.2 mg/dL 8.9   8.8   8.7      Current antihypertensive regimen:  Amlodipine 2.5 mg daily Carvedilol 12.5 mg twice daily Furosemide 20 mg daily   What recent interventions/DTPs have been made by any provider to improve Blood Pressure control since last CPP Visit: None ID  Any recent hospitalizations or ED visits since last visit with CPP? No  I have attempted without success to contact this patient by phone three times to do his hypertension Disease State call. I left a Voice message for  patient to return my call.   Adherence Review: Is the patient currently on ACE/ARB medication? No Does the patient have >5 day gap between last estimated fill dates? No   Anderson Malta Clinical Production designer, theatre/television/film [phone]

## 2022-01-19 ENCOUNTER — Other Ambulatory Visit: Payer: Self-pay

## 2022-01-19 ENCOUNTER — Encounter: Payer: Self-pay | Admitting: Surgery

## 2022-01-19 ENCOUNTER — Ambulatory Visit: Payer: Medicare Other | Admitting: Surgery

## 2022-01-19 DIAGNOSIS — K8020 Calculus of gallbladder without cholecystitis without obstruction: Secondary | ICD-10-CM

## 2022-01-19 NOTE — Progress Notes (Signed)
This gentleman is doing quite well today, he has diminished his utilization of Creon.  Reports he has not seen any change in the apparent digestion.  His stools have been regular, without evidence of undigested food.  Denies any abdominal pain or discomfort.  Is pretty much eating what he desires. On abdominal exam today his abdomen is soft and benign nontender.   Current consensus is to defer any consideration of cholecystectomy at this time, and return late July after follow-up with his cardiologist for consideration of preoperative clearance. I believe they can continue to decrease the Creon or trial not utilizing it at all and determine how his bowel habits are and how his digestive processes work now. He is regaining some strength then, we prefer to see this kind of progress prior to any setback another operation could bring. Right upper quadrant ultrasound to evaluate current anatomy. We will have them return and rediscuss potential plans for surgery at that time.  We will be glad to see them back in the interim for any reason.

## 2022-01-19 NOTE — Patient Instructions (Addendum)
Ultrasound scheduled 01/22/22 @ [facility] @ 8:15 am. Nothing to [place] after Midnight.   Please see your follow up appointment listed below.     Cholelithiasis  Cholelithiasis is a disease in which gallstones form in the gallbladder. The gallbladder is an organ that stores bile. Bile is a fluid that helps to digest fats. Gallstones begin as small crystals and can slowly grow into stones. They may cause no symptoms until they block the gallbladder duct, or cystic duct, when the gallbladder tightens (contracts) after food is eaten. This can cause pain and is known as a gallbladder attack, or biliary colic. There are two main types of gallstones: Cholesterol stones. These are the most common type of gallstone. These stones are made of hardened cholesterol and are usually yellow-green in color. Cholesterol is a fat-like substance that is made in the liver. Pigment stones. These are dark in color and are made of a red-yellow substance, called bilirubin,that forms when hemoglobin from red blood cells breaks down. What are the causes? This condition may be caused by an imbalance in the different parts that make bile. This can happen if the bile: Has too much bilirubin. This can happen in certain blood diseases, such as sickle cell anemia. Has too much cholesterol. Does not have enough bile salts. These salts help the body absorb and digest fats. In some cases, this condition can also be caused by the gallbladder not emptying completely or often enough. This is common during pregnancy. What increases the risk? The following factors may make you more likely to develop this condition: Being male. Having multiple pregnancies. Health care providers sometimes advise removing diseased gallbladders before future pregnancies. Eating a diet that is heavy in fried foods, fat, and refined carbohydrates, such as white bread and white rice. Being obese. Being older than age 67. Using medicines that  contain male hormones (estrogen) for a long time. Losing weight quickly. Having a family history of gallstones. Having certain medical problems, such as: Diabetes mellitus. Cystic fibrosis. Crohn's disease. Cirrhosis or other long-term (chronic) liver disease. Certain blood diseases, such as sickle cell anemia or leukemia. What are the signs or symptoms? In many cases, having gallstones causes no symptoms. When you have gallstones but do not have symptoms, you have silent gallstones. If a gallstone blocks your bile duct, it can cause a gallbladder attack. The main symptom of a gallbladder attack is sudden pain in the upper right part of the abdomen. The pain: Usually comes at night or after eating. Can last for one hour or more. Can spread to your right shoulder, back, or chest. Can feel like indigestion. This is discomfort, burning, or fullness in your upper abdomen. If the bile duct is blocked for more than a few hours, it can cause an infection or inflammation of your gallbladder (cholecystitis), liver, or pancreas. This can cause: Nausea or vomiting. Bloating. Pain in your abdomen that lasts for 5 hours or longer. Tenderness in your upper abdomen, often in the upper right section and under your rib cage. Fever or chills. Skin or the white parts of your eyes turning yellow (jaundice). This usually happens when a stone has blocked bile from passing through the common bile duct. Dark urine or light-colored stools. How is this diagnosed? This condition may be diagnosed based on: A physical exam. Your medical history. Ultrasound. CT scan. MRI. You may also have other tests, including: Blood tests to check for signs of an infection or inflammation. Cholescintigraphy, or HIDA scan.  This is a scan of your gallbladder and bile ducts (biliary system) using non-harmful radioactive material and special cameras that can see the radioactive material. Endoscopic retrograde  cholangiopancreatogram. This involves inserting a small tube with a camera on the end (endoscope) through your mouth to look at bile ducts and check for blockages. How is this treated? Treatment for this condition depends on the severity of the condition. Silent gallstones do not need treatment. Treatment may be needed if a blockage causes a gallbladder attack or other symptoms. Treatment may include: Home care, if symptoms are not severe. During a simple gallbladder attack, stop eating and drinking for 12-24 hours (except for water and clear liquids). This helps to "cool down" your gallbladder. After 1 or 2 days, you can start to eat a diet of simple or clear foods, such as broths and crackers. You may also need medicines for pain or nausea or both. If you have cholecystitis and an infection, you will need antibiotics. A hospital stay, if needed for pain control or for cholecystitis with severe infection. Cholecystectomy, or surgery to remove your gallbladder. This is the most common treatment if all other treatments have not worked. Medicines to break up gallstones. These are most effective at treating small gallstones. Medicines may be used for up to 6-12 months. Endoscopic retrograde cholangiopancreatogram. A small basket can be attached to the endoscope and used to capture and remove gallstones, mainly those that are in the common bile duct. Follow these instructions at home: Medicines Take over-the-counter and prescription medicines only as told by your health care provider. If you were prescribed an antibiotic medicine, take it as told by your health care provider. Do not stop taking the antibiotic even if you start to feel better. Ask your health care provider if the medicine prescribed to you requires you to avoid driving or using machinery. Eating and drinking Drink enough fluid to keep your urine pale yellow. This is important during a gallbladder attack. Water and clear liquids are  preferred. Follow a healthy diet. This includes: Reducing fatty foods, such as fried food and foods high in cholesterol. Reducing refined carbohydrates, such as white bread and white rice. Eating more fiber. Aim for foods such as almonds, fruit, and beans. Alcohol use If you drink alcohol: Limit how much you use to: 0-1 drink a day for nonpregnant women. 0-2 drinks a day for men. Be aware of how much alcohol is in your drink. In the [place] one drink equals one 12 oz bottle of beer (355 mL), one 5 oz glass of wine (148 mL), or one 1 oz glass of hard liquor (44 mL). General instructions Do not use any products that contain nicotine or tobacco, such as cigarettes, e-cigarettes, and chewing tobacco. If you need help quitting, ask your health care provider. Maintain a healthy weight. Keep all follow-up visits as told by your health care provider. These may include consultations with a surgeon or specialist. This is important. Where to find more information [employer] of Diabetes and Digestive and Kidney Diseases: [email or web address] Contact a health care provider if: You think you have had a gallbladder attack. You have been diagnosed with silent gallstones and you develop pain in your abdomen or indigestion. You begin to have attacks more often. You have dark urine or light-colored stools. Get help right away if: You have pain from a gallbladder attack that lasts for more than 2 hours. You have pain in your abdomen that lasts for more than 5 hours or  is getting worse. You have a fever or chills. You have nausea and vomiting that do not go away. You develop jaundice. Summary Cholelithiasis is a disease in which gallstones form in the gallbladder. This condition may be caused by an imbalance in the different parts that make bile. This can happen if your bile has too much bilirubin or cholesterol, or does not have enough bile salts. Treatment for gallstones depends on the severity of  the condition. Silent gallstones do not need treatment. If gallstones cause a gallbladder attack or other symptoms, treatment usually involves not eating or drinking anything. Treatment may also include pain medicines and antibiotics, and it sometimes includes a hospital stay. Surgery to remove the gallbladder is common if all other treatments have not worked. This information is not intended to replace advice given to you by your health care provider. Make sure you discuss any questions you have with your health care provider. Document Revised: 06/18/2019 Document Reviewed: 06/18/2019 Elsevier Patient Education  [place].

## 2022-01-22 ENCOUNTER — Ambulatory Visit
Admission: RE | Admit: 2022-01-22 | Discharge: 2022-01-22 | Disposition: A | Discharge status: Home / Self Care | Payer: Medicare Other | Source: Ambulatory Visit | Attending: Surgery | Admitting: Surgery

## 2022-01-22 DIAGNOSIS — K8020 Calculus of gallbladder without cholecystitis without obstruction: Secondary | ICD-10-CM | POA: Insufficient documentation

## 2022-02-11 ENCOUNTER — Telehealth: Payer: Self-pay | Admitting: Medical

## 2022-02-11 NOTE — Telephone Encounter (Signed)
3 ATTEMPTS TO RESCHEDULE APPT 7/14 .  UNABLE TO REACH PATIENT .  CLOSING ENCOUNTER

## 2022-02-19 ENCOUNTER — Ambulatory Visit: Payer: Medicare Other | Admitting: Medical

## 2022-02-23 ENCOUNTER — Other Ambulatory Visit: Payer: Self-pay

## 2022-02-23 ENCOUNTER — Other Ambulatory Visit: Payer: Self-pay | Admitting: Family Medicine

## 2022-02-23 ENCOUNTER — Ambulatory Visit: Payer: Medicare Other | Admitting: Surgery

## 2022-02-23 ENCOUNTER — Encounter: Payer: Self-pay | Admitting: Surgery

## 2022-02-23 VITALS — BP 137/86 | HR 90 | Temp 98.2°F | Ht 70.0 in | Wt 164.6 lb

## 2022-02-23 DIAGNOSIS — K8020 Calculus of gallbladder without cholecystitis without obstruction: Secondary | ICD-10-CM

## 2022-02-23 DIAGNOSIS — I10 Essential (primary) hypertension: Secondary | ICD-10-CM

## 2022-02-23 NOTE — Progress Notes (Signed)
Surgical Clinic Progress/Follow-up Note   HPI:  86 y.o. Male presents to clinic for history of severe gallstone pancreatitis, marked disability and persistence of gallbladder with cholelithiasis follow-up.   Its been a month since the last evaluation. Patient reports  improvement/resolution of prior issues and has been tolerating diet with +flatus and normal BM's, denies N/V, fever/chills, CP, or SOB. No longer taking any pancreatic enzymes, continuing to be conscientious regarding fats.  Denying any abdominal pain or discomfort.  Regaining strength and walking more.  Anticipating vacation to beach.  Accompanied today by his wife as usual.  Review of Systems:  Constitutional: denies fever/chills  ENT: denies sore throat, hearing problems  Respiratory: denies shortness of breath, wheezing  Cardiovascular: denies chest pain, palpitations  Gastrointestinal: denies abdominal pain, N/V, or diarrhea/and bowel function as per interval history Skin: Denies any other rashes or skin discolorations as per interval history  Vital Signs:  BP 137/86   Pulse 90   Temp 98.2 F (36.8 C) (Oral)   Ht '5\' 10"'$  (1.778 m)   Wt 164 lb 9.6 oz (74.7 kg)   SpO2 99%   BMI 23.62 kg/m    Physical Exam:  Constitutional:  -- Nomal body habitus  -- Awake, alert, and oriented x3  Pulmonary:  -- No crackles -- Equal breath sounds bilaterally -- Breathing non-labored at rest Cardiovascular:  -- S1, S2 present  -- No pericardial rubs  Gastrointestinal:  -- Soft and non-distended, non-tender/with no tenderness to palpation, no guarding/rebound tenderness -- No abdominal masses appreciated, pulsatile or otherwise   Musculoskeletal / Integumentary:  -- Wounds or skin discoloration: None appreciated -- Extremities: B/L UE and LE well perfused, hands and feet warm, no edema   Laboratory studies: No new pertinent labs ordered.  Imaging: No new pertinent imaging available for review   Assessment:  86 y.o. yo  Male with a problem list including...  Patient Active Problem List   Diagnosis Date Noted   Primary hypertension 12/07/2021   Aortic valve regurgitation 12/07/2021   PND (paroxysmal nocturnal dyspnea) 09/08/2021   Orthopnea 09/08/2021   Venous insufficiency of right leg 09/08/2021   Screening for prostate cancer 09/08/2021   Elevated TSH 09/08/2021   Iron deficiency anemia secondary to inadequate dietary iron intake 09/08/2021   Insomnia 06/05/2021   Exercise intolerance 06/05/2021   Constipation 03/31/2021   Hypotension due to hypovolemia 03/24/2021   Dehydration 03/24/2021   Stage 2 chronic kidney disease 02/20/2021   Lactic acidosis 02/20/2021   Acute respiratory failure with hypoxia ([place]) 02/20/2021   Gallstone pancreatitis    Shortness of breath    Gallstones    Enteritis    Acute pancreatitis 02/01/2021   Narrowing of intervertebral disc space 06/18/2016   Elevated prostate specific antigen (PSA) 06/18/2016   Benign prostatic hyperplasia without lower urinary tract symptoms 06/18/2016   HLD (hyperlipidemia)    Mixed hyperlipidemia    CAD (coronary artery disease)    Coronary artery disease involving native coronary artery of native heart with angina pectoris with documented spasm (HCC)    NSTEMI (non-ST elevated myocardial infarction) ([place]) 03/19/2016   HTN (hypertension)    High blood cholesterol level    Acid reflux    Hyperlipidemia 12/27/2008   Essential hypertension 12/27/2008   Coronary atherosclerosis 12/27/2008   Aortic valve disorder 12/27/2008   Esophagitis, reflux 06/06/2006   Hypertensive heart disease 08/10/2003    presents to clinic for follow-up evaluation of persistence of cholelithiasis within the gallbladder with history of severe  pancreatitis, progressing well.  Plan:              - return to clinic in 2 months or as needed, instructed to call office if any questions or concerns  -Once again we reiterated the potential for recurrent biliary  pancreatitis, weighing the benefits of being proactive versus the risks of surgery in this gentleman.  At present as he is doing so well with his rehab and regaining his appetite his GI function and his strength, he desires to defer elective surgery at this time.  I believe the risks are understood, and we will defer consideration of cholecystectomy for another 2 months or should symptoms change.  All of the above recommendations were discussed with the patient and patient's family, and all of patient's and family's questions were answered to their expressed satisfaction.  These notes generated with voice recognition software. I apologize for typographical errors.  [name], MD, FACS [place]: [facility] exceptional [facility]. Office: [phone]

## 2022-02-23 NOTE — Patient Instructions (Signed)
Please call the office if you have questions or concerns.

## 2022-02-24 ENCOUNTER — Encounter: Payer: Self-pay | Admitting: Medical

## 2022-02-24 ENCOUNTER — Ambulatory Visit: Payer: Medicare Other | Admitting: Medical

## 2022-02-24 VITALS — BP 145/84 | HR 88 | Ht 70.0 in | Wt 168.9 lb

## 2022-02-24 DIAGNOSIS — E782 Mixed hyperlipidemia: Secondary | ICD-10-CM | POA: Diagnosis not present

## 2022-02-24 DIAGNOSIS — I5032 Chronic diastolic (congestive) heart failure: Secondary | ICD-10-CM

## 2022-02-24 DIAGNOSIS — I10 Essential (primary) hypertension: Secondary | ICD-10-CM | POA: Diagnosis not present

## 2022-02-24 DIAGNOSIS — I351 Nonrheumatic aortic (valve) insufficiency: Secondary | ICD-10-CM

## 2022-02-24 DIAGNOSIS — I2510 Atherosclerotic heart disease of native coronary artery without angina pectoris: Secondary | ICD-10-CM | POA: Diagnosis not present

## 2022-02-24 NOTE — Patient Instructions (Signed)
Medication Instructions:  Your physician recommends that you continue on your current medications as directed. Please refer to the Current Medication list given to you today.  *If you need a refill on your cardiac medications before your next appointment, please call your pharmacy*  Lab Work: NONE ordered at this time of appointment   If you have labs (blood work) drawn today and your tests are completely normal, you will receive your results only by: [facility] (if you have MyChart) OR A paper copy in the mail If you have any lab test that is abnormal or we need to change your treatment, we will call you to review the results.   Testing/Procedures: NONE ordered at this time of appointment   Follow-Up: At [facility], you and your health needs are our priority.  As part of our continuing mission to provide you with exceptional heart care, we have created designated Provider Care Teams.  These Care Teams include your primary Cardiologist (physician) and Advanced Practice Providers (APPs -  Physician Assistants and Nurse Practitioners) who all work together to provide you with the care you need, when you need it.  Your next appointment:   6 month(s)  The format for your next appointment:   In Person  Provider:   You may see [name], MD or one of the following Advanced Practice Providers on your designated Care Team:   Murray Hodgkins, NP Christell Faith, PA-C Cadence Kathlen Mody, [place]    Other Instructions   Important Information About Sugar

## 2022-02-24 NOTE — Progress Notes (Signed)
Cardiology Office Note:    Date:  02/24/2022   ID:  James Mora, DOB 1936/02/11, MRN [id number]  PCP:  [name], [name]  [facility] Cardiologist: Dr. [facility] Electrophysiologist:  None   Referring MD: [name], [name]   Chief Complaint: 3 month follow-up  History of Present Illness:    James Mora is a 86 y.o. male with a hx of CAD, aortic insuffiencey, hypertensive heart disease, CKD stage, HLD who presents for 4 week follow-up.     Cardiac catheterization in 2000 showed severe distal LAD disease with left to left collaterals that was managed medically at that time.  He was hospitalized in August 2017 with non-ST elevation myocardial infarction. LHC showed mild proximal LAD disease with 30% stenosis and possible occlusion of a small PL 3.  Ejection fraction was normal.  Medical management was recommended.  Echocardiogram showed normal LV systolic function with stable mild to moderate aortic insufficiency.    He had significant leg edema that improved after stopping amlodipine.     He was hospitalized in July 2022 with acute pancreatitis with gallstones and enteritis.  The patient had peripancreatic edema resulting in significant abdominal pain.  He had an MRCP done which showed no evidence of stones but there was tapering of the common bile duct. The patient was transferred to [facility] where he stayed there for another week and then he was discharged to rehab.  The plan was to proceed with cholecystectomy but giving ongoing issues with abdominal pain, that has not happened yet.   Seen 09/17/21 for pre-op cardiovascular evaluation for cholecystectomy. He was 8-10lbs up. Echo was ordered and lasix 20 mg daily. Echo showed LVEF 50-55%, no WMA, G1DD, mild MR, mild artic root dilation measuring 102m, frequent PCs.   Last seen 11/20/21 and was overall doing well. BP was soft. Due to trace edema on exam, lasix was increased for 3 days.   Today, the patient reports he is doing  well. He is following with surgery for possible gallbladder removal for gallstones. Denies chest pain, shortness of breath. He has stable lower leg edema. No orthopnea or pnd. He is eating and drinking normally. He is walking every hour. BP is a little high. At home it's 120/70.   Past Medical History:  Diagnosis Date   Acid reflux    Aortic insufficiency    a. 03/2016 Echo: EF 55-60%, no rwma, Gr1 DD, mild to mod AI, mild TR.   CAD (coronary artery disease)    a. 2000 Cath: Severe distal LAD disease with left to left collaterals;  b. 2008 MV: non-ischemic; c. 03/2016 Cath: LM nl, LAD 30p, D1/D2 mod, nl, LCX nl, OM1/2 nl w/ L-->L collats to apical LAD, RCA dominant, nl. EF 55-65%.   Hypertensive heart disease 2005   unspecified   Mixed hyperlipidemia    mixed    Past Surgical History:  Procedure Laterality Date   CARDIAC CATHETERIZATION  2000   left heart   CARDIAC CATHETERIZATION N/A 03/22/2016   Procedure: Left Heart Cath And Coronary Angiography;  Surgeon: TMinna Merritts MD;  [facility];  Service: Cardiovascular;  Laterality: N/A;   COLONOSCOPY  2008   Dr. WAllen Norris   Current Medications: Current Meds  Medication Sig   acetaminophen (TYLENOL) 500 MG tablet Take 500 mg by mouth every 6 (six) hours as needed.   amLODipine (NORVASC) 2.5 MG tablet TAKE 1 TABLET(2.5 MG) BY MOUTH DAILY   aspirin  81 MG tablet Take 81 mg by mouth daily.   atorvastatin (LIPITOR) 20 MG tablet TAKE 1 TABLET(20 MG) BY MOUTH DAILY AT 6 PM   calcium carbonate (TUMS - DOSED IN MG ELEMENTAL CALCIUM) 500 MG chewable tablet Chew 1 tablet by mouth 2 (two) times daily as needed for indigestion or heartburn.   carvedilol (COREG) 12.5 MG tablet TAKE 1 TABLET(12.5 MG) BY MOUTH TWICE DAILY   CREON 36000-114000 units CPEP capsule TAKE 1 CAPSULE BY MOUTH THREE TIMES DAILY WITH MEALS   furosemide (LASIX) 20 MG tablet Take 1 tablet (20 mg total) by mouth daily.   nitroGLYCERIN (NITROSTAT) 0.4 MG SL tablet  Place 0.4 mg under the tongue every 5 (five) minutes as needed.   omeprazole (PRILOSEC) 20 MG capsule TAKE 2 CAPSULES(40 MG) BY MOUTH DAILY   tamsulosin (FLOMAX) 0.4 MG CAPS capsule TAKE 1 CAPSULE(0.4 MG) BY MOUTH DAILY   Thiamine HCl (B-1) 100 MG TABS TAKE 1 TABLET BY MOUTH EVERY DAY     Allergies:   Imodium [loperamide] and Hydroxyzine hcl   Social History   Socioeconomic History   Marital status: Married    Spouse name: Not on file   Number of children: 1   Years of education: Not on file   Highest education level: Not on file  Occupational History   Not on file  Tobacco Use   Smoking status: Never   Smokeless tobacco: Never  Vaping Use   Vaping Use: Never used  Substance and Sexual Activity   Alcohol use: No   Drug use: No   Sexual activity: Not on file  Other Topics Concern   Not on file  Social History Narrative   Not on file   Social Determinants of Health   Financial Resource Strain: Low Risk  (09/29/2021)   Overall Financial Resource Strain (CARDIA)    Difficulty of Paying Living Expenses: Not hard at all  Food Insecurity: No Food Insecurity (04/18/2021)   Hunger Vital Sign    Worried About Running Out of Food in the Last Year: Never true    Ran Out of Food in the Last Year: Never true  Transportation Needs: No Transportation Needs (04/18/2021)   PRAPARE - Hydrologist (Medical): No    Lack of Transportation (Non-Medical): No  Physical Activity: Insufficiently Active (04/18/2021)   Exercise Vital Sign    Days of Exercise per Week: 1 day    Minutes of Exercise per Session: 10 min  Stress: No Stress Concern Present (04/18/2021)   [place]    Feeling of Stress : Only a little  Social Connections: Unknown (04/18/2021)   Social Connection and Isolation Panel [NHANES]    Frequency of Communication with Friends and Family: More than three times a week    Frequency of  Social Gatherings with Friends and Family: Patient refused    Attends Religious Services: Not on Advertising copywriter or Organizations: No    Attends Archivist Meetings: Never    Marital Status: Married     Family History: The patient's Family history is unknown by patient.  ROS:   Please see the history of present illness.     All other systems reviewed and are negative.  EKGs/Labs/Other Studies Reviewed:    The following studies were reviewed today:  Echo 10/09/21  1. Left ventricular ejection fraction, by estimation, is 50 to 55%. The  left ventricle  has low normal function. The left ventricle has no regional  wall motion abnormalities. The left ventricular internal cavity size was  mildly dilated. There is mild  left ventricular hypertrophy. Left ventricular diastolic parameters are  consistent with Grade I diastolic dysfunction (impaired relaxation).   2. Right ventricular systolic function is normal. The right ventricular  size is normal. There is normal pulmonary artery systolic pressure. The  estimated right ventricular systolic pressure is 34.1 mmHg.   3. Left atrial size was mildly dilated.   4. The mitral valve is normal in structure. Mild mitral valve  regurgitation. No evidence of mitral stenosis.   5. The aortic valve is tricuspid. Aortic valve regurgitation is moderate.  Aortic valve sclerosis is present, with no evidence of aortic valve  stenosis.   6. The inferior vena cava is normal in size with greater than 50%  respiratory variability, suggesting right atrial pressure of 3 mmHg.   7. There is mild dilatation of the aortic root, measuring 41 mm. There is  mild dilatation of the ascending aorta, measuring 41 mm.   8. Frequent PVCs   Comparison(s): EF 70-75%; Aorta 4.1 cm.    Echo 02/03/21  1. Left ventricular ejection fraction, by estimation, is 70 to 75%. The  left ventricle has hyperdynamic function. The left ventricle has no   regional wall motion abnormalities. Left ventricular diastolic parameters  were normal.   2. Right ventricular systolic function is normal. The right ventricular  size is mildly enlarged. Tricuspid regurgitation signal is inadequate for  assessing PA pressure.   3. The mitral valve is grossly normal. No evidence of mitral valve  regurgitation. No evidence of mitral stenosis.   4. The aortic valve is tricuspid. Aortic valve regurgitation is mild.  Mild aortic valve sclerosis is present, with no evidence of aortic valve  stenosis.   5. Aortic dilatation noted. There is mild dilatation of the aortic root,  measuring 41 mm.    Cardiac cath 2017 Left Heart Cath And Coronary Angiography    Conclusion   Prox LAD lesion, 30 %stenosed.  EKG:  EKG is  ordered today.  The ekg ordered today demonstrates NSR 86bpm, RBBB, nonspecific T wave changes  Recent Labs: 02/27/2021: B Natriuretic Peptide 149.7 09/08/2021: ALT 8; Hemoglobin 10.0; Platelets 234; TSH 7.750 09/25/2021: BUN 20; Creatinine, Ser 1.22; Potassium 4.3; Sodium 144  Recent Lipid Panel    Component Value Date/Time   CHOL 125 02/01/2021 1849   CHOL 125 01/02/2021 0809   TRIG 68 02/01/2021 1849   HDL 49 02/01/2021 1849   HDL 45 01/02/2021 0809   CHOLHDL 2.6 02/01/2021 1849   VLDL 14 02/01/2021 1849   LDLCALC 62 02/01/2021 1849   LDLCALC 64 01/02/2021 0809   LDLCALC 58 06/20/2017 0959    Physical Exam:    VS:  BP (!) 145/84   Pulse 88   Ht '5\' 10"'$  (1.778 m)   Wt 168 lb 14.4 oz (76.6 kg)   SpO2 95%   BMI 24.23 kg/m     Wt Readings from Last 3 Encounters:  02/24/22 168 lb 14.4 oz (76.6 kg)  02/23/22 164 lb 9.6 oz (74.7 kg)  12/15/21 158 lb (71.7 kg)     GEN:  Well nourished, well developed in no acute distress HEENT: Normal NECK: No JVD; No carotid bruits LYMPHATICS: No lymphadenopathy CARDIAC: RRR, + murmur, no rubs, gallops RESPIRATORY:  Clear to auscultation without rales, wheezing or rhonchi  ABDOMEN: Soft,  non-tender, non-distended MUSCULOSKELETAL:  No  edema; No deformity  SKIN: Warm and dry NEUROLOGIC:  Alert and oriented x 3 PSYCHIATRIC:  Normal affect   ASSESSMENT:    1. Coronary artery disease involving native coronary artery of native heart without angina pectoris   2. Chronic diastolic heart failure ([place])   3. Hyperlipidemia, mixed   4. Essential hypertension   5. Aortic valve insufficiency, etiology of cardiac valve disease unspecified    PLAN:    In order of problems listed above:  Chronic diastolic heart failure Patient is euvolemic on exam today. He reports lower leg edema at times. He is on lasix '20mg'$  daily. Continue coreg.   CAD No chest pain reported. Continue Aspirin, statin and BB therapy. No ischemic work-up indicated at this time.   HLD LDL 62 01/2021. Continue statin therapy.   HTN BP is a little high, but he reports it is better at home. Continue current medications.   Aortic insufficieny  Echo 10/2021 showed normal LVEF with G1DD, mild MR, moderate AI, and mild dilation of the aortic root measuring 50m. Plan for annual echocardiograms.   Disposition: Follow up in 6 month(s) with MD/APP    Signed, Shaleta Ruacho HNinfa Meeker PA-C  02/24/2022 2:54 PM    [facility]

## 2022-03-26 ENCOUNTER — Telehealth: Payer: Self-pay

## 2022-03-26 NOTE — Progress Notes (Signed)
Chronic Care Management APPOINTMENT REMINDER   James Mora was reminded to have all medications, supplements and any blood glucose and blood pressure readings available for review with Junius Argyle, Pharm. D, at his telephone visit on 03/29/2022 at 3:45 pm.  Patient wife confirm appointment.  [place] Pharmacist Assistant [phone]

## 2022-03-29 ENCOUNTER — Ambulatory Visit (INDEPENDENT_AMBULATORY_CARE_PROVIDER_SITE_OTHER): Payer: Medicare Other

## 2022-03-29 DIAGNOSIS — I10 Essential (primary) hypertension: Secondary | ICD-10-CM

## 2022-03-29 DIAGNOSIS — E782 Mixed hyperlipidemia: Secondary | ICD-10-CM

## 2022-03-29 NOTE — Progress Notes (Unsigned)
Chronic Care Management Pharmacy Note  03/30/2022 Name:  James Mora MRN:  [id number] DOB:  12-02-1935  Summary: Patient presents for CCM follow-up. Patient doing well, home blood pressures well controlled.  Recommendations/Changes made from today's visit: Continue current medications  Plan: No further follow up required: Patient will reach out for further clinical concerns.  Subjective: James Mora is an 86 y.o. year old male who is a primary patient of [name], [name].  The CCM team was consulted for assistance with disease management and care coordination needs.    Engaged with patient by telephone for follow up visit in response to provider referral for pharmacy case management and/or care coordination services.   Consent to Services:  The patient was given information about Chronic Care Management services, agreed to services, and gave verbal consent prior to initiation of services.  Please see initial visit note for detailed documentation.   Patient Care Team: [name], [name] as PCP - General (Family Medicine) Wellington Hampshire, MD as PCP - Cardiology (Cardiology) Germaine Pomfret, [facility] (Pharmacist) Wellington Hampshire, MD as Consulting Physician (Cardiology)  Recent office visits: 12/07/21: Patient presented to Tally Joe, [name] for follow-up.   Recent consult visits: 02/24/22: Patient presented to [facility] (Cardiology).  02/23/22: Patient presented to Dr. Christian Mate (surgery) for gallstones.  Hospital visits: None noted in past 6 months.  Objective:  Lab Results  Component Value Date   CREATININE 1.22 09/25/2021   BUN 20 09/25/2021   GFRNONAA >60 02/27/2021   GFRAA 46 (L) 02/05/2020   NA 144 09/25/2021   K 4.3 09/25/2021   CALCIUM 8.9 09/25/2021   CO2 25 09/25/2021   GLUCOSE 105 (H) 09/25/2021    No results found for: "HGBA1C", "FRUCTOSAMINE", "GFR", "MICROALBUR"  Last diabetic Eye exam: No results found for: "HMDIABEYEEXA"  Last diabetic  Foot exam: No results found for: "HMDIABFOOTEX"   Lab Results  Component Value Date   CHOL 125 02/01/2021   HDL 49 02/01/2021   LDLCALC 62 02/01/2021   TRIG 68 02/01/2021   CHOLHDL 2.6 02/01/2021       Latest Ref Rng & Units 09/08/2021   10:33 AM 03/24/2021    9:02 AM 02/27/2021    2:10 AM  Hepatic Function  Total Protein 6.0 - 8.5 g/dL 6.5  6.0  6.2   Albumin 3.6 - 4.6 g/dL 3.6  2.9  2.4   AST 0 - 40 IU/L '10  14  30   ' ALT 0 - 44 IU/L '8  9  25   ' Alk Phosphatase 44 - 121 IU/L 101  111  74   Total Bilirubin 0.0 - 1.2 mg/dL 0.5  0.5  1.1   Bilirubin, Direct 0.0 - 0.2 mg/dL   0.3     Lab Results  Component Value Date/Time   TSH 7.750 (H) 09/08/2021 10:33 AM   TSH 5.170 (H) 01/02/2021 08:09 AM   FREET4 1.06 09/08/2021 10:33 AM       Latest Ref Rng & Units 09/08/2021   10:33 AM 03/24/2021    9:02 AM 02/27/2021    2:16 AM  CBC  WBC 3.4 - 10.8 x10E3/uL 5.4  5.9  6.5   Hemoglobin 13.0 - 17.7 g/dL 10.0  9.5  9.4   Hematocrit 37.5 - 51.0 % 32.9  29.8  29.5   Platelets 150 - 450 x10E3/uL 234  224  243     No results found for: "VD25OH"  Clinical ASCVD: Yes  The  ASCVD Risk score (Arnett DK, et al., 2019) failed to calculate for the following reasons:   The 2019 ASCVD risk score is only valid for ages 68 to 34   The patient has a prior MI or stroke diagnosis       12/07/2021    9:11 AM 04/18/2021   10:35 AM 02/05/2020    1:31 PM  Depression screen PHQ 2/9  Decreased Interest 1 0 1  Down, Depressed, Hopeless 0 0 0  PHQ - 2 Score 1 0 1  Altered sleeping   1  Tired, decreased energy   1  Change in appetite   0  Feeling bad or failure about yourself    0  Trouble concentrating   0  Moving slowly or fidgety/restless   0  Suicidal thoughts   0  PHQ-9 Score   3  Difficult doing work/chores   Not difficult at all    Social History   Tobacco Use  Smoking Status Never  Smokeless Tobacco Never   BP Readings from Last 3 Encounters:  02/24/22 (!) 145/84  02/23/22 137/86   12/15/21 130/76   Pulse Readings from Last 3 Encounters:  02/24/22 88  02/23/22 90  12/15/21 78   Wt Readings from Last 3 Encounters:  02/24/22 168 lb 14.4 oz (76.6 kg)  02/23/22 164 lb 9.6 oz (74.7 kg)  12/15/21 158 lb (71.7 kg)   BMI Readings from Last 3 Encounters:  02/24/22 24.23 kg/m  02/23/22 23.62 kg/m  12/15/21 22.67 kg/m    Assessment/Interventions: Review of patient past medical history, allergies, medications, health status, including review of consultants reports, laboratory and other test data, was performed as part of comprehensive evaluation and provision of chronic care management services.   SDOH:  (Social Determinants of Health) assessments and interventions performed: Yes    SDOH Screenings   Alcohol Screen: Low Risk  (12/07/2021)   Alcohol Screen    Last Alcohol Screening Score (AUDIT): 0  Depression (PHQ2-9): Low Risk  (12/07/2021)   Depression (PHQ2-9)    PHQ-2 Score: 1  Financial Resource Strain: Low Risk  (09/29/2021)   Overall Financial Resource Strain (CARDIA)    Difficulty of Paying Living Expenses: Not hard at all  Food Insecurity: No Food Insecurity (04/18/2021)   Hunger Vital Sign    Worried About Running Out of Food in the Last Year: Never true    [place] in the Last Year: Never true  Housing: Low Risk  (04/18/2021)   Housing    Last Housing Risk Score: 0  Physical Activity: Insufficiently Active (04/18/2021)   Exercise Vital Sign    Days of Exercise per Week: 1 day    Minutes of Exercise per Session: 10 min  Social Connections: Unknown (04/18/2021)   Social Connection and Isolation Panel [NHANES]    Frequency of Communication with Friends and Family: More than three times a week    Frequency of Social Gatherings with Friends and Family: Patient refused    Attends Religious Services: Not on file    Active Member of [place] or Organizations: No    Attends Archivist Meetings: Never    Marital Status: Married  Stress: No  Stress Concern Present (04/18/2021)   [employer]    Feeling of Stress : Only a little  Tobacco Use: Low Risk  (02/24/2022)   Patient History    Smoking Tobacco Use: Never    Smokeless Tobacco Use: Never  Passive Exposure: Not on file  Transportation Needs: No Transportation Needs (04/18/2021)   PRAPARE - Hydrologist (Medical): No    Lack of Transportation (Non-Medical): No    CCM Care Plan  Allergies  Allergen Reactions   Imodium [Loperamide] Nausea And Vomiting   Hydroxyzine Hcl     Other reaction(s): Other (See Comments) Sedation and myoclonic jerks of all four extremities. Noted 02/19/21.     Medications Reviewed Today     Reviewed by Antony Madura PA-C (Physician Assistant Certified) on 62/83/15 at [place] List Status: <None>   Medication Order Taking? Sig Documenting Provider Last Dose Status Informant  acetaminophen (TYLENOL) 500 MG tablet [id number] Yes Take 500 mg by mouth every 6 (six) hours as needed. [provider] Taking Active   amLODipine (NORVASC) 2.5 MG tablet [id number] Yes TAKE 1 TABLET(2.5 MG) BY MOUTH DAILY [name], [name] Taking Active   aspirin 81 MG tablet [id number] Yes Take 81 mg by mouth daily. [provider] Taking Active Self  atorvastatin (LIPITOR) 20 MG tablet [id number] Yes TAKE 1 TABLET(20 MG) BY MOUTH DAILY AT 6 PM Chrismon, Vickki Muff, PA-C Taking Active   calcium carbonate (TUMS - DOSED IN MG ELEMENTAL CALCIUM) 500 MG chewable tablet [id number] Yes Chew 1 tablet by mouth 2 (two) times daily as needed for indigestion or heartburn. [provider] Taking Active   carvedilol (COREG) 12.5 MG tablet [id number] Yes TAKE 1 TABLET(12.5 MG) BY MOUTH TWICE DAILY Chrismon, Driscilla Grammes Taking Active   CREON 36000-114000 units CPEP capsule [id number] Yes TAKE 1 CAPSULE BY MOUTH THREE TIMES DAILY WITH MEALS [name], [name] Taking  Active   furosemide (LASIX) 20 MG tablet [id number] Yes Take 1 tablet (20 mg total) by mouth daily. Kathlen Mody, Cadence H, PA-C Taking Active     Discontinued 10/15/11 1520 (Change in therapy)   nitroGLYCERIN (NITROSTAT) 0.4 MG SL tablet [id number] Yes Place 0.4 mg under the tongue every 5 (five) minutes as needed. [provider] Taking Active Self  omeprazole (PRILOSEC) 20 MG capsule [id number] Yes TAKE 2 CAPSULES(40 MG) BY MOUTH DAILY [name], [name] Taking Active     Discontinued 10/20/11 1527 (Change in therapy)   tamsulosin (FLOMAX) 0.4 MG CAPS capsule [id number] Yes TAKE 1 CAPSULE(0.4 MG) BY MOUTH DAILY [name], [name] Taking Active   Thiamine HCl (B-1) 100 MG TABS [id number] Yes TAKE 1 TABLET BY MOUTH EVERY DAY Chrismon, Vickki Muff, PA-C Taking Active             Patient Active Problem List   Diagnosis Date Noted   Primary hypertension 12/07/2021   Aortic valve regurgitation 12/07/2021   PND (paroxysmal nocturnal dyspnea) 09/08/2021   Orthopnea 09/08/2021   Venous insufficiency of right leg 09/08/2021   Screening for prostate cancer 09/08/2021   Elevated TSH 09/08/2021   Iron deficiency anemia secondary to inadequate dietary iron intake 09/08/2021   Insomnia 06/05/2021   Exercise intolerance 06/05/2021   Constipation 03/31/2021   Hypotension due to hypovolemia 03/24/2021   Dehydration 03/24/2021   Stage 2 chronic kidney disease 02/20/2021   Lactic acidosis 02/20/2021   Acute respiratory failure with hypoxia ([place]) 02/20/2021   Gallstone pancreatitis    Shortness of breath    Gallstones    Enteritis    Acute pancreatitis 02/01/2021   Narrowing of intervertebral disc space 06/18/2016   Elevated prostate specific antigen (PSA) 06/18/2016   Benign prostatic hyperplasia without lower urinary tract  symptoms 06/18/2016   HLD (hyperlipidemia)    Mixed hyperlipidemia    CAD (coronary artery disease)    Coronary artery disease involving native coronary artery of native  heart with angina pectoris with documented spasm (HCC)    NSTEMI (non-ST elevated myocardial infarction) ([place]) 03/19/2016   HTN (hypertension)    High blood cholesterol level    Acid reflux    Hyperlipidemia 12/27/2008   Essential hypertension 12/27/2008   Coronary atherosclerosis 12/27/2008   Aortic valve disorder 12/27/2008   Esophagitis, reflux 06/06/2006   Hypertensive heart disease 08/10/2003    Immunization History  Administered Date(s) Administered   Fluad Quad(high Dose 65+) 06/05/2021   Influenza, High Dose Seasonal PF 06/21/2019   PFIZER Comirnaty(Gray Top)Covid-19 Tri-Sucrose Vaccine 09/10/2019, 10/01/2019, 02/25/2021   PFIZER(Purple Top)SARS-COV-2 Vaccination 09/10/2019, 10/01/2019   Pneumococcal Conjugate-13 02/05/2020    Conditions to be addressed/monitored:  Hypertension, Hyperlipidemia, Coronary Artery Disease, GERD, BPH, and History of NSTEMI  Care Plan : General Pharmacy (Adult)  Updates made by Germaine Pomfret, [place] since 03/30/2022 12:00 AM     Problem: Hypertension, Hyperlipidemia, Coronary Artery Disease, GERD, BPH, and History of NSTEMI   Priority: High     Long-Range Goal: Patient-Specific Goal   Start Date: 06/18/2021  Expected End Date: 06/18/2022  This Visit's Progress: On track  Recent Progress: On track  Priority: High  Note:   Current Barriers:  Unable to independently afford treatment regimen  Pharmacist Clinical Goal(s):  Patient will maintain control of blood pressure as evidenced by BP less than 140/90  through collaboration with PharmD and provider.   Interventions: 1:1 collaboration with [name], [name] regarding development and update of comprehensive plan of care as evidenced by provider attestation and co-signature Inter-disciplinary care team collaboration (see longitudinal plan of care) Comprehensive medication review performed; medication list updated in electronic medical record  Hypertension (BP goal  <140/90) -Controlled -Current treatment: Amlodipine 2.5 mg daily: Appropriate, Effective, Safe, Accessible  Carvedilol 12.5 mg twice daily: Appropriate, Effective, Safe, Accessible  Furosemide 20 mg daily: Appropriate, Effective, Safe, Accessible  -Medications previously tried: NA  -Disability in right foot limits balance.  -Current home readings:  -Denies hypotensive/hypertensive symptoms -Recommended to continue current medication  Hyperlipidemia: (LDL goal < 70) -Controlled -History of NSTEMI x3 (2000) -Current treatment: Atorvastatin 20 mg daily  -Medications previously tried: NA  -Recommended to continue current medication  Reflux (Goal: Minimize symptoms) -Controlled -Current treatment  Omeprazole 2 capsules daily before breakfast -Medications previously tried: NA  -Recommended to continue current medication  BPH (Goal: Minimize urinary symptoms) -Controlled -Current treatment  Tamsulosin 0.4 mg daily (HS)  -Medications previously tried: NA  -Recommended to continue current medication  Insomnia (Goal: Improve sleep quality) -Controlled -Current treatment  None -Medications previously tried: Melatonin   Pancreatitis (Goal: Minimize symptoms) -Controlled -Current treatment  Creon 360,000 units three times daily with meals: Appropriate, Effective, Safe, Accessible -Medications previously tried: NA  -Patient assistance approved, patient has not any any stomach pain with meals since starting this medication.  -Recommended to continue current medication  Patient Goals/Self-Care Activities Patient will:  - check blood pressure 2-3 times weekly, document, and provide at future appointments  Follow Up Plan: No further follow up required: Patient will reach out for further clinical concerns.      Medication Assistance:  Creon obtained through [facility] medication assistance program.  Enrollment ends Dec 2023  Compliance/Adherence/Medication fill history: Care  Gaps: Tetanus Vaccine Shingrix Vaccine Pneumonia Vaccine (Last Completed 02/05/2020) Influenza Vaccine Covid-19 Vaccine (  4- Booster for [employer])  Star-Rating Drugs: Atorvastatin 20 mg  last filled 07/18/2021 [facility] day [facility].  Patient's preferred pharmacy is:  [facility], [place] - [place] [address] [place] [address] Phone: [phone] Fax: [phone]   Uses pill box? No - too much work to set up Pt endorses 100% compliance  We discussed: Current pharmacy is preferred with insurance plan and patient is satisfied with pharmacy services Patient decided to: Continue current medication management strategy  Care Plan and Follow Up Patient Decision:  Patient agrees to Care Plan and Follow-up.  Plan: No further follow up required: Patient will reach out for further clinical concerns.  Junius Argyle, PharmD, Para [place], CPP  Clinical Pharmacist Practitioner  [facility] [phone]

## 2022-03-30 NOTE — Patient Instructions (Signed)
Visit Information It was great speaking with you today!  Please let me know if you have any questions about our visit.   Goals Addressed             This Visit's Progress    Track and Manage My Blood Pressure-Hypertension   On track    Timeframe:  Long-Range Goal Priority:  High Start Date: 06/18/2021                            Expected End Date: 06/18/2022                      Follow Up within 90 days   - check blood pressure weekly    Why is this important?   You won't feel high blood pressure, but it can still hurt your blood vessels.  High blood pressure can cause heart or kidney problems. It can also cause a stroke.  Making lifestyle changes like losing a little weight or eating less salt will help.  Checking your blood pressure at home and at different times of the day can help to control blood pressure.  If the doctor prescribes medicine remember to take it the way the doctor ordered.  Call the office if you cannot afford the medicine or if there are questions about it.     Notes:         Patient Care Plan: General Pharmacy (Adult)     Problem Identified: Hypertension, Hyperlipidemia, Coronary Artery Disease, GERD, BPH, and History of NSTEMI   Priority: High     Long-Range Goal: Patient-Specific Goal   Start Date: 06/18/2021  Expected End Date: 06/18/2022  This Visit's Progress: On track  Recent Progress: On track  Priority: High  Note:   Current Barriers:  Unable to independently afford treatment regimen  Pharmacist Clinical Goal(s):  Patient will maintain control of blood pressure as evidenced by BP less than 140/90  through collaboration with PharmD and provider.   Interventions: 1:1 collaboration with [name], [name] regarding development and update of comprehensive plan of care as evidenced by provider attestation and co-signature Inter-disciplinary care team collaboration (see longitudinal plan of care) Comprehensive medication review  performed; medication list updated in electronic medical record  Hypertension (BP goal <140/90) -Controlled -Current treatment: Amlodipine 2.5 mg daily: Appropriate, Effective, Safe, Accessible  Carvedilol 12.5 mg twice daily: Appropriate, Effective, Safe, Accessible  Furosemide 20 mg daily: Appropriate, Effective, Safe, Accessible  -Medications previously tried: NA  -Disability in right foot limits balance.  -Current home readings:  -Denies hypotensive/hypertensive symptoms -Recommended to continue current medication  Hyperlipidemia: (LDL goal < 70) -Controlled -History of NSTEMI x3 (2000) -Current treatment: Atorvastatin 20 mg daily  -Medications previously tried: NA  -Recommended to continue current medication  Reflux (Goal: Minimize symptoms) -Controlled -Current treatment  Omeprazole 2 capsules daily before breakfast -Medications previously tried: NA  -Recommended to continue current medication  BPH (Goal: Minimize urinary symptoms) -Controlled -Current treatment  Tamsulosin 0.4 mg daily (HS)  -Medications previously tried: NA  -Recommended to continue current medication  Insomnia (Goal: Improve sleep quality) -Controlled -Current treatment  None -Medications previously tried: Melatonin   Pancreatitis (Goal: Minimize symptoms) -Controlled -Current treatment  Creon 360,000 units three times daily with meals: Appropriate, Effective, Safe, Accessible -Medications previously tried: NA  -Patient assistance approved, patient has not any any stomach pain with meals since starting this medication.  -Recommended to continue current medication  Patient Goals/Self-Care Activities Patient will:  - check blood pressure 2-3 times weekly, document, and provide at future appointments  Follow Up Plan: No further follow up required: Patient will reach out for further clinical concerns.    Patient agreed to services and verbal consent obtained.   Patient verbalizes  understanding of instructions and care plan provided today and agrees to view in [place]. Active MyChart status and patient understanding of how to access instructions and care plan via MyChart confirmed with patient.     Junius Argyle, PharmD, [place], CPP  Clinical Pharmacist Practitioner  [facility] [phone]

## 2022-04-04 ENCOUNTER — Other Ambulatory Visit: Payer: Self-pay | Admitting: Family Medicine

## 2022-04-04 DIAGNOSIS — K219 Gastro-esophageal reflux disease without esophagitis: Secondary | ICD-10-CM

## 2022-04-08 DIAGNOSIS — E785 Hyperlipidemia, unspecified: Secondary | ICD-10-CM

## 2022-04-08 DIAGNOSIS — I10 Essential (primary) hypertension: Secondary | ICD-10-CM | POA: Diagnosis not present

## 2022-04-08 DIAGNOSIS — N400 Enlarged prostate without lower urinary tract symptoms: Secondary | ICD-10-CM

## 2022-04-13 ENCOUNTER — Telehealth: Payer: Self-pay | Admitting: Family Medicine

## 2022-04-13 DIAGNOSIS — E7800 Pure hypercholesterolemia, unspecified: Secondary | ICD-10-CM

## 2022-04-13 DIAGNOSIS — I10 Essential (primary) hypertension: Secondary | ICD-10-CM

## 2022-04-13 MED ORDER — ATORVASTATIN CALCIUM 20 MG PO TABS: ORAL_TABLET | ORAL | 1 refills | Status: DC

## 2022-04-13 MED ORDER — CARVEDILOL 12.5 MG PO TABS: ORAL_TABLET | ORAL | 1 refills | Status: DC

## 2022-04-13 NOTE — Telephone Encounter (Signed)
[facility] faxed refill request for the following medications:  atorvastatin (LIPITOR) 20 MG tablet  carvedilol (COREG) 12.5 MG tablet   Please advise

## 2022-04-19 ENCOUNTER — Ambulatory Visit (INDEPENDENT_AMBULATORY_CARE_PROVIDER_SITE_OTHER): Payer: Medicare Other

## 2022-04-19 VITALS — BP 130/78 | Ht 70.0 in | Wt 169.0 lb

## 2022-04-19 DIAGNOSIS — Z0000 Encounter for general adult medical examination without abnormal findings: Secondary | ICD-10-CM | POA: Diagnosis not present

## 2022-04-19 NOTE — Progress Notes (Signed)
Subjective:   James Mora is a 86 y.o. male who presents for Medicare Annual/Subsequent preventive examination.  Review of Systems           Objective:    Today's Vitals   04/19/22 0956  BP: 130/78  Weight: 169 lb (76.7 kg)  Height: '5\' 10"'$  (1.778 m)   Body mass index is 24.25 kg/m.     04/18/2021   10:49 AM 02/27/2021    2:14 AM 02/01/2021    4:45 PM 03/19/2016   10:03 PM 03/19/2016    5:02 PM  Advanced Directives  Does Patient Have a Medical Advance Directive? No No No No No  Would patient like information on creating a medical advance directive? No - Patient declined No - Patient declined No - Patient declined No - patient declined information No - patient declined information    Current Medications (verified) Outpatient Encounter Medications as of 04/19/2022  Medication Sig   acetaminophen (TYLENOL) 500 MG tablet Take 500 mg by mouth every 6 (six) hours as needed.   amLODipine (NORVASC) 2.5 MG tablet TAKE 1 TABLET(2.5 MG) BY MOUTH DAILY   amoxicillin (AMOXIL) 500 MG tablet Take 500 mg by mouth 3 (three) times daily.   aspirin 81 MG tablet Take 81 mg by mouth daily.   atorvastatin (LIPITOR) 20 MG tablet TAKE 1 TABLET(20 MG) BY MOUTH DAILY AT 6 PM   calcium carbonate (TUMS - DOSED IN MG ELEMENTAL CALCIUM) 500 MG chewable tablet Chew 1 tablet by mouth 2 (two) times daily as needed for indigestion or heartburn.   carvedilol (COREG) 12.5 MG tablet TAKE 1 TABLET(12.5 MG) BY MOUTH TWICE DAILY   CREON 36000-114000 units CPEP capsule TAKE 1 CAPSULE BY MOUTH THREE TIMES DAILY WITH MEALS   furosemide (LASIX) 20 MG tablet Take 1 tablet (20 mg total) by mouth daily.   nitroGLYCERIN (NITROSTAT) 0.4 MG SL tablet Place 0.4 mg under the tongue every 5 (five) minutes as needed.   omeprazole (PRILOSEC) 20 MG capsule TAKE 2 CAPSULES(40 MG) BY MOUTH DAILY   tamsulosin (FLOMAX) 0.4 MG CAPS capsule TAKE 1 CAPSULE(0.4 MG) BY MOUTH DAILY   Thiamine HCl (B-1) 100 MG TABS TAKE 1 TABLET BY MOUTH  EVERY DAY   [DISCONTINUED] lovastatin (ALTOPREV) 40 MG 24 hr tablet Take 80 mg by mouth at bedtime.     [DISCONTINUED] simvastatin (ZOCOR) 40 MG tablet Take 40 mg by mouth at bedtime.     No facility-administered encounter medications on file as of 04/19/2022.    Allergies (verified) Imodium [loperamide] and Hydroxyzine hcl   History: Past Medical History:  Diagnosis Date   Acid reflux    Aortic insufficiency    a. 03/2016 Echo: EF 55-60%, no rwma, Gr1 DD, mild to mod AI, mild TR.   CAD (coronary artery disease)    a. 2000 Cath: Severe distal LAD disease with left to left collaterals;  b. 2008 MV: non-ischemic; c. 03/2016 Cath: LM nl, LAD 30p, D1/D2 mod, nl, LCX nl, OM1/2 nl w/ L-->L collats to apical LAD, RCA dominant, nl. EF 55-65%.   Hypertensive heart disease 2005   unspecified   Mixed hyperlipidemia    mixed   Past Surgical History:  Procedure Laterality Date   CARDIAC CATHETERIZATION  2000   left heart   CARDIAC CATHETERIZATION N/A 03/22/2016   Procedure: Left Heart Cath And Coronary Angiography;  Surgeon: Minna Merritts, MD;  Location: [facility];  Service: Cardiovascular;  Laterality: N/A;   COLONOSCOPY  2008  Dr. Allen Norris   Family History  Family history unknown: Yes   Social History   Socioeconomic History   Marital status: Married    Spouse name: Not on file   Number of children: 1   Years of education: Not on file   Highest education level: Not on file  Occupational History   Not on file  Tobacco Use   Smoking status: Never   Smokeless tobacco: Never  Vaping Use   Vaping Use: Never used  Substance and Sexual Activity   Alcohol use: No   Drug use: No   Sexual activity: Not on file  Other Topics Concern   Not on file  Social History Narrative   Not on file   Social Determinants of Health   Financial Resource Strain: Low Risk  (09/29/2021)   Overall Financial Resource Strain (CARDIA)    Difficulty of Paying Living Expenses: Not hard at all   Food Insecurity: No Food Insecurity (04/18/2021)   Hunger Vital Sign    Worried About Running Out of Food in the Last Year: Never true    [place] in the Last Year: Never true  Transportation Needs: No Transportation Needs (04/18/2021)   PRAPARE - Hydrologist (Medical): No    Lack of Transportation (Non-Medical): No  Physical Activity: Insufficiently Active (04/18/2021)   Exercise Vital Sign    Days of Exercise per Week: 1 day    Minutes of Exercise per Session: 10 min  Stress: No Stress Concern Present (04/18/2021)   [place]    Feeling of Stress : Only a little  Social Connections: Unknown (04/18/2021)   Social Connection and Isolation Panel [NHANES]    Frequency of Communication with Friends and Family: More than three times a week    Frequency of Social Gatherings with Friends and Family: Patient refused    Attends Religious Services: Not on Advertising copywriter or Organizations: No    Attends Archivist Meetings: Never    Marital Status: Married    Tobacco Counseling Counseling given: Not Answered   Clinical Intake:  Pre-visit preparation completed: Yes  Pain : No/denies pain     Nutritional Risks: None Diabetes: No  How often do you need to have someone help you when you read instructions, pamphlets, or other written materials from your doctor or pharmacy?: 1 - Never  Diabetic?no  Interpreter Needed?: No  Information entered by :: Kirke Shaggy, LPN   Activities of Daily Living    12/07/2021    9:11 AM  In your present state of health, do you have any difficulty performing the following activities:  Hearing? 1  Vision? 0  Difficulty concentrating or making decisions? 1  Walking or climbing stairs? 0  Dressing or bathing? 0  Doing errands, shopping? 1    Patient Care Team: [name], [name] as PCP - General (Family  Medicine) Wellington Hampshire, MD as PCP - Cardiology (Cardiology) Germaine Pomfret, [facility] (Pharmacist) Wellington Hampshire, MD as Consulting Physician (Cardiology)  Indicate any recent Medical Services you may have received from other than [facility] providers in the past year (date may be approximate).     Assessment:   This is a routine wellness examination for James Mora.  Hearing/Vision screen No results found.  Dietary issues and exercise activities discussed:     Goals Addressed   None    Depression Screen  12/07/2021    9:11 AM 04/18/2021   10:35 AM 02/05/2020    1:31 PM 06/20/2017    8:43 AM 06/18/2016    9:18 AM  PHQ 2/9 Scores  PHQ - 2 Score 1 0 1 0 0  PHQ- 9 Score   3 0     Fall Risk    02/23/2022    9:18 AM 01/19/2022    9:31 AM 12/07/2021    9:11 AM 09/17/2021   10:32 AM 04/18/2021   10:51 AM  Fall Risk   Falls in the past year? 0 0 1 0 0  Number falls in past yr:   0  0  Injury with Fall?   0  0    FALL RISK PREVENTION PERTAINING TO THE HOME:  Any stairs in or around the home? Yes  If so, are there any without handrails? No  Home free of loose throw rugs in walkways, pet beds, electrical cords, etc? Yes  Adequate lighting in your home to reduce risk of falls? Yes   ASSISTIVE DEVICES UTILIZED TO PREVENT FALLS:  Life alert? No  Use of a cane, walker or w/c? No  Grab bars in the bathroom? Yes  Shower chair or bench in shower? Yes  Elevated toilet seat or a handicapped toilet? Yes   TIMED UP AND GO:  Was the test performed? Yes .  Length of time to ambulate 10 feet: 5 sec.   Gait slow and steady with assistive device  Cognitive Function:        04/18/2021   10:58 AM 02/05/2020    1:31 PM  6CIT Screen  What Year? 4 points 0 points  What month? 3 points 0 points  What time? 0 points 0 points  Count back from 20 0 points 0 points  Months in reverse 2 points 2 points  Repeat phrase 8 points 2 points  Total Score 17 points 4 points     Immunizations Immunization History  Administered Date(s) Administered   Fluad Quad(high Dose 65+) 06/05/2021   Influenza, High Dose Seasonal PF 06/21/2019   PFIZER Comirnaty(Gray Top)Covid-19 Tri-Sucrose Vaccine 09/10/2019, 10/01/2019, 02/25/2021   PFIZER(Purple Top)SARS-COV-2 Vaccination 09/10/2019, 10/01/2019   Pneumococcal Conjugate-13 02/05/2020    TDAP status: Due, Education has been provided regarding the importance of this vaccine. Advised may receive this vaccine at local pharmacy or Health Dept. Aware to provide a copy of the vaccination record if obtained from local pharmacy or Health Dept. Verbalized acceptance and understanding.  Flu Vaccine status: Up to date  Pneumococcal vaccine status: Due, Education has been provided regarding the importance of this vaccine. Advised may receive this vaccine at local pharmacy or Health Dept. Aware to provide a copy of the vaccination record if obtained from local pharmacy or Health Dept. Verbalized acceptance and understanding.  Covid-19 vaccine status: Completed vaccines  Qualifies for Shingles Vaccine? Yes   Zostavax completed No   Shingrix Completed?: No.    Education has been provided regarding the importance of this vaccine. Patient has been advised to call insurance company to determine out of pocket expense if they have not yet received this vaccine. Advised may also receive vaccine at local pharmacy or Health Dept. Verbalized acceptance and understanding.  Screening Tests Health Maintenance  Topic Date Due   TETANUS/TDAP  Never done   Zoster Vaccines- Shingrix (1 of 2) Never done   Pneumonia Vaccine 38+ Years old (2 - PPSV23 or PCV20) 02/04/2021   COVID-19 Vaccine (6 - Pfizer series) 04/22/2021  INFLUENZA VACCINE  03/09/2022   HPV VACCINES  Aged Out    Health Maintenance  Health Maintenance Due  Topic Date Due   TETANUS/TDAP  Never done   Zoster Vaccines- Shingrix (1 of 2) Never done   Pneumonia Vaccine 65+ Years  old (2 - PPSV23 or PCV20) 02/04/2021   COVID-19 Vaccine (6 - Pfizer series) 04/22/2021   INFLUENZA VACCINE  03/09/2022    Colorectal cancer screening: No longer required.   Lung Cancer Screening: (Low Dose CT Chest recommended if Age 36-80 years, 30 pack-year currently smoking OR have quit w/in 15years.) does not qualify.   Additional Screening:  Hepatitis C Screening: does not qualify; Completed no  Vision Screening: Recommended annual ophthalmology exams for early detection of glaucoma and other disorders of the eye. Is the patient up to date with their annual eye exam?  Yes  Who is the provider or what is the name of the office in which the patient attends annual eye exams? [place] If pt is not established with a provider, would they like to be referred to a provider to establish care? No .   Dental Screening: Recommended annual dental exams for proper oral hygiene  Community Resource Referral / Chronic Care Management: CRR required this visit?  No   CCM required this visit?  No      Plan:     I have personally reviewed and noted the following in the patient's chart:   Medical and social history Use of alcohol, tobacco or illicit drugs  Current medications and supplements including opioid prescriptions. Patient is not currently taking opioid prescriptions. Functional ability and status Nutritional status Physical activity Advanced directives List of other physicians Hospitalizations, surgeries, and ER visits in previous 12 months Vitals Screenings to include cognitive, depression, and falls Referrals and appointments  In addition, I have reviewed and discussed with patient certain preventive protocols, quality metrics, and best practice recommendations. A written personalized care plan for preventive services as well as general preventive health recommendations were provided to patient.     Dionisio David, LPN   5/52/0802   Nurse Notes: none

## 2022-04-19 NOTE — Patient Instructions (Signed)
James Mora , Thank you for taking time to come for your Medicare Wellness Visit. I appreciate your ongoing commitment to your health goals. Please review the following plan we discussed and let me know if I can assist you in the future.   Screening recommendations/referrals: Colonoscopy: aged out Recommended yearly ophthalmology/optometry visit for glaucoma screening and checkup Recommended yearly dental visit for hygiene and checkup  Vaccinations: Influenza vaccine: 07/01/21 Pneumococcal vaccine: 02/05/20 Tdap vaccine: due if have injury Shingles vaccine: n/d   Covid-19: 09/10/19, 10/01/19, 02/25/21  Advanced directives: no  Conditions/risks identified: none  Next appointment: Follow up in one year for your annual wellness visit. 04/21/23 @ 9:30 am in person  Preventive Care 65 Years and Older, Male Preventive care refers to lifestyle choices and visits with your health care provider that can promote health and wellness. What does preventive care include? A yearly physical exam. This is also called an annual well check. Dental exams once or twice a year. Routine eye exams. Ask your health care provider how often you should have your eyes checked. Personal lifestyle choices, including: Daily care of your teeth and gums. Regular physical activity. Eating a healthy diet. Avoiding tobacco and drug use. Limiting alcohol use. Practicing safe sex. Taking low doses of aspirin every day. Taking vitamin and mineral supplements as recommended by your health care provider. What happens during an annual well check? The services and screenings done by your health care provider during your annual well check will depend on your age, overall health, lifestyle risk factors, and family history of disease. Counseling  Your health care provider may ask you questions about your: Alcohol use. Tobacco use. Drug use. Emotional well-being. Home and relationship well-being. Sexual activity. Eating  habits. History of falls. Memory and ability to understand (cognition). Work and work Statistician. Screening  You may have the following tests or measurements: Height, weight, and BMI. Blood pressure. Lipid and cholesterol levels. These may be checked every 5 years, or more frequently if you are over 45 years old. Skin check. Lung cancer screening. You may have this screening every year starting at age 59 if you have a 30-pack-year history of smoking and currently smoke or have quit within the past 15 years. Fecal occult blood test (FOBT) of the stool. You may have this test every year starting at age 55. Flexible sigmoidoscopy or colonoscopy. You may have a sigmoidoscopy every 5 years or a colonoscopy every 10 years starting at age 98. Prostate cancer screening. Recommendations will vary depending on your family history and other risks. Hepatitis C blood test. Hepatitis B blood test. Sexually transmitted disease (STD) testing. Diabetes screening. This is done by checking your blood sugar (glucose) after you have not eaten for a while (fasting). You may have this done every 1-3 years. Abdominal aortic aneurysm (AAA) screening. You may need this if you are a current or former smoker. Osteoporosis. You may be screened starting at age 47 if you are at high risk. Talk with your health care provider about your test results, treatment options, and if necessary, the need for more tests. Vaccines  Your health care provider may recommend certain vaccines, such as: Influenza vaccine. This is recommended every year. Tetanus, diphtheria, and acellular pertussis (Tdap, Td) vaccine. You may need a Td booster every 10 years. Zoster vaccine. You may need this after age 25. Pneumococcal 13-valent conjugate (PCV13) vaccine. One dose is recommended after age 20. Pneumococcal polysaccharide (PPSV23) vaccine. One dose is recommended after age 59. Talk  to your health care provider about which screenings and  vaccines you need and how often you need them. This information is not intended to replace advice given to you by your health care provider. Make sure you discuss any questions you have with your health care provider. Document Released: 08/22/2015 Document Revised: 04/14/2016 Document Reviewed: 05/27/2015 Elsevier Interactive Patient Education  2017 [place] Prevention in the Home Falls can cause injuries. They can happen to people of all ages. There are many things you can do to make your home safe and to help prevent falls. What can I do on the outside of my home? Regularly fix the edges of walkways and driveways and fix any cracks. Remove anything that might make you trip as you walk through a door, such as a raised step or threshold. Trim any bushes or trees on the path to your home. Use bright outdoor lighting. Clear any walking paths of anything that might make someone trip, such as rocks or tools. Regularly check to see if handrails are loose or broken. Make sure that both sides of any steps have handrails. Any raised decks and porches should have guardrails on the edges. Have any leaves, snow, or ice cleared regularly. Use sand or salt on walking paths during winter. Clean up any spills in your garage right away. This includes oil or grease spills. What can I do in the bathroom? Use night lights. Install grab bars by the toilet and in the tub and shower. Do not use towel bars as grab bars. Use non-skid mats or decals in the tub or shower. If you need to sit down in the shower, use a plastic, non-slip stool. Keep the floor dry. Clean up any water that spills on the floor as soon as it happens. Remove soap buildup in the tub or shower regularly. Attach bath mats securely with double-sided non-slip rug tape. Do not have throw rugs and other things on the floor that can make you trip. What can I do in the bedroom? Use night lights. Make sure that you have a light by your  bed that is easy to reach. Do not use any sheets or blankets that are too big for your bed. They should not hang down onto the floor. Have a firm chair that has side arms. You can use this for support while you get dressed. Do not have throw rugs and other things on the floor that can make you trip. What can I do in the kitchen? Clean up any spills right away. Avoid walking on wet floors. Keep items that you use a lot in easy-to-reach places. If you need to reach something above you, use a strong step stool that has a grab bar. Keep electrical cords out of the way. Do not use floor polish or wax that makes floors slippery. If you must use wax, use non-skid floor wax. Do not have throw rugs and other things on the floor that can make you trip. What can I do with my stairs? Do not leave any items on the stairs. Make sure that there are handrails on both sides of the stairs and use them. Fix handrails that are broken or loose. Make sure that handrails are as long as the stairways. Check any carpeting to make sure that it is firmly attached to the stairs. Fix any carpet that is loose or worn. Avoid having throw rugs at the top or bottom of the stairs. If you do have throw rugs,  attach them to the floor with carpet tape. Make sure that you have a light switch at the top of the stairs and the bottom of the stairs. If you do not have them, ask someone to add them for you. What else can I do to help prevent falls? Wear shoes that: Do not have high heels. Have rubber bottoms. Are comfortable and fit you well. Are closed at the toe. Do not wear sandals. If you use a stepladder: Make sure that it is fully opened. Do not climb a closed stepladder. Make sure that both sides of the stepladder are locked into place. Ask someone to hold it for you, if possible. Clearly mark and make sure that you can see: Any grab bars or handrails. First and last steps. Where the edge of each step is. Use tools that  help you move around (mobility aids) if they are needed. These include: Canes. Walkers. Scooters. Crutches. Turn on the lights when you go into a dark area. Replace any light bulbs as soon as they burn out. Set up your furniture so you have a clear path. Avoid moving your furniture around. If any of your floors are uneven, fix them. If there are any pets around you, be aware of where they are. Review your medicines with your doctor. Some medicines can make you feel dizzy. This can increase your chance of falling. Ask your doctor what other things that you can do to help prevent falls. This information is not intended to replace advice given to you by your health care provider. Make sure you discuss any questions you have with your health care provider. Document Released: 05/22/2009 Document Revised: 01/01/2016 Document Reviewed: 08/30/2014 Elsevier Interactive Patient Education  2017 [employer].

## 2022-04-27 ENCOUNTER — Encounter: Payer: Self-pay | Admitting: Surgery

## 2022-04-27 ENCOUNTER — Ambulatory Visit: Payer: Medicare Other | Admitting: Surgery

## 2022-04-27 ENCOUNTER — Other Ambulatory Visit: Payer: Self-pay

## 2022-04-27 VITALS — BP 147/91 | HR 88 | Temp 98.2°F | Ht 69.0 in | Wt 172.0 lb

## 2022-04-27 DIAGNOSIS — R319 Hematuria, unspecified: Secondary | ICD-10-CM

## 2022-04-27 DIAGNOSIS — K8020 Calculus of gallbladder without cholecystitis without obstruction: Secondary | ICD-10-CM

## 2022-04-27 NOTE — Progress Notes (Signed)
Mr. Gunby is doing very well.  Eating what he wants to, having no abdominal pain whatsoever.  Currently not taking any further pancreatic enzymes.  His biggest concern today is seeing a pinpoint of blood on his underwear it seems in the morning.  He thinks it occurs with his last void prior to bedtime.  He has never seen blood in his meatus, his urine looks normal.  Just curious as to if it should be a concern for him.  On exam his abdomen is soft and nontender.  The spot of blood may just be coming from his distal urethra.  And may be of no other urological concern, however I would like this gentleman's peace of mind with a urological evaluation.  Referral to urology.  I will be glad to see this gentleman back should any change in his symptoms occur.  He is not interested in pursuing cholecystectomy at this time, excepting the risks of possible eventual cholecystitis versus recurrent pancreatitis.

## 2022-04-27 NOTE — Patient Instructions (Signed)
Referral to Urology. Someone from their office will call to schedule an appointment. If you have not heard from their office within 5-7 days please call their office.

## 2022-05-05 ENCOUNTER — Ambulatory Visit: Payer: Medicare Other | Admitting: Urology

## 2022-05-17 ENCOUNTER — Encounter: Payer: Self-pay | Admitting: Urology

## 2022-05-17 ENCOUNTER — Ambulatory Visit: Payer: Medicare Other | Admitting: Urology

## 2022-05-17 VITALS — BP 119/77 | HR 90 | Ht 70.0 in | Wt 164.0 lb

## 2022-05-17 DIAGNOSIS — R972 Elevated prostate specific antigen [PSA]: Secondary | ICD-10-CM

## 2022-05-17 DIAGNOSIS — N368 Other specified disorders of urethra: Secondary | ICD-10-CM | POA: Diagnosis not present

## 2022-05-17 NOTE — Progress Notes (Signed)
05/17/2022 1:34 PM   James Mora 11-Jun-1936 [id number]  Referring provider: [name], MD [address] [place],  James [place] [address]  Chief Complaint  Patient presents with   Hematuria    HPI: 86 y.o. male referred for urethral spotting.  Previously referred and seen 01/14/2021 for the same problem Urinalysis clear without microhematuria Had a recent general surgery visit 04/27/2022 he had noted a pinpoint spot of blood in his underwear and has seen no further blood.  This was the first time since his last visit in 2022. I have a cystoscopy in 2022 and he was scheduled however elected not to pursue after the problem resolved.   PMH: Past Medical History:  Diagnosis Date   Acid reflux    Aortic insufficiency    a. 03/2016 Echo: EF 55-60%, no rwma, Gr1 DD, mild to mod AI, mild TR.   CAD (coronary artery disease)    a. 2000 Cath: Severe distal LAD disease with left to left collaterals;  b. 2008 MV: non-ischemic; c. 03/2016 Cath: LM nl, LAD 30p, D1/D2 mod, nl, LCX nl, OM1/2 nl w/ L-->L collats to apical LAD, RCA dominant, nl. EF 55-65%.   Hypertensive heart disease 2005   unspecified   Mixed hyperlipidemia    mixed    Surgical History: Past Surgical History:  Procedure Laterality Date   CARDIAC CATHETERIZATION  2000   left heart   CARDIAC CATHETERIZATION N/A 03/22/2016   Procedure: Left Heart Cath And Coronary Angiography;  Surgeon: Minna Merritts, MD;  Location: [facility];  Service: Cardiovascular;  Laterality: N/A;   COLONOSCOPY  2008   Dr. Allen Norris    Home Medications:  Allergies as of 05/17/2022       Reactions   Imodium [loperamide] Nausea And Vomiting   Hydroxyzine Hcl    Other reaction(s): Other (See Comments) Sedation and myoclonic jerks of all four extremities. Noted 02/19/21.         Medication List        Accurate as of May 17, 2022  1:34 PM. If you have any questions, ask your nurse or doctor.           acetaminophen 500 MG tablet Commonly known as: TYLENOL Take 500 mg by mouth every 6 (six) hours as needed.   amLODipine 2.5 MG tablet Commonly known as: NORVASC TAKE 1 TABLET(2.5 MG) BY MOUTH DAILY   amoxicillin 500 MG tablet Commonly known as: AMOXIL Take 500 mg by mouth 3 (three) times daily.   aspirin 81 MG tablet Take 81 mg by mouth daily.   atorvastatin 20 MG tablet Commonly known as: LIPITOR TAKE 1 TABLET(20 MG) BY MOUTH DAILY AT 6 PM   B-1 100 MG Tabs TAKE 1 TABLET BY MOUTH EVERY DAY   calcium carbonate 500 MG chewable tablet Commonly known as: TUMS - dosed in mg elemental calcium Chew 1 tablet by mouth 2 (two) times daily as needed for indigestion or heartburn.   carvedilol 12.5 MG tablet Commonly known as: COREG TAKE 1 TABLET(12.5 MG) BY MOUTH TWICE DAILY   Creon 36000 UNITS Cpep capsule Generic drug: lipase/protease/amylase TAKE 1 CAPSULE BY MOUTH THREE TIMES DAILY WITH MEALS   furosemide 20 MG tablet Commonly known as: Lasix Take 1 tablet (20 mg total) by mouth daily.   nitroGLYCERIN 0.4 MG SL tablet Commonly known as: NITROSTAT Place 0.4 mg under the tongue every 5 (five) minutes as needed.   omeprazole 20 MG capsule Commonly known as: PRILOSEC TAKE  2 CAPSULES(40 MG) BY MOUTH DAILY   tamsulosin 0.4 MG Caps capsule Commonly known as: FLOMAX TAKE 1 CAPSULE(0.4 MG) BY MOUTH DAILY        Allergies:  Allergies  Allergen Reactions   Imodium [Loperamide] Nausea And Vomiting   Hydroxyzine Hcl     Other reaction(s): Other (See Comments) Sedation and myoclonic jerks of all four extremities. Noted 02/19/21.     Family History: Family History  Family history unknown: Yes    Social History:  reports that he has never smoked. He has never used smokeless tobacco. He reports that he does not drink alcohol and does not use drugs.   Physical Exam: BP 119/77   Pulse 90   Ht '5\' 10"'$  (1.778 m)   Wt 164 lb (74.4 kg)   BMI 23.53 kg/m    Constitutional:  Alert and oriented, No acute distress. HEENT: [place] AT, moist mucus membranes.  Trachea midline, no masses. Cardiovascular: No clubbing, cyanosis, or edema. Respiratory: Normal respiratory effort, no increased work of breathing. GU: Prostate 60+ cc, smooth without nodules  Laboratory Data:  Urinalysis Dipstick/microscopy negative   Assessment & Plan:    1.  Urethral spotting Most likely secondary to BPH Discussed cystoscopy however he does not desire to schedule unless the problem continues to recur  2.  Elevated PSA Mild elevation most likely secondary to BPH Based on prostate cancer screening guidelines and age would recommend discontinuing PSA screening    Abbie Sons, MD  [facility] [address], [place], [place] [address] [phone]

## 2022-05-18 LAB — URINALYSIS, COMPLETE
Bilirubin, UA: NEGATIVE
Glucose, UA: NEGATIVE
Ketones, UA: NEGATIVE
Leukocytes,UA: NEGATIVE
Nitrite, UA: NEGATIVE
Protein,UA: NEGATIVE
Specific Gravity, UA: 1.015 (ref 1.005–1.030)
Urobilinogen, Ur: 0.2 mg/dL (ref 0.2–1.0)
pH, UA: 5 (ref 5.0–7.5)

## 2022-05-18 LAB — MICROSCOPIC EXAMINATION

## 2022-05-20 ENCOUNTER — Encounter: Payer: Self-pay | Admitting: Family Medicine

## 2022-05-20 ENCOUNTER — Ambulatory Visit (INDEPENDENT_AMBULATORY_CARE_PROVIDER_SITE_OTHER): Payer: Medicare Other | Admitting: Family Medicine

## 2022-05-20 VITALS — BP 121/71 | HR 70 | Resp 16 | Ht 70.0 in | Wt 164.0 lb

## 2022-05-20 DIAGNOSIS — R739 Hyperglycemia, unspecified: Secondary | ICD-10-CM | POA: Insufficient documentation

## 2022-05-20 DIAGNOSIS — I872 Venous insufficiency (chronic) (peripheral): Secondary | ICD-10-CM

## 2022-05-20 DIAGNOSIS — Z23 Encounter for immunization: Secondary | ICD-10-CM | POA: Diagnosis not present

## 2022-05-20 DIAGNOSIS — I10 Essential (primary) hypertension: Secondary | ICD-10-CM | POA: Diagnosis not present

## 2022-05-20 DIAGNOSIS — Z0000 Encounter for general adult medical examination without abnormal findings: Secondary | ICD-10-CM | POA: Diagnosis not present

## 2022-05-20 MED ORDER — AMLODIPINE BESYLATE 2.5 MG PO TABS: ORAL_TABLET | ORAL | 3 refills | Status: DC

## 2022-05-20 NOTE — Progress Notes (Signed)
Complete physical exam   Patient: James Mora   DOB: Jul 10, 1936   86 y.o. Male  MRN: [id number] Visit Date: 05/20/2022  Today's healthcare provider: [name], [name]  Introduced to nurse practitioner role and practice setting.  All questions answered.  Discussed provider/patient relationship and expectations.  I,Tiffany J Bragg,acting as a scribe for [name], [name].,have documented all relevant documentation on the behalf of [name], [name],as directed by  [name], [name] while in the presence of [name], [name].   Chief Complaint  Patient presents with   Annual Exam   Subjective    James Mora is a 86 y.o. male who presents today for a complete physical exam.  He reports consuming a general diet. Home exercise routine includes walking. He generally feels well. He reports sleeping well. He does have additional problems to discuss today. Patient states his middle to low back hurts often; has been using 500 mg Tylenol to help.  HPI   Reports some low days; however, denies concern for medication to manage depression at this time.  Past Medical History:  Diagnosis Date   Acid reflux    Aortic insufficiency    a. 03/2016 Echo: EF 55-60%, no rwma, Gr1 DD, mild to mod AI, mild TR.   CAD (coronary artery disease)    a. 2000 Cath: Severe distal LAD disease with left to left collaterals;  b. 2008 MV: non-ischemic; c. 03/2016 Cath: LM nl, LAD 30p, D1/D2 mod, nl, LCX nl, OM1/2 nl w/ L-->L collats to apical LAD, RCA dominant, nl. EF 55-65%.   Hypertensive heart disease 2005   unspecified   Mixed hyperlipidemia    mixed   Past Surgical History:  Procedure Laterality Date   CARDIAC CATHETERIZATION  2000   left heart   CARDIAC CATHETERIZATION N/A 03/22/2016   Procedure: Left Heart Cath And Coronary Angiography;  Surgeon: Minna Merritts, MD;  Location: [facility];  Service: Cardiovascular;  Laterality: N/A;   COLONOSCOPY  2008   Dr. Allen Norris   Social History    Socioeconomic History   Marital status: Married    Spouse name: Not on file   Number of children: 1   Years of education: Not on file   Highest education level: Not on file  Occupational History   Not on file  Tobacco Use   Smoking status: Never   Smokeless tobacco: Never  Vaping Use   Vaping Use: Never used  Substance and Sexual Activity   Alcohol use: No   Drug use: No   Sexual activity: Not on file  Other Topics Concern   Not on file  Social History Narrative   Not on file   Social Determinants of Health   Financial Resource Strain: Low Risk  (04/19/2022)   Overall Financial Resource Strain (CARDIA)    Difficulty of Paying Living Expenses: Not hard at all  Food Insecurity: No Food Insecurity (04/19/2022)   Hunger Vital Sign    Worried About Running Out of Food in the Last Year: Never true    Ran Out of Food in the Last Year: Never true  Transportation Needs: No Transportation Needs (04/19/2022)   PRAPARE - Hydrologist (Medical): No    Lack of Transportation (Non-Medical): No  Physical Activity: Sufficiently Active (04/19/2022)   Exercise Vital Sign    Days of Exercise per Week: 7 days    Minutes of Exercise per Session:  60 min  Stress: No Stress Concern Present (04/19/2022)   [place]    Feeling of Stress : Only a little  Social Connections: Moderately Isolated (04/19/2022)   Social Connection and Isolation Panel [NHANES]    Frequency of Communication with Friends and Family: More than three times a week    Frequency of Social Gatherings with Friends and Family: Three times a week    Attends Religious Services: Never    Active Member of Clubs or Organizations: No    Attends Archivist Meetings: Never    Marital Status: Married  Human resources officer Violence: Not At Risk (04/19/2022)   Humiliation, Afraid, Rape, and Kick questionnaire    Fear of Current or  Ex-Partner: No    Emotionally Abused: No    Physically Abused: No    Sexually Abused: No   Family Status  Relation Name Status   Mother  Deceased       killed when he was 40 months old   Father  Deceased       killed when he was 56 months old   Son  Alive   Family History  Family history unknown: Yes   Allergies  Allergen Reactions   Imodium [Loperamide] Nausea And Vomiting   Hydroxyzine Hcl     Other reaction(s): Other (See Comments) Sedation and myoclonic jerks of all four extremities. Noted 02/19/21.     Patient Care Team: [name], [name] as PCP - General (Family Medicine) [place], MD as PCP - Cardiology (Cardiology) Germaine Pomfret, [facility] (Pharmacist) Wellington Hampshire, MD as Consulting Physician (Cardiology)   Medications: Outpatient Medications Prior to Visit  Medication Sig   acetaminophen (TYLENOL) 500 MG tablet Take 500 mg by mouth every 6 (six) hours as needed.   amoxicillin (AMOXIL) 500 MG tablet Take 500 mg by mouth 3 (three) times daily.   aspirin 81 MG tablet Take 81 mg by mouth daily.   atorvastatin (LIPITOR) 20 MG tablet TAKE 1 TABLET(20 MG) BY MOUTH DAILY AT 6 PM   calcium carbonate (TUMS - DOSED IN MG ELEMENTAL CALCIUM) 500 MG chewable tablet Chew 1 tablet by mouth 2 (two) times daily as needed for indigestion or heartburn.   carvedilol (COREG) 12.5 MG tablet TAKE 1 TABLET(12.5 MG) BY MOUTH TWICE DAILY   CREON 36000-114000 units CPEP capsule TAKE 1 CAPSULE BY MOUTH THREE TIMES DAILY WITH MEALS   furosemide (LASIX) 20 MG tablet Take 1 tablet (20 mg total) by mouth daily.   nitroGLYCERIN (NITROSTAT) 0.4 MG SL tablet Place 0.4 mg under the tongue every 5 (five) minutes as needed.   omeprazole (PRILOSEC) 20 MG capsule TAKE 2 CAPSULES(40 MG) BY MOUTH DAILY   tamsulosin (FLOMAX) 0.4 MG CAPS capsule TAKE 1 CAPSULE(0.4 MG) BY MOUTH DAILY   Thiamine HCl (B-1) 100 MG TABS TAKE 1 TABLET BY MOUTH EVERY DAY   [DISCONTINUED] amLODipine (NORVASC) 2.5 MG  tablet TAKE 1 TABLET(2.5 MG) BY MOUTH DAILY   No facility-administered medications prior to visit.    Review of Systems  HENT:  Positive for hearing loss.   Respiratory:  Positive for shortness of breath.   Hematological:  Bruises/bleeds easily.    Last CBC Lab Results  Component Value Date   WBC 5.4 09/08/2021   HGB 10.0 (L) 09/08/2021   HCT 32.9 (L) 09/08/2021   MCV 84 09/08/2021   MCH 25.5 (L) 09/08/2021   RDW 15.7 (H) 09/08/2021   PLT  234 35/32/9924   Last metabolic panel Lab Results  Component Value Date   GLUCOSE 105 (H) 09/25/2021   NA 144 09/25/2021   K 4.3 09/25/2021   CL 108 (H) 09/25/2021   CO2 25 09/25/2021   BUN 20 09/25/2021   CREATININE 1.22 09/25/2021   EGFR 59 (L) 09/25/2021   CALCIUM 8.9 09/25/2021   PROT 6.5 09/08/2021   ALBUMIN 3.6 09/08/2021   LABGLOB 2.9 09/08/2021   AGRATIO 1.2 09/08/2021   BILITOT 0.5 09/08/2021   ALKPHOS 101 09/08/2021   AST 10 09/08/2021   ALT 8 09/08/2021   ANIONGAP 5 02/27/2021   Last lipids Lab Results  Component Value Date   CHOL 125 02/01/2021   HDL 49 02/01/2021   LDLCALC 62 02/01/2021   TRIG 68 02/01/2021   CHOLHDL 2.6 02/01/2021   Last thyroid functions Lab Results  Component Value Date   TSH 7.750 (H) 09/08/2021     Objective    BP 121/71 (BP Location: Right Arm, Patient Position: Sitting, Cuff Size: Normal)   Pulse 70   Resp 16   Ht _0  (1.778 m)   Wt 164 lb (74.4 kg)   SpO2 99%   BMI 23.53 kg/m   BP Readings from Last 3 Encounters:  05/20/22 121/71  05/17/22 119/77  04/27/22 (!) 147/91   Wt Readings from Last 3 Encounters:  05/20/22 164 lb (74.4 kg)  05/17/22 164 lb (74.4 kg)  04/27/22 172 lb (78 kg)   SpO2 Readings from Last 3 Encounters:  05/20/22 99%  04/27/22 100%  02/24/22 95%   Physical Exam Vitals and nursing note reviewed.  Constitutional:      General: He is awake. He is not in acute distress.    Appearance: Normal appearance. He is well-developed, well-groomed and  normal weight. He is not ill-appearing, toxic-appearing or diaphoretic.  HENT:     Head: Normocephalic and atraumatic.     Jaw: There is normal jaw occlusion. No trismus, tenderness, swelling or pain on movement.     Salivary Glands: Right salivary gland is not diffusely enlarged or tender. Left salivary gland is not diffusely enlarged or tender.     Right Ear: Hearing, tympanic membrane, ear canal and external ear normal. There is no impacted cerumen.     Left Ear: Hearing, tympanic membrane, ear canal and external ear normal. There is no impacted cerumen.     Nose: Nose normal. No congestion or rhinorrhea.     Right Turbinates: Not enlarged, swollen or pale.     Left Turbinates: Not enlarged, swollen or pale.     Right Sinus: No maxillary sinus tenderness or frontal sinus tenderness.     Left Sinus: No maxillary sinus tenderness or frontal sinus tenderness.     Mouth/Throat:     Lips: Pink.     Mouth: Mucous membranes are moist. No injury, lacerations, oral lesions or angioedema.     Pharynx: Oropharynx is clear. Uvula midline. No pharyngeal swelling, oropharyngeal exudate or posterior oropharyngeal erythema.     Tonsils: No tonsillar exudate or tonsillar abscesses.  Eyes:     General: Lids are normal. Vision grossly intact. Gaze aligned appropriately.        Right eye: No discharge.        Left eye: No discharge.     Extraocular Movements: Extraocular movements intact.     Conjunctiva/sclera: Conjunctivae normal.     Pupils: Pupils are equal, round, and reactive to light.     Comments: Due  for eye exam   Neck:     Thyroid: No thyroid mass, thyromegaly or thyroid tenderness.     Vascular: No carotid bruit.     Trachea: Trachea normal. No tracheal tenderness.  Cardiovascular:     Rate and Rhythm: Normal rate and regular rhythm.     Pulses:          Carotid pulses are 2+ on the right side and 2+ on the left side.      Radial pulses are 2+ on the right side and 2+ on the left side.        Femoral pulses are 2+ on the right side and 2+ on the left side.      Popliteal pulses are 2+ on the right side and 2+ on the left side.       Dorsalis pedis pulses are 1+ on the right side and 2+ on the left side.       Posterior tibial pulses are 1+ on the right side and 2+ on the left side.     Heart sounds: Normal heart sounds, S1 normal and S2 normal. No murmur heard.    No friction rub. No gallop.     Comments: R ankle cool Pulmonary:     Effort: Pulmonary effort is normal. No respiratory distress.     Breath sounds: Normal breath sounds and air entry. No stridor. No wheezing, rhonchi or rales.  Chest:     Chest wall: No tenderness.  Abdominal:     General: Abdomen is flat. Bowel sounds are normal. There is no distension.     Palpations: Abdomen is soft. There is no mass.     Tenderness: There is no abdominal tenderness. There is no guarding or rebound.     Hernia: No hernia is present.  Genitourinary:    Comments: Exam deferred; reports hx of blood on undergarments- was seen by urology who also follows his PSA Musculoskeletal:        General: No swelling, tenderness, deformity or signs of injury. Normal range of motion.     Cervical back: Normal range of motion and neck supple. No rigidity or tenderness.     Right lower leg: 1+ Edema present.     Left lower leg: No edema.  Lymphadenopathy:     Cervical: No cervical adenopathy.     Right cervical: No superficial, deep or posterior cervical adenopathy.    Left cervical: No superficial, deep or posterior cervical adenopathy.  Skin:    General: Skin is warm and dry.     Capillary Refill: Capillary refill takes less than 2 seconds.     Coloration: Skin is not jaundiced or pale.     Findings: No bruising, erythema, lesion or rash.  Neurological:     General: No focal deficit present.     Mental Status: He is alert and oriented to person, place, and time. Mental status is at baseline.     GCS: GCS eye subscore is 4. GCS verbal  subscore is 5. GCS motor subscore is 6.     Sensory: Sensation is intact. No sensory deficit.     Motor: Motor function is intact. No weakness.     Coordination: Coordination is intact.     Gait: Gait is intact.  Psychiatric:        Attention and Perception: Attention and perception normal.        Mood and Affect: Mood and affect normal.        Speech: Speech  normal.        Behavior: Behavior normal. Behavior is cooperative.        Thought Content: Thought content normal.        Cognition and Memory: Cognition normal.        Judgment: Judgment normal.     Last depression screening scores    05/20/2022    9:42 AM 04/19/2022   10:04 AM 12/07/2021    9:11 AM  PHQ 2/9 Scores  PHQ - 2 Score _0 PHQ- 9 Score 7 1    Last fall risk screening    05/20/2022    9:41 AM  [place] in the past year? 1  Injury with Fall? 0   Last Audit-C alcohol use screening    05/20/2022    9:42 AM  Alcohol Use Disorder Test (AUDIT)  1. How often do you have a drink containing alcohol? 0  2. How many drinks containing alcohol do you have on a typical day when you are drinking? 0  3. How often do you have six or more drinks on one occasion? 0  AUDIT-C Score 0   A score of 3 or more in women, and 4 or more in men indicates increased risk for alcohol abuse, EXCEPT if all of the points are from question 1   No results found for any visits on 05/20/22.  Assessment & Plan    Routine Health Maintenance and Physical Exam  Exercise Activities and Dietary recommendations  Goals      DIET - EAT MORE FRUITS AND VEGETABLES     Track and Manage My Blood Pressure-Hypertension     Timeframe:  Long-Range Goal Priority:  High Start Date: 06/18/2021                            Expected End Date: 06/18/2022                      Follow Up within 90 days   - check blood pressure weekly    Why is this important?   You won't feel high blood pressure, but it can still hurt your blood vessels.   High blood pressure can cause heart or kidney problems. It can also cause a stroke.  Making lifestyle changes like losing a little weight or eating less salt will help.  Checking your blood pressure at home and at different times of the day can help to control blood pressure.  If the doctor prescribes medicine remember to take it the way the doctor ordered.  Call the office if you cannot afford the medicine or if there are questions about it.     Notes:         Immunization History  Administered Date(s) Administered   Fluad Quad(high Dose 65+) 06/05/2021, 05/20/2022   Influenza, High Dose Seasonal PF 06/21/2019   PFIZER Comirnaty(Gray Top)Covid-19 Tri-Sucrose Vaccine 09/10/2019, 10/01/2019, 02/25/2021   PFIZER(Purple Top)SARS-COV-2 Vaccination 09/10/2019, 10/01/2019   Pneumococcal Conjugate-13 02/05/2020    Health Maintenance  Topic Date Due   TETANUS/TDAP  Never done   Zoster Vaccines- Shingrix (1 of 2) Never done   Pneumonia Vaccine 37+ Years old (2 - PPSV23 or PCV20) 02/04/2021   COVID-19 Vaccine (6 - Pfizer series) 04/22/2021   INFLUENZA VACCINE  Completed   HPV VACCINES  Aged Out    Discussed health benefits of physical activity, and encouraged him to engage in  regular exercise appropriate for his age and condition.  Problem List Items Addressed This Visit       Cardiovascular and Mediastinum   Essential hypertension    Chronic, stable Due for refills of norvasc Goal <140/<80      Relevant Medications   amLODipine (NORVASC) 2.5 MG tablet   Venous insufficiency of right leg    Chronic, stable Slight edema noted at R ankle with associated coolness; non pitting in nature Pt denies complaints at this time; has been able to exercise and is driving within the neighborhood       Relevant Medications   amLODipine (NORVASC) 2.5 MG tablet     Other   Annual physical exam - Primary    GI complaints resolving Due for vision and dental Things to do to keep yourself  healthy  - Exercise at least 30-45 minutes a day, 3-4 days a week.  - Eat a low-fat diet with lots of fruits and vegetables, up to 7-9 servings per day.  - Seatbelts can save your life. Wear them always.  - Smoke detectors on every level of your home, check batteries every year.  - Eye Doctor - have an eye exam every 1-2 years  - Safe sex - if you may be exposed to STDs, use a condom.  - Alcohol -  If you drink, do it moderately, less than 2 drinks per day.  - [place]. Choose someone to speak for you if you are not able.  - Depression is common in our stressful world.If you're feeling down or losing interest in things you normally enjoy, please come in for a visit.  - Violence - If anyone is threatening or hurting you, please call immediately.        Relevant Orders   CBC with Differential/Platelet   Comprehensive Metabolic Panel (CMET)   TSH + free T4   Lipid panel   Elevated serum glucose    Check A1c given elevated serum glucose; Continue to recommend balanced, lower carb meals. Smaller meal size, adding snacks. Choosing water as drink of choice and increasing purposeful exercise.       Relevant Orders   Hemoglobin A1c   Need for influenza vaccination    Consented; VIS made available; no immediate side effects following administration; plan to repeat annually        Relevant Orders   Flu Vaccine QUAD High Dose(Fluad) (Completed)   Return in about 6 months (around 11/19/2022) for chonic disease management.     Vonna Kotyk, [name], have reviewed all documentation for this visit. The documentation on 05/20/22 for the exam, diagnosis, procedures, and orders are all accurate and complete.  [name], [place] [phone] (phone) [phone] (fax)  [place]

## 2022-05-20 NOTE — Assessment & Plan Note (Signed)
Chronic, stable Slight edema noted at R ankle with associated coolness; non pitting in nature Pt denies complaints at this time; has been able to exercise and is driving within the neighborhood

## 2022-05-20 NOTE — Assessment & Plan Note (Signed)
Check A1c given elevated serum glucose; Continue to recommend balanced, lower carb meals. Smaller meal size, adding snacks. Choosing water as drink of choice and increasing purposeful exercise.

## 2022-05-20 NOTE — Assessment & Plan Note (Signed)
Chronic, stable Due for refills of norvasc Goal <140/<80

## 2022-05-20 NOTE — Assessment & Plan Note (Signed)
GI complaints resolving Due for vision and dental Things to do to keep yourself healthy  - Exercise at least 30-45 minutes a day, 3-4 days a week.  - Eat a low-fat diet with lots of fruits and vegetables, up to 7-9 servings per day.  - Seatbelts can save your life. Wear them always.  - Smoke detectors on every level of your home, check batteries every year.  - Eye Doctor - have an eye exam every 1-2 years  - Safe sex - if you may be exposed to STDs, use a condom.  - Alcohol -  If you drink, do it moderately, less than 2 drinks per day.  - [place]. Choose someone to speak for you if you are not able.  - Depression is common in our stressful world.If you're feeling down or losing interest in things you normally enjoy, please come in for a visit.  - Violence - If anyone is threatening or hurting you, please call immediately.

## 2022-05-20 NOTE — Assessment & Plan Note (Signed)
Consented; VIS made available; no immediate side effects following administration; plan to repeat annually   

## 2022-05-21 ENCOUNTER — Other Ambulatory Visit: Payer: Self-pay | Admitting: Family Medicine

## 2022-05-21 LAB — COMPREHENSIVE METABOLIC PANEL
ALT: 13 IU/L (ref 0–44)
AST: 9 IU/L (ref 0–40)
Albumin/Globulin Ratio: 1.7 (ref 1.2–2.2)
Albumin: 4.1 g/dL (ref 3.7–4.7)
Alkaline Phosphatase: 93 IU/L (ref 44–121)
BUN/Creatinine Ratio: 15 (ref 10–24)
BUN: 18 mg/dL (ref 8–27)
Bilirubin Total: 0.6 mg/dL (ref 0.0–1.2)
CO2: 21 mmol/L (ref 20–29)
Calcium: 9 mg/dL (ref 8.6–10.2)
Chloride: 111 mmol/L — ABNORMAL HIGH (ref 96–106)
Creatinine, Ser: 1.19 mg/dL (ref 0.76–1.27)
Globulin, Total: 2.4 g/dL (ref 1.5–4.5)
Glucose: 101 mg/dL — ABNORMAL HIGH (ref 70–99)
Potassium: 4.3 mmol/L (ref 3.5–5.2)
Sodium: 145 mmol/L — ABNORMAL HIGH (ref 134–144)
Total Protein: 6.5 g/dL (ref 6.0–8.5)
eGFR: 60 mL/min/{1.73_m2} (ref >59)

## 2022-05-21 LAB — CBC WITH DIFFERENTIAL/PLATELET
Basophils Absolute: 0 10*3/uL (ref 0.0–0.2)
Basos: 0 %
EOS (ABSOLUTE): 0.1 10*3/uL (ref 0.0–0.4)
Eos: 1 %
Hematocrit: 38.4 % (ref 37.5–51.0)
Hemoglobin: 12.6 g/dL — ABNORMAL LOW (ref 13.0–17.7)
Immature Grans (Abs): 0 10*3/uL (ref 0.0–0.1)
Immature Granulocytes: 0 %
Lymphocytes Absolute: 1.1 10*3/uL (ref 0.7–3.1)
Lymphs: 21 %
MCH: 29.4 pg (ref 26.6–33.0)
MCHC: 32.8 g/dL (ref 31.5–35.7)
MCV: 90 fL (ref 79–97)
Monocytes Absolute: 0.4 10*3/uL (ref 0.1–0.9)
Monocytes: 7 %
Neutrophils Absolute: 3.5 10*3/uL (ref 1.4–7.0)
Neutrophils: 71 %
Platelets: 174 10*3/uL (ref 150–450)
RBC: 4.29 x10E6/uL (ref 4.14–5.80)
RDW: 13.9 % (ref 11.6–15.4)
WBC: 5 10*3/uL (ref 3.4–10.8)

## 2022-05-21 LAB — LIPID PANEL
Chol/HDL Ratio: 2.3 ratio (ref 0.0–5.0)
Cholesterol, Total: 99 mg/dL — ABNORMAL LOW (ref 100–199)
HDL: 44 mg/dL (ref >39)
LDL Chol Calc (NIH): 39 mg/dL (ref 0–99)
Triglycerides: 80 mg/dL (ref 0–149)
VLDL Cholesterol Cal: 16 mg/dL (ref 5–40)

## 2022-05-21 LAB — HEMOGLOBIN A1C
Est. average glucose Bld gHb Est-mCnc: 126 mg/dL
Hgb A1c MFr Bld: 6 % — ABNORMAL HIGH (ref 4.8–5.6)

## 2022-05-21 LAB — TSH+FREE T4
Free T4: 0.95 ng/dL (ref 0.82–1.77)
TSH: 5.69 u[IU]/mL — ABNORMAL HIGH (ref 0.450–4.500)

## 2022-05-21 MED ORDER — LEVOTHYROXINE SODIUM 50 MCG PO TABS: 50.0000 ug | ORAL_TABLET | Freq: Every day | ORAL | 0 refills | Status: DC

## 2022-05-21 NOTE — Progress Notes (Signed)
Anemia is much improved, and almost gone.  Pre-diabetes remains; however, well controlled. 6% Continue to recommend balanced, lower carb meals. Smaller meal size, adding snacks. Choosing water as drink of choice and increasing purposeful exercise.  Thyroid is improved; however, remains over stimulated and low functioning.  [name], [address] [place], [place] [address] [phone] (phone) [phone] (fax) [place]

## 2022-05-28 ENCOUNTER — Telehealth: Payer: Self-pay

## 2022-05-28 NOTE — Progress Notes (Signed)
    Chronic Care Management Pharmacy Assistant   Name: Damar Petit Greenwalt  MRN: [id number] DOB: 10-Feb-1936  Reason for Encounter: Medication Review/Patient assistance renewal for Creon.   Medications: Outpatient Encounter Medications as of 05/28/2022  Medication Sig   acetaminophen (TYLENOL) 500 MG tablet Take 500 mg by mouth every 6 (six) hours as needed.   amLODipine (NORVASC) 2.5 MG tablet TAKE 1 TABLET(2.5 MG) BY MOUTH DAILY   amoxicillin (AMOXIL) 500 MG tablet Take 500 mg by mouth 3 (three) times daily.   aspirin 81 MG tablet Take 81 mg by mouth daily.   atorvastatin (LIPITOR) 20 MG tablet TAKE 1 TABLET(20 MG) BY MOUTH DAILY AT 6 PM   calcium carbonate (TUMS - DOSED IN MG ELEMENTAL CALCIUM) 500 MG chewable tablet Chew 1 tablet by mouth 2 (two) times daily as needed for indigestion or heartburn.   carvedilol (COREG) 12.5 MG tablet TAKE 1 TABLET(12.5 MG) BY MOUTH TWICE DAILY   CREON 36000-114000 units CPEP capsule TAKE 1 CAPSULE BY MOUTH THREE TIMES DAILY WITH MEALS   furosemide (LASIX) 20 MG tablet Take 1 tablet (20 mg total) by mouth daily.   levothyroxine (SYNTHROID) 50 MCG tablet Take 1 tablet (50 mcg total) by mouth daily. Take with water before other food, drink, medications. Complete labs in 6-8 weeks following start.   nitroGLYCERIN (NITROSTAT) 0.4 MG SL tablet Place 0.4 mg under the tongue every 5 (five) minutes as needed.   omeprazole (PRILOSEC) 20 MG capsule TAKE 2 CAPSULES(40 MG) BY MOUTH DAILY   tamsulosin (FLOMAX) 0.4 MG CAPS capsule TAKE 1 CAPSULE(0.4 MG) BY MOUTH DAILY   Thiamine HCl (B-1) 100 MG TABS TAKE 1 TABLET BY MOUTH EVERY DAY   [DISCONTINUED] lovastatin (ALTOPREV) 40 MG 24 hr tablet Take 80 mg by mouth at bedtime.     [DISCONTINUED] simvastatin (ZOCOR) 40 MG tablet Take 40 mg by mouth at bedtime.     No facility-administered encounter medications on file as of 05/28/2022.    Patient assistance renewal for Creon:  I received a task from Junius Argyle, CPP  requesting that I start the renewal application for patient assistance on the medication Creon to continue assistance through year 2024.    Spoke with the patient and inform him that the application will be mailed.I informed  him once he receives the application he will need to complete his part of the application and return it to his PCP office for Junius Argyle, CPP to fax over to the [facility] for processing.Informed patient to include a copy of his proof of income.  Patient verbalized understanding and was provided my phone number of [phone] if he has any questions.   Application emailed to Junius Argyle, CPP for review and to mail to patient home.  [place] Pharmacist Assistant [phone]

## 2022-07-01 ENCOUNTER — Other Ambulatory Visit: Payer: Self-pay | Admitting: Family Medicine

## 2022-07-01 DIAGNOSIS — K219 Gastro-esophageal reflux disease without esophagitis: Secondary | ICD-10-CM

## 2022-07-02 NOTE — Telephone Encounter (Signed)
Requested Prescriptions  Pending Prescriptions Disp Refills   omeprazole (PRILOSEC) 20 MG capsule [Pharmacy Med Name: OMEPRAZOLE '20MG'$  CAPSULES] 180 capsule 0    Sig: TAKE 2 CAPSULES(40 MG) BY MOUTH DAILY     Gastroenterology: Proton Pump Inhibitors Passed - 07/01/2022  3:30 AM      Passed - Valid encounter within last 12 months    Recent Outpatient Visits           1 month ago Annual physical exam   [name], [name]   6 months ago Aortic valve insufficiency, etiology of cardiac valve disease unspecified   [name], [name]   9 months ago Essential hypertension   [name], [name]   1 year ago Essential hypertension   [name], [name]   1 year ago Hypotension due to hypovolemia   [name], [name]       Future Appointments             In 4 months [name], [name], [name]

## 2022-07-21 ENCOUNTER — Other Ambulatory Visit: Payer: Self-pay | Admitting: Family Medicine

## 2022-07-29 ENCOUNTER — Other Ambulatory Visit: Payer: Self-pay | Admitting: Family Medicine

## 2022-07-29 DIAGNOSIS — K219 Gastro-esophageal reflux disease without esophagitis: Secondary | ICD-10-CM

## 2022-08-19 ENCOUNTER — Other Ambulatory Visit: Payer: Self-pay | Admitting: Family Medicine

## 2022-08-26 ENCOUNTER — Telehealth: Payer: Self-pay | Admitting: Family Medicine

## 2022-08-26 DIAGNOSIS — K851 Biliary acute pancreatitis without necrosis or infection: Secondary | ICD-10-CM

## 2022-08-26 NOTE — Telephone Encounter (Signed)
Medication Refill - Medication: CREON 36000-114000 units CPEP capsule   Has the patient contacted their pharmacy? No. (Agent: If no, request that the patient contact the pharmacy for the refill. If patient does not wish to contact the pharmacy document the reason why and proceed with request.) (Agent: If yes, when and what did the pharmacy advise?)  Preferred Pharmacy (with phone number or street name):  [facility], [place] - [address] Phone: [phone]  Fax: [phone]     Has the patient been seen for an appointment in the last year OR does the patient have an upcoming appointment? Yes.    Agent: Please be advised that RX refills may take up to 3 business days. We ask that you follow-up with your pharmacy.

## 2022-08-26 NOTE — Telephone Encounter (Signed)
Requested medication (s) are due for refill today - yes  Requested medication (s) are on the active medication list -yes  Future visit scheduled -yes  Last refill: 11/02/21 #270 1RF  Notes to clinic: medication not assigned protocol- provider review   Requested Prescriptions  Pending Prescriptions Disp Refills   lipase/protease/amylase (CREON) 36000 UNITS CPEP capsule 270 capsule 1     Off-Protocol Failed - 08/26/2022 10:46 AM      Failed - Medication not assigned to a protocol, review manually.      Passed - Valid encounter within last 12 months    Recent Outpatient Visits           3 months ago Annual physical exam   [facility] Tally Joe T, [name]   8 months ago Aortic valve insufficiency, etiology of cardiac valve disease unspecified   [facility] [name], [name]   11 months ago Essential hypertension   [facility] [name], [name]   1 year ago Essential hypertension   [facility] Tally Joe T, [name]   1 year ago Hypotension due to hypovolemia   [facility] [name], [name]       Future Appointments             In 2 months [name], [place], [facility]               Requested Prescriptions  Pending Prescriptions Disp Refills   lipase/protease/amylase (CREON) 36000 UNITS CPEP capsule 270 capsule 1     Off-Protocol Failed - 08/26/2022 10:46 AM      Failed - Medication not assigned to a protocol, review manually.      Passed - Valid encounter within last 12 months    Recent Outpatient Visits           3 months ago Annual physical exam   [facility] Tally Joe T, [name]   8 months ago Aortic valve insufficiency, etiology of cardiac valve disease unspecified   [facility] [name], [name]   11 months ago Essential hypertension   [facility] [name], [name]   1 year ago Essential hypertension    [facility] Tally Joe T, [name]   1 year ago Hypotension due to hypovolemia   [facility] [name], [name]       Future Appointments             In 2 months [name], [place], [facility]

## 2022-08-27 MED ORDER — PANCRELIPASE (LIP-PROT-AMYL) 36000-114000 UNITS PO CPEP: ORAL_CAPSULE | ORAL | 1 refills | Status: DC

## 2022-08-30 NOTE — Telephone Encounter (Signed)
Patients wife Dre Gamino is calling in stating the mediation should have been sent to: [place] [address] [email or web address]  [phone]  For patients refill. Per Lesleigh Noe this pharmacy will send the patients medication free.   Lesleigh Noe states that patients medication was sent to [place] is out of stock. Lesleigh Noe would like for the [place] [address] [email or web address]  [phone]      Please advise.

## 2022-08-31 ENCOUNTER — Other Ambulatory Visit: Payer: Self-pay

## 2022-08-31 DIAGNOSIS — K851 Biliary acute pancreatitis without necrosis or infection: Secondary | ICD-10-CM

## 2022-09-09 ENCOUNTER — Ambulatory Visit: Payer: Medicare Other | Admitting: Medical

## 2022-09-14 NOTE — Telephone Encounter (Signed)
Pts RX needs to go to [facility] / this is the number to the med assistance program that can get pts med for free / All James Mora needs to do is fax a RX # / please advise and give them a call / this is for pts lipase/protease/amylase (CREON) 36000 UNITS CPEP capsule [[id number]]

## 2022-09-15 NOTE — Telephone Encounter (Signed)
Looks like you need to send in an Rx for Creon to [facility].

## 2022-09-17 ENCOUNTER — Other Ambulatory Visit: Payer: Self-pay | Admitting: Family Medicine

## 2022-09-17 DIAGNOSIS — K851 Biliary acute pancreatitis without necrosis or infection: Secondary | ICD-10-CM

## 2022-09-17 MED ORDER — PANCRELIPASE (LIP-PROT-AMYL) 36000-114000 UNITS PO CPEP: ORAL_CAPSULE | ORAL | 1 refills | Status: AC

## 2022-09-20 NOTE — Progress Notes (Unsigned)
Cardiology Office Note:    Date:  09/21/2022   ID:  James Mora, DOB 05/14/1936, MRN [id number]  PCP:  James Sprout, [name]  [facility] Cardiologist:  [name], MD  [facility] Electrophysiologist:  None   Referring MD: James Sprout, [name]   Chief Complaint: 6 month follow-up  History of Present Illness:    James Mora is a 87 y.o. male with a hx of CAD, aortic insuffiencey, hypertensive heart disease, CKD stage, HLD who presents for 6 month follow-up.     Cardiac catheterization in 2000 showed severe distal LAD disease with left to left collaterals that was managed medically at that time.  He was hospitalized in August 2017 with non-ST elevation myocardial infarction. LHC showed mild proximal LAD disease with 30% stenosis and possible occlusion of a small PL 3.  Ejection fraction was normal.  Medical management was recommended.  Echocardiogram showed normal LV systolic function with stable mild to moderate aortic insufficiency.    He had significant leg edema that improved after stopping amlodipine.     He was hospitalized in July 2022 with acute pancreatitis with gallstones and enteritis.  The patient had peripancreatic edema resulting in significant abdominal pain.  He had an MRCP done which showed no evidence of stones but there was tapering of the common bile duct. The patient was [facility] where he stayed there for another week and then he was discharged to rehab.  The plan was to proceed with cholecystectomy but giving ongoing issues with abdominal pain, that has not happened yet.   Seen 09/17/21 for pre-op cardiovascular evaluation for cholecystectomy. He was 8-10lbs up. Echo was ordered and lasix 20 mg daily. Echo showed LVEF 50-55%, no WMA, G1DD, mild MR, mild artic root dilation measuring 20m, frequent PCs.   Last seen 02/2022 and was overall doing well from a cardiac perspective.   Today, the patient reports he is overall doing well. Reports overall  improved strength, although he does have days he feels weak. He doesn't use a cane or walker. Denies dizziness and lightheadedness. No chest pain. Occasional SOB when walking. They walk every hour around the house.   Past Medical History:  Diagnosis Date   Acid reflux    Aortic insufficiency    a. 03/2016 Echo: EF 55-60%, no rwma, Gr1 DD, mild to mod AI, mild TR.   CAD (coronary artery disease)    a. 2000 Cath: Severe distal LAD disease with left to left collaterals;  b. 2008 MV: non-ischemic; c. 03/2016 Cath: LM nl, LAD 30p, D1/D2 mod, nl, LCX nl, OM1/2 nl w/ L-->L collats to apical LAD, RCA dominant, nl. EF 55-65%.   Hypertensive heart disease 2005   unspecified   Mixed hyperlipidemia    mixed    Past Surgical History:  Procedure Laterality Date   CARDIAC CATHETERIZATION  2000   left heart   CARDIAC CATHETERIZATION N/A 03/22/2016   Procedure: Left Heart Cath And Coronary Angiography;  Surgeon: TMinna Merritts MD;  [facility];  Service: Cardiovascular;  Laterality: N/A;   COLONOSCOPY  2008   Dr. WAllen Norris   Current Medications: Current Meds  Medication Sig   acetaminophen (TYLENOL) 500 MG tablet Take 500 mg by mouth every 6 (six) hours as needed.   amLODipine (NORVASC) 2.5 MG tablet TAKE 1 TABLET(2.5 MG) BY MOUTH DAILY   aspirin 81 MG tablet Take 81 mg by mouth daily.   atorvastatin (LIPITOR) 20 MG tablet TAKE  1 TABLET(20 MG) BY MOUTH DAILY AT 6 PM   carvedilol (COREG) 12.5 MG tablet TAKE 1 TABLET(12.5 MG) BY MOUTH TWICE DAILY   furosemide (LASIX) 20 MG tablet Take 1 tablet (20 mg total) by mouth daily.   levothyroxine (SYNTHROID) 50 MCG tablet TAKE 1 TABLET BY MOUTH EVERY DAY AS DIRECTED   lipase/protease/amylase (CREON) 36000 UNITS CPEP capsule TAKE 1 CAPSULE BY MOUTH THREE TIMES DAILY WITH MEALS   nitroGLYCERIN (NITROSTAT) 0.4 MG SL tablet Place 0.4 mg under the tongue every 5 (five) minutes as needed.   omeprazole (PRILOSEC) 20 MG capsule TAKE 2 CAPSULES(40  MG) BY MOUTH DAILY   tamsulosin (FLOMAX) 0.4 MG CAPS capsule TAKE 1 CAPSULE(0.4 MG) BY MOUTH DAILY   Thiamine HCl (B-1) 100 MG TABS TAKE 1 TABLET BY MOUTH EVERY DAY     Allergies:   Imodium [loperamide] and Hydroxyzine hcl   Social History   Socioeconomic History   Marital status: Married    Spouse name: Not on file   Number of children: 1   Years of education: Not on file   Highest education level: Not on file  Occupational History   Not on file  Tobacco Use   Smoking status: Never   Smokeless tobacco: Never  Vaping Use   Vaping Use: Never used  Substance and Sexual Activity   Alcohol use: No   Drug use: No   Sexual activity: Not on file  Other Topics Concern   Not on file  Social History Narrative   Not on file   Social Determinants of Health   Financial Resource Strain: Low Risk  (04/19/2022)   Overall Financial Resource Strain (CARDIA)    Difficulty of Paying Living Expenses: Not hard at all  Food Insecurity: No Food Insecurity (04/19/2022)   Hunger Vital Sign    Worried About Running Out of Food in the Last Year: Never true    Ran Out of Food in the Last Year: Never true  Transportation Needs: No Transportation Needs (04/19/2022)   PRAPARE - Hydrologist (Medical): No    Lack of Transportation (Non-Medical): No  Physical Activity: Sufficiently Active (04/19/2022)   Exercise Vital Sign    Days of Exercise per Week: 7 days    Minutes of Exercise per Session: 60 min  Stress: No Stress Concern Present (04/19/2022)   [place]    Feeling of Stress : Only a little  Social Connections: Moderately Isolated (04/19/2022)   Social Connection and Isolation Panel [NHANES]    Frequency of Communication with Friends and Family: More than three times a week    Frequency of Social Gatherings with Friends and Family: Three times a week    Attends Religious Services: Never    Active  Member of Clubs or Organizations: No    Attends Archivist Meetings: Never    Marital Status: Married     Family History: The patient's Family history is unknown by patient.  ROS:   Please see the history of present illness.     All other systems reviewed and are negative.  EKGs/Labs/Other Studies Reviewed:    The following studies were reviewed today:  Echo 10/09/21  1. Left ventricular ejection fraction, by estimation, is 50 to 55%. The  left ventricle has low normal function. The left ventricle has no regional  wall motion abnormalities. The left ventricular internal cavity size was  mildly dilated. There is mild  left  ventricular hypertrophy. Left ventricular diastolic parameters are  consistent with Grade I diastolic dysfunction (impaired relaxation).   2. Right ventricular systolic function is normal. The right ventricular  size is normal. There is normal pulmonary artery systolic pressure. The  estimated right ventricular systolic pressure is 0000000 mmHg.   3. Left atrial size was mildly dilated.   4. The mitral valve is normal in structure. Mild mitral valve  regurgitation. No evidence of mitral stenosis.   5. The aortic valve is tricuspid. Aortic valve regurgitation is moderate.  Aortic valve sclerosis is present, with no evidence of aortic valve  stenosis.   6. The inferior vena cava is normal in size with greater than 50%  respiratory variability, suggesting right atrial pressure of 3 mmHg.   7. There is mild dilatation of the aortic root, measuring 41 mm. There is  mild dilatation of the ascending aorta, measuring 41 mm.   8. Frequent PVCs   Comparison(s): EF 70-75%; Aorta 4.1 cm.    Echo 02/03/21  1. Left ventricular ejection fraction, by estimation, is 70 to 75%. The  left ventricle has hyperdynamic function. The left ventricle has no  regional wall motion abnormalities. Left ventricular diastolic parameters  were normal.   2. Right ventricular systolic  function is normal. The right ventricular  size is mildly enlarged. Tricuspid regurgitation signal is inadequate for  assessing PA pressure.   3. The mitral valve is grossly normal. No evidence of mitral valve  regurgitation. No evidence of mitral stenosis.   4. The aortic valve is tricuspid. Aortic valve regurgitation is mild.  Mild aortic valve sclerosis is present, with no evidence of aortic valve  stenosis.   5. Aortic dilatation noted. There is mild dilatation of the aortic root,  measuring 41 mm.    Cardiac cath 2017 Left Heart Cath And Coronary Angiography    Conclusion   Prox LAD lesion, 30 %stenosed.  EKG:  EKG is not ordered today.    Recent Labs: 05/20/2022: ALT 13; BUN 18; Creatinine, Ser 1.19; Hemoglobin 12.6; Platelets 174; Potassium 4.3; Sodium 145; TSH 5.690  Recent Lipid Panel    Component Value Date/Time   CHOL 99 (L) 05/20/2022 1002   TRIG 80 05/20/2022 1002   HDL 44 05/20/2022 1002   CHOLHDL 2.3 05/20/2022 1002   CHOLHDL 2.6 02/01/2021 1849   VLDL 14 02/01/2021 1849   LDLCALC 39 05/20/2022 1002   LDLCALC 58 06/20/2017 0959     Physical Exam:    VS:  BP 132/78   Pulse 88   Ht 5' 10"$  (1.778 m)   Wt 185 lb (83.9 kg)   SpO2 96%   BMI 26.54 kg/m     Wt Readings from Last 3 Encounters:  09/21/22 185 lb (83.9 kg)  05/20/22 164 lb (74.4 kg)  05/17/22 164 lb (74.4 kg)     GEN:  Well nourished, well developed in no acute distress HEENT: Normal NECK: No JVD; No carotid bruits LYMPHATICS: No lymphadenopathy CARDIAC: RRR, no murmurs, rubs, gallops RESPIRATORY:  Clear to auscultation without rales, wheezing or rhonchi  ABDOMEN: Soft, non-tender, non-distended MUSCULOSKELETAL:  No edema; No deformity  SKIN: Warm and dry NEUROLOGIC:  Alert and oriented x 3 PSYCHIATRIC:  Normal affect   ASSESSMENT:    1. Chronic diastolic heart failure ([place])   2. Coronary artery disease involving native coronary artery of native heart without angina pectoris   3.  Hyperlipidemia, mixed   4. Essential hypertension   5. Aortic valve insufficiency, etiology  of cardiac valve disease unspecified    PLAN:    In order of problems listed above:  Chronic diastolic heart failure The patient is euvolemic on exam today. Echo from 10/2021 showed LVEF 50-55%, no WMA, mild LVH, G1DD, normal pulmonary artery systolic pressure, mild MR, moderate AI, mild aortic root dilation measuring 69m. Continue lasix 224mdaily and Coreg.   CAD The patient denies chest pain, but does have DOE at times. No plan for further ischemic work-up at this time. Continue Aspirin, Liptior, and Coreg.   HLD LDL 39, continue Lipitor 2019maily.   HTN BP is good today, continue current medications.   Aortic Insufficiency Moderate AI on echo in 04/2022. Can follow with serial echocardiograms.   Disposition: Follow up in 6 month(s) with MD/APP     Signed, Mikaia Janvier H FNinfa MeekerA-C  09/21/2022 11:19 AM     [facility]

## 2022-09-21 ENCOUNTER — Ambulatory Visit: Payer: Medicare Other | Attending: Medical | Admitting: Medical

## 2022-09-21 ENCOUNTER — Encounter: Payer: Self-pay | Admitting: Medical

## 2022-09-21 VITALS — BP 132/78 | HR 88 | Ht 70.0 in | Wt 185.0 lb

## 2022-09-21 DIAGNOSIS — I5032 Chronic diastolic (congestive) heart failure: Secondary | ICD-10-CM

## 2022-09-21 DIAGNOSIS — I2510 Atherosclerotic heart disease of native coronary artery without angina pectoris: Secondary | ICD-10-CM

## 2022-09-21 DIAGNOSIS — I10 Essential (primary) hypertension: Secondary | ICD-10-CM | POA: Diagnosis not present

## 2022-09-21 DIAGNOSIS — I351 Nonrheumatic aortic (valve) insufficiency: Secondary | ICD-10-CM

## 2022-09-21 DIAGNOSIS — E782 Mixed hyperlipidemia: Secondary | ICD-10-CM

## 2022-09-21 NOTE — Patient Instructions (Signed)
Medication Instructions:   Your physician recommends that you continue on your current medications as directed. Please refer to the Current Medication list given to you today.  *If you need a refill on your cardiac medications before your next appointment, please call your pharmacy*   Lab Work:  NONE  If you have labs (blood work) drawn today and your tests are completely normal, you will receive your results only by: [place] (if you have MyChart) OR A paper copy in the mail If you have any lab test that is abnormal or we need to change your treatment, we will call you to review the results.   Testing/Procedures:  NONE   Follow-Up: At [facility], you and your health needs are our priority.  As part of our continuing mission to provide you with exceptional heart care, we have created designated Provider Care Teams.  These Care Teams include your primary Cardiologist (physician) and Advanced Practice Providers (APPs -  Physician Assistants and Nurse Practitioners) who all work together to provide you with the care you need, when you need it.  We recommend signing up for the patient portal called "MyChart".  Sign up information is provided on this After Visit Summary.  MyChart is used to connect with patients for Virtual Visits (Telemedicine).  Patients are able to view lab/test results, encounter notes, upcoming appointments, etc.  Non-urgent messages can be sent to your provider as well.   To learn more about what you can do with MyChart, go to [email or web address].    Your next appointment:   6 month(s)  Provider:   [name], MD

## 2022-09-29 ENCOUNTER — Other Ambulatory Visit: Payer: Self-pay | Admitting: Family Medicine

## 2022-09-30 ENCOUNTER — Telehealth: Payer: Self-pay

## 2022-09-30 ENCOUNTER — Other Ambulatory Visit: Payer: Self-pay

## 2022-09-30 ENCOUNTER — Other Ambulatory Visit: Payer: Self-pay | Admitting: Cardiovascular Disease

## 2022-09-30 ENCOUNTER — Telehealth: Payer: Self-pay | Admitting: Family Medicine

## 2022-09-30 IMAGING — US US ABDOMEN LIMITED
1 series · 15 of 25 positions shown · non-contrast
Comparison: MRI dated February 08, 2021.  CT scan dated February 06, 2021.

CLINICAL DATA: Gallstones.

EXAM:
ULTRASOUND ABDOMEN LIMITED RIGHT UPPER QUADRANT

[Series 1: us abdomen limited ruq · 15 of 59 slices shown]
[im 1/59]
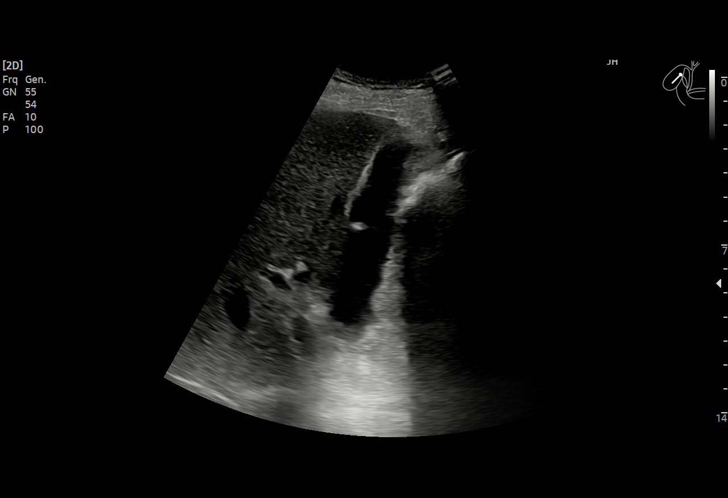
[im 5/59]
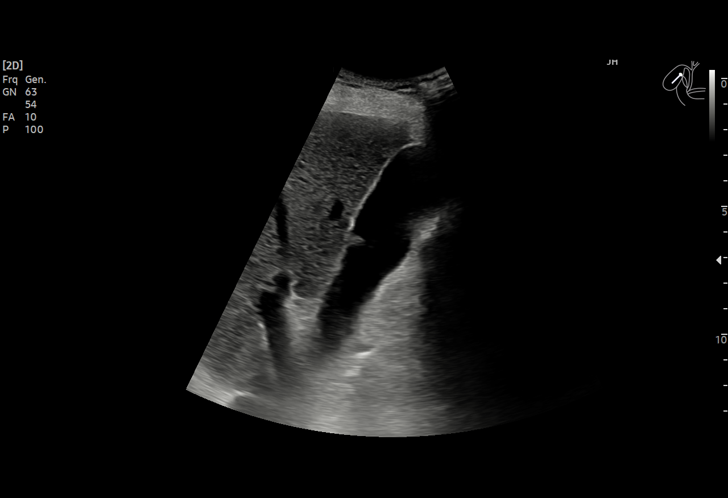
[im 10/59]
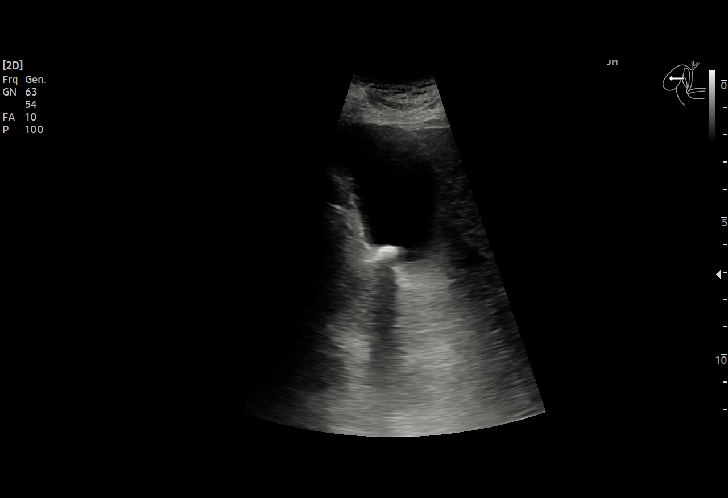
[im 13/59]
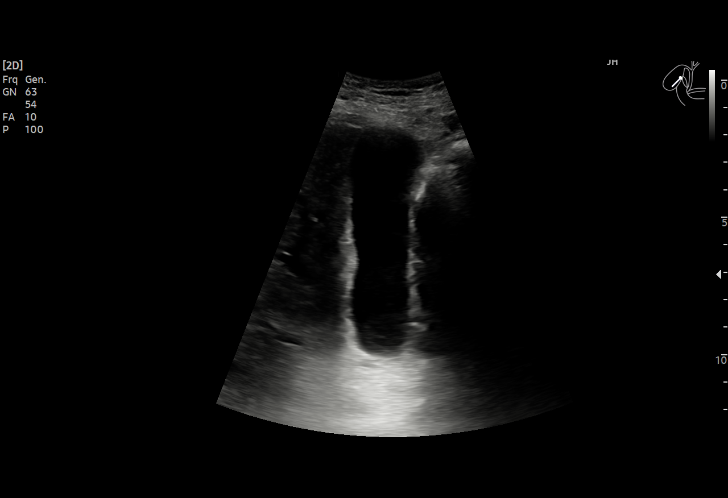
[im 17/59]
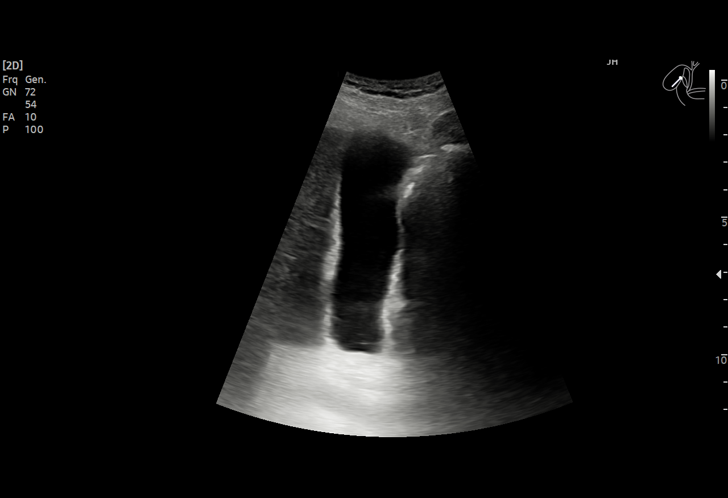
[im 22/59]
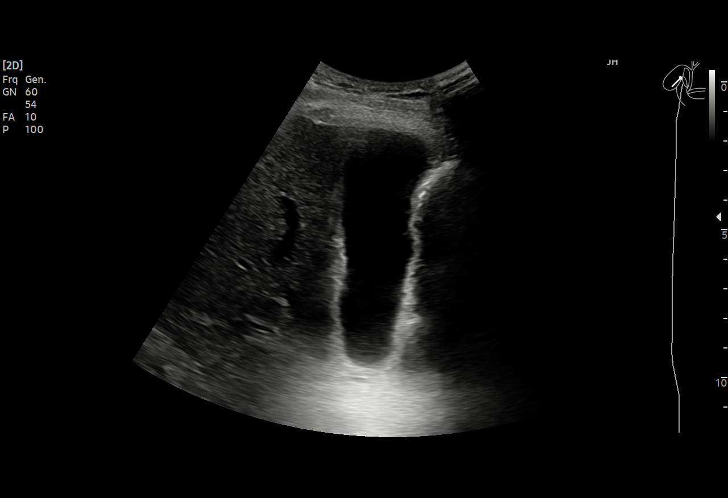
[im 25/59]
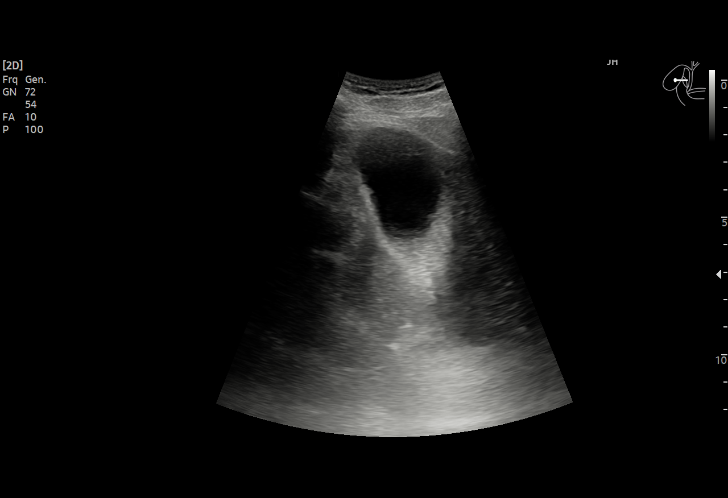
[im 30/59]
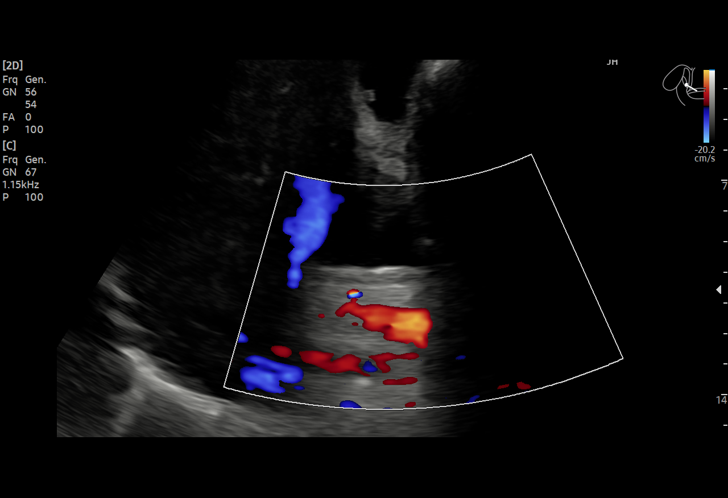
[im 34/59]
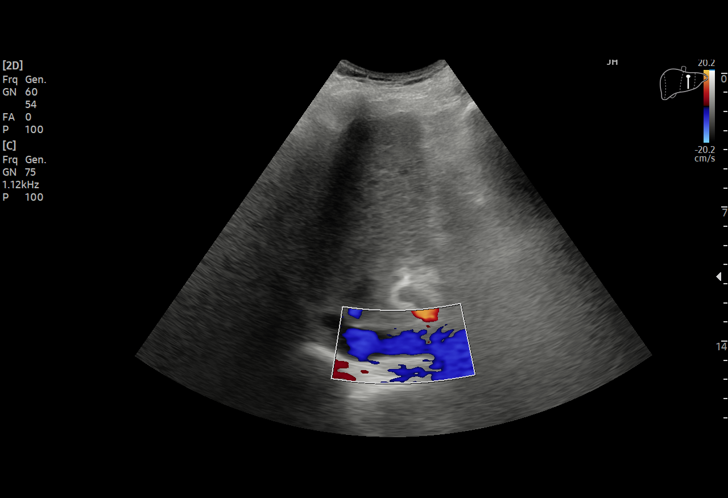
[im 37/59]
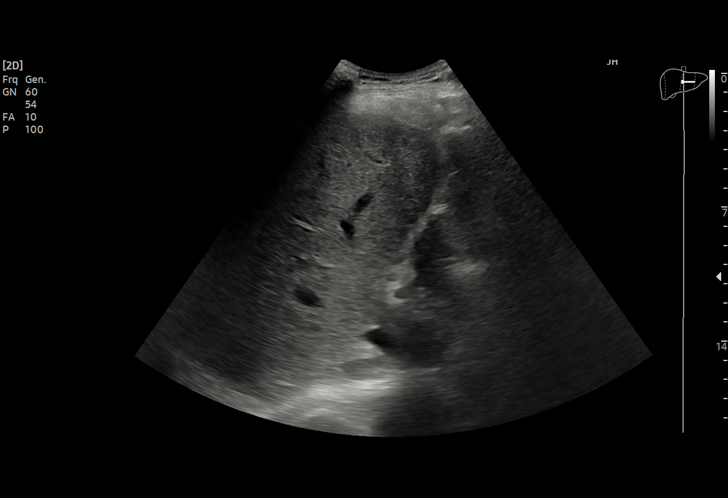
[im 42/59]
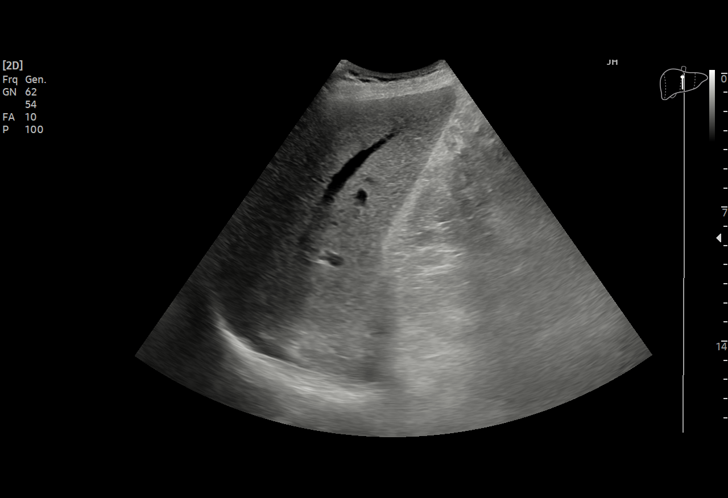
[im 46/59]
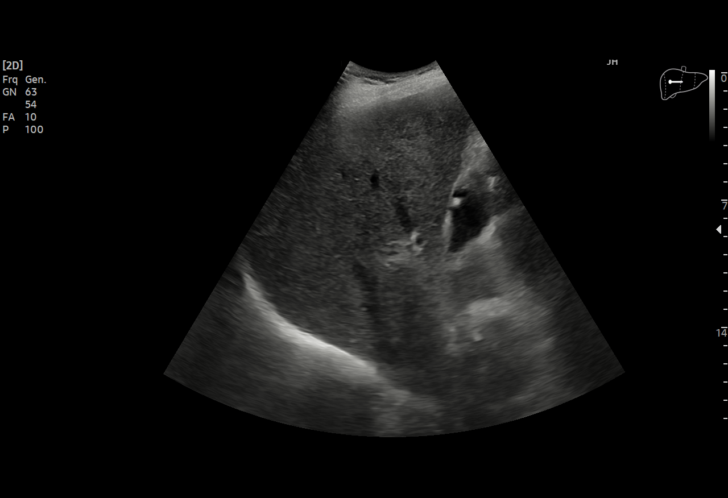
[im 49/59]
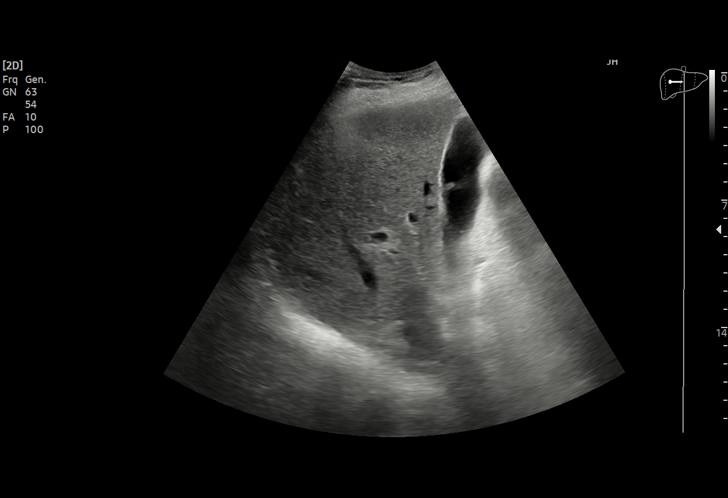
[im 54/59]
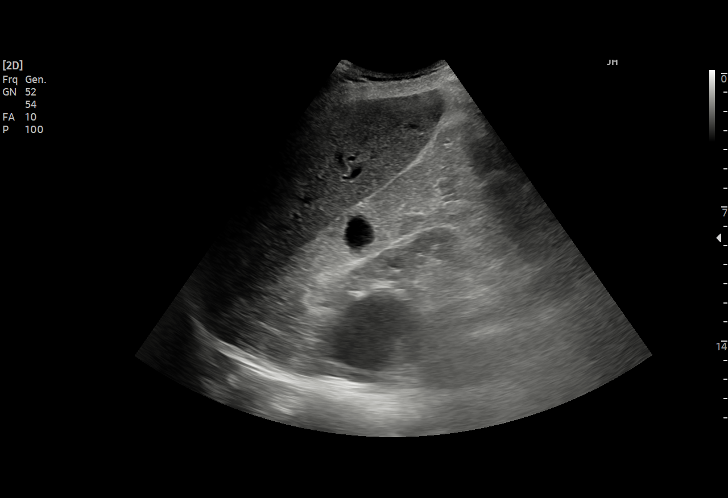
[im 59/59]
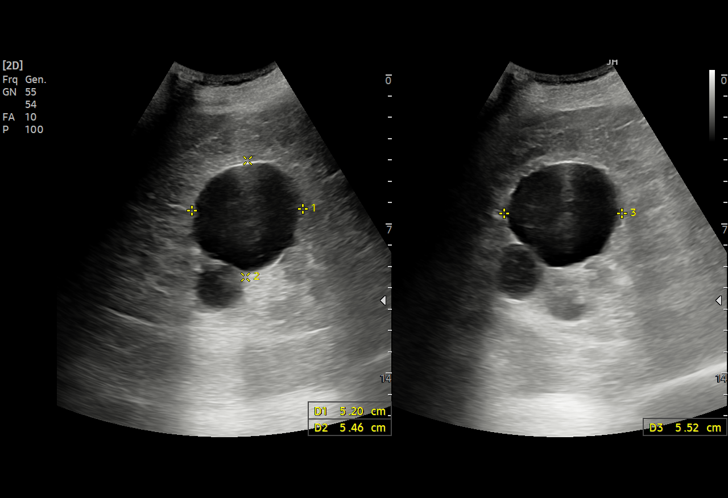

[15 of 25 positions shown; findings below may reference images not displayed]

FINDINGS: Gallbladder:

Cholelithiasis. The gallbladder wall measures 3.1 mm in thickness.
No Murphy's sign or pericholecystic fluid.

Common bile duct:

Diameter: 4.3 mm

Liver:

No focal lesion identified. Within normal limits in parenchymal
echogenicity. Portal vein is patent on color Doppler imaging with
normal direction of blood flow towards the liver.

Other: Multiple right renal cysts are identified with the largest
measuring 5.5 cm. No follow-up imaging is recommended of the cysts.
IMPRESSION: 1. Cholelithiasis. The gallbladder wall is borderline in thickness
measuring 3.1 mm. No Murphy's sign or pericholecystic fluid.
2. The common bile duct is normal in caliber.
3. Evaluation of the liver somewhat limited due to patient body
habitus. No abnormalities are identified.

## 2022-09-30 MED ORDER — B-1 100 MG PO TABS: 1.0000 | ORAL_TABLET | Freq: Every day | ORAL | 3 refills | Status: AC

## 2022-09-30 NOTE — Telephone Encounter (Signed)
Refilled

## 2022-09-30 NOTE — Telephone Encounter (Signed)
[facility] faxed refill request for the following medications:  Vitamin B-1 119m tablets   Please advise.

## 2022-09-30 NOTE — Telephone Encounter (Signed)
Copied from [place] [phone]. Topic: General - Other >> Sep 30, 2022 11:56 AM Cyndi Bender wrote: Reason for CRM: Pt wife Lesleigh Noe stated Dina Rich will be faxing Tally Joe paperwork to sign so pt Rx can be filled.

## 2022-10-01 ENCOUNTER — Other Ambulatory Visit: Payer: Self-pay | Admitting: Family Medicine

## 2022-10-02 ENCOUNTER — Other Ambulatory Visit: Payer: Self-pay | Admitting: Family Medicine

## 2022-10-02 DIAGNOSIS — I10 Essential (primary) hypertension: Secondary | ICD-10-CM

## 2022-10-03 ENCOUNTER — Other Ambulatory Visit: Payer: Self-pay | Admitting: Family Medicine

## 2022-10-03 DIAGNOSIS — I10 Essential (primary) hypertension: Secondary | ICD-10-CM

## 2022-10-06 ENCOUNTER — Other Ambulatory Visit: Payer: Self-pay | Admitting: Family Medicine

## 2022-10-06 DIAGNOSIS — E7800 Pure hypercholesterolemia, unspecified: Secondary | ICD-10-CM

## 2022-10-07 ENCOUNTER — Other Ambulatory Visit: Payer: Self-pay | Admitting: Family Medicine

## 2022-10-07 DIAGNOSIS — I10 Essential (primary) hypertension: Secondary | ICD-10-CM

## 2022-10-29 ENCOUNTER — Other Ambulatory Visit: Payer: Self-pay | Admitting: Family Medicine

## 2022-10-29 DIAGNOSIS — K219 Gastro-esophageal reflux disease without esophagitis: Secondary | ICD-10-CM

## 2022-10-29 NOTE — Telephone Encounter (Signed)
Requested medication (s) are due for refill today: yes  Requested medication (s) are on the active medication list: yes  Last refill:  07/02/22 #180 0 refills  Future visit scheduled: yes in 3 weeks  Notes to clinic:  no refills remain. Do you want to refill Rx?     Requested Prescriptions  Pending Prescriptions Disp Refills   omeprazole (PRILOSEC) 20 MG capsule [Pharmacy Med Name: OMEPRAZOLE 20MG  CAPSULES] 180 capsule 0    Sig: TAKE 2 CAPSULES(40 MG) BY MOUTH DAILY     Gastroenterology: Proton Pump Inhibitors Passed - 10/29/2022  9:35 AM      Passed - Valid encounter within last 12 months    Recent Outpatient Visits           5 months ago Annual physical exam   [facility] Tally Joe T, [name]   10 months ago Aortic valve insufficiency, etiology of cardiac valve disease unspecified    [facility] [name], [name]   1 year ago Essential hypertension   [place] [name], [name]   1 year ago Essential hypertension   [place] Tally Joe T, [name]   1 year ago Hypotension due to hypovolemia   [facility] [name], [name]       Future Appointments             In 3 weeks [name], [place], [place]

## 2022-11-16 ENCOUNTER — Other Ambulatory Visit: Payer: Self-pay | Admitting: Family Medicine

## 2022-11-18 NOTE — Progress Notes (Signed)
I,J'ya E Hunter,acting as a scribe for Jacky KindleElise T Allante Beane, [name].,have documented all relevant documentation on the behalf of Jacky Kindlelise T Jayona Mccaig, [name],as directed by  Jacky KindleElise T Pamelyn Bancroft, [name] while in the presence of Jacky KindleElise T Ka Bench, [name].   Established patient visit   Patient: James Mora   DOB: 10/30/1935   87 y.o. Male  MRN: [id number] Visit Date: 11/22/2022  Today's healthcare provider: Jacky KindleElise T Rhyann Berton, [name]  Re Introduced to nurse practitioner role and practice setting.  All questions answered.  Discussed provider/patient relationship and expectations.  Chief Complaint  Patient presents with   Follow-up    Hypertension   Subjective    HPI HPI     Follow-up    Additional comments: Hypertension      Last edited by Tama HeadingsHunter, J'Ya E, CMA on 11/22/2022  9:31 AM.      Hypertension, follow-up  BP Readings from Last 3 Encounters:  11/22/22 116/68  09/21/22 132/78  05/20/22 121/71   Wt Readings from Last 3 Encounters:  11/22/22 179 lb (81.2 kg)  09/21/22 185 lb (83.9 kg)  05/20/22 164 lb (74.4 kg)     He was last seen for hypertension 6 months ago.  BP at that visit was 119/77. Management since that visit includes amLODipine (NORVASC) 2.5 MG tablet.  He reports excellent compliance with treatment. He is not having side effects.  He is following a Regular diet. He is not exercising. He does not smoke.  Outside blood pressures are being recorded. Symptoms: No chest pain No chest pressure  No palpitations No syncope  Yes dyspnea Yes orthopnea  Yes paroxysmal nocturnal dyspnea Yes lower extremity edema   Pertinent labs Lab Results  Component Value Date   CHOL 99 (L) 05/20/2022   HDL 44 05/20/2022   LDLCALC 39 05/20/2022   TRIG 80 05/20/2022   CHOLHDL 2.3 05/20/2022   Lab Results  Component Value Date   NA 145 (H) 05/20/2022   K 4.3 05/20/2022   CREATININE 1.19 05/20/2022   EGFR 60 05/20/2022   GLUCOSE 101 (H) 05/20/2022   TSH 5.690 (H) 05/20/2022     The ASCVD Risk  score (Arnett DK, et al., 2019) failed to calculate for the following reasons:   The 2019 ASCVD risk score is only valid for ages 3540 to 6779  ---------------------------------------------------------------------------------------------------   Medications: Outpatient Medications Prior to Visit  Medication Sig   acetaminophen (TYLENOL) 500 MG tablet Take 500 mg by mouth every 6 (six) hours as needed.   amLODipine (NORVASC) 2.5 MG tablet TAKE 1 TABLET(2.5 MG) BY MOUTH DAILY   aspirin 81 MG tablet Take 81 mg by mouth daily.   atorvastatin (LIPITOR) 20 MG tablet TAKE 1 TABLET(20 MG) BY MOUTH DAILY AT 6 PM   carvedilol (COREG) 12.5 MG tablet TAKE 1 TABLET(12.5 MG) BY MOUTH TWICE DAILY   furosemide (LASIX) 20 MG tablet TAKE 1 TABLET(20 MG) BY MOUTH DAILY   levothyroxine (SYNTHROID) 50 MCG tablet TAKE 1 TABLET BY MOUTH EVERY DAY AS DIRECTED   lipase/protease/amylase (CREON) 36000 UNITS CPEP capsule TAKE 1 CAPSULE BY MOUTH THREE TIMES DAILY WITH MEALS   nitroGLYCERIN (NITROSTAT) 0.4 MG SL tablet Place 0.4 mg under the tongue every 5 (five) minutes as needed.   tamsulosin (FLOMAX) 0.4 MG CAPS capsule TAKE 1 CAPSULE(0.4 MG) BY MOUTH DAILY   Thiamine HCl (B-1) 100 MG TABS Take 1 tablet (100 mg total) by mouth daily.   [DISCONTINUED] omeprazole (PRILOSEC) 20 MG capsule TAKE 2 CAPSULES(40 MG)  BY MOUTH DAILY   No facility-administered medications prior to visit.    Review of Systems    Objective    BP 116/68 (BP Location: Right Arm, Patient Position: Sitting, Cuff Size: Large)   Pulse 90 Comment: L radial  Temp 98.1 F (36.7 C) (Oral)   Ht 5\' 8"  (1.727 m)   Wt 179 lb (81.2 kg)   SpO2 97%   BMI 27.22 kg/m   Physical Exam Vitals and nursing note reviewed.  Constitutional:      Appearance: Normal appearance. He is overweight.  HENT:     Head: Normocephalic and atraumatic.  Eyes:     Pupils: Pupils are equal, round, and reactive to light.  Cardiovascular:     Rate and Rhythm: Normal rate  and regular rhythm.     Pulses: Normal pulses.     Heart sounds: Normal heart sounds.  Pulmonary:     Effort: Pulmonary effort is normal.     Breath sounds: Normal breath sounds.  Musculoskeletal:        General: Normal range of motion.     Cervical back: Normal range of motion.     Right lower leg: No edema.     Left lower leg: No edema.  Skin:    General: Skin is warm and dry.     Capillary Refill: Capillary refill takes less than 2 seconds.  Neurological:     General: No focal deficit present.     Mental Status: He is alert and oriented to person, place, and time. Mental status is at baseline.  Psychiatric:        Mood and Affect: Mood normal.        Behavior: Behavior normal.        Thought Content: Thought content normal.        Judgment: Judgment normal.      No results found for any visits on 11/22/22.  Assessment & Plan     Problem List Items Addressed This Visit       Cardiovascular and Mediastinum   Coronary artery disease involving native coronary artery of native heart with angina pectoris with documented spasm    Chronic, without c/o CP or chest pressure Continues to walk daily around home; however, presents in w/c today  BP remains stable HR remains stable Patient continues to monitor diet to address heart health Continue medications as previously prescribed - lipitor 20 mg - asa 81 mg - coreg 12.5 mg       Essential hypertension    Chronic, stable Continue norvasc at 2.5 mg with use of coreg 12.5 mg BID BP gpal pf <129/<79 given know CAD and pre-diabetes       Relevant Orders   CBC   Basic Metabolic Panel (BMET)   Varicose veins of right lower extremity with ulcer other part of foot    Chronic, stable; historical ulceration No edema on exam today Continue to be mindful of salt intake, water intake and exercise No concerns for abrasions or irritation/ulceration at this time Continue to keep feet moisturized to assist with prevention of further  skin complications         Digestive   Esophagitis, reflux    Chronic, stable Wishes to continue 2-20 priolsec to assist       Gastroesophageal reflux disease - Primary   Relevant Medications   omeprazole (PRILOSEC) 20 MG capsule     Other   Elevated TSH    Repeat Thyroid panel today given previous medication  change to 50 mcg synthroid Patient's wife reports chronic insomnia- waking from 4-6 am and poor energy levels, however, known CAD and use of BB      Relevant Orders   TSH + free T4   Insomnia    Chronic, stable Patient notes +PHQ9 as a result; continues to defer medications to assist Previously discussed sleep hygiene, mood relation and working to wear himself out physically with daily exercise/activity to promote higher sleep quality Continue to monitor       Iron deficiency anemia secondary to inadequate dietary iron intake    Chronic, previously stable without pallor or bruising Repeat labs at this time Remains on thiamine to assist; continue to recommend balanced diet for other micronutrient needs       Relevant Orders   CBC   Prediabetes    Chronic, stable Continue to recommend balanced, lower carb meals. Smaller meal size, adding snacks. Choosing water as drink of choice and increasing purposeful exercise. Repeat A1c      Relevant Orders   Hemoglobin A1c   Return in about 6 months (around 05/24/2023) for chonic disease management.     Leilani Merl, [name], have reviewed all documentation for this visit. The documentation on 11/22/22 for the exam, diagnosis, procedures, and orders are all accurate and complete.  Jacky Kindle, [name]  [facility] [phone] (phone) [phone] (fax)  [facility]

## 2022-11-22 ENCOUNTER — Encounter: Payer: Self-pay | Admitting: Family Medicine

## 2022-11-22 ENCOUNTER — Ambulatory Visit (INDEPENDENT_AMBULATORY_CARE_PROVIDER_SITE_OTHER): Payer: Medicare Other | Admitting: Family Medicine

## 2022-11-22 VITALS — BP 116/68 | HR 90 | Temp 98.1°F | Ht 68.0 in | Wt 179.0 lb

## 2022-11-22 DIAGNOSIS — R7989 Other specified abnormal findings of blood chemistry: Secondary | ICD-10-CM | POA: Diagnosis not present

## 2022-11-22 DIAGNOSIS — F5101 Primary insomnia: Secondary | ICD-10-CM

## 2022-11-22 DIAGNOSIS — I10 Essential (primary) hypertension: Secondary | ICD-10-CM | POA: Diagnosis not present

## 2022-11-22 DIAGNOSIS — K2100 Gastro-esophageal reflux disease with esophagitis, without bleeding: Secondary | ICD-10-CM | POA: Diagnosis not present

## 2022-11-22 DIAGNOSIS — R7303 Prediabetes: Secondary | ICD-10-CM

## 2022-11-22 DIAGNOSIS — I25111 Atherosclerotic heart disease of native coronary artery with angina pectoris with documented spasm: Secondary | ICD-10-CM

## 2022-11-22 DIAGNOSIS — I83015 Varicose veins of right lower extremity with ulcer other part of foot: Secondary | ICD-10-CM

## 2022-11-22 DIAGNOSIS — D508 Other iron deficiency anemias: Secondary | ICD-10-CM

## 2022-11-22 DIAGNOSIS — L97519 Non-pressure chronic ulcer of other part of right foot with unspecified severity: Secondary | ICD-10-CM

## 2022-11-22 MED ORDER — OMEPRAZOLE 20 MG PO CPDR: DELAYED_RELEASE_CAPSULE | ORAL | 3 refills | Status: DC

## 2022-11-22 NOTE — Assessment & Plan Note (Signed)
Chronic, stable Continue norvasc at 2.5 mg with use of coreg 12.5 mg BID BP gpal pf <129/<79 given know CAD and pre-diabetes

## 2022-11-22 NOTE — Assessment & Plan Note (Signed)
Chronic, previously stable without pallor or bruising Repeat labs at this time Remains on thiamine to assist; continue to recommend balanced diet for other micronutrient needs

## 2022-11-22 NOTE — Patient Instructions (Signed)
Seek vaccines at your local pharmacy The CDC recommends two doses of Shingrix (the new shingles vaccine) separated by 2 to 6 months for adults age 85 years and older. I recommend checking with your insurance plan regarding coverage for this vaccine.   -pneumonia

## 2022-11-22 NOTE — Assessment & Plan Note (Signed)
Chronic, without c/o CP or chest pressure Continues to walk daily around home; however, presents in w/c today  BP remains stable HR remains stable Patient continues to monitor diet to address heart health Continue medications as previously prescribed - lipitor 20 mg - asa 81 mg - coreg 12.5 mg

## 2022-11-22 NOTE — Assessment & Plan Note (Signed)
Chronic, stable ?Continue to recommend balanced, lower carb meals. Smaller meal size, adding snacks. Choosing water as drink of choice and increasing purposeful exercise. ?Repeat A1c ? ?

## 2022-11-22 NOTE — Assessment & Plan Note (Signed)
Chronic, stable; historical ulceration No edema on exam today Continue to be mindful of salt intake, water intake and exercise No concerns for abrasions or irritation/ulceration at this time Continue to keep feet moisturized to assist with prevention of further skin complications

## 2022-11-22 NOTE — Assessment & Plan Note (Signed)
Chronic, stable Patient notes +PHQ9 as a result; continues to defer medications to assist Previously discussed sleep hygiene, mood relation and working to wear himself out physically with daily exercise/activity to promote higher sleep quality Continue to monitor

## 2022-11-22 NOTE — Assessment & Plan Note (Signed)
Chronic, stable Wishes to continue 2-20 priolsec to assist

## 2022-11-22 NOTE — Assessment & Plan Note (Signed)
Repeat Thyroid panel today given previous medication change to 50 mcg synthroid Patient's wife reports chronic insomnia- waking from 4-6 am and poor energy levels, however, known CAD and use of BB

## 2022-11-23 LAB — CBC
Hematocrit: 41.3 % (ref 37.5–51.0)
Hemoglobin: 13.4 g/dL (ref 13.0–17.7)
MCH: 29.9 pg (ref 26.6–33.0)
MCHC: 32.4 g/dL (ref 31.5–35.7)
MCV: 92 fL (ref 79–97)
Platelets: 182 10*3/uL (ref 150–450)
RBC: 4.48 x10E6/uL (ref 4.14–5.80)
RDW: 13.6 % (ref 11.6–15.4)
WBC: 5.4 10*3/uL (ref 3.4–10.8)

## 2022-11-23 LAB — BASIC METABOLIC PANEL
BUN/Creatinine Ratio: 15 (ref 10–24)
BUN: 21 mg/dL (ref 8–27)
CO2: 21 mmol/L (ref 20–29)
Calcium: 9 mg/dL (ref 8.6–10.2)
Chloride: 109 mmol/L — ABNORMAL HIGH (ref 96–106)
Creatinine, Ser: 1.43 mg/dL — ABNORMAL HIGH (ref 0.76–1.27)
Glucose: 116 mg/dL — ABNORMAL HIGH (ref 70–99)
Potassium: 4.5 mmol/L (ref 3.5–5.2)
Sodium: 143 mmol/L (ref 134–144)
eGFR: 48 mL/min/{1.73_m2} — ABNORMAL LOW (ref >59)

## 2022-11-23 LAB — HEMOGLOBIN A1C
Est. average glucose Bld gHb Est-mCnc: 134 mg/dL
Hgb A1c MFr Bld: 6.3 % — ABNORMAL HIGH (ref 4.8–5.6)

## 2022-11-23 LAB — TSH+FREE T4
Free T4: 1.21 ng/dL (ref 0.82–1.77)
TSH: 3.1 u[IU]/mL (ref 0.450–4.500)

## 2022-11-23 NOTE — Progress Notes (Signed)
Increase in creatinine from 1.19 to 1.43. Continue to use 48 oz water/day to assist kidneys, avoiding caffeine containing beverages or alcohol. Recommend repeat labs in 3 months. If desired, can see kidney specialist at this time. Order not placed.  A1c has increased; remains stable in pre-diabetic range. Continue to recommend balanced, lower carb meals. Smaller meal size, adding snacks. Choosing water as drink of choice and increasing purposeful exercise.  Anemia and thyroid stable.

## 2022-11-24 ENCOUNTER — Other Ambulatory Visit: Payer: Self-pay | Admitting: *Deleted

## 2022-11-24 MED ORDER — FUROSEMIDE 20 MG PO TABS: ORAL_TABLET | ORAL | 0 refills | Status: DC

## 2023-01-23 ENCOUNTER — Other Ambulatory Visit: Payer: Self-pay | Admitting: Family Medicine

## 2023-01-23 DIAGNOSIS — K2100 Gastro-esophageal reflux disease with esophagitis, without bleeding: Secondary | ICD-10-CM

## 2023-02-12 ENCOUNTER — Other Ambulatory Visit: Payer: Self-pay | Admitting: Family Medicine

## 2023-02-14 NOTE — Telephone Encounter (Signed)
Requested Prescriptions  Pending Prescriptions Disp Refills   levothyroxine (SYNTHROID) 50 MCG tablet [Pharmacy Med Name: LEVOTHYROXINE 0.05MG  ( ) TAB] 90 tablet 0    Sig: TAKE 1 TABLET BY MOUTH EVERY DAY AS DIRECTED     Endocrinology:  Hypothyroid Agents Passed - 02/12/2023  3:30 AM      Passed - TSH in normal range and within 360 days    TSH  Date Value Ref Range Status  11/22/2022 3.100 0.450 - 4.500 uIU/mL Final         Passed - Valid encounter within last 12 months    Recent Outpatient Visits           2 months ago Gastroesophageal reflux disease with esophagitis without hemorrhage   [place] [facility] Jacky Kindle, [name]   9 months ago Annual physical exam   [facility] Merita Norton T, [name]   1 year ago Aortic valve insufficiency, etiology of cardiac valve disease unspecified   [place] [facility] Jacky Kindle, [name]   1 year ago Essential hypertension   [place] [facility] Jacky Kindle, [name]   1 year ago Essential hypertension   [place] [facility] Jacky Kindle, [name]       Future Appointments             In 3 months Jacky Kindle, [name] [facility]

## 2023-02-19 ENCOUNTER — Other Ambulatory Visit: Payer: Self-pay | Admitting: Medical

## 2023-03-26 ENCOUNTER — Other Ambulatory Visit: Payer: Self-pay | Admitting: Family Medicine

## 2023-03-28 NOTE — Telephone Encounter (Signed)
Requested Prescriptions  Pending Prescriptions Disp Refills   tamsulosin (FLOMAX) 0.4 MG CAPS capsule [Pharmacy Med Name: TAMSULOSIN 0.4MG  CAPSULES] 90 capsule 0    Sig: TAKE 1 CAPSULE(0.4 MG) BY MOUTH DAILY     Urology: Alpha-Adrenergic Blocker Failed - 03/26/2023  3:31 AM      Failed - PSA in normal range and within 360 days    PSA  Date Value Ref Range Status  06/20/2017 3.7 < OR = 4.0 ng/mL Final    Comment:    The total PSA value from this assay system is  standardized against the WHO standard. The test  result will be approximately 20% lower when compared  to the equimolar-standardized total PSA (Beckman  Coulter). Comparison of serial PSA results should be  interpreted with this fact in mind. . This test was performed using the Siemens  chemiluminescent method. Values obtained from  different assay methods cannot be used interchangeably. PSA levels, regardless of value, should not be interpreted as absolute evidence of the presence or absence of disease.    Prostate Specific Ag, Serum  Date Value Ref Range Status  09/08/2021 5.4 (H) 0.0 - 4.0 ng/mL Final    Comment:    Roche ECLIA methodology. According to the American Urological Association, Serum PSA should decrease and remain at undetectable levels after radical prostatectomy. The AUA defines biochemical recurrence as an initial PSA value 0.2 ng/mL or greater followed by a subsequent confirmatory PSA value 0.2 ng/mL or greater. Values obtained with different assay methods or kits cannot be used interchangeably. Results cannot be interpreted as absolute evidence of the presence or absence of malignant disease.          Passed - Last BP in normal range    BP Readings from Last 1 Encounters:  11/22/22 116/68         Passed - Valid encounter within last 12 months    Recent Outpatient Visits           4 months ago Gastroesophageal reflux disease with esophagitis without hemorrhage   [place] [facility] Jacky Kindle, [name]   10 months ago Annual physical exam   [facility] Merita Norton T, [name]   1 year ago Aortic valve insufficiency, etiology of cardiac valve disease unspecified   [place] [facility] Jacky Kindle, [name]   1 year ago Essential hypertension   [place] Jacky Kindle, [name]   1 year ago Essential hypertension   [place] [facility] Jacky Kindle, [name]       Future Appointments             In 1 month Jacky Kindle, [name] [facility], [facility]

## 2023-03-30 ENCOUNTER — Telehealth: Payer: Self-pay | Admitting: Family Medicine

## 2023-03-30 ENCOUNTER — Ambulatory Visit (INDEPENDENT_AMBULATORY_CARE_PROVIDER_SITE_OTHER): Payer: Medicare Other

## 2023-03-30 VITALS — Ht 70.0 in | Wt 179.0 lb

## 2023-03-30 DIAGNOSIS — Z0000 Encounter for general adult medical examination without abnormal findings: Secondary | ICD-10-CM

## 2023-03-30 NOTE — Telephone Encounter (Signed)
Pt's wife called in for pt's [facility] appt. She says line dropped by accident when she was speaking with Steward Drone B. Pt hung up when I was trying to reach [place]. I called back, no answer

## 2023-03-30 NOTE — Progress Notes (Signed)
Subjective:   James Mora is a 87 y.o. male who presents for Medicare Annual/Subsequent preventive examination.  Visit Complete: Virtual  I connected with  [name] on 03/30/23 by a audio enabled telemedicine application and verified that I am speaking with the correct person using two identifiers.  Patient Location: Home  Provider Location: Office/Clinic  I discussed the limitations of evaluation and management by telemedicine. The patient expressed understanding and agreed to proceed.  Vital Signs: Unable to obtain new vitals due to this being a telehealth visit.  Patient Medicare AWV questionnaire was completed by the patient on (not done); I have confirmed that all information answered by patient is correct and no changes since this date.  Review of Systems    Cardiac Risk Factors include: advanced age (>94men, >22 women);dyslipidemia;male gender;hypertension    Objective:    Today's Vitals   03/30/23 1018  Weight: 179 lb (81.2 kg)  Height: 5\' 10"  (1.778 m)   Body mass index is 25.68 kg/m.     03/30/2023   10:43 AM 04/19/2022   10:08 AM 04/18/2021   10:49 AM 02/27/2021    2:14 AM 02/01/2021    4:45 PM 03/19/2016   10:03 PM 03/19/2016    5:02 PM  Advanced Directives  Does Patient Have a Medical Advance Directive? No No No No No No No  Would patient like information on creating a medical advance directive?  No - Patient declined No - Patient declined No - Patient declined No - Patient declined No - patient declined information No - patient declined information    Current Medications (verified) Outpatient Encounter Medications as of 03/30/2023  Medication Sig   acetaminophen (TYLENOL) 500 MG tablet Take 500 mg by mouth every 6 (six) hours as needed.   amLODipine (NORVASC) 2.5 MG tablet TAKE 1 TABLET(2.5 MG) BY MOUTH DAILY   aspirin 81 MG tablet Take 81 mg by mouth daily.   atorvastatin (LIPITOR) 20 MG tablet TAKE 1 TABLET(20 MG) BY MOUTH DAILY AT 6 PM    carvedilol (COREG) 12.5 MG tablet TAKE 1 TABLET(12.5 MG) BY MOUTH TWICE DAILY   furosemide (LASIX) 20 MG tablet TAKE 1 TABLET(20 MG) BY MOUTH DAILY   levothyroxine (SYNTHROID) 50 MCG tablet TAKE 1 TABLET BY MOUTH EVERY DAY AS DIRECTED   lipase/protease/amylase (CREON) 36000 UNITS CPEP capsule TAKE 1 CAPSULE BY MOUTH THREE TIMES DAILY WITH MEALS   nitroGLYCERIN (NITROSTAT) 0.4 MG SL tablet Place 0.4 mg under the tongue every 5 (five) minutes as needed.   omeprazole (PRILOSEC) 20 MG capsule TAKE 2 CAPSULES(40 MG) BY MOUTH DAILY   tamsulosin (FLOMAX) 0.4 MG CAPS capsule TAKE 1 CAPSULE(0.4 MG) BY MOUTH DAILY   Thiamine HCl (B-1) 100 MG TABS Take 1 tablet (100 mg total) by mouth daily.   No facility-administered encounter medications on file as of 03/30/2023.    Allergies (verified) Imodium [loperamide] and Hydroxyzine hcl   History: Past Medical History:  Diagnosis Date   Acid reflux    Aortic insufficiency    a. 03/2016 Echo: EF 55-60%, no rwma, Gr1 DD, mild to mod AI, mild TR.   CAD (coronary artery disease)    a. 2000 Cath: Severe distal LAD disease with left to left collaterals;  b. 2008 MV: non-ischemic; c. 03/2016 Cath: LM nl, LAD 30p, D1/D2 mod, nl, LCX nl, OM1/2 nl w/ L-->L collats to apical LAD, RCA dominant, nl. EF 55-65%.   Hypertensive heart disease 2005   unspecified   Mixed hyperlipidemia  mixed   Past Surgical History:  Procedure Laterality Date   CARDIAC CATHETERIZATION  2000   left heart   CARDIAC CATHETERIZATION N/A 03/22/2016   Procedure: Left Heart Cath And Coronary Angiography;  Surgeon: Antonieta Iba, MD;  Location: [facility] INVASIVE CV LAB;  Service: Cardiovascular;  Laterality: N/A;   COLONOSCOPY  2008   Dr. Servando Snare   Family History  Family history unknown: Yes   Social History   Socioeconomic History   Marital status: Married    Spouse name: Not on file   Number of children: 1   Years of education: Not on file   Highest education level: Not on file   Occupational History   Not on file  Tobacco Use   Smoking status: Never   Smokeless tobacco: Never  Vaping Use   Vaping status: Never Used  Substance and Sexual Activity   Alcohol use: No   Drug use: No   Sexual activity: Not on file  Other Topics Concern   Not on file  Social History Narrative   Not on file   Social Determinants of Health   Financial Resource Strain: Low Risk  (03/30/2023)   Overall Financial Resource Strain (CARDIA)    Difficulty of Paying Living Expenses: Not hard at all  Food Insecurity: No Food Insecurity (03/30/2023)   Hunger Vital Sign    Worried About Running Out of Food in the Last Year: Never true    Ran Out of Food in the Last Year: Never true  Transportation Needs: No Transportation Needs (03/30/2023)   PRAPARE - Administrator, Civil Service (Medical): No    Lack of Transportation (Non-Medical): No  Physical Activity: Sufficiently Active (03/30/2023)   Exercise Vital Sign    Days of Exercise per Week: 7 days    Minutes of Exercise per Session: 30 min  Stress: No Stress Concern Present (03/30/2023)   [employer] of Occupational Health - Occupational Stress Questionnaire    Feeling of Stress : Only a little  Social Connections: Moderately Isolated (03/30/2023)   Social Connection and Isolation Panel [NHANES]    Frequency of Communication with Friends and Family: More than three times a week    Frequency of Social Gatherings with Friends and Family: Three times a week    Attends Religious Services: Never    Active Member of Clubs or Organizations: No    Attends Engineer, structural: Never    Marital Status: Married    Tobacco Counseling Counseling given: Not Answered   Clinical Intake:  Pre-visit preparation completed: Yes  Pain : No/denies pain     BMI - recorded: 25.68 Nutritional Risks: None Diabetes: No  How often do you need to have someone help you when you read instructions, pamphlets, or other  written materials from your doctor or pharmacy?: 1 - Never  Interpreter Needed?: No  Comments: lives with wife Information entered by :: B.Susanna Benge,LPN   Activities of Daily Living    03/30/2023   10:43 AM 11/22/2022   10:02 AM  In your present state of health, do you have any difficulty performing the following activities:  Hearing? 1 1  Vision? 0 0  Difficulty concentrating or making decisions? 0 1  Walking or climbing stairs? 1 1  Dressing or bathing? 0 1  Doing errands, shopping? 1 1  Preparing Food and eating ? N   Using the Toilet? N   In the past six months, have you accidently leaked urine? N  Do you have problems with loss of bowel control? N   Managing your Medications? N   Managing your Finances? N   Housekeeping or managing your Housekeeping? Y     Patient Care Team: Jacky Kindle, [name] as PCP - General (Family Medicine) [place] Ouch, MD as PCP - Cardiology (Cardiology) [place] Ouch, MD as Consulting Physician (Cardiology) Pa, [facility])  Indicate any recent Medical Services you may have received from other than [facility] providers in the past year (date may be approximate).     Assessment:   This is a routine wellness examination for [place].  Hearing/Vision screen Hearing Screening - Comments:: Adequate hearing Vision Screening - Comments:: Adequate vision with glasses [facility]  Dietary issues and exercise activities discussed:     Goals Addressed             This Visit's Progress    DIET - EAT MORE FRUITS AND VEGETABLES   On track      Depression Screen    03/30/2023   10:37 AM 11/22/2022   10:01 AM 05/20/2022    9:42 AM 04/19/2022   10:04 AM 12/07/2021    9:11 AM 04/18/2021   10:35 AM 02/05/2020    1:31 PM  PHQ 2/9 Scores  PHQ - 2 Score 2 2 2 1 1  0 1  PHQ- 9 Score 4 12 7 1   3     Fall Risk    03/30/2023   10:20 AM 11/22/2022    9:58 AM 05/20/2022    9:41 AM 04/27/2022    9:46 AM 04/19/2022   10:09 AM   Fall Risk   Falls in the past year? 1 0 1 0 0  Number falls in past yr: 0 1   0  Injury with Fall? 0 1 0  0  Risk for fall due to : No Fall Risks Impaired balance/gait   No Fall Risks  Follow up Education provided;Falls prevention discussed    Falls evaluation completed    MEDICARE RISK AT HOME: Medicare Risk at Home Any stairs in or around the home?: Yes If so, are there any without handrails?: Yes Home free of loose throw rugs in walkways, pet beds, electrical cords, etc?: Yes Adequate lighting in your home to reduce risk of falls?: Yes Life alert?: No Use of a cane, walker or w/c?: Yes (has but not use much;rather hold to furniture) Grab bars in the bathroom?: Yes Shower chair or bench in shower?: Yes Elevated toilet seat or a handicapped toilet?: Yes  TIMED UP AND GO:  Was the test performed?  No    Cognitive Function:        03/30/2023   10:44 AM 04/18/2021   10:58 AM 02/05/2020    1:31 PM  6CIT Screen  What Year? 0 points 4 points 0 points  What month? 0 points 3 points 0 points  What time? 0 points 0 points 0 points  Count back from 20 0 points 0 points 0 points  Months in reverse 4 points 2 points 2 points  Repeat phrase 2 points 8 points 2 points  Total Score 6 points 17 points 4 points    Immunizations Immunization History  Administered Date(s) Administered   Fluad Quad(high Dose 65+) 06/05/2021, 05/20/2022   Influenza, High Dose Seasonal PF 06/21/2019   PFIZER Comirnaty(Gray Top)Covid-19 Tri-Sucrose Vaccine 09/10/2019, 10/01/2019, 02/25/2021   PFIZER(Purple Top)SARS-COV-2 Vaccination 09/10/2019, 10/01/2019   Pneumococcal Conjugate-13 02/05/2020    TDAP status: Up  to date  Flu Vaccine status: Up to date  Pneumococcal vaccine status: Up to date  Covid-19 vaccine status: Completed vaccines  Qualifies for Shingles Vaccine? Yes   Zostavax completed No   Shingrix Completed?: No.    Education has been provided regarding the importance of this vaccine.  Patient has been advised to call insurance company to determine out of pocket expense if they have not yet received this vaccine. Advised may also receive vaccine at local pharmacy or Health Dept. Verbalized acceptance and understanding.  Screening Tests Health Maintenance  Topic Date Due   DTaP/Tdap/Td (1 - Tdap) Never done   Zoster Vaccines- Shingrix (1 of 2) Never done   Pneumonia Vaccine 24+ Years old (2 of 2 - PPSV23 or PCV20) 02/04/2021   COVID-19 Vaccine (6 - 2023-24 season) 04/09/2022   INFLUENZA VACCINE  03/10/2023   Medicare Annual Wellness (AWV)  03/29/2024   HPV VACCINES  Aged Out    Health Maintenance  Health Maintenance Due  Topic Date Due   DTaP/Tdap/Td (1 - Tdap) Never done   Zoster Vaccines- Shingrix (1 of 2) Never done   Pneumonia Vaccine 7+ Years old (2 of 2 - PPSV23 or PCV20) 02/04/2021   COVID-19 Vaccine (6 - 2023-24 season) 04/09/2022   INFLUENZA VACCINE  03/10/2023    Colorectal cancer screening: No longer required.   Lung Cancer Screening: (Low Dose CT Chest recommended if Age 67-80 years, 20 pack-year currently smoking OR have quit w/in 15years.) does not qualify.   Lung Cancer Screening Referral: no  Additional Screening:  Hepatitis C Screening: does not qualify; Completed yes  Vision Screening: Recommended annual ophthalmology exams for early detection of glaucoma and other disorders of the eye. Is the patient up to date with their annual eye exam?  Yes  Who is the provider or what is the name of the office in which the patient attends annual eye exams? [facility] If pt is not established with a provider, would they like to be referred to a provider to establish care? No .   Dental Screening: Recommended annual dental exams for proper oral hygiene  Diabetic Foot Exam: n/a  Community Resource Referral / Chronic Care Management: CRR required this visit?  No   CCM required this visit?  No    Plan:     I have personally reviewed and noted  the following in the patient's chart:   Medical and social history Use of alcohol, tobacco or illicit drugs  Current medications and supplements including opioid prescriptions. Patient is not currently taking opioid prescriptions. Functional ability and status Nutritional status Physical activity Advanced directives List of other physicians Hospitalizations, surgeries, and ER visits in previous 12 months Vitals Screenings to include cognitive, depression, and falls Referrals and appointments  In addition, I have reviewed and discussed with patient certain preventive protocols, quality metrics, and best practice recommendations. A written personalized care plan for preventive services as well as general preventive health recommendations were provided to patient.    Sue Lush, LPN   11/05/5186   After Visit Summary: (MyChart) Due to this being a telephonic visit, the after visit summary with patients personalized plan was offered to patient via MyChart   Nurse Notes: Pt has episodes of anxiety: wants to explore treatment or how he can mange better at next appt in December. Pt wife to check on new rx from new company that supplies [employer].

## 2023-03-30 NOTE — Patient Instructions (Signed)
Mr. Cerreta , Thank you for taking time to come for your Medicare Wellness Visit. I appreciate your ongoing commitment to your health goals. Please review the following plan we discussed and let me know if I can assist you in the future.   Referrals/Orders/Follow-Ups/Clinician Recommendations: none  This is a list of the screening recommended for you and due dates:  Health Maintenance  Topic Date Due   DTaP/Tdap/Td vaccine (1 - Tdap) Never done   Zoster (Shingles) Vaccine (1 of 2) Never done   Pneumonia Vaccine (2 of 2 - PPSV23 or PCV20) 02/04/2021   COVID-19 Vaccine (6 - 2023-24 season) 04/09/2022   Flu Shot  03/10/2023   Medicare Annual Wellness Visit  03/29/2024   HPV Vaccine  Aged Out    Advanced directives: (Declined) Advance directive discussed with you today. Even though you declined this today, please call our office should you change your mind, and we can give you the proper paperwork for you to fill out.  Next Medicare Annual Wellness Visit scheduled for next year: Yes 04/03/2024 @ 10:15am telephone

## 2023-04-02 ENCOUNTER — Other Ambulatory Visit: Payer: Self-pay | Admitting: Family Medicine

## 2023-04-02 DIAGNOSIS — E7800 Pure hypercholesterolemia, unspecified: Secondary | ICD-10-CM

## 2023-04-02 DIAGNOSIS — I10 Essential (primary) hypertension: Secondary | ICD-10-CM

## 2023-04-04 NOTE — Telephone Encounter (Signed)
Requested Prescriptions  Pending Prescriptions Disp Refills   carvedilol (COREG) 12.5 MG tablet [Pharmacy Med Name: CARVEDILOL 12.5MG  TABLETS] 180 tablet 0    Sig: TAKE 1 TABLET(12.5 MG) BY MOUTH TWICE DAILY     Cardiovascular: Beta Blockers 3 Failed - 04/02/2023  3:29 AM      Failed - Cr in normal range and within 360 days    Creat  Date Value Ref Range Status  06/20/2017 1.43 (H) 0.70 - 1.18 mg/dL Final    Comment:    For patients >85 years of age, the reference limit for Creatinine is approximately 13% higher for people identified as African-American. .    Creatinine, Ser  Date Value Ref Range Status  11/22/2022 1.43 (H) 0.76 - 1.27 mg/dL Final         Passed - AST in normal range and within 360 days    AST  Date Value Ref Range Status  05/20/2022 9 0 - 40 IU/L Final         Passed - ALT in normal range and within 360 days    ALT  Date Value Ref Range Status  05/20/2022 13 0 - 44 IU/L Final         Passed - Last BP in normal range    BP Readings from Last 1 Encounters:  11/22/22 116/68         Passed - Last Heart Rate in normal range    Pulse Readings from Last 1 Encounters:  11/22/22 90         Passed - Valid encounter within last 6 months    Recent Outpatient Visits           4 months ago Gastroesophageal reflux disease with esophagitis without hemorrhage   [place] [facility] Jacky Kindle, [name]   10 months ago Annual physical exam   [facility] Merita Norton T, [name]   1 year ago Aortic valve insufficiency, etiology of cardiac valve disease unspecified   [facility] Jacky Kindle, [name]   1 year ago Essential hypertension   [place] [facility] Merita Norton T, [name]   1 year ago Essential hypertension   [place] [facility] Merita Norton T, [name]       Future Appointments             In 1 month Jacky Kindle, [name] [facility]             atorvastatin (LIPITOR) 20 MG tablet [Pharmacy Med Name: ATORVASTATIN 20MG  TABLETS] 90 tablet 0    Sig: TAKE 1 TABLET(20 MG) BY MOUTH DAILY AT 6 PM     Cardiovascular:  Antilipid - Statins Failed - 04/02/2023  3:29 AM      Failed - Lipid Panel in normal range within the last 12 months    Cholesterol, Total  Date Value Ref Range Status  05/20/2022 99 (L) 100 - 199 mg/dL Final   LDL Cholesterol (Calc)  Date Value Ref Range Status  06/20/2017 58 mg/dL (calc) Final    Comment:    Reference range: <100 . Desirable range <100 mg/dL for primary prevention;   <70 mg/dL for patients with CHD or diabetic patients  with > or = 2 CHD risk factors. Marland Kitchen LDL-C is now calculated using the Martin-Hopkins  calculation, which is a validated novel method providing  better accuracy than the Friedewald equation in the  estimation  of LDL-C.  Horald Pollen et al. Lenox Ahr. 1610;960(45): 2061-2068  (http://education.[employer].com/faq/FAQ164)    LDL Chol Calc (NIH)  Date Value Ref Range Status  05/20/2022 39 0 - 99 mg/dL Final   HDL  Date Value Ref Range Status  05/20/2022 44 >39 mg/dL Final   Triglycerides  Date Value Ref Range Status  05/20/2022 80 0 - 149 mg/dL Final         Passed - Patient is not pregnant      Passed - Valid encounter within last 12 months    Recent Outpatient Visits           4 months ago Gastroesophageal reflux disease with esophagitis without hemorrhage   [place] [facility] Jacky Kindle, [name]   10 months ago Annual physical exam   [facility] Merita Norton T, [name]   1 year ago Aortic valve insufficiency, etiology of cardiac valve disease unspecified   [place] [facility] Jacky Kindle, [name]   1 year ago Essential hypertension   [place] [facility] Jacky Kindle, [name]   1 year ago Essential hypertension   [place] [facility] Jacky Kindle, [name]       Future Appointments             In 1 month Suzie Portela, Daryl Eastern, [name] [facility], [facility]

## 2023-05-04 ENCOUNTER — Other Ambulatory Visit: Payer: Self-pay | Admitting: Family Medicine

## 2023-05-04 DIAGNOSIS — I10 Essential (primary) hypertension: Secondary | ICD-10-CM

## 2023-05-13 ENCOUNTER — Other Ambulatory Visit: Payer: Self-pay | Admitting: Family Medicine

## 2023-05-13 NOTE — Telephone Encounter (Signed)
Requested Prescriptions  Pending Prescriptions Disp Refills   levothyroxine (SYNTHROID) 50 MCG tablet [Pharmacy Med Name: LEVOTHYROXINE 0.05MG  ( ) TAB] 90 tablet 0    Sig: TAKE 1 TABLET BY MOUTH EVERY DAY AS DIRECTED     Endocrinology:  Hypothyroid Agents Passed - 05/13/2023  3:29 AM      Passed - TSH in normal range and within 360 days    TSH  Date Value Ref Range Status  11/22/2022 3.100 0.450 - 4.500 uIU/mL Final         Passed - Valid encounter within last 12 months    Recent Outpatient Visits           5 months ago Gastroesophageal reflux disease with esophagitis without hemorrhage   [place] [facility] Jacky Kindle, [name]   11 months ago Annual physical exam   [facility] Merita Norton T, [name]   1 year ago Aortic valve insufficiency, etiology of cardiac valve disease unspecified   [place] [facility] Jacky Kindle, [name]   1 year ago Essential hypertension   [place] [facility] Jacky Kindle, [name]   1 year ago Essential hypertension   [place] [facility] Jacky Kindle, [name]       Future Appointments             In 1 week Jacky Kindle, [name] [facility], [facility]

## 2023-05-24 ENCOUNTER — Encounter: Payer: Self-pay | Admitting: Family Medicine

## 2023-05-24 ENCOUNTER — Ambulatory Visit: Payer: Medicare Other | Admitting: Family Medicine

## 2023-05-24 VITALS — BP 110/71 | HR 74 | Temp 98.3°F | Ht 70.0 in | Wt 184.5 lb

## 2023-05-24 DIAGNOSIS — I10 Essential (primary) hypertension: Secondary | ICD-10-CM | POA: Diagnosis not present

## 2023-05-24 DIAGNOSIS — K2100 Gastro-esophageal reflux disease with esophagitis, without bleeding: Secondary | ICD-10-CM

## 2023-05-24 DIAGNOSIS — R54 Age-related physical debility: Secondary | ICD-10-CM

## 2023-05-24 DIAGNOSIS — R7303 Prediabetes: Secondary | ICD-10-CM

## 2023-05-24 DIAGNOSIS — R7989 Other specified abnormal findings of blood chemistry: Secondary | ICD-10-CM

## 2023-05-24 DIAGNOSIS — E782 Mixed hyperlipidemia: Secondary | ICD-10-CM

## 2023-05-24 DIAGNOSIS — Z0000 Encounter for general adult medical examination without abnormal findings: Secondary | ICD-10-CM | POA: Diagnosis not present

## 2023-05-24 DIAGNOSIS — K851 Biliary acute pancreatitis without necrosis or infection: Secondary | ICD-10-CM

## 2023-05-24 DIAGNOSIS — Z993 Dependence on wheelchair: Secondary | ICD-10-CM

## 2023-05-24 DIAGNOSIS — L97519 Non-pressure chronic ulcer of other part of right foot with unspecified severity: Secondary | ICD-10-CM

## 2023-05-24 DIAGNOSIS — I359 Nonrheumatic aortic valve disorder, unspecified: Secondary | ICD-10-CM

## 2023-05-24 DIAGNOSIS — I25111 Atherosclerotic heart disease of native coronary artery with angina pectoris with documented spasm: Secondary | ICD-10-CM

## 2023-05-24 DIAGNOSIS — Z23 Encounter for immunization: Secondary | ICD-10-CM | POA: Diagnosis not present

## 2023-05-24 DIAGNOSIS — F5101 Primary insomnia: Secondary | ICD-10-CM

## 2023-05-24 DIAGNOSIS — I83015 Varicose veins of right lower extremity with ulcer other part of foot: Secondary | ICD-10-CM | POA: Diagnosis not present

## 2023-05-24 DIAGNOSIS — D508 Other iron deficiency anemias: Secondary | ICD-10-CM

## 2023-05-24 DIAGNOSIS — Z0279 Encounter for issue of other medical certificate: Secondary | ICD-10-CM

## 2023-05-24 NOTE — Progress Notes (Signed)
Complete physical exam  Patient: James Mora   DOB: July 05, 1936   87 y.o. Male  MRN: [id number] Visit Date: 05/24/2023  Today's healthcare provider: Jacky Kindle, [name]  Re-introduced to nurse practitioner role and practice setting.  All questions answered.  Discussed provider/patient relationship and expectations.  No chief complaint on file.  Subjective    James Mora is a 87 y.o. male who presents today for a complete physical exam.  He reports consuming a general diet. The patient does not participate in regular exercise at present. He generally feels fairly well. He reports sleeping fairly well. He does have additional problems to discuss today.  HPI   The patient, an 87 year old with a history of varicose veins, heart disease, and gallbladder issues, presents with ongoing leg swelling and forgetfulness. The swelling is worse in the left foot and was particularly noticeable the previous evening after a day of increased activity. The patient denies any symptoms related to his gallbladder and reports no changes in bowel habits. The patient's heart has been behaving normally, with no abnormal sounds detected during the current examination.  The patient's mobility has decreased, with difficulty walking due to weakness in the legs, particularly the left leg. This has led to increased pressure on the right leg, causing hip and back pain. The patient's ears are dry, but this does not cause discomfort. The patient's teeth are in good condition with no issues with chewing reported.  The patient's partner reports that the patient's memory has been declining, with forgetfulness becoming more noticeable. The patient is able to recall past events clearly but struggles with short-term memory. The patient's sleep pattern is irregular, with frequent napping during the day.  Past Medical History:  Diagnosis Date   Acid reflux    Aortic insufficiency    a. 03/2016 Echo: EF 55-60%, no rwma, Gr1  DD, mild to mod AI, mild TR.   CAD (coronary artery disease)    a. 2000 Cath: Severe distal LAD disease with left to left collaterals;  b. 2008 MV: non-ischemic; c. 03/2016 Cath: LM nl, LAD 30p, D1/D2 mod, nl, LCX nl, OM1/2 nl w/ L-->L collats to apical LAD, RCA dominant, nl. EF 55-65%.   Hypertensive heart disease 2005   unspecified   Mixed hyperlipidemia    mixed   Past Surgical History:  Procedure Laterality Date   CARDIAC CATHETERIZATION  2000   left heart   CARDIAC CATHETERIZATION N/A 03/22/2016   Procedure: Left Heart Cath And Coronary Angiography;  Surgeon: Antonieta Iba, MD;  Location: [facility] INVASIVE CV LAB;  Service: Cardiovascular;  Laterality: N/A;   COLONOSCOPY  2008   Dr. Servando Snare   Social History   Socioeconomic History   Marital status: Married    Spouse name: Not on file   Number of children: 1   Years of education: Not on file   Highest education level: Not on file  Occupational History   Not on file  Tobacco Use   Smoking status: Never   Smokeless tobacco: Never  Vaping Use   Vaping status: Never Used  Substance and Sexual Activity   Alcohol use: No   Drug use: No   Sexual activity: Not on file  Other Topics Concern   Not on file  Social History Narrative   Not on file   Social Determinants of Health   Financial Resource Strain: Low Risk  (03/30/2023)   Overall Financial Resource Strain (CARDIA)    Difficulty of Paying  Living Expenses: Not hard at all  Food Insecurity: No Food Insecurity (03/30/2023)   Hunger Vital Sign    Worried About Running Out of Food in the Last Year: Never true    Ran Out of Food in the Last Year: Never true  Transportation Needs: No Transportation Needs (03/30/2023)   PRAPARE - Administrator, Civil Service (Medical): No    Lack of Transportation (Non-Medical): No  Physical Activity: Sufficiently Active (03/30/2023)   Exercise Vital Sign    Days of Exercise per Week: 7 days    Minutes of Exercise per Session: 30  min  Stress: No Stress Concern Present (03/30/2023)   [employer] of Occupational Health - Occupational Stress Questionnaire    Feeling of Stress : Only a little  Social Connections: Moderately Isolated (03/30/2023)   Social Connection and Isolation Panel [NHANES]    Frequency of Communication with Friends and Family: More than three times a week    Frequency of Social Gatherings with Friends and Family: Three times a week    Attends Religious Services: Never    Active Member of Clubs or Organizations: No    Attends Banker Meetings: Never    Marital Status: Married  Catering manager Violence: Not At Risk (03/30/2023)   Humiliation, Afraid, Rape, and Kick questionnaire    Fear of Current or Ex-Partner: No    Emotionally Abused: No    Physically Abused: No    Sexually Abused: No   Family Status  Relation Name Status   Mother  Deceased       killed when he was 75 months old   Father  Deceased       killed when he was 6 months old   Son  Alive  No partnership data on file   Family History  Family history unknown: Yes   Allergies  Allergen Reactions   Imodium [Loperamide] Nausea And Vomiting   Hydroxyzine Hcl     Other reaction(s): Other (See Comments) Sedation and myoclonic jerks of all four extremities. Noted 02/19/21.     Patient Care Team: Jacky Kindle, [name] as PCP - General (Family Medicine) Iran Ouch, MD as PCP - Cardiology (Cardiology) [place] Ouch, MD as Consulting Physician (Cardiology) Pa, [facility])   Medications: Outpatient Medications Prior to Visit  Medication Sig   acetaminophen (TYLENOL) 500 MG tablet Take 500 mg by mouth every 6 (six) hours as needed.   amLODipine (NORVASC) 2.5 MG tablet TAKE 1 TABLET(2.5 MG) BY MOUTH DAILY   aspirin 81 MG tablet Take 81 mg by mouth daily.   atorvastatin (LIPITOR) 20 MG tablet TAKE 1 TABLET(20 MG) BY MOUTH DAILY AT 6 PM   carvedilol (COREG) 12.5 MG tablet TAKE 1  TABLET(12.5 MG) BY MOUTH TWICE DAILY   furosemide (LASIX) 20 MG tablet TAKE 1 TABLET(20 MG) BY MOUTH DAILY   levothyroxine (SYNTHROID) 50 MCG tablet TAKE 1 TABLET BY MOUTH EVERY DAY AS DIRECTED   lipase/protease/amylase (CREON) 36000 UNITS CPEP capsule TAKE 1 CAPSULE BY MOUTH THREE TIMES DAILY WITH MEALS   nitroGLYCERIN (NITROSTAT) 0.4 MG SL tablet Place 0.4 mg under the tongue every 5 (five) minutes as needed.   omeprazole (PRILOSEC) 20 MG capsule TAKE 2 CAPSULES(40 MG) BY MOUTH DAILY   tamsulosin (FLOMAX) 0.4 MG CAPS capsule TAKE 1 CAPSULE(0.4 MG) BY MOUTH DAILY   Thiamine HCl (B-1) 100 MG TABS Take 1 tablet (100 mg total) by mouth daily.   No facility-administered medications  prior to visit.    Review of Systems    Objective    There were no vitals taken for this visit.   Physical Exam Vitals and nursing note reviewed.  Constitutional:      General: He is awake. He is not in acute distress.    Appearance: Normal appearance. He is well-developed, well-groomed and overweight. He is not ill-appearing, toxic-appearing or diaphoretic.  HENT:     Head: Normocephalic and atraumatic.     Jaw: There is normal jaw occlusion. No trismus, tenderness, swelling or pain on movement.     Salivary Glands: Right salivary gland is not diffusely enlarged or tender. Left salivary gland is not diffusely enlarged or tender.     Right Ear: Hearing, tympanic membrane, ear canal and external ear normal. There is no impacted cerumen.     Left Ear: Hearing, tympanic membrane, ear canal and external ear normal. There is no impacted cerumen.     Nose: Nose normal. No congestion or rhinorrhea.     Right Turbinates: Not enlarged, swollen or pale.     Left Turbinates: Not enlarged, swollen or pale.     Right Sinus: No maxillary sinus tenderness or frontal sinus tenderness.     Left Sinus: No maxillary sinus tenderness or frontal sinus tenderness.     Mouth/Throat:     Lips: Pink.     Mouth: Mucous membranes  are moist. No injury, lacerations, oral lesions or angioedema.     Pharynx: Oropharynx is clear. Uvula midline. No pharyngeal swelling, oropharyngeal exudate or posterior oropharyngeal erythema.     Tonsils: No tonsillar exudate or tonsillar abscesses.  Eyes:     General: Lids are normal. Vision grossly intact. Gaze aligned appropriately.        Right eye: No discharge.        Left eye: No discharge.     Extraocular Movements: Extraocular movements intact.     Conjunctiva/sclera: Conjunctivae normal.     Pupils: Pupils are equal, round, and reactive to light.  Neck:     Thyroid: No thyroid mass, thyromegaly or thyroid tenderness.     Vascular: No carotid bruit.     Trachea: Trachea normal. No tracheal tenderness.  Cardiovascular:     Rate and Rhythm: Normal rate and regular rhythm.     Pulses: Normal pulses.          Carotid pulses are 2+ on the right side and 2+ on the left side.      Radial pulses are 2+ on the right side and 2+ on the left side.       Femoral pulses are 2+ on the right side and 2+ on the left side.      Popliteal pulses are 2+ on the right side and 2+ on the left side.       Dorsalis pedis pulses are 2+ on the right side and 2+ on the left side.       Posterior tibial pulses are 2+ on the right side and 2+ on the left side.     Heart sounds: Normal heart sounds, S1 normal and S2 normal. No murmur heard.    No friction rub. No gallop.  Pulmonary:     Effort: Pulmonary effort is normal. No respiratory distress.     Breath sounds: Normal breath sounds and air entry. No stridor. No wheezing, rhonchi or rales.  Chest:     Chest wall: No tenderness.  Abdominal:     General: Abdomen is  flat. Bowel sounds are normal. There is no distension.     Palpations: Abdomen is soft. There is no mass.     Tenderness: There is no abdominal tenderness. There is no guarding or rebound.     Hernia: No hernia is present.  Genitourinary:    Comments: Exam deferred; denies  complaints Musculoskeletal:        General: No swelling, tenderness, deformity or signs of injury. Normal range of motion.     Cervical back: Normal range of motion and neck supple. No rigidity or tenderness.     Right lower leg: No edema.     Left lower leg: No edema.  Lymphadenopathy:     Cervical: No cervical adenopathy.     Right cervical: No superficial, deep or posterior cervical adenopathy.    Left cervical: No superficial, deep or posterior cervical adenopathy.  Skin:    General: Skin is warm and dry.     Capillary Refill: Capillary refill takes less than 2 seconds.     Coloration: Skin is not jaundiced or pale.     Findings: No bruising, erythema, lesion or rash.  Neurological:     General: No focal deficit present.     Mental Status: He is alert and oriented to person, place, and time. Mental status is at baseline.     GCS: GCS eye subscore is 4. GCS verbal subscore is 5. GCS motor subscore is 6.     Sensory: Sensation is intact. No sensory deficit.     Motor: Motor function is intact. No weakness.     Coordination: Coordination is intact.     Gait: Gait is intact.  Psychiatric:        Attention and Perception: Attention and perception normal.        Mood and Affect: Mood and affect normal.        Speech: Speech normal.        Behavior: Behavior normal. Behavior is cooperative.        Thought Content: Thought content normal.        Cognition and Memory: Cognition normal.        Judgment: Judgment normal.     Last depression screening scores    03/30/2023   10:37 AM 11/22/2022   10:01 AM 05/20/2022    9:42 AM  PHQ 2/9 Scores  PHQ - 2 Score 2 2 2   PHQ- 9 Score 4 12 7    Last fall risk screening    03/30/2023   10:20 AM  Fall Risk   Falls in the past year? 1  Number falls in past yr: 0  Injury with Fall? 0  Risk for fall due to : No Fall Risks  Follow up Education provided;Falls prevention discussed   Last Audit-C alcohol use screening    03/30/2023   10:28  AM  Alcohol Use Disorder Test (AUDIT)  1. How often do you have a drink containing alcohol? 0  2. How many drinks containing alcohol do you have on a typical day when you are drinking? 0  3. How often do you have six or more drinks on one occasion? 0  AUDIT-C Score 0   A score of 3 or more in women, and 4 or more in men indicates increased risk for alcohol abuse, EXCEPT if all of the points are from question 1   No results found for any visits on 05/24/23.  Assessment & Plan    Routine Health Maintenance and Physical Exam  Exercise  Activities and Dietary recommendations  Goals      DIET - EAT MORE FRUITS AND VEGETABLES     Track and Manage My Blood Pressure-Hypertension     Timeframe:  Long-Range Goal Priority:  High Start Date: 06/18/2021                            Expected End Date: 06/18/2022                      Follow Up within 90 days   - check blood pressure weekly    Why is this important?   You won't feel high blood pressure, but it can still hurt your blood vessels.  High blood pressure can cause heart or kidney problems. It can also cause a stroke.  Making lifestyle changes like losing a little weight or eating less salt will help.  Checking your blood pressure at home and at different times of the day can help to control blood pressure.  If the doctor prescribes medicine remember to take it the way the doctor ordered.  Call the office if you cannot afford the medicine or if there are questions about it.     Notes:         Immunization History  Administered Date(s) Administered   Fluad Quad(high Dose 65+) 06/05/2021, 05/20/2022   Influenza, High Dose Seasonal PF 06/21/2019   PFIZER Comirnaty(Gray Top)Covid-19 Tri-Sucrose Vaccine 09/10/2019, 10/01/2019, 02/25/2021   PFIZER(Purple Top)SARS-COV-2 Vaccination 09/10/2019, 10/01/2019   Pneumococcal Conjugate-13 02/05/2020    Health Maintenance  Topic Date Due   DTaP/Tdap/Td (1 - Tdap) Never done   Zoster  Vaccines- Shingrix (1 of 2) Never done   Pneumonia Vaccine 15+ Years old (2 of 2 - PPSV23 or PCV20) 02/04/2021   INFLUENZA VACCINE  03/10/2023   COVID-19 Vaccine (6 - 2023-24 season) 04/10/2023   Medicare Annual Wellness (AWV)  03/29/2024   HPV VACCINES  Aged Out    Discussed health benefits of physical activity, and encouraged him to engage in regular exercise appropriate for his age and condition.  Lower extremity edema Persistent despite conservative measures. No associated symptoms of heart failure. -Continue current management.  Mobility issues Decreased walking distance due to leg heaviness and imbalance. Associated hip and back pain due to overcompensation. -Encourage seated leg exercises and gradual increase in walking distance. -Consider cardiology referral to assess aortic valve status and potential for Transcatheter Aortic Valve Replacement (TAVR) to improve quality of life.  Memory concerns Noted short-term memory loss. No significant impact on daily activities. -Encourage mental stimulation activities such as puzzles and games. -Consider neurology referral if symptoms progress.  General Health Maintenance -Complete handicap sign paperwork. -Order labs today to assess overall health status. -Continue annual flu vaccination. -Discussed COVID-19 vaccination, decision deferred to primary care physician.  Follow-up -Cardiology for aortic valve assessment. -Neurology if memory concerns progress.  No follow-ups on file.     Leilani Merl, [name], have reviewed all documentation for this visit. The documentation on 05/24/23 for the exam, diagnosis, procedures, and orders are all accurate and complete.  Jacky Kindle, [name]  [facility] [phone] (phone) [phone] (fax)  [facility]

## 2023-05-24 NOTE — Patient Instructions (Signed)
The CDC recommends two doses of Shingrix (the new shingles vaccine) separated by 2 to 6 months for adults age 85 years and older. I recommend checking with your insurance plan regarding coverage for this vaccine and obtaining vaccination from your preferred retail pharmacy.

## 2023-05-25 ENCOUNTER — Other Ambulatory Visit: Payer: Self-pay | Admitting: Family Medicine

## 2023-05-25 DIAGNOSIS — N1831 Chronic kidney disease, stage 3a: Secondary | ICD-10-CM

## 2023-05-25 LAB — CBC WITH DIFFERENTIAL/PLATELET
Basophils Absolute: 0 10*3/uL (ref 0.0–0.2)
Basos: 1 %
EOS (ABSOLUTE): 0.1 10*3/uL (ref 0.0–0.4)
Eos: 1 %
Hematocrit: 42.6 % (ref 37.5–51.0)
Hemoglobin: 13.5 g/dL (ref 13.0–17.7)
Immature Grans (Abs): 0 10*3/uL (ref 0.0–0.1)
Immature Granulocytes: 0 %
Lymphocytes Absolute: 1.2 10*3/uL (ref 0.7–3.1)
Lymphs: 19 %
MCH: 29.4 pg (ref 26.6–33.0)
MCHC: 31.7 g/dL (ref 31.5–35.7)
MCV: 93 fL (ref 79–97)
Monocytes Absolute: 0.4 10*3/uL (ref 0.1–0.9)
Monocytes: 7 %
Neutrophils Absolute: 4.4 10*3/uL (ref 1.4–7.0)
Neutrophils: 72 %
Platelets: 186 10*3/uL (ref 150–450)
RBC: 4.59 x10E6/uL (ref 4.14–5.80)
RDW: 13.1 % (ref 11.6–15.4)
WBC: 6.1 10*3/uL (ref 3.4–10.8)

## 2023-05-25 LAB — LIPASE: Lipase: 25 U/L (ref 13–78)

## 2023-05-25 LAB — COMPREHENSIVE METABOLIC PANEL
ALT: 11 [IU]/L (ref 0–44)
AST: 12 [IU]/L (ref 0–40)
Albumin: 4.1 g/dL (ref 3.7–4.7)
Alkaline Phosphatase: 97 [IU]/L (ref 44–121)
BUN/Creatinine Ratio: 18 (ref 10–24)
BUN: 25 mg/dL (ref 8–27)
Bilirubin Total: 0.8 mg/dL (ref 0.0–1.2)
CO2: 22 mmol/L (ref 20–29)
Calcium: 9.3 mg/dL (ref 8.6–10.2)
Chloride: 108 mmol/L — ABNORMAL HIGH (ref 96–106)
Creatinine, Ser: 1.38 mg/dL — ABNORMAL HIGH (ref 0.76–1.27)
Globulin, Total: 2.3 g/dL (ref 1.5–4.5)
Glucose: 100 mg/dL — ABNORMAL HIGH (ref 70–99)
Potassium: 4.6 mmol/L (ref 3.5–5.2)
Sodium: 145 mmol/L — ABNORMAL HIGH (ref 134–144)
Total Protein: 6.4 g/dL (ref 6.0–8.5)
eGFR: 50 mL/min/{1.73_m2} — ABNORMAL LOW (ref >59)

## 2023-05-25 LAB — IRON,TIBC AND FERRITIN PANEL
Ferritin: 31 ng/mL (ref 30–400)
Iron Saturation: 32 % (ref 15–55)
Iron: 95 ug/dL (ref 38–169)
Total Iron Binding Capacity: 295 ug/dL (ref 250–450)
UIBC: 200 ug/dL (ref 111–343)

## 2023-05-25 LAB — TSH: TSH: 2.44 u[IU]/mL (ref 0.450–4.500)

## 2023-05-25 LAB — LIPID PANEL
Chol/HDL Ratio: 2.9 {ratio} (ref 0.0–5.0)
Cholesterol, Total: 104 mg/dL (ref 100–199)
HDL: 36 mg/dL — ABNORMAL LOW (ref >39)
LDL Chol Calc (NIH): 44 mg/dL (ref 0–99)
Triglycerides: 135 mg/dL (ref 0–149)
VLDL Cholesterol Cal: 24 mg/dL (ref 5–40)

## 2023-05-25 LAB — HEMOGLOBIN A1C
Est. average glucose Bld gHb Est-mCnc: 131 mg/dL
Hgb A1c MFr Bld: 6.2 % — ABNORMAL HIGH (ref 4.8–5.6)

## 2023-05-25 LAB — VITAMIN D 25 HYDROXY (VIT D DEFICIENCY, FRACTURES): Vit D, 25-Hydroxy: 13.7 ng/mL — ABNORMAL LOW (ref 30.0–100.0)

## 2023-05-25 LAB — AMYLASE: Amylase: 57 U/L (ref 31–110)

## 2023-05-25 MED ORDER — VITAMIN D3 125 MCG (5000 UT) PO CAPS
5000.0000 [IU] | ORAL_CAPSULE | Freq: Every day | ORAL | 1 refills | Status: AC
Start: 2023-05-25 — End: ?

## 2023-05-25 NOTE — Progress Notes (Signed)
Stable chronic kidney disease with creatinine at 1.38 and eGFR at 50. Labs confirm elevated sodium and chloride, associated with slight dehydration. Ensure 36-48 oz/water/day. Recommend consult with nephrology if desired.   Prediabetes remains stable.  Cholesterol remains at goal.  Recommend high dose 5000 IU Vit D 3 supplement once daily, available OTC with one meal. Repeat labs in 6 months.

## 2023-06-23 ENCOUNTER — Other Ambulatory Visit: Payer: Self-pay | Admitting: Nephrology

## 2023-06-23 DIAGNOSIS — R809 Proteinuria, unspecified: Secondary | ICD-10-CM

## 2023-06-23 DIAGNOSIS — N1831 Chronic kidney disease, stage 3a: Secondary | ICD-10-CM

## 2023-06-23 DIAGNOSIS — E785 Hyperlipidemia, unspecified: Secondary | ICD-10-CM

## 2023-06-23 DIAGNOSIS — R8290 Unspecified abnormal findings in urine: Secondary | ICD-10-CM

## 2023-06-23 DIAGNOSIS — I10 Essential (primary) hypertension: Secondary | ICD-10-CM

## 2023-06-25 ENCOUNTER — Other Ambulatory Visit: Payer: Self-pay | Admitting: Family Medicine

## 2023-06-27 NOTE — Telephone Encounter (Signed)
Requested Prescriptions  Pending Prescriptions Disp Refills   tamsulosin (FLOMAX) 0.4 MG CAPS capsule [Pharmacy Med Name: TAMSULOSIN 0.4MG  CAPSULES] 90 capsule 0    Sig: TAKE 1 CAPSULE(0.4 MG) BY MOUTH DAILY     Urology: Alpha-Adrenergic Blocker Failed - 06/25/2023  3:29 AM      Failed - PSA in normal range and within 360 days    PSA  Date Value Ref Range Status  06/20/2017 3.7 < OR = 4.0 ng/mL Final    Comment:    The total PSA value from this assay system is  standardized against the WHO standard. The test  result will be approximately 20% lower when compared  to the equimolar-standardized total PSA (Beckman  Coulter). Comparison of serial PSA results should be  interpreted with this fact in mind. . This test was performed using the Siemens  chemiluminescent method. Values obtained from  different assay methods cannot be used interchangeably. PSA levels, regardless of value, should not be interpreted as absolute evidence of the presence or absence of disease.    Prostate Specific Ag, Serum  Date Value Ref Range Status  09/08/2021 5.4 (H) 0.0 - 4.0 ng/mL Final    Comment:    Roche ECLIA methodology. According to the American Urological Association, Serum PSA should decrease and remain at undetectable levels after radical prostatectomy. The AUA defines biochemical recurrence as an initial PSA value 0.2 ng/mL or greater followed by a subsequent confirmatory PSA value 0.2 ng/mL or greater. Values obtained with different assay methods or kits cannot be used interchangeably. Results cannot be interpreted as absolute evidence of the presence or absence of malignant disease.          Passed - Last BP in normal range    BP Readings from Last 1 Encounters:  05/24/23 110/71         Passed - Valid encounter within last 12 months    Recent Outpatient Visits           1 month ago Annual physical exam   [facility] Merita Norton T, [name]   7 months ago  Gastroesophageal reflux disease with esophagitis without hemorrhage   [facility] Jacky Kindle, [name]   1 year ago Annual physical exam   [facility] Merita Norton T, [name]   1 year ago Aortic valve insufficiency, etiology of cardiac valve disease unspecified   [facility] Jacky Kindle, [name]   1 year ago Essential hypertension    [facility] Jacky Kindle, [name]       Future Appointments             In 4 months Jacky Kindle, [name] [facility]

## 2023-06-30 ENCOUNTER — Ambulatory Visit
Admission: RE | Admit: 2023-06-30 | Discharge: 2023-06-30 | Disposition: A | Discharge status: Home / Self Care | Payer: Medicare Other | Source: Ambulatory Visit | Attending: Nephrology | Admitting: Nephrology

## 2023-06-30 ENCOUNTER — Telehealth: Payer: Self-pay | Admitting: Family Medicine

## 2023-06-30 DIAGNOSIS — R8290 Unspecified abnormal findings in urine: Secondary | ICD-10-CM | POA: Diagnosis present

## 2023-06-30 DIAGNOSIS — R809 Proteinuria, unspecified: Secondary | ICD-10-CM | POA: Diagnosis present

## 2023-06-30 DIAGNOSIS — N1831 Chronic kidney disease, stage 3a: Secondary | ICD-10-CM

## 2023-06-30 DIAGNOSIS — E785 Hyperlipidemia, unspecified: Secondary | ICD-10-CM | POA: Diagnosis present

## 2023-06-30 DIAGNOSIS — I10 Essential (primary) hypertension: Secondary | ICD-10-CM | POA: Diagnosis present

## 2023-06-30 NOTE — Telephone Encounter (Signed)
Pt and wife dropped off forms to be filled out and faxed for medication.  Placed in Elise's box

## 2023-07-01 ENCOUNTER — Other Ambulatory Visit: Payer: Self-pay | Admitting: Family Medicine

## 2023-07-01 DIAGNOSIS — I10 Essential (primary) hypertension: Secondary | ICD-10-CM

## 2023-07-01 DIAGNOSIS — E7800 Pure hypercholesterolemia, unspecified: Secondary | ICD-10-CM

## 2023-07-04 NOTE — Telephone Encounter (Signed)
Faxed and sent to scan center

## 2023-07-25 ENCOUNTER — Other Ambulatory Visit: Payer: Self-pay | Admitting: Family Medicine

## 2023-07-25 DIAGNOSIS — K2100 Gastro-esophageal reflux disease with esophagitis, without bleeding: Secondary | ICD-10-CM

## 2023-08-08 ENCOUNTER — Other Ambulatory Visit: Payer: Self-pay | Admitting: Family Medicine

## 2023-08-11 ENCOUNTER — Other Ambulatory Visit: Payer: Self-pay | Admitting: Pharmacist

## 2023-08-11 NOTE — Progress Notes (Signed)
   08/11/2023  Patient ID: Albino PARAS Garber, male   DOB: Oct 25, 1935, 88 y.o.   MRN: [id number]  Called and spoke with [facility] in regards to Junior today as he was followed by the pharmacist Marolyn previously. Reports the PAP for Creon  has already been renewed- which has been scanned into the profile. As for new Losartan, patient is doing well.  Lastly, advised the Kelly is no longer with the office and that his appointment in April 2025 will likely be changed to the [name] [facility] or PA Janna Ostwalt. Reports he is fine with other.   Only concern currently is his inability to walk well, but I am unable to assist with this concern. No need for follow-up at this time.    Aloysius Breeding, PharmD [facility] Phone Number: [phone]

## 2023-09-15 ENCOUNTER — Encounter: Payer: Self-pay | Admitting: Medical

## 2023-09-15 ENCOUNTER — Ambulatory Visit: Payer: Medicare Other | Attending: Medical | Admitting: Medical

## 2023-09-15 VITALS — BP 130/72 | HR 67 | Ht 70.0 in | Wt 187.6 lb

## 2023-09-15 DIAGNOSIS — I351 Nonrheumatic aortic (valve) insufficiency: Secondary | ICD-10-CM

## 2023-09-15 DIAGNOSIS — I10 Essential (primary) hypertension: Secondary | ICD-10-CM | POA: Diagnosis not present

## 2023-09-15 DIAGNOSIS — I2510 Atherosclerotic heart disease of native coronary artery without angina pectoris: Secondary | ICD-10-CM

## 2023-09-15 DIAGNOSIS — E782 Mixed hyperlipidemia: Secondary | ICD-10-CM

## 2023-09-15 DIAGNOSIS — I5032 Chronic diastolic (congestive) heart failure: Secondary | ICD-10-CM | POA: Diagnosis not present

## 2023-09-15 NOTE — Patient Instructions (Signed)
 Medication Instructions:  Your physician recommends the following medication changes.  INCREASE: Lasix  to 40 mg by mouth daily for 3 days, then resume 20 mg daily thereafter.   *If you need a refill on your cardiac medications before your next appointment, please call your pharmacy*   Lab Work: No labs ordered today    Testing/Procedures: Your physician has requested that you have an echocardiogram. Echocardiography is a painless test that uses sound waves to create images of your heart. It provides your doctor with information about the size and shape of your heart and how well your heart's chambers and valves are working.   You may receive an ultrasound enhancing agent through an IV if needed to better visualize your heart during the echo. This procedure takes approximately one hour.  There are no restrictions for this procedure.  This will take place at [address] [facility], [place] [address]  Please note: We ask at that you not bring children with you during ultrasound (echo/ vascular) testing. Due to room size and safety concerns, children are not allowed in the ultrasound rooms during exams. Our front office staff cannot provide observation of children in our lobby area while testing is being conducted. An adult accompanying a patient to their appointment will only be allowed in the ultrasound room at the discretion of the ultrasound technician under special circumstances. We apologize for any inconvenience.    Follow-Up: At [facility], you and your health needs are our priority.  As part of our continuing mission to provide you with exceptional heart care, we have created designated Provider Care Teams.  These Care Teams include your primary Cardiologist (physician) and Advanced Practice Providers (APPs -  Physician Assistants and Nurse Practitioners) who all work together to provide you with the care you need, when you need it.  We recommend  signing up for the patient portal called MyChart.  Sign up information is provided on this After Visit Summary.  MyChart is used to connect with patients for Virtual Visits (Telemedicine).  Patients are able to view lab/test results, encounter notes, upcoming appointments, etc.  Non-urgent messages can be sent to your provider as well.   To learn more about what you can do with MyChart, go to [email or web address].    Your next appointment:   1 year(s)  Provider:   You may see Deatrice Cage, MD or one of the following Advanced Practice Providers on your designated Care Team:   Lonni Meager, NP Bernardino Bring, PA-C Cadence Franchester, PA-C Tylene Lunch, NP Barnie Hila, NP

## 2023-09-15 NOTE — Progress Notes (Signed)
 Cardiology Office Note:  .   Date:  09/15/2023  ID:  James Mora, DOB Dec 26, 1935, MRN [id number] PCP: Dineen Channel, PA-C  [facility] Providers Cardiologist:  Deatrice Cage, MD {  History of Present Illness: .   James Mora is a 88 y.o. male with a h/o CAD, aortic insuffiencey, hypertensive heart disease, CKD stage 3, HLD who presents for 6 month follow-up.     Cardiac catheterization in 2000 showed severe distal LAD disease with left to left collaterals that was managed medically at that time.  He was hospitalized in August 2017 with non-ST elevation myocardial infarction. LHC showed mild proximal LAD disease with 30% stenosis and possible occlusion of a small PL 3.  Ejection fraction was normal.  Medical management was recommended.  Echocardiogram showed normal LV systolic function with stable mild to moderate aortic insufficiency.    He had significant leg edema that improved after stopping amlodipine .     He was hospitalized in July 2022 with acute pancreatitis with gallstones and enteritis.  The patient had peripancreatic edema resulting in significant abdominal pain.  He had an MRCP done which showed no evidence of stones but there was tapering of the common bile duct. The patient was transferred to [facility] where he stayed there for another week and then he was discharged to rehab.  The plan was to proceed with cholecystectomy but giving ongoing issues with abdominal pain, that has not happened yet.   Seen 09/17/21 for pre-op cardiovascular evaluation for cholecystectomy. He was 8-10lbs up and started on lasix . Echo showed LVEF 50-55%, no WMA, G1DD, mild MR, mild artic root dilation measuring 41mm.  Patient was last seen 09/2022 and was stable from a cardiac perspective.  Today, the patient is overall doing well. He uses a cane when he goes out. He walks at home around the house to work on balance. He denies chest pain. He has occasional SOB. He has swelling on his feet, he takes  lasix  20mg  daily.   Studies Reviewed: SABRA   EKG Interpretation Date/Time:  Thursday September 15 2023 10:57:02 EST Ventricular Rate:  67 PR Interval:  214 QRS Duration:  152 QT Interval:  424 QTC Calculation: 448 R Axis:   -1  Text Interpretation: Sinus rhythm with 1st degree A-V block Right bundle branch block Inferior infarct , age undetermined When compared with ECG of 27-Feb-2021 02:16, PREVIOUS ECG IS PRESENT Confirmed by Franchester, Jourdyn Ferrin ([id number]) on 09/15/2023 11:09:43 AM    Echo 10/2021 1. Left ventricular ejection fraction, by estimation, is 50 to 55%. The  left ventricle has low normal function. The left ventricle has no regional  wall motion abnormalities. The left ventricular internal cavity size was  mildly dilated. There is mild  left ventricular hypertrophy. Left ventricular diastolic parameters are  consistent with Grade I diastolic dysfunction (impaired relaxation).   2. Right ventricular systolic function is normal. The right ventricular  size is normal. There is normal pulmonary artery systolic pressure. The  estimated right ventricular systolic pressure is 23.7 mmHg.   3. Left atrial size was mildly dilated.   4. The mitral valve is normal in structure. Mild mitral valve  regurgitation. No evidence of mitral stenosis.   5. The aortic valve is tricuspid. Aortic valve regurgitation is moderate.  Aortic valve sclerosis is present, with no evidence of aortic valve  stenosis.   6. The inferior vena cava is normal in size with greater than 50%  respiratory variability, suggesting right atrial pressure of  3 mmHg.   7. There is mild dilatation of the aortic root, measuring 41 mm. There is  mild dilatation of the ascending aorta, measuring 41 mm.   8. Frequent PVCs   Comparison(s): EF 70-75%; Aorta 4.1 cm.   Echo 01/2021  1. Left ventricular ejection fraction, by estimation, is 70 to 75%. The  left ventricle has hyperdynamic function. The left ventricle has no  regional wall  motion abnormalities. Left ventricular diastolic parameters  were normal.   2. Right ventricular systolic function is normal. The right ventricular  size is mildly enlarged. Tricuspid regurgitation signal is inadequate for  assessing PA pressure.   3. The mitral valve is grossly normal. No evidence of mitral valve  regurgitation. No evidence of mitral stenosis.   4. The aortic valve is tricuspid. Aortic valve regurgitation is mild.  Mild aortic valve sclerosis is present, with no evidence of aortic valve  stenosis.   5. Aortic dilatation noted. There is mild dilatation of the aortic root,  measuring 41 mm.   LHC 03/2016 Prox LAD lesion, 30 %stenosed.       Physical Exam:   VS:  BP 130/72 (BP Location: Left Arm, Patient Position: Sitting, Cuff Size: Normal)   Pulse 67   Ht 5' 10 (1.778 m)   Wt 187 lb 9.6 oz (85.1 kg)   SpO2 97%   BMI 26.92 kg/m    Wt Readings from Last 3 Encounters:  09/15/23 187 lb 9.6 oz (85.1 kg)  05/24/23 184 lb 8 oz (83.7 kg)  03/30/23 179 lb (81.2 kg)    GEN: Well nourished, well developed in no acute distress NECK: No JVD; No carotid bruits CARDIAC: RRR, no murmurs, rubs, gallops RESPIRATORY:  Clear to auscultation without rales, wheezing or rhonchi  ABDOMEN: +distended EXTREMITIES:  trace lower leg edema; No deformity   ASSESSMENT AND PLAN: .    Chronic diastolic heart failure Patient reports minimal DOE with swelling at the end of the day. Abdomen is firm on exam. He takes lasix  20mg  daily. Increase to 40mg  daily x3 days, then back down to 20mg  daily. I will repeat an echocardiogram.   CAD Patient denies chest pain, however he is not very active at baseline. No further ischemic work-up at this time.   HLD LDL 39 in 2023, continue Lipitor 20mg  daily.   HTN BP is OK today, 130/72. Continue amlodipine  2.5mg  daily and Coreg  12.5mg  BID.  Aortic Insufficiency Aortic root dilation/ascending aortic dilation Echo 10/2021 showed LVEF 50-55%, no WMA,  mild LVH, G1DD, mild MR, moderate AI and mild aortic root dilation, measuring 41mm, mild Ascending aorta dilation, measuring 41mm. Repeat echo  Dispo: Follow-up in 1 year  Signed, Dillin Lofgren VEAR Fishman, PA-C

## 2023-09-22 ENCOUNTER — Telehealth: Payer: Self-pay | Admitting: Physician Assistant

## 2023-09-22 MED ORDER — TAMSULOSIN HCL 0.4 MG PO CAPS: 0.4000 mg | ORAL_CAPSULE | Freq: Every day | ORAL | 0 refills | Status: DC

## 2023-09-22 NOTE — Telephone Encounter (Signed)
[facility] pharmacy is requesting refill tamsulosin (FLOMAX) 0.4 MG CAPS capsule   Please advise

## 2023-09-26 ENCOUNTER — Telehealth: Payer: Self-pay | Admitting: Physician Assistant

## 2023-09-26 NOTE — Telephone Encounter (Signed)
[facility] pharmacy faxed refill request for the following medications:   carvedilol (COREG) 12.5 MG tablet    Please advise

## 2023-09-26 NOTE — Telephone Encounter (Signed)
[facility] faxed refill request for the following medications:   atorvastatin (LIPITOR) 20 MG tablet    Please advise

## 2023-09-27 ENCOUNTER — Other Ambulatory Visit: Payer: Self-pay

## 2023-09-27 DIAGNOSIS — E7800 Pure hypercholesterolemia, unspecified: Secondary | ICD-10-CM

## 2023-09-27 DIAGNOSIS — I10 Essential (primary) hypertension: Secondary | ICD-10-CM

## 2023-09-28 ENCOUNTER — Other Ambulatory Visit: Payer: Self-pay | Admitting: Physician Assistant

## 2023-09-28 DIAGNOSIS — E7800 Pure hypercholesterolemia, unspecified: Secondary | ICD-10-CM

## 2023-09-28 DIAGNOSIS — I10 Essential (primary) hypertension: Secondary | ICD-10-CM

## 2023-09-28 MED ORDER — ATORVASTATIN CALCIUM 20 MG PO TABS: ORAL_TABLET | ORAL | 0 refills | Status: DC

## 2023-09-28 MED ORDER — CARVEDILOL 12.5 MG PO TABS: ORAL_TABLET | ORAL | 0 refills | Status: DC

## 2023-09-29 NOTE — Telephone Encounter (Signed)
Requested medication (s) are due for refill today:   Yes written 09/28/2023 by Debera Lat, PA-C  Requested medication (s) are on the active medication list:   Yes for both  Future visit scheduled:   No   LOV was a physical by Merita Norton   Last ordered: Coreg 09/28/2023 #120, 0 refills;   Atorvastatin 09/28/2023 #60, 0 refills  Returned because pt is requesting a 90 day supply.   Requested Prescriptions  Pending Prescriptions Disp Refills   carvedilol (COREG) 12.5 MG tablet [Pharmacy Med Name: CARVEDILOL 12.5MG  TABLETS] 180 tablet     Sig: TAKE 1 TABLET BY MOUTH TWICE DAILY     There is no refill protocol information for this order     atorvastatin (LIPITOR) 20 MG tablet [Pharmacy Med Name: ATORVASTATIN 20MG  TABLETS] 90 tablet     Sig: TAKE 1 TABLET BY MOUTH AT 6 PM     There is no refill protocol information for this order

## 2023-10-03 ENCOUNTER — Ambulatory Visit: Payer: Medicare Other | Attending: Medical

## 2023-10-03 DIAGNOSIS — I5032 Chronic diastolic (congestive) heart failure: Secondary | ICD-10-CM | POA: Diagnosis not present

## 2023-10-03 DIAGNOSIS — I351 Nonrheumatic aortic (valve) insufficiency: Secondary | ICD-10-CM

## 2023-10-03 LAB — ECHOCARDIOGRAM COMPLETE
AV Mean grad: 4 mm[Hg]
AV Peak grad: 8 mm[Hg]
Ao pk vel: 1.41 m/s
Area-P 1/2: 3.91 cm2
S' Lateral: 3.7 cm

## 2023-11-04 ENCOUNTER — Telehealth: Payer: Self-pay | Admitting: Physician Assistant

## 2023-11-04 MED ORDER — LEVOTHYROXINE SODIUM 50 MCG PO TABS: 50.0000 ug | ORAL_TABLET | Freq: Every day | ORAL | 0 refills | Status: DC

## 2023-11-04 NOTE — Telephone Encounter (Signed)
[facility] faxed refill request for the following medications: ? ?levothyroxine (SYNTHROID) 50 MCG tablet  ? ?Please advise. ? ?

## 2023-11-04 NOTE — Telephone Encounter (Signed)
 refilled

## 2023-11-07 ENCOUNTER — Other Ambulatory Visit: Payer: Self-pay | Admitting: Medical

## 2023-11-22 ENCOUNTER — Ambulatory Visit: Payer: Medicare Other | Admitting: Family Medicine

## 2023-11-25 ENCOUNTER — Other Ambulatory Visit: Payer: Self-pay | Admitting: Physician Assistant

## 2023-11-25 DIAGNOSIS — I10 Essential (primary) hypertension: Secondary | ICD-10-CM

## 2023-11-25 DIAGNOSIS — E7800 Pure hypercholesterolemia, unspecified: Secondary | ICD-10-CM

## 2023-12-08 ENCOUNTER — Ambulatory Visit (INDEPENDENT_AMBULATORY_CARE_PROVIDER_SITE_OTHER): Admitting: Physician Assistant

## 2023-12-08 ENCOUNTER — Encounter: Payer: Self-pay | Admitting: Physician Assistant

## 2023-12-08 VITALS — BP 109/62 | HR 70 | Resp 16 | Ht 70.0 in | Wt 177.0 lb

## 2023-12-08 DIAGNOSIS — Z993 Dependence on wheelchair: Secondary | ICD-10-CM | POA: Diagnosis not present

## 2023-12-08 DIAGNOSIS — R7303 Prediabetes: Secondary | ICD-10-CM

## 2023-12-08 DIAGNOSIS — R7989 Other specified abnormal findings of blood chemistry: Secondary | ICD-10-CM

## 2023-12-08 DIAGNOSIS — E782 Mixed hyperlipidemia: Secondary | ICD-10-CM | POA: Diagnosis not present

## 2023-12-08 DIAGNOSIS — N1831 Chronic kidney disease, stage 3a: Secondary | ICD-10-CM

## 2023-12-08 DIAGNOSIS — D508 Other iron deficiency anemias: Secondary | ICD-10-CM

## 2023-12-08 DIAGNOSIS — I10 Essential (primary) hypertension: Secondary | ICD-10-CM

## 2023-12-08 DIAGNOSIS — R54 Age-related physical debility: Secondary | ICD-10-CM

## 2023-12-08 DIAGNOSIS — Z7689 Persons encountering health services in other specified circumstances: Secondary | ICD-10-CM

## 2023-12-08 NOTE — Progress Notes (Unsigned)
 Established patient visit  Patient: James Mora   DOB: 10/04/1935   88 y.o. Male  MRN: [id number] Visit Date: 12/08/2023  Today's healthcare provider: Blane Bunting, PA-C   Chief Complaint  Patient presents with   Follow-up    Chronic Disease f/u    Subjective     HPI     Follow-up    Additional comments: Chronic Disease f/u       Last edited by Estill Hemming, CMA on 12/08/2023 10:22 AM.       Discussed the use of AI scribe software for clinical note transcription with the patient, who gave verbal consent to proceed.  History of Present Illness        12/08/2023   10:27 AM 03/30/2023   10:37 AM 11/22/2022   10:01 AM  Depression screen PHQ 2/9  Decreased Interest 3 1 1   Down, Depressed, Hopeless 1 1 1   PHQ - 2 Score 4 2 2   Altered sleeping 2 0 3  Tired, decreased energy 3 1 3   Change in appetite 0 0 0  Feeling bad or failure about yourself  3 1 1   Trouble concentrating 3 0 1  Moving slowly or fidgety/restless 3 0 2  Suicidal thoughts 0 0 0  PHQ-9 Score 18 4 12   Difficult doing work/chores Somewhat difficult Not difficult at all Somewhat difficult      12/08/2023   10:27 AM  GAD 7 : Generalized Anxiety Score  Nervous, Anxious, on Edge 3  Control/stop worrying 1  Worry too much - different things 3  Trouble relaxing 3  Restless 2  Easily annoyed or irritable 1  Afraid - awful might happen 2  Total GAD 7 Score 15  Anxiety Difficulty Somewhat difficult    Medications: Outpatient Medications Prior to Visit  Medication Sig   acetaminophen  (TYLENOL ) 500 MG tablet Take 500 mg by mouth every 6 (six) hours as needed.   amLODipine  (NORVASC ) 2.5 MG tablet TAKE 1 TABLET(2.5 MG) BY MOUTH DAILY   aspirin  81 MG tablet Take 81 mg by mouth daily.   atorvastatin  (LIPITOR) 20 MG tablet TAKE 1 TABLET BY MOUTH AT 6 PM   carvedilol  (COREG ) 12.5 MG tablet TAKE 1 TABLET BY MOUTH TWICE DAILY   Cholecalciferol (VITAMIN D3) 125 MCG (5000 UT) CAPS Take 1 capsule (5,000 Units  total) by mouth daily.   furosemide  (LASIX ) 20 MG tablet TAKE 1 TABLET(20 MG) BY MOUTH DAILY   levothyroxine  (SYNTHROID ) 50 MCG tablet Take 1 tablet (50 mcg total) by mouth daily.   lipase/protease/amylase (CREON ) 36000 UNITS CPEP capsule TAKE 1 CAPSULE BY MOUTH THREE TIMES DAILY WITH MEALS   nitroGLYCERIN  (NITROSTAT ) 0.4 MG SL tablet Place 0.4 mg under the tongue every 5 (five) minutes as needed.   omeprazole  (PRILOSEC) 20 MG capsule TAKE 2 CAPSULES(40 MG) BY MOUTH DAILY   tamsulosin  (FLOMAX ) 0.4 MG CAPS capsule Take 1 capsule (0.4 mg total) by mouth daily.   Thiamine HCl (B-1) 100 MG TABS Take 1 tablet (100 mg total) by mouth daily.   No facility-administered medications prior to visit.    Review of Systems All negative Except see HPI   {Insert previous labs (optional):23779} {See past labs  Heme  Chem  Endocrine  Serology  Results Review (optional):1}   Objective    BP 109/62 (BP Location: Left Arm, Patient Position: Sitting)   Pulse 70   Resp 16   Ht 5\' 10"  (1.778 m)   Wt 177 lb (80.3 kg)  SpO2 100%   BMI 25.40 kg/m  {Insert last BP/Wt (optional):23777}{See vitals history (optional):1}   Physical Exam   No results found for any visits on 12/08/23.      Assessment and Plan Assessment & Plan     Orders Placed This Encounter  Procedures   Hemoglobin A1c   TSH   Comprehensive metabolic panel with GFR    Has the patient fasted?:   Yes   CBC with Differential/Platelet   Lipid panel    Has the patient fasted?:   Yes    Return in about 1 month (around 01/08/2024) for chronic disease f/u.   The patient was advised to call back or seek an in-person evaluation if the symptoms worsen or if the condition fails to improve as anticipated.  I discussed the assessment and treatment plan with the patient. The patient was provided an opportunity to ask questions and all were answered. The patient agreed with the plan and demonstrated an understanding of the  instructions.  I, Ka Flammer, PA-C have reviewed all documentation for this visit. The documentation on 12/08/2023  for the exam, diagnosis, procedures, and orders are all accurate and complete.  Blane Bunting, [facility], MMS [facility] [phone] (phone) [phone] (fax)  [facility]

## 2023-12-10 LAB — CBC WITH DIFFERENTIAL/PLATELET
Basophils Absolute: 0 10*3/uL (ref 0.0–0.2)
Basos: 1 %
EOS (ABSOLUTE): 0.1 10*3/uL (ref 0.0–0.4)
Eos: 1 %
Hematocrit: 41 % (ref 37.5–51.0)
Hemoglobin: 13 g/dL (ref 13.0–17.7)
Immature Grans (Abs): 0 10*3/uL (ref 0.0–0.1)
Immature Granulocytes: 0 %
Lymphocytes Absolute: 1.5 10*3/uL (ref 0.7–3.1)
Lymphs: 21 %
MCH: 29.3 pg (ref 26.6–33.0)
MCHC: 31.7 g/dL (ref 31.5–35.7)
MCV: 92 fL (ref 79–97)
Monocytes Absolute: 0.5 10*3/uL (ref 0.1–0.9)
Monocytes: 6 %
Neutrophils Absolute: 5.2 10*3/uL (ref 1.4–7.0)
Neutrophils: 71 %
Platelets: 214 10*3/uL (ref 150–450)
RBC: 4.44 x10E6/uL (ref 4.14–5.80)
RDW: 14.8 % (ref 11.6–15.4)
WBC: 7.3 10*3/uL (ref 3.4–10.8)

## 2023-12-10 LAB — TSH: TSH: 4.2 u[IU]/mL (ref 0.450–4.500)

## 2023-12-10 LAB — COMPREHENSIVE METABOLIC PANEL WITH GFR
ALT: 12 IU/L (ref 0–44)
AST: 13 IU/L (ref 0–40)
Albumin: 4.1 g/dL (ref 3.7–4.7)
Alkaline Phosphatase: 98 IU/L (ref 44–121)
BUN/Creatinine Ratio: 20 (ref 10–24)
BUN: 32 mg/dL — ABNORMAL HIGH (ref 8–27)
Bilirubin Total: 0.5 mg/dL (ref 0.0–1.2)
CO2: 21 mmol/L (ref 20–29)
Calcium: 9.4 mg/dL (ref 8.6–10.2)
Chloride: 106 mmol/L (ref 96–106)
Creatinine, Ser: 1.64 mg/dL — ABNORMAL HIGH (ref 0.76–1.27)
Globulin, Total: 2.7 g/dL (ref 1.5–4.5)
Glucose: 109 mg/dL — ABNORMAL HIGH (ref 70–99)
Potassium: 5.2 mmol/L (ref 3.5–5.2)
Sodium: 143 mmol/L (ref 134–144)
Total Protein: 6.8 g/dL (ref 6.0–8.5)
eGFR: 41 mL/min/{1.73_m2} — ABNORMAL LOW (ref >59)

## 2023-12-10 LAB — HEMOGLOBIN A1C
Est. average glucose Bld gHb Est-mCnc: 131 mg/dL
Hgb A1c MFr Bld: 6.2 % — ABNORMAL HIGH (ref 4.8–5.6)

## 2023-12-10 LAB — LIPID PANEL
Chol/HDL Ratio: 3.1 ratio (ref 0.0–5.0)
Cholesterol, Total: 111 mg/dL (ref 100–199)
HDL: 36 mg/dL — ABNORMAL LOW (ref >39)
LDL Chol Calc (NIH): 51 mg/dL (ref 0–99)
Triglycerides: 134 mg/dL (ref 0–149)
VLDL Cholesterol Cal: 24 mg/dL (ref 5–40)

## 2023-12-11 DIAGNOSIS — N1831 Chronic kidney disease, stage 3a: Secondary | ICD-10-CM | POA: Insufficient documentation

## 2023-12-11 DIAGNOSIS — R54 Age-related physical debility: Secondary | ICD-10-CM | POA: Insufficient documentation

## 2023-12-11 DIAGNOSIS — Z993 Dependence on wheelchair: Secondary | ICD-10-CM | POA: Insufficient documentation

## 2023-12-12 ENCOUNTER — Encounter: Payer: Self-pay | Admitting: Physician Assistant

## 2023-12-19 ENCOUNTER — Other Ambulatory Visit: Payer: Self-pay | Admitting: Physician Assistant

## 2024-01-16 ENCOUNTER — Other Ambulatory Visit: Payer: Self-pay | Admitting: Physician Assistant

## 2024-01-16 DIAGNOSIS — K2100 Gastro-esophageal reflux disease with esophagitis, without bleeding: Secondary | ICD-10-CM

## 2024-01-25 ENCOUNTER — Telehealth: Payer: Self-pay | Admitting: Physician Assistant

## 2024-01-25 ENCOUNTER — Other Ambulatory Visit: Payer: Self-pay

## 2024-01-25 DIAGNOSIS — I10 Essential (primary) hypertension: Secondary | ICD-10-CM

## 2024-01-25 MED ORDER — AMLODIPINE BESYLATE 2.5 MG PO TABS
ORAL_TABLET | ORAL | 3 refills | Status: DC
Start: 2024-01-25 — End: 2024-05-11

## 2024-01-25 NOTE — Telephone Encounter (Signed)
Converted to refill request 

## 2024-01-25 NOTE — Telephone Encounter (Signed)
 [facility] pharmacy faxed refill request for the following medications:    amLODipine  (NORVASC ) 2.5 MG tablet  Please advise

## 2024-02-02 ENCOUNTER — Other Ambulatory Visit: Payer: Self-pay | Admitting: Physician Assistant

## 2024-02-06 NOTE — Progress Notes (Signed)
 [facility] Follow Up Visit   Patient Name: James Mora, male   Patient DOB: [birth year] Date of Service: 02/06/2024  Patient MRN: [id number] Provider Creating Note: Woodward Brought, MD  [phone] Primary Care Physician: Emilio Kelly DASEN, [name]   [address] [place] [address] Additional Physicians/ Providers:   Impression/Recommendations   James Mora is a 88 y.o. male hypertension, aortic valve disease, BPH, coronary artery disease, GERD, hypothyroidism who presents as a follow up patient for evaluation of chronic kidney disease stage IIIA. Creatinine 1.45, GFR of 47.    Chronic kidney disease stage IIIA: with proteinuria: with hypertension and NSAIDs. (Advil) - Continue Losartan 25mg  daily.  - not currently on a SGLT-2 inhibitor.  - Continue spironolactone 25mg  daily.  - avoid nonsteroidal anti-inflammatorya gents.    Hypertension, essential: 114/71 - Current regimen of losartan, spironolactone, amlodipine , carvedilol , furosemide  and tamsulosin  - recommend discontinuation of amlodipine  if hypotension symptoms continue.     Hyperlipidemia:  - Continue atorvastatin   Patient Active Problem List  Diagnosis  . Chronic kidney disease stage 3A (HCC)  . Benign essential hypertension  . Hyperlipidemia  . Proteinuria  . Secondary hyperparathyroidism of renal origin [facility])    Orders Placed This Encounter  . PTH, Intact  . Renal Function Panel  . CBC and Differential  . Urinalysis, Complete w/reflex to Culture  . Protein, Total, Random Urine w/Creatinine (Protein/Creat Ratio)       Return in about 6 months (around 08/07/2024).  Chief Complaint   Chief Complaint  Patient presents with  . Follow-up    History of Present Illness   James Mora presents for follow up. Patient presents with his wife who assists with history taking. Wife states that patient has been having some confusion at night time. Also, patient has fallen a few times  since last visit.   Patient was started on spironolactone daily on last visit. Patient states his tolerating this well. He states his blood pressure is well controlled.   Patient denies use of nonsteroidal anti-inflammatory agents.   Patient denies any recent hospitalizations.   Medications   Current Outpatient Medications:  .  acetaminophen  (TYLENOL ) 500 MG tablet, Take 500 mg by mouth every 6 (six) hours if needed, Disp: , Rfl:  .  amLODIPine  (NORVASC ) 2.5 MG tablet, Take 1 tablet by mouth 1 (one) time each day, Disp: , Rfl:  .  aspirin  (ST JOSEPH) 81 MG EC tablet, Take 81 mg by mouth in the morning., Disp: , Rfl:  .  atorvastatin  (LIPITOR) 20 MG tablet, TAKE 1 TABLET(20 MG) BY MOUTH DAILY AT 6 PM, Disp: , Rfl:  .  carvedilol  (COREG ) 12.5 MG tablet, Take 1 tablet by mouth in the morning and 1 tablet in the evening., Disp: , Rfl:  .  cholecalciferol (VITAMIN D -3) 125 MCG (5000 UT) capsule, Take 5,000 Units by mouth 1 (one) time each day, Disp: , Rfl:  .  furosemide  (LASIX ) 20 MG tablet, Take 1 tablet by mouth 1 (one) time each day, Disp: , Rfl:  .  levothyroxine  (SYNTHROID , LEVOTHROID) 50 MCG tablet, Take 1 tablet by mouth 1 (one) time each day, Disp: , Rfl:  .  losartan (Cozaar) 25 MG tablet, Take 1 tablet (25 mg total) by mouth 1 (one) time each day, Disp: 30 tablet, Rfl: 11 .  nitroglycerin  (NITROSTAT ) 0.4 MG SL tablet, Place 0.4 mg under the tongue every 5 (five) minutes if needed for chest pain, Disp: , Rfl:  .  omeprazole  (PriLOSEC) 20 MG DR capsule, TAKE 2 CAPSULES(40 MG) BY MOUTH DAILY, Disp: , Rfl:  .  Pancrelipase , Lip-Prot-Amyl, 36000-114000 units capsule delayed-release particles, Take 1 capsule by mouth in the morning and 1 capsule at noon and 1 capsule in the evening. Take with meals., Disp: , Rfl:  .  spironolactone (Aldactone) 25 MG tablet, Take 1 tablet (25 mg total) by mouth 1 (one) time each day, Disp: 30 tablet, Rfl: 11 .  tamsulosin  (FLOMAX ) 0.4 MG 24 hr capsule, Take  0.4 mg by mouth in the morning., Disp: , Rfl:  .  thiamine (,VITAMIN B-1,) 100 MG tablet, Take 100 mg by mouth 1 (one) time each day, Disp: , Rfl:    Allergies Loperamide and Hydroxyzine   History Past Medical History:  Diagnosis Date  . Aortic valve stenosis with insufficiency   . Benign essential hypertension 06/22/2023  . Benign prostatic hyperplasia   . Chronic kidney disease stage 3A (HCC) 06/22/2023  . Coronary atherosclerosis of unspecified type of vessel, native or graft   . Gallstone acute pancreatitis   . Gastroesophageal reflux disease   . Hyperlipidemia   . Hypothyroidism   . Pancreatic insufficiency   . Proteinuria 06/22/2023  . Secondary hyperparathyroidism of renal origin (HCC) 07/21/2023    Past Surgical History:  Procedure Laterality Date  . ANKLE FRACTURE SURGERY Left 1962   History reviewed. No pertinent family history. Social History   Tobacco Use  . Smoking status: Never  . Smokeless tobacco: Never  Substance Use Topics  . Alcohol use: Never     Physical Exam  Vitals BP 114/71 (BP Location: Right upper arm, Patient Position: Sitting)   Pulse 81   Temp 97.7 F   Wt 183 lb 6.4 oz (83.2 kg)   SpO2 96%   BMI 26.32 kg/m   Vitals reviewed. Constitutional: He is oriented to person, place, and time. He appears well-developed.  HEENT:  Head: Normocephalic and atraumatic. Mouth/Throat: Oropharynx is clear and moist.  Eyes: Pupils are equal, round, and reactive to light.  Neck: Neck supple.  Cardiovascular:  Normal rate and regular rhythm.           Pulmonary/Chest: Effort normal and breath sounds normal.  Abdominal: Soft.  Neurological: He is alert and oriented to person, place, and time. Gait abnormal.  Shuffling gait.  Skin: Skin is warm and dry.     Laboratory Studies  Chemistry  Lab Units 10/13/23 0949 06/22/23 1523 06/22/23 1449  SODIUM mmol/L 142 144  --   POTASSIUM mmol/L 4.3 4.2  --   CHLORIDE mmol/L 108 109  --   CO2 mmol/L 26  24  --   MAGNESIUM  mg/dL  --  2.3  --   CALCIUM  mg/dL 9.3 9.2  --   PHOSPHORUS mg/dL 3.2 3.2  --   PTH pg/mL  --  126*  --   GLUCOSE mg/dL 896* 871*  --   ALBUMIN g/dL 4.0 3.7*  3.9 10 mg/L  BUN mg/dL 22 16  --   CREATININE mg/dL 8.54* 8.70*  --         No lab exists for component: IRON SATURATION, TRANSSATPER  CBC  Lab Units 10/13/23 0949 06/22/23 1523  WBC AUTO Thousand/uL  --  6.0  HEMOGLOBIN g/dL  --  86.2  HEMOGLOBIN URINE  TRACE*  --   HEMATOCRIT %  --  42.6  MCV fL  --  92.2  PLATELETS AUTO Thousand/uL  --  225    Urine  Lab Units  10/13/23 0949 06/22/23 1523 06/22/23 1449  COLOR U  YELLOW  --   --   COLOR UA   --   --  Yellow  CLARITY UA   --   --  Clear  KETONES U MG/DL  NEGATIVE  --   --   KETONES UA   --   --  Negative  PH UA   --   --  6.0  UROBILINOGEN UA   --   --  0.2  PROT/CREAT RATIO UR mg/g creat  --  0.242*  242*  --         No lab exists for component: CYCLOSPORITR     Woodward Brought, MD  [employer], [place]

## 2024-02-09 ENCOUNTER — Ambulatory Visit (INDEPENDENT_AMBULATORY_CARE_PROVIDER_SITE_OTHER): Admitting: Physician Assistant

## 2024-02-09 VITALS — BP 95/61 | Resp 16

## 2024-02-09 DIAGNOSIS — I359 Nonrheumatic aortic valve disorder, unspecified: Secondary | ICD-10-CM

## 2024-02-09 DIAGNOSIS — E559 Vitamin D deficiency, unspecified: Secondary | ICD-10-CM

## 2024-02-09 DIAGNOSIS — I10 Essential (primary) hypertension: Secondary | ICD-10-CM

## 2024-02-09 DIAGNOSIS — E782 Mixed hyperlipidemia: Secondary | ICD-10-CM

## 2024-02-09 DIAGNOSIS — N400 Enlarged prostate without lower urinary tract symptoms: Secondary | ICD-10-CM

## 2024-02-09 DIAGNOSIS — K2100 Gastro-esophageal reflux disease with esophagitis, without bleeding: Secondary | ICD-10-CM

## 2024-02-09 DIAGNOSIS — Z993 Dependence on wheelchair: Secondary | ICD-10-CM | POA: Diagnosis not present

## 2024-02-09 DIAGNOSIS — K8689 Other specified diseases of pancreas: Secondary | ICD-10-CM

## 2024-02-09 DIAGNOSIS — E038 Other specified hypothyroidism: Secondary | ICD-10-CM

## 2024-02-09 DIAGNOSIS — R600 Localized edema: Secondary | ICD-10-CM

## 2024-02-09 DIAGNOSIS — M1909 Primary osteoarthritis, other specified site: Secondary | ICD-10-CM

## 2024-02-09 NOTE — Progress Notes (Signed)
 Established patient visit  Patient: James Mora   DOB: Oct 24, 1935   88 y.o. Male  MRN: [id number] Visit Date: 02/09/2024  Today's healthcare provider: Jolynn Spencer, PA-C   Chief Complaint  Patient presents with   Follow-up    F/u on Chronic Disease.. no other concerns   Subjective     HPI     Follow-up    Additional comments: F/u on Chronic Disease.. no other concerns      Last edited by Marylen Odella CROME, CMA on 02/09/2024 10:33 AM.       Discussed the use of AI scribe software for clinical note transcription with the patient, who gave verbal consent to proceed.  History of Present Illness James Mora is an 88 year old male who presents for a follow-up visit for chronic peripheral edema. He is accompanied by his wife.  He experiences chronic peripheral edema, particularly when his feet are not elevated. The swelling lessens with leg elevation, and his wife assists him in maintaining this position during sleep. He is currently taking furosemide  20 mg daily and spironolactone 25 mg daily.  He has a history of several minor heart attacks and is currently on carvedilol  12.5 mg twice daily and aspirin . He experiences shortness of breath if standing for extended periods. No current chest pain or palpitations.  He is on atorvastatin  20 mg with no recent issues with cholesterol levels. He experiences acid reflux managed with omeprazole  20 mg twice daily. He takes levothyroxine  50 mcg every morning for hyperthyroidism with no current issues. He experiences tingling in his right hand and occasional hip pain, possibly due to arthritis. He uses a walker and a cane for mobility and engages in daily exercises.  He has a history of pancreatic insufficiency and is taking pancreatic enzymes, although he has reduced the frequency from three times to twice daily. He experiences anxiety, particularly in unfamiliar settings, which increases his need to use the bathroom. .ga       12/08/2023   10:27 AM 03/30/2023   10:37 AM 11/22/2022   10:01 AM  Depression screen PHQ 2/9  Decreased Interest 3 1 1   Down, Depressed, Hopeless 1 1 1   PHQ - 2 Score 4 2 2   Altered sleeping 2 0 3  Tired, decreased energy 3 1 3   Change in appetite 0 0 0  Feeling bad or failure about yourself  3 1 1   Trouble concentrating 3 0 1  Moving slowly or fidgety/restless 3 0 2  Suicidal thoughts 0 0 0  PHQ-9 Score 18 4 12   Difficult doing work/chores Somewhat difficult Not difficult at all Somewhat difficult      12/08/2023   10:27 AM  GAD 7 : Generalized Anxiety Score  Nervous, Anxious, on Edge 3  Control/stop worrying 1  Worry too much - different things 3  Trouble relaxing 3  Restless 2  Easily annoyed or irritable 1  Afraid - awful might happen 2  Total GAD 7 Score 15  Anxiety Difficulty Somewhat difficult    Medications: Outpatient Medications Prior to Visit  Medication Sig   acetaminophen  (TYLENOL ) 500 MG tablet Take 500 mg by mouth every 6 (six) hours as needed.   aspirin  81 MG tablet Take 81 mg by mouth daily.   atorvastatin  (LIPITOR) 20 MG tablet TAKE 1 TABLET BY MOUTH AT 6 PM   carvedilol  (COREG ) 12.5 MG tablet TAKE 1 TABLET BY MOUTH TWICE DAILY   Cholecalciferol (VITAMIN D3) 125 MCG (5000 UT) CAPS  Take 1 capsule (5,000 Units total) by mouth daily.   furosemide  (LASIX ) 20 MG tablet TAKE 1 TABLET(20 MG) BY MOUTH DAILY   levothyroxine  (SYNTHROID ) 50 MCG tablet TAKE 1 TABLET(50 MCG) BY MOUTH DAILY   lipase/protease/amylase (CREON ) 36000 UNITS CPEP capsule TAKE 1 CAPSULE BY MOUTH THREE TIMES DAILY WITH MEALS   nitroGLYCERIN  (NITROSTAT ) 0.4 MG SL tablet Place 0.4 mg under the tongue every 5 (five) minutes as needed.   omeprazole  (PRILOSEC) 20 MG capsule TAKE 2 CAPSULES(40 MG) BY MOUTH DAILY   tamsulosin  (FLOMAX ) 0.4 MG CAPS capsule TAKE 1 CAPSULE(0.4 MG) BY MOUTH DAILY   Thiamine HCl (B-1) 100 MG TABS Take 1 tablet (100 mg total) by mouth daily.   amLODipine  (NORVASC ) 2.5 MG  tablet TAKE 1 TABLET(2.5 MG) BY MOUTH DAILY (Patient not taking: Reported on 02/09/2024)   No facility-administered medications prior to visit.    Review of Systems All negative Except see HPI       Objective    BP 95/61 (BP Location: Right Arm, Patient Position: Sitting, Cuff Size: Large)   Resp 16   SpO2 98%     Physical Exam Vitals reviewed.  Constitutional:      General: He is not in acute distress.    Appearance: Normal appearance. He is not diaphoretic.  HENT:     Head: Normocephalic and atraumatic.  Eyes:     General: No scleral icterus.    Conjunctiva/sclera: Conjunctivae normal.  Cardiovascular:     Rate and Rhythm: Normal rate and regular rhythm.     Pulses: Normal pulses.     Heart sounds: Normal heart sounds. No murmur heard. Pulmonary:     Effort: Pulmonary effort is normal. No respiratory distress.     Breath sounds: Normal breath sounds. No wheezing or rhonchi.  Musculoskeletal:     Cervical back: Neck supple.     Right lower leg: No edema.     Left lower leg: No edema.  Lymphadenopathy:     Cervical: No cervical adenopathy.  Skin:    General: Skin is warm and dry.     Findings: No rash.  Neurological:     Mental Status: He is alert and oriented to person, place, and time. Mental status is at baseline.  Psychiatric:        Mood and Affect: Mood normal.        Behavior: Behavior normal.      No results found for any visits on 02/09/24.     Transition of care from [facility] Assessment & Plan Peripheral Edema Chronic edema likely due to cardiac and renal issues. Nephrologist suggested stopping one diuretic, but decision made to monitor before changes. - Continue furosemide  20 mg daily. - Continue spironolactone 25 mg daily. - Monitor blood pressure and swelling. - Encourage leg elevation.  Hypertension Chronic Managed with carvedilol  and losartan. Nephrologist advised stopping amlodipine ; decision made to monitor closely after  discontinuation. - Stop amlodipine  as per nephrologist's advice. - Continue carvedilol  12.5 mg twice daily. - Continue losartan. - Monitor blood pressure and swelling.  Aortic Insufficiency Mild condition with no significant changes. Cardiologist recommended follow-up in one year unless symptoms worsen. - Follow up with cardiology in one year. - Monitor for symptoms such as chest pain, shortness of breath, or palpitations.  Hyperlipidemia Managed with atorvastatin . Cardiologist recommended continuing medication. Regular monitoring of cholesterol levels necessary. - Continue atorvastatin  20 mg daily. - Order cholesterol blood test.  Pancreatic Insufficiency Medication taken less frequently than prescribed, potentially  affecting symptom control. - Encourage taking pancreatic enzyme replacement as prescribed (one capsule three times daily).  Gastroesophageal Reflux Disease (GERD) Managed with omeprazole . Reports daily use necessary for symptom control. Lifestyle modifications recommended. - Continue omeprazole  20 mg twice daily. - Advise avoiding spicy, acidic foods and beverages.  Hypothyroidism Managed with levothyroxine . TSH levels need monitoring to adjust medication as needed. - Continue levothyroxine  50 mcg daily. - Order TSH test.  Benign Prostatic Hyperplasia (BPH) Managed with tamsulosin . Reports urinary symptoms, indicating need for continued medication. - Continue tamsulosin  as prescribed.  Osteoarthritis Suspected in right hip and knee causing pain. Encouraged to maintain mobility to manage symptoms. - Encourage regular movement and stretching exercises. - Consider using a stationary exercise machine for low-impact activity.  General Health Maintenance Discussed importance of regular exercise and mental stimulation for overall health. - Encourage daily physical activity and stretching. - Recommend cognitive exercises such as word search or puzzles. - Suggest  watching [place] on YouTube for joint exercises.  Follow-up Scheduled follow-up for annual physical and chronic condition management. - Schedule follow-up appointment for October 15th for annual physical and chronic condition management. - Perform blood work including cholesterol, complete blood count, electrolytes, and TSH after fasting. Essential hypertension (Primary) - Lipid panel - Comprehensive metabolic panel with GFR - CBC with Differential/Platelet - TSH  Wheelchair dependent  Vitamin D  deficiency - VITAMIN D  25 Hydroxy (Vit-D Deficiency, Fractures)  Orders Placed This Encounter  Procedures   Lipid panel    Has the patient fasted?:   Yes   Comprehensive metabolic panel with GFR    Has the patient fasted?:   Yes   CBC with Differential/Platelet   TSH   VITAMIN D  25 Hydroxy (Vit-D Deficiency, Fractures)    Return in about 3 months (around 05/11/2024) for chronic disease f/u, CPE.   The patient was advised to call back or seek an in-person evaluation if the symptoms worsen or if the condition fails to improve as anticipated.  I discussed the assessment and treatment plan with the patient. The patient was provided an opportunity to ask questions and all were answered. The patient agreed with the plan and demonstrated an understanding of the instructions.  I, Andrew Blasius, PA-C have reviewed all documentation for this visit. The documentation on 02/09/2024  for the exam, diagnosis, procedures, and orders are all accurate and complete.  Jolynn Spencer, [facility], MMS [facility] [phone] (phone) [phone] (fax)  [facility]

## 2024-02-13 ENCOUNTER — Encounter: Payer: Self-pay | Admitting: Physician Assistant

## 2024-02-13 DIAGNOSIS — K8689 Other specified diseases of pancreas: Secondary | ICD-10-CM | POA: Insufficient documentation

## 2024-02-13 DIAGNOSIS — M1990 Unspecified osteoarthritis, unspecified site: Secondary | ICD-10-CM | POA: Insufficient documentation

## 2024-02-13 DIAGNOSIS — E038 Other specified hypothyroidism: Secondary | ICD-10-CM | POA: Insufficient documentation

## 2024-02-13 DIAGNOSIS — R600 Localized edema: Secondary | ICD-10-CM | POA: Insufficient documentation

## 2024-02-16 ENCOUNTER — Ambulatory Visit: Payer: Self-pay | Admitting: Physician Assistant

## 2024-02-16 LAB — CBC WITH DIFFERENTIAL/PLATELET
Basophils Absolute: 0 x10E3/uL (ref 0.0–0.2)
Basos: 1 %
EOS (ABSOLUTE): 0.1 x10E3/uL (ref 0.0–0.4)
Eos: 1 %
Hematocrit: 40.6 % (ref 37.5–51.0)
Hemoglobin: 12.9 g/dL — ABNORMAL LOW (ref 13.0–17.7)
Immature Grans (Abs): 0 x10E3/uL (ref 0.0–0.1)
Immature Granulocytes: 0 %
Lymphocytes Absolute: 1.3 x10E3/uL (ref 0.7–3.1)
Lymphs: 21 %
MCH: 30.5 pg (ref 26.6–33.0)
MCHC: 31.8 g/dL (ref 31.5–35.7)
MCV: 96 fL (ref 79–97)
Monocytes Absolute: 0.4 x10E3/uL (ref 0.1–0.9)
Monocytes: 6 %
Neutrophils Absolute: 4.6 x10E3/uL (ref 1.4–7.0)
Neutrophils: 71 %
Platelets: 204 x10E3/uL (ref 150–450)
RBC: 4.23 x10E6/uL (ref 4.14–5.80)
RDW: 14.5 % (ref 11.6–15.4)
WBC: 6.4 x10E3/uL (ref 3.4–10.8)

## 2024-02-16 LAB — COMPREHENSIVE METABOLIC PANEL WITH GFR
ALT: 12 IU/L (ref 0–44)
AST: 10 IU/L (ref 0–40)
Albumin: 3.9 g/dL (ref 3.7–4.7)
Alkaline Phosphatase: 93 IU/L (ref 44–121)
BUN/Creatinine Ratio: 18 (ref 10–24)
BUN: 25 mg/dL (ref 8–27)
Bilirubin Total: 0.6 mg/dL (ref 0.0–1.2)
CO2: 20 mmol/L (ref 20–29)
Calcium: 9.5 mg/dL (ref 8.6–10.2)
Chloride: 106 mmol/L (ref 96–106)
Creatinine, Ser: 1.37 mg/dL — ABNORMAL HIGH (ref 0.76–1.27)
Globulin, Total: 2.5 g/dL (ref 1.5–4.5)
Glucose: 104 mg/dL — ABNORMAL HIGH (ref 70–99)
Potassium: 4.5 mmol/L (ref 3.5–5.2)
Sodium: 143 mmol/L (ref 134–144)
Total Protein: 6.4 g/dL (ref 6.0–8.5)
eGFR: 51 mL/min/1.73 — ABNORMAL LOW (ref >59)

## 2024-02-16 LAB — LIPID PANEL
Chol/HDL Ratio: 3.1 ratio (ref 0.0–5.0)
Cholesterol, Total: 105 mg/dL (ref 100–199)
HDL: 34 mg/dL — ABNORMAL LOW (ref >39)
LDL Chol Calc (NIH): 47 mg/dL (ref 0–99)
Triglycerides: 133 mg/dL (ref 0–149)
VLDL Cholesterol Cal: 24 mg/dL (ref 5–40)

## 2024-02-16 LAB — VITAMIN D 25 HYDROXY (VIT D DEFICIENCY, FRACTURES): Vit D, 25-Hydroxy: 57.1 ng/mL (ref 30.0–100.0)

## 2024-02-16 LAB — TSH: TSH: 4.17 u[IU]/mL (ref 0.450–4.500)

## 2024-02-21 ENCOUNTER — Other Ambulatory Visit: Payer: Self-pay | Admitting: Physician Assistant

## 2024-02-21 DIAGNOSIS — E7800 Pure hypercholesterolemia, unspecified: Secondary | ICD-10-CM

## 2024-02-21 DIAGNOSIS — I10 Essential (primary) hypertension: Secondary | ICD-10-CM

## 2024-03-16 ENCOUNTER — Other Ambulatory Visit: Payer: Self-pay | Admitting: Physician Assistant

## 2024-04-03 ENCOUNTER — Ambulatory Visit (INDEPENDENT_AMBULATORY_CARE_PROVIDER_SITE_OTHER): Payer: Medicare Other

## 2024-04-03 DIAGNOSIS — Z0000 Encounter for general adult medical examination without abnormal findings: Secondary | ICD-10-CM

## 2024-04-03 NOTE — Patient Instructions (Signed)
 Mr. Nanney , Thank you for taking time out of your busy schedule to complete your Annual Wellness Visit with me. I enjoyed our conversation and look forward to speaking with you again next year. I, as well as your care team,  appreciate your ongoing commitment to your health goals. Please review the following plan we discussed and let me know if I can assist you in the future.  Follow up Visits: 04/09/25 @ 9:30 AM BY PHONE We will see or speak with you next year for your Next Medicare AWV with our clinical staff Have you seen your provider in the last 6 months (3 months if uncontrolled diabetes)? Yes  Clinician Recommendations:  Aim for 30 minutes of exercise or brisk walking, 6-8 glasses of water, and 5 servings of fruits and vegetables each day. TAKE CARE!      This is a list of the screenings recommended for you:  Health Maintenance  Topic Date Due   DTaP/Tdap/Td vaccine (1 - Tdap) Never done   Zoster (Shingles) Vaccine (1 of 2) Never done   COVID-19 Vaccine (6 - 2024-25 season) 04/10/2023   Flu Shot  03/09/2024   Medicare Annual Wellness Visit  04/03/2025   Pneumococcal Vaccine for age over 52  Completed   HPV Vaccine  Aged Out   Meningitis B Vaccine  Aged Out    Advanced directives: (ACP Link)Information on Advanced Care Planning can be found at [place]  Print production planner Health Care Directives Advance Health Care Directives. [email or web address]  Advance Care Planning is important because it:  [x]  Makes sure you receive the medical care that is consistent with your values, goals, and preferences  [x]  It provides guidance to your family and loved ones and reduces their decisional burden about whether or not they are making the right decisions based on your wishes.  Follow the link provided in your after visit summary or read over the paperwork we have mailed to you to help you started getting your Advance Directives in place. If you need assistance in completing these, please reach  out to us  so that we can help you!

## 2024-04-03 NOTE — Progress Notes (Signed)
 Subjective:   James Mora is a 88 y.o. who presents for a Medicare Wellness preventive visit.  As a reminder, Annual Wellness Visits don't include a physical exam, and some assessments may be limited, especially if this visit is performed virtually. We may recommend an in-person follow-up visit with your provider if needed.  Visit Complete: Virtual I connected with  James Mora on 04/03/24 by a audio enabled telemedicine application and verified that I am speaking with the correct person using two identifiers.  Patient Location: Home  Provider Location: Office/Clinic  I discussed the limitations of evaluation and management by telemedicine. The patient expressed understanding and agreed to proceed.  Vital Signs: Because this visit was a virtual/telehealth visit, some criteria may be missing or patient reported. Any vitals not documented were not able to be obtained and vitals that have been documented are patient reported.  VideoDeclined- This patient declined Librarian, academic. Therefore the visit was completed with audio only.  Persons Participating in Visit: Patient.  AWV Questionnaire: No: Patient Medicare AWV questionnaire was not completed prior to this visit.  Cardiac Risk Factors include: advanced age (>108men, >48 women);dyslipidemia;hypertension;male gender     Objective:    There were no vitals filed for this visit. There is no height or weight on file to calculate BMI.     04/03/2024    9:49 AM 03/30/2023   10:43 AM 04/19/2022   10:08 AM 04/18/2021   10:49 AM 02/27/2021    2:14 AM 02/01/2021    4:45 PM 03/19/2016   10:03 PM  Advanced Directives  Does Patient Have a Medical Advance Directive? No No No No No No No   Would patient like information on creating a medical advance directive? No - Patient declined  No - Patient declined No - Patient declined No - Patient declined No - Patient declined No - patient declined information       Data saved with a previous flowsheet row definition    Current Medications (verified) Outpatient Encounter Medications as of 04/03/2024  Medication Sig   acetaminophen  (TYLENOL ) 500 MG tablet Take 500 mg by mouth every 6 (six) hours as needed.   aspirin  81 MG tablet Take 81 mg by mouth daily.   atorvastatin  (LIPITOR) 20 MG tablet TAKE 1 TABLET BY MOUTH AT 6 PM   carvedilol  (COREG ) 12.5 MG tablet TAKE 1 TABLET BY MOUTH TWICE DAILY   Cholecalciferol (VITAMIN D3) 125 MCG (5000 UT) CAPS Take 1 capsule (5,000 Units total) by mouth daily.   furosemide  (LASIX ) 20 MG tablet TAKE 1 TABLET(20 MG) BY MOUTH DAILY   levothyroxine  (SYNTHROID ) 50 MCG tablet TAKE 1 TABLET(50 MCG) BY MOUTH DAILY   lipase/protease/amylase (CREON ) 36000 UNITS CPEP capsule TAKE 1 CAPSULE BY MOUTH THREE TIMES DAILY WITH MEALS (Patient taking differently: TAKE 1 CAPSULE BY MOUTH THREE TIMES DAILY WITH MEALS TAKING ONCE PER DAY)   losartan (COZAAR) 25 MG tablet Take 25 mg by mouth daily.   nitroGLYCERIN  (NITROSTAT ) 0.4 MG SL tablet Place 0.4 mg under the tongue every 5 (five) minutes as needed.   omeprazole  (PRILOSEC) 20 MG capsule TAKE 2 CAPSULES(40 MG) BY MOUTH DAILY   spironolactone (ALDACTONE) 25 MG tablet Take 25 mg by mouth daily.   tamsulosin  (FLOMAX ) 0.4 MG CAPS capsule TAKE 1 CAPSULE(0.4 MG) BY MOUTH DAILY   Thiamine HCl (B-1) 100 MG TABS Take 1 tablet (100 mg total) by mouth daily.   amLODipine  (NORVASC ) 2.5 MG tablet TAKE 1 TABLET(2.5 MG)  BY MOUTH DAILY (Patient not taking: Reported on 04/03/2024)   No facility-administered encounter medications on file as of 04/03/2024.    Allergies (verified) Imodium [loperamide] and Hydroxyzine  hcl   History: Past Medical History:  Diagnosis Date   Acid reflux    Aortic insufficiency    a. 03/2016 Echo: EF 55-60%, no rwma, Gr1 DD, mild to mod AI, mild TR.   CAD (coronary artery disease)    a. 2000 Cath: Severe distal LAD disease with left to left collaterals;  b. 2008 MV:  non-ischemic; c. 03/2016 Cath: LM nl, LAD 30p, D1/D2 mod, nl, LCX nl, OM1/2 nl w/ L-->L collats to apical LAD, RCA dominant, nl. EF 55-65%.   Hypertensive heart disease 2005   unspecified   Mixed hyperlipidemia    mixed   Past Surgical History:  Procedure Laterality Date   CARDIAC CATHETERIZATION  2000   left heart   CARDIAC CATHETERIZATION N/A 03/22/2016   Procedure: Left Heart Cath And Coronary Angiography;  Surgeon: Evalene JINNY Lunger, MD;  Location: [facility] INVASIVE CV LAB;  Service: Cardiovascular;  Laterality: N/A;   COLONOSCOPY  2008   Dr. Jinny   Family History  Family history unknown: Yes   Social History   Socioeconomic History   Marital status: Married    Spouse name: Not on file   Number of children: 1   Years of education: Not on file   Highest education level: Not on file  Occupational History   Not on file  Tobacco Use   Smoking status: Never   Smokeless tobacco: Never  Vaping Use   Vaping status: Never Used  Substance and Sexual Activity   Alcohol use: No   Drug use: No   Sexual activity: Not on file  Other Topics Concern   Not on file  Social History Narrative   Not on file   Social Drivers of Health   Financial Resource Strain: Low Risk  (04/03/2024)   Overall Financial Resource Strain (CARDIA)    Difficulty of Paying Living Expenses: Not hard at all  Food Insecurity: No Food Insecurity (04/03/2024)   Hunger Vital Sign    Worried About Running Out of Food in the Last Year: Never true    Ran Out of Food in the Last Year: Never true  Transportation Needs: No Transportation Needs (04/03/2024)   PRAPARE - Administrator, Civil Service (Medical): No    Lack of Transportation (Non-Medical): No  Physical Activity: Insufficiently Active (04/03/2024)   Exercise Vital Sign    Days of Exercise per Week: 5 days    Minutes of Exercise per Session: 20 min  Stress: No Stress Concern Present (04/03/2024)   [employer] of Occupational Health -  Occupational Stress Questionnaire    Feeling of Stress: Not at all  Social Connections: Moderately Isolated (04/03/2024)   Social Connection and Isolation Panel    Frequency of Communication with Friends and Family: More than three times a week    Frequency of Social Gatherings with Friends and Family: Three times a week    Attends Religious Services: Never    Active Member of Clubs or Organizations: No    Attends Banker Meetings: Never    Marital Status: Married    Tobacco Counseling Counseling given: Not Answered    Clinical Intake:  Pre-visit preparation completed: Yes  Pain : No/denies pain     BMI - recorded: 25.4 Nutritional Status: BMI 25 -29 Overweight Nutritional Risks: None Diabetes: No  Lab  Results  Component Value Date   HGBA1C 6.2 (H) 12/09/2023   HGBA1C 6.2 (H) 05/24/2023   HGBA1C 6.3 (H) 11/22/2022     How often do you need to have someone help you when you read instructions, pamphlets, or other written materials from your doctor or pharmacy?: 1 - Never  Interpreter Needed?: No  Information entered by :: JHONNIE DAS, LPN   Activities of Daily Living     04/03/2024    9:51 AM  In your present state of health, do you have any difficulty performing the following activities:  Hearing? 0  Vision? 0  Difficulty concentrating or making decisions? 1  Walking or climbing stairs? 1  Dressing or bathing? 0  Doing errands, shopping? 1  Preparing Food and eating ? N  Using the Toilet? N  In the past six months, have you accidently leaked urine? N  Do you have problems with loss of bowel control? N  Managing your Medications? Y  Managing your Finances? Y  Housekeeping or managing your Housekeeping? Y    Patient Care Team: Ostwalt, Janna, PA-C as PCP - General (Physician Assistant) Darron Deatrice LABOR, MD as PCP - Cardiology (Cardiology) Darron Deatrice LABOR, MD as Consulting Physician (Cardiology) Pa, [facility] (Optometry)  I  have updated your Care Teams any recent Medical Services you may have received from other providers in the past year.     Assessment:   This is a routine wellness examination for [place].  Hearing/Vision screen Hearing Screening - Comments:: NO AIDS (BUT HE NEEDS THEM) Vision Screening - Comments:: WEARS GLASSES ALL DAY- [facility]   Goals Addressed             This Visit's Progress    DIET - INCREASE WATER INTAKE         Depression Screen     04/03/2024    9:44 AM 12/08/2023   10:27 AM 03/30/2023   10:37 AM 11/22/2022   10:01 AM 05/20/2022    9:42 AM 04/19/2022   10:04 AM 12/07/2021    9:11 AM  PHQ 2/9 Scores  PHQ - 2 Score 2 4 2 2 2 1 1   PHQ- 9 Score 3 18 4 12 7 1      Fall Risk     04/03/2024    9:49 AM 03/30/2023   10:20 AM 11/22/2022    9:58 AM 05/20/2022    9:41 AM 04/27/2022    9:46 AM  Fall Risk   Falls in the past year? 1 1 0 1 0  Number falls in past yr: 1 0 1    Injury with Fall? 0 0 1 0   Risk for fall due to : History of fall(s);Impaired mobility No Fall Risks Impaired balance/gait    Risk for fall due to: Comment NEUROPATHY IN FEET      Follow up Falls evaluation completed;Falls prevention discussed Education provided;Falls prevention discussed       MEDICARE RISK AT HOME:  Medicare Risk at Home Any stairs in or around the home?: Yes If so, are there any without handrails?: No Home free of loose throw rugs in walkways, pet beds, electrical cords, etc?: Yes Adequate lighting in your home to reduce risk of falls?: Yes Life alert?: No Use of a cane, walker or w/c?: Yes (CANE ALL THE TIME) Grab bars in the bathroom?: No Shower chair or bench in shower?: Yes Elevated toilet seat or a handicapped toilet?: Yes  TIMED UP AND GO:  Was the  test performed?  No  Cognitive Function: 6CIT completed        04/03/2024    9:53 AM 03/30/2023   10:44 AM 04/18/2021   10:58 AM 02/05/2020    1:31 PM  6CIT Screen  What Year? 0 points 0 points 4 points 0 points   What month? 0 points 0 points 3 points 0 points  What time? 0 points 0 points 0 points 0 points  Count back from 20 0 points 0 points 0 points 0 points  Months in reverse 2 points 4 points 2 points 2 points  Repeat phrase 0 points 2 points 8 points 2 points  Total Score 2 points 6 points 17 points 4 points    Immunizations Immunization History  Administered Date(s) Administered   Fluad Quad(high Dose 65+) 06/05/2021, 05/20/2022   Fluad Trivalent(High Dose 65+) 05/24/2023   INFLUENZA, HIGH DOSE SEASONAL PF 06/21/2019   PFIZER Comirnaty(Gray Top)Covid-19 Tri-Sucrose Vaccine 09/10/2019, 10/01/2019, 02/25/2021   PFIZER(Purple Top)SARS-COV-2 Vaccination 09/10/2019, 10/01/2019   PNEUMOCOCCAL CONJUGATE-20 05/24/2023   PPD Test 02/25/2021, 02/25/2021, 03/11/2021, 03/11/2021   Pneumococcal Conjugate-13 02/05/2020    Screening Tests Health Maintenance  Topic Date Due   DTaP/Tdap/Td (1 - Tdap) Never done   Zoster Vaccines- Shingrix (1 of 2) Never done   COVID-19 Vaccine (6 - 2024-25 season) 04/10/2023   INFLUENZA VACCINE  03/09/2024   Medicare Annual Wellness (AWV)  04/03/2025   Pneumococcal Vaccine: 50+ Years  Completed   HPV VACCINES  Aged Out   Meningococcal B Vaccine  Aged Out    Health Maintenance  Health Maintenance Due  Topic Date Due   DTaP/Tdap/Td (1 - Tdap) Never done   Zoster Vaccines- Shingrix (1 of 2) Never done   COVID-19 Vaccine (6 - 2024-25 season) 04/10/2023   INFLUENZA VACCINE  03/09/2024   Health Maintenance Items Addressed: AGED OUT OF COLONOSCOPY; UP TO DATE ON PNA- NEEDS COVID, TDAP  Additional Screening:  Vision Screening: Recommended annual ophthalmology exams for early detection of glaucoma and other disorders of the eye. Would you like a referral to an eye doctor? No    Dental Screening: Recommended annual dental exams for proper oral hygiene  Community Resource Referral / Chronic Care Management: CRR required this visit?  No   CCM required  this visit?  No   Plan:    I have personally reviewed and noted the following in the patient's chart:   Medical and social history Use of alcohol, tobacco or illicit drugs  Current medications and supplements including opioid prescriptions. Patient is not currently taking opioid prescriptions. Functional ability and status Nutritional status Physical activity Advanced directives List of other physicians Hospitalizations, surgeries, and ER visits in previous 12 months Vitals Screenings to include cognitive, depression, and falls Referrals and appointments  In addition, I have reviewed and discussed with patient certain preventive protocols, quality metrics, and best practice recommendations. A written personalized care plan for preventive services as well as general preventive health recommendations were provided to patient.   Jhonnie GORMAN Das, LPN   [id number]   After Visit Summary: (MyChart) Due to this being a telephonic visit, the after visit summary with patients personalized plan was offered to patient via MyChart   Notes: Nothing significant to report at this time.

## 2024-04-29 ENCOUNTER — Other Ambulatory Visit: Payer: Self-pay | Admitting: Physician Assistant

## 2024-05-11 ENCOUNTER — Encounter: Payer: Self-pay | Admitting: Physician Assistant

## 2024-05-11 ENCOUNTER — Ambulatory Visit: Admitting: Physician Assistant

## 2024-05-11 VITALS — BP 112/62 | HR 70 | Resp 16 | Ht 70.0 in | Wt 182.0 lb

## 2024-05-11 DIAGNOSIS — Z0000 Encounter for general adult medical examination without abnormal findings: Secondary | ICD-10-CM | POA: Diagnosis not present

## 2024-05-11 DIAGNOSIS — Z23 Encounter for immunization: Secondary | ICD-10-CM | POA: Diagnosis not present

## 2024-05-11 NOTE — Progress Notes (Signed)
 Complete physical exam  Patient: James Mora   DOB: Mar 23, 1936   88 y.o. Male  MRN: [id number] Visit Date: 05/11/2024  Today's healthcare provider: Jolynn Spencer, PA-C   Chief Complaint  Patient presents with   Annual Exam    Sleep Pattern: wife states does not get 8 hours but sleeps good Exercise: walks 1 hour daily in the morning 1 mile in. No concerns    Subjective    James Mora is a 88 y.o. male who presents today for a complete physical exam.   HPI     Annual Exam    Additional comments: Sleep Pattern: wife states does not get 8 hours but sleeps good Exercise: walks 1 hour daily in the morning 1 mile in. No concerns       Last edited by Wilfred Hargis RAMAN, CMA on 05/11/2024 10:12 AM.      Discussed the use of AI scribe software for clinical note transcription with the patient, who gave verbal consent to proceed.  History of Present Illness James Mora is an 88 year old male who presents for a follow-up visit.  He has low HDL cholesterol levels and maintains a healthy diet with regular physical activity. He experiences shakiness and uses a cane due to arthritis in his foot, which has resulted in significant disability. He has a history of foot injury with pins and rods and has used a walker previously.  He has mild anemia with slightly low hemoglobin levels. He has difficulty swallowing pills and occasional coughing when drinking, which has persisted for about a month and a half. He sometimes feels congested, especially in the spring due to allergies, but denies post-nasal drainage.  He experiences shortness of breath and a sensation of heaviness in his head when walking, requiring assistance to lift his head.  His kidney function has improved since the last check, and he is scheduled to see a nephrologist in November or December. He is due for a dermatology follow-up for skin issues. His diet is generally healthy, focusing on grilled foods, fruits, and  vegetables, while avoiding fried foods and limiting salads due to their laxative effect.    Last depression screening scores    04/03/2024    9:44 AM 12/08/2023   10:27 AM 03/30/2023   10:37 AM  PHQ 2/9 Scores  PHQ - 2 Score 2 4 2   PHQ- 9 Score 3 18 4    Last fall risk screening    04/03/2024    9:49 AM  Fall Risk   Falls in the past year? 1  Number falls in past yr: 1  Injury with Fall? 0  Risk for fall due to : History of fall(s);Impaired mobility  Risk for fall due to: Comment NEUROPATHY IN FEET  Follow up Falls evaluation completed;Falls prevention discussed   Last Audit-C alcohol use screening    04/03/2024    9:42 AM  Alcohol Use Disorder Test (AUDIT)  1. How often do you have a drink containing alcohol? 0  2. How many drinks containing alcohol do you have on a typical day when you are drinking? 0  3. How often do you have six or more drinks on one occasion? 0  AUDIT-C Score 0   A score of 3 or more in women, and 4 or more in men indicates increased risk for alcohol abuse, EXCEPT if all of the points are from question 1   Past Medical History:  Diagnosis Date   Acid reflux  Aortic insufficiency    a. 03/2016 Echo: EF 55-60%, no rwma, Gr1 DD, mild to mod AI, mild TR.   CAD (coronary artery disease)    a. 2000 Cath: Severe distal LAD disease with left to left collaterals;  b. 2008 MV: non-ischemic; c. 03/2016 Cath: LM nl, LAD 30p, D1/D2 mod, nl, LCX nl, OM1/2 nl w/ L-->L collats to apical LAD, RCA dominant, nl. EF 55-65%.   Hypertensive heart disease 2005   unspecified   Mixed hyperlipidemia    mixed   Past Surgical History:  Procedure Laterality Date   CARDIAC CATHETERIZATION  2000   left heart   CARDIAC CATHETERIZATION N/A 03/22/2016   Procedure: Left Heart Cath And Coronary Angiography;  Surgeon: Evalene JINNY Lunger, MD;  Location: [facility] INV[facility]IVE [facility];  Service: Cardiovascular;  Laterality: N/A;   COLONOSCOPY  2008   Dr. Jinny   Social History   Socioeconomic  History   Marital status: Married    Spouse name: Not on file   Number of children: 1   Years of education: Not on file   Highest education level: Not on file  Occupational History   Not on file  Tobacco Use   Smoking status: Never   Smokeless tobacco: Never  Vaping Use   Vaping status: Never Used  Substance and Sexual Activity   Alcohol use: No   Drug use: No   Sexual activity: Not on file  Other Topics Concern   Not on file  Social History Narrative   Not on file   Social Drivers of Health   Financial Resource Strain: Low Risk  (04/03/2024)   Overall Financial Resource Strain (CARDIA)    Difficulty of Paying Living Expenses: Not hard at all  Food Insecurity: No Food Insecurity (04/03/2024)   Hunger Vital Sign    Worried About Running Out of Food in the Last Year: Never true    Ran Out of Food in the Last Year: Never true  Transportation Needs: No Transportation Needs (04/03/2024)   PRAPARE - Administrator, Civil Service (Medical): No    Lack of Transportation (Non-Medical): No  Physical Activity: Insufficiently Active (04/03/2024)   Exercise Vital Sign    Days of Exercise per Week: 5 days    Minutes of Exercise per Session: 20 min  Stress: No Stress Concern Present (04/03/2024)   [employer] of Occupational Health - Occupational Stress Questionnaire    Feeling of Stress: Not at all  Social Connections: Moderately Isolated (04/03/2024)   Social Connection and Isolation Panel    Frequency of Communication with Friends and Family: More than three times a week    Frequency of Social Gatherings with Friends and Family: Three times a week    Attends Religious Services: Never    Active Member of Clubs or Organizations: No    Attends Banker Meetings: Never    Marital Status: Married  Catering manager Violence: Not At Risk (04/03/2024)   Humiliation, Afraid, Rape, and Kick questionnaire    Fear of Current or Ex-Partner: No    Emotionally Abused:  No    Physically Abused: No    Sexually Abused: No   Family Status  Relation Name Status   Mother  Deceased       killed when he was 34 months old   Father  Deceased       killed when he was 68 months old   Son  Alive  No partnership data on file  Family History  Family history unknown: Yes   Allergies  Allergen Reactions   Imodium [Loperamide] Nausea And Vomiting   Hydroxyzine  Hcl     Other reaction(s): Other (See Comments) Sedation and myoclonic jerks of all four extremities. Noted 02/19/21.     Patient Care Team: Alfie Rideaux, PA-C as PCP - General (Physician Assistant) Darron Deatrice LABOR, MD as PCP - Cardiology (Cardiology) Darron Deatrice LABOR, MD as Consulting Physician (Cardiology) Pa, [facility])   Medications: Outpatient Medications Prior to Visit  Medication Sig   acetaminophen  (TYLENOL ) 500 MG tablet Take 500 mg by mouth every 6 (six) hours as needed.   aspirin  81 MG tablet Take 81 mg by mouth daily.   atorvastatin  (LIPITOR) 20 MG tablet TAKE 1 TABLET BY MOUTH AT 6 PM   carvedilol  (COREG ) 12.5 MG tablet TAKE 1 TABLET BY MOUTH TWICE DAILY   Cholecalciferol (VITAMIN D3) 125 MCG (5000 UT) CAPS Take 1 capsule (5,000 Units total) by mouth daily.   furosemide  (LASIX ) 20 MG tablet TAKE 1 TABLET(20 MG) BY MOUTH DAILY   levothyroxine  (SYNTHROID ) 50 MCG tablet TAKE 1 TABLET(50 MCG) BY MOUTH DAILY   lipase/protease/amylase (CREON ) 36000 UNITS CPEP capsule TAKE 1 CAPSULE BY MOUTH THREE TIMES DAILY WITH MEALS (Patient taking differently: TAKE 1 CAPSULE BY MOUTH THREE TIMES DAILY WITH MEALS TAKING ONCE PER DAY)   losartan (COZAAR) 25 MG tablet Take 25 mg by mouth daily.   nitroGLYCERIN  (NITROSTAT ) 0.4 MG SL tablet Place 0.4 mg under the tongue every 5 (five) minutes as needed.   omeprazole  (PRILOSEC) 20 MG capsule TAKE 2 CAPSULES(40 MG) BY MOUTH DAILY   spironolactone (ALDACTONE) 25 MG tablet Take 25 mg by mouth daily.   tamsulosin  (FLOMAX ) 0.4 MG CAPS  capsule TAKE 1 CAPSULE(0.4 MG) BY MOUTH DAILY   Thiamine HCl (B-1) 100 MG TABS Take 1 tablet (100 mg total) by mouth daily.   [DISCONTINUED] amLODipine  (NORVASC ) 2.5 MG tablet TAKE 1 TABLET(2.5 MG) BY MOUTH DAILY (Patient not taking: Reported on 04/03/2024)   No facility-administered medications prior to visit.    Review of Systems Except see HPI     Objective    BP 112/62   Pulse 70   Resp 16   Ht 5' 10 (1.778 m)   Wt 182 lb (82.6 kg)   SpO2 97%   BMI 26.11 kg/m      Physical Exam Vitals reviewed.  Constitutional:      General: He is not in acute distress.    Appearance: Normal appearance. He is well-developed. He is not ill-appearing, toxic-appearing or diaphoretic.  HENT:     Head: Normocephalic and atraumatic.     Right Ear: Tympanic membrane, ear canal and external ear normal.     Left Ear: Tympanic membrane, ear canal and external ear normal.     Nose: Nose normal. No congestion or rhinorrhea.     Mouth/Throat:     Mouth: Mucous membranes are moist.     Pharynx: Oropharynx is clear. No oropharyngeal exudate.  Eyes:     General: No scleral icterus.       Right eye: No discharge.        Left eye: No discharge.     Conjunctiva/sclera: Conjunctivae normal.     Pupils: Pupils are equal, round, and reactive to light.  Neck:     Thyroid : No thyromegaly.     Vascular: No carotid bruit.  Cardiovascular:     Rate and Rhythm: Normal rate and regular rhythm.  Pulses: Normal pulses.     Heart sounds: Normal heart sounds. No murmur heard.    No friction rub. No gallop.  Pulmonary:     Effort: Pulmonary effort is normal. No respiratory distress.     Breath sounds: Normal breath sounds. No wheezing or rales.  Abdominal:     General: Abdomen is flat. Bowel sounds are normal. There is no distension.     Palpations: Abdomen is soft. There is no mass.     Tenderness: There is no abdominal tenderness. There is no right CVA tenderness, left CVA tenderness, guarding or  rebound.     Hernia: No hernia is present.  Musculoskeletal:        General: No swelling, tenderness, deformity or signs of injury. Normal range of motion.     Cervical back: Normal range of motion and neck supple. No rigidity or tenderness.     Right lower leg: No edema.     Left lower leg: No edema.  Lymphadenopathy:     Cervical: No cervical adenopathy.  Skin:    General: Skin is warm and dry.     Coloration: Skin is not jaundiced or pale.     Findings: No bruising, erythema, lesion or rash.  Neurological:     Mental Status: He is alert and oriented to person, place, and time. Mental status is at baseline.     Gait: Gait normal.  Psychiatric:        Mood and Affect: Mood normal.        Behavior: Behavior normal.        Thought Content: Thought content normal.        Judgment: Judgment normal.      No results found for any visits on 05/11/24.  Assessment & Plan    Routine Health Maintenance and Physical Exam  Exercise Activities and Dietary recommendations  Goals      DIET - EAT MORE FRUITS AND VEGETABLES     DIET - INCREASE WATER INTAKE     Track and Manage My Blood Pressure-Hypertension     Timeframe:  Long-Range Goal Priority:  High Start Date: 06/18/2021                            Expected End Date: 06/18/2022                      Follow Up within 90 days   - check blood pressure weekly    Why is this important?   You won't feel high blood pressure, but it can still hurt your blood vessels.  High blood pressure can cause heart or kidney problems. It can also cause a stroke.  Making lifestyle changes like losing a little weight or eating less salt will help.  Checking your blood pressure at home and at different times of the day can help to control blood pressure.  If the doctor prescribes medicine remember to take it the way the doctor ordered.  Call the office if you cannot afford the medicine or if there are questions about it.     Notes:          Immunization History  Administered Date(s) Administered   Fluad Quad(high Dose 65+) 06/05/2021, 05/20/2022   Fluad Trivalent(High Dose 65+) 05/24/2023   INFLUENZA, HIGH DOSE SEASONAL PF 06/21/2019   PFIZER Comirnaty(Gray Top)Covid-19 Tri-Sucrose Vaccine 09/10/2019, 10/01/2019, 02/25/2021   PFIZER(Purple Top)SARS-COV-2 Vaccination 09/10/2019, 10/01/2019   PNEUMOCOCCAL  CONJUGATE-20 05/24/2023   PPD Test 02/25/2021, 02/25/2021, 03/11/2021, 03/11/2021   Pneumococcal Conjugate-13 02/05/2020    Health Maintenance  Topic Date Due   DTaP/Tdap/Td vaccine (1 - Tdap) Never done   Zoster (Shingles) Vaccine (1 of 2) Never done   Flu Shot  03/09/2024   COVID-19 Vaccine (7 - 2025-26 season) 04/09/2024   Medicare Annual Wellness Visit  04/03/2025   Pneumococcal Vaccine for age over 40  Completed   HPV Vaccine  Aged Out   Meningitis B Vaccine  Aged Out    Discussed health benefits of physical activity, and encouraged him to engage in regular exercise appropriate for his age and condition.  Assessment & Plan Arthritis of the foot with prior injury and hardware Arthritis with significant disability due to prior injury and hardware, causing limited mobility. - Discuss potential for physical therapy with orthopedic doctor. - Consider referral to physical therapy if deemed appropriate by orthopedic doctor.  Gait instability and risk of falls Gait instability and fall risk due to arthritis and deconditioning. - Encourage use of walker for better stability. - Implement regular exercise routine with safety precautions. - Encourage standing and light exercises every 30-40 minutes.  Chronic kidney disease Chronic kidney disease with improved function. - Encourage adequate hydration. - Avoid NSAIDs. - Follow-up with nephrologist in November or December.  Anemia Chronic Mild anemia with slightly low hemoglobin. - Encourage consumption of iron-rich foods.  Hypothyroidism Well-managed  hypothyroidism with normal hormone levels. - Continue current dose of levothyroxine . - Re-evaluate thyroid  function at next visit.  Intermittent dysphagia Intermittent dysphagia possibly due to drinking technique. - Advise sipping water slowly. - Monitor for persistent swallowing difficulties.  Chronic nasal congestion Chronic nasal congestion with slightly enlarged lymph nodes. - Re-evaluate lymph nodes at next visit.  Low HDL cholesterol Low HDL cholesterol. - Encourage healthy diet and regular physical activity.   Annual physical exam (Primary)  Needs to updated dental/eye Things to do to keep yourself healthy  - Exercise at least 30-45 minutes a day, 3-4 days a week.  - Eat a low-fat diet with lots of fruits and vegetables, up to 7-9 servings per day.  - Seatbelts can save your life. Wear them always.  - Smoke detectors on every level of your home, check batteries every year.  - Eye Doctor - have an eye exam every 1-2 years  - Safe sex - if you may be exposed to STDs, use a condom.  - Alcohol -  If you drink, do it moderately, less than 2 drinks per day.  - Health Care Power of Attorney. Choose someone to speak for you if you are not able.  - Depression is common in our stressful world.If you're feeling down or losing interest in things you normally enjoy, please come in for a visit.  - Violence - If anyone is threatening or hurting you, please call immediately.  Immunization due  - Flu vaccine HIGH DOSE PF(Fluzone Trivalent)   No follow-ups on file.    The patient was advised to call back or seek an in-person evaluation if the symptoms worsen or if the condition fails to improve as anticipated.  I discussed the assessment and treatment plan with the patient. The patient was provided an opportunity to ask questions and all were answered. The patient agreed with the plan and demonstrated an understanding of the instructions.  I, Federick Levene, PA-C have reviewed all  documentation for this visit. The documentation on 05/11/2024  for the exam, diagnosis, procedures,  and orders are all accurate and complete.  Jolynn Spencer, [facility], MMS [facility] [phone] (phone) [phone] (fax)  [facility]

## 2024-05-21 ENCOUNTER — Other Ambulatory Visit: Payer: Self-pay | Admitting: Physician Assistant

## 2024-05-21 DIAGNOSIS — E7800 Pure hypercholesterolemia, unspecified: Secondary | ICD-10-CM

## 2024-05-21 DIAGNOSIS — I10 Essential (primary) hypertension: Secondary | ICD-10-CM

## 2024-06-05 ENCOUNTER — Other Ambulatory Visit (HOSPITAL_COMMUNITY): Payer: Self-pay

## 2024-06-11 ENCOUNTER — Other Ambulatory Visit: Payer: Self-pay | Admitting: Physician Assistant

## 2024-07-09 ENCOUNTER — Telehealth: Payer: Self-pay | Admitting: Physician Assistant

## 2024-07-12 NOTE — Telephone Encounter (Signed)
 Spoke with Magie pt wife. Pt wife stated pt is on Creon  medication, they have an application that is needing to be filled out but is waiting on their nurse to come help them fill out forms and once completed she would drop it off here so we can fill it out.

## 2024-07-14 ENCOUNTER — Other Ambulatory Visit: Payer: Self-pay | Admitting: Physician Assistant

## 2024-07-14 DIAGNOSIS — K2100 Gastro-esophageal reflux disease with esophagitis, without bleeding: Secondary | ICD-10-CM

## 2024-07-27 ENCOUNTER — Other Ambulatory Visit: Payer: Self-pay | Admitting: Physician Assistant

## 2024-07-27 ENCOUNTER — Telehealth: Payer: Self-pay

## 2024-07-27 NOTE — Progress Notes (Signed)
" ° °  07/27/2024  Albino PARAS Weigand  DOB: 1936/03/29 MRN: [id number]  Contacted wife regarding patient assistance enrollment for Creon . It appears that that was historically completed by Marolyn, previous PharmD. It is unclear who re-enrolled patient for 2025.  Per wife, she received documents from the company in the mail that were dropped off to clinic today. She also stated that a representative from [employer] told her that Junior was pre-approved for 2026.   Trayvon Trumbull E. Marsh, PharmD Clinical Pharmacist [facility] [phone]  "

## 2024-08-01 ENCOUNTER — Telehealth: Payer: Self-pay

## 2024-08-01 NOTE — Telephone Encounter (Signed)
 Filled and faxed provider portion to provider office to sign and date,have received pt portion.

## 2024-08-07 NOTE — Telephone Encounter (Signed)
 Received provider portion [employer] ) faxed to [employer] along pt portion and ins card.

## 2024-08-13 ENCOUNTER — Ambulatory Visit: Admitting: Physician Assistant

## 2024-08-17 ENCOUNTER — Encounter: Payer: Self-pay | Admitting: Physician Assistant

## 2024-08-17 ENCOUNTER — Ambulatory Visit (INDEPENDENT_AMBULATORY_CARE_PROVIDER_SITE_OTHER): Admitting: Physician Assistant

## 2024-08-17 VITALS — BP 105/66 | HR 76 | Wt 186.0 lb

## 2024-08-17 DIAGNOSIS — R7303 Prediabetes: Secondary | ICD-10-CM

## 2024-08-17 DIAGNOSIS — E7800 Pure hypercholesterolemia, unspecified: Secondary | ICD-10-CM | POA: Diagnosis not present

## 2024-08-17 DIAGNOSIS — I10 Essential (primary) hypertension: Secondary | ICD-10-CM | POA: Diagnosis not present

## 2024-08-17 DIAGNOSIS — E038 Other specified hypothyroidism: Secondary | ICD-10-CM

## 2024-08-17 DIAGNOSIS — L97319 Non-pressure chronic ulcer of right ankle with unspecified severity: Secondary | ICD-10-CM

## 2024-08-17 DIAGNOSIS — L03115 Cellulitis of right lower limb: Secondary | ICD-10-CM | POA: Diagnosis not present

## 2024-08-17 DIAGNOSIS — N1831 Chronic kidney disease, stage 3a: Secondary | ICD-10-CM | POA: Diagnosis not present

## 2024-08-17 DIAGNOSIS — R7989 Other specified abnormal findings of blood chemistry: Secondary | ICD-10-CM

## 2024-08-17 MED ORDER — DOXYCYCLINE HYCLATE 100 MG PO TABS: 100.0000 mg | ORAL_TABLET | Freq: Two times a day (BID) | ORAL | 0 refills | Status: DC

## 2024-08-17 NOTE — Progress Notes (Signed)
 " Established patient visit  Patient: James Mora   DOB: Aug 09, 1936   89 y.o. Male  MRN: [id number] Visit Date: 08/17/2024  Today's healthcare provider: Jolynn Spencer, PA-C   Chief Complaint  Patient presents with   Medical Management of Chronic Issues    Follow-up HTN, hypercholesterolemia,prediabetes   Subjective     HPI     Medical Management of Chronic Issues    Additional comments: Follow-up HTN, hypercholesterolemia,prediabetes      Last edited by Rosas, Joseline E, CMA on 08/17/2024 10:24 AM.       Discussed the use of AI scribe software for clinical note transcription with the patient, who gave verbal consent to proceed.  History of Present Illness James Mora is an 89 year old male who presents with foot swelling and oozing wounds.  He has a remote right foot crush injury with pins and now has increased right foot swelling and black and blue discoloration with two oozing areas. The foot is wrapped. He has not seen podiatry in several years.  His right ankle and leg are also swollen, and the foot appears bruised and reddish with skin tears and drainage. He uses a silicone-type dressing from a prior foot visit. He has minimal pain despite the discoloration and swelling.  He has anemia with previously low hemoglobin.  He has decreased kidney function on prior labs. He drinks about 16 ounces of water daily and mainly drinks diet Sprite.  He avoids exercise and walking due to his foot condition, and his caregiver restricts him from walking to prevent worsening foot problems.       04/03/2024    9:44 AM 12/08/2023   10:27 AM 03/30/2023   10:37 AM  Depression screen PHQ 2/9  Decreased Interest 1 3 1   Down, Depressed, Hopeless 1 1 1   PHQ - 2 Score 2 4 2   Altered sleeping 1 2 0  Tired, decreased energy 0 3 1  Change in appetite 0 0 0  Feeling bad or failure about yourself  0 3 1  Trouble concentrating 0 3 0  Moving slowly or fidgety/restless 0 3 0   Suicidal thoughts 0 0 0  PHQ-9 Score 3  18  4    Difficult doing work/chores Not difficult at all Somewhat difficult Not difficult at all     Data saved with a previous flowsheet row definition      12/08/2023   10:27 AM  GAD 7 : Generalized Anxiety Score  Nervous, Anxious, on Edge 3  Control/stop worrying 1  Worry too much - different things 3  Trouble relaxing 3  Restless 2  Easily annoyed or irritable 1  Afraid - awful might happen 2  Total GAD 7 Score 15  Anxiety Difficulty Somewhat difficult    Medications: Show/hide medication list[1]  Review of Systems All negative Except see HPI       Objective    BP 105/66 (BP Location: Left Arm, Patient Position: Sitting, Cuff Size: Normal)   Pulse 76   Wt 186 lb (84.4 kg)   SpO2 97%   BMI 26.69 kg/m     Physical Exam Vitals reviewed.  Constitutional:      General: He is not in acute distress.    Appearance: Normal appearance. He is not diaphoretic.  HENT:     Head: Normocephalic and atraumatic.  Eyes:     General: No scleral icterus.    Conjunctiva/sclera: Conjunctivae normal.  Cardiovascular:     Rate and Rhythm:  Normal rate and regular rhythm.     Pulses: Normal pulses.     Heart sounds: Normal heart sounds. No murmur heard. Pulmonary:     Effort: Pulmonary effort is normal. No respiratory distress.     Breath sounds: Normal breath sounds. No wheezing or rhonchi.  Musculoskeletal:     Cervical back: Neck supple.     Right lower leg: No edema.     Left lower leg: No edema.  Lymphadenopathy:     Cervical: No cervical adenopathy.  Skin:    General: Skin is warm and dry.     Findings: Lesion (right leg) present. No rash.  Neurological:     Mental Status: He is alert and oriented to person, place, and time. Mental status is at baseline.  Psychiatric:        Mood and Affect: Mood normal.        Behavior: Behavior normal.      No results found for any visits on 08/17/24.      Assessment and  Plan Assessment & Plan Chronic right foot ulcer with venous insufficiency Chronic ulcer with swelling, bruising, and oozing. Post trauma a few years ago. Last visit with podiatry 3 years ago - Referred to podiatry for evaluation and management. - Referred to wound clinic for care. - Prescribed antibiotics. - Ordered ultrasound to rule out DVT. - Instructed on saline wound care/antibiotics topical and on wound care. Will follow-up  Stage 3a chronic kidney disease Low kidney function with limited fluid intake. - Encouraged increased water intake. - Continue nephrology follow-up.  Anemia Previous low hemoglobin levels. - Ordered lab work to recheck hemoglobin. Will follow-up  General Health Maintenance Discussed exercise for circulation and thrombosis prevention. - Encouraged regular physical activity.  Essential hypertension (Primary)  - CBC with Differential/Platelet - Comprehensive metabolic panel with GFR  Other specified hypothyroidism  tsh   Prediabetes  - Hemoglobin A1c  Pure hypercholesterolemia  - Lipid Panel With LDL/HDL Ratio    Ulcer of right ankle, unspecified ulcer stage (HCC) - Ambulatory referral to Podiatry - Sed Rate (ESR) - C-reactive protein - US  Venous Img Lower Unilateral Right (DVT); Future - Ambulatory referral to Wound Clinic - doxycycline  (VIBRA -TABS) 100 MG tablet; Take 1 tablet (100 mg total) by mouth 2 (two) times daily.  Dispense: 28 tablet; Refill: 0  Cellulitis of right lower extremity  - doxycycline  (VIBRA -TABS) 100 MG tablet; Take 1 tablet (100 mg total) by mouth 2 (two) times daily.  Dispense: 28 tablet; Refill: 0    Orders Placed This Encounter  Procedures   US  Venous Img Lower Unilateral Right (DVT)    Standing Status:   Future    Expiration Date:   08/24/2024    Reason for Exam (SYMPTOM  OR DIAGNOSIS REQUIRED):   Right leg pain and swelling    Preferred imaging location?:   [facility]   CBC with  Differential/Platelet   Comprehensive metabolic panel with GFR   Hemoglobin A1c   Lipid Panel With LDL/HDL Ratio   TSH   Sed Rate (ESR)   C-reactive protein   Ambulatory referral to Podiatry    Referral Priority:   Urgent    Referral Type:   Consultation    Referral Reason:   Specialty Services Required    Requested Specialty:   Podiatry    Number of Visits Requested:   1   Ambulatory referral to Wound Clinic    Referral Priority:   Urgent    Referral Type:  Consultation    Referral Reason:   Specialty Services Required    Requested Specialty:   Wound Care    Number of Visits Requested:   1    No follow-ups on file.   The patient was advised to call back or seek an in-person evaluation if the symptoms worsen or if the condition fails to improve as anticipated.  I discussed the assessment and treatment plan with the patient. The patient was provided an opportunity to ask questions and all were answered. The patient agreed with the plan and demonstrated an understanding of the instructions.  I, Mariko Nowakowski, PA-C have reviewed all documentation for this visit. The documentation on 08/17/2024  for the exam, diagnosis, procedures, and orders are all accurate and complete.  Jolynn Spencer, [facility], MMS [facility] [phone] (phone) [phone] (fax)  [facility]    [1]  Outpatient Medications Prior to Visit  Medication Sig   acetaminophen  (TYLENOL ) 500 MG tablet Take 500 mg by mouth every 6 (six) hours as needed.   aspirin  81 MG tablet Take 81 mg by mouth daily.   atorvastatin  (LIPITOR) 20 MG tablet TAKE 1 TABLET BY MOUTH AT 6 PM   carvedilol  (COREG ) 12.5 MG tablet TAKE 1 TABLET BY MOUTH TWICE DAILY   Cholecalciferol (VITAMIN D3) 125 MCG (5000 UT) CAPS Take 1 capsule (5,000 Units total) by mouth daily.   furosemide  (LASIX ) 20 MG tablet TAKE 1 TABLET(20 MG) BY MOUTH DAILY   levothyroxine  (SYNTHROID ) 50 MCG tablet TAKE 1 TABLET(50 MCG) BY MOUTH DAILY    lipase/protease/amylase (CREON ) 36000 UNITS CPEP capsule TAKE 1 CAPSULE BY MOUTH THREE TIMES DAILY WITH MEALS (Patient taking differently: TAKE 1 CAPSULE BY MOUTH THREE TIMES DAILY WITH MEALS TAKING ONCE PER DAY)   losartan (COZAAR) 25 MG tablet Take 25 mg by mouth daily.   nitroGLYCERIN  (NITROSTAT ) 0.4 MG SL tablet Place 0.4 mg under the tongue every 5 (five) minutes as needed.   omeprazole  (PRILOSEC) 20 MG capsule TAKE 2 CAPSULES(40 MG) BY MOUTH DAILY   spironolactone (ALDACTONE) 25 MG tablet Take 25 mg by mouth daily.   tamsulosin  (FLOMAX ) 0.4 MG CAPS capsule TAKE 1 CAPSULE(0.4 MG) BY MOUTH DAILY   Thiamine HCl (B-1) 100 MG TABS Take 1 tablet (100 mg total) by mouth daily.   No facility-administered medications prior to visit.   "

## 2024-08-18 ENCOUNTER — Other Ambulatory Visit: Payer: Self-pay | Admitting: Physician Assistant

## 2024-08-18 DIAGNOSIS — E7800 Pure hypercholesterolemia, unspecified: Secondary | ICD-10-CM

## 2024-08-18 DIAGNOSIS — I10 Essential (primary) hypertension: Secondary | ICD-10-CM

## 2024-08-18 LAB — CBC WITH DIFFERENTIAL/PLATELET
Basophils Absolute: 0 x10E3/uL (ref 0.0–0.2)
Basos: 0 %
EOS (ABSOLUTE): 0.1 x10E3/uL (ref 0.0–0.4)
Eos: 1 %
Hematocrit: 42.8 % (ref 37.5–51.0)
Hemoglobin: 13.3 g/dL (ref 13.0–17.7)
Immature Grans (Abs): 0 x10E3/uL (ref 0.0–0.1)
Immature Granulocytes: 0 %
Lymphocytes Absolute: 1.6 x10E3/uL (ref 0.7–3.1)
Lymphs: 18 %
MCH: 30.5 pg (ref 26.6–33.0)
MCHC: 31.1 g/dL — ABNORMAL LOW (ref 31.5–35.7)
MCV: 98 fL — ABNORMAL HIGH (ref 79–97)
Monocytes Absolute: 0.5 x10E3/uL (ref 0.1–0.9)
Monocytes: 6 %
Neutrophils Absolute: 6.6 x10E3/uL (ref 1.4–7.0)
Neutrophils: 74 %
Platelets: 228 x10E3/uL (ref 150–450)
RBC: 4.36 x10E6/uL (ref 4.14–5.80)
RDW: 13.1 % (ref 11.6–15.4)
WBC: 8.9 x10E3/uL (ref 3.4–10.8)

## 2024-08-18 LAB — LIPID PANEL WITH LDL/HDL RATIO
Cholesterol, Total: 115 mg/dL (ref 100–199)
HDL: 35 mg/dL — ABNORMAL LOW
LDL Chol Calc (NIH): 54 mg/dL (ref 0–99)
LDL/HDL Ratio: 1.5 ratio (ref 0.0–3.6)
Triglycerides: 154 mg/dL — ABNORMAL HIGH (ref 0–149)
VLDL Cholesterol Cal: 26 mg/dL (ref 5–40)

## 2024-08-18 LAB — COMPREHENSIVE METABOLIC PANEL WITH GFR
ALT: 14 IU/L (ref 0–44)
AST: 12 IU/L (ref 0–40)
Albumin: 4.2 g/dL (ref 3.7–4.7)
Alkaline Phosphatase: 118 IU/L (ref 48–129)
BUN/Creatinine Ratio: 20 (ref 10–24)
BUN: 30 mg/dL — ABNORMAL HIGH (ref 8–27)
Bilirubin Total: 0.7 mg/dL (ref 0.0–1.2)
CO2: 25 mmol/L (ref 20–29)
Calcium: 9.5 mg/dL (ref 8.6–10.2)
Chloride: 105 mmol/L (ref 96–106)
Creatinine, Ser: 1.53 mg/dL — ABNORMAL HIGH (ref 0.76–1.27)
Globulin, Total: 2.9 g/dL (ref 1.5–4.5)
Glucose: 109 mg/dL — ABNORMAL HIGH (ref 70–99)
Potassium: 4.9 mmol/L (ref 3.5–5.2)
Sodium: 143 mmol/L (ref 134–144)
Total Protein: 7.1 g/dL (ref 6.0–8.5)
eGFR: 44 mL/min/1.73 — ABNORMAL LOW

## 2024-08-18 LAB — C-REACTIVE PROTEIN: CRP: 2 mg/L (ref 0–10)

## 2024-08-18 LAB — HEMOGLOBIN A1C
Est. average glucose Bld gHb Est-mCnc: 131 mg/dL
Hgb A1c MFr Bld: 6.2 % — ABNORMAL HIGH (ref 4.8–5.6)

## 2024-08-18 LAB — SEDIMENTATION RATE: Sed Rate: 15 mm/h (ref 0–30)

## 2024-08-18 LAB — TSH: TSH: 3.7 u[IU]/mL (ref 0.450–4.500)

## 2024-08-20 ENCOUNTER — Ambulatory Visit: Payer: Self-pay | Admitting: Physician Assistant

## 2024-08-23 NOTE — Telephone Encounter (Signed)
 Received approval letter from [place] ([employer] ) thru 08/08/2025,approval letter index.

## 2024-08-31 ENCOUNTER — Ambulatory Visit: Admitting: Podiatry

## 2024-08-31 ENCOUNTER — Encounter: Payer: Self-pay | Admitting: Podiatry

## 2024-08-31 VITALS — Ht 70.0 in | Wt 186.0 lb

## 2024-08-31 DIAGNOSIS — L97319 Non-pressure chronic ulcer of right ankle with unspecified severity: Secondary | ICD-10-CM | POA: Diagnosis not present

## 2024-08-31 DIAGNOSIS — I83013 Varicose veins of right lower extremity with ulcer of ankle: Secondary | ICD-10-CM | POA: Diagnosis not present

## 2024-08-31 NOTE — Progress Notes (Signed)
 "  Chief Complaint  Patient presents with   Wound Check    Pt is here due to ulcer to the right ankle, wife states that the area has been open for about 3 weeks states she has been keeping the area clean and bandage, area is draining puss and blood.    HPI: 89 y.o. malepresenting for evaluation of a chronic ulcer to the medial aspect of the right ankle secondary to venous insufficiency  Past Medical History:  Diagnosis Date   Acid reflux    Aortic insufficiency    a. 03/2016 Echo: EF 55-60%, no rwma, Gr1 DD, mild to mod AI, mild TR.   CAD (coronary artery disease)    a. 2000 Cath: Severe distal LAD disease with left to left collaterals;  b. 2008 MV: non-ischemic; c. 03/2016 Cath: LM nl, LAD 30p, D1/D2 mod, nl, LCX nl, OM1/2 nl w/ L-->L collats to apical LAD, RCA dominant, nl. EF 55-65%.   Hypertensive heart disease 2005   unspecified   Mixed hyperlipidemia    mixed    Past Surgical History:  Procedure Laterality Date   CARDIAC CATHETERIZATION  2000   left heart   CARDIAC CATHETERIZATION N/A 03/22/2016   Procedure: Left Heart Cath And Coronary Angiography;  Surgeon: Evalene JINNY Lunger, MD;  Location: [facility];  Service: Cardiovascular;  Laterality: N/A;   COLONOSCOPY  2008   Dr. Jinny    Allergies[1]   RT medial ankle 08/31/2024  Physical Exam: General: The patient is alert and oriented x3 in no acute distress.  Dermatology: Skin is warm, dry and supple bilateral lower extremities.  Chronic venous ulcer noted to the medial aspect of the right ankle overall measuring approximately 8.0 x 5.0 x 0.2 cm.  Intermixed granular and fibrotic tissue noted.  There is no exposed bone muscle tendon ligament or joint.  No malodor.  Today there is no purulent drainage.  Serosanguineous drainage noted  Vascular: Skin is warm to touch.  Varicosities noted with chronic venous insufficiency  Neurological: Grossly intact via light touch  Musculoskeletal Exam: No pedal deformities  noted   Assessment/Plan of Care: 1.  Chronic venous ulcer medial aspect of the right ankle  -Medically necessary excisional debridement including subcutaneous tissue was performed today using a tissue nipper.  Excisional debridement of the necrotic nonviable tissue down to healthier bleeding viable tissue was performed with postdebridement measurement same as pre- - Multilayer Unna boot compression wrap applied to the right lower extremity up to the level of the knee.  Leave clean dry and intact x 1 week -There is not appear to be any deeper underlying cellulitis today.  No antibiotics sent to the pharmacy -Postop shoe dispensed.  WBAT -Return to clinic 1 week     Thresa EMERSON Sar, DPM [facility]  Dr. Thresa EMERSON Sar, DPM    [address] [place], [place] [address]                Office [phone]  Fax [phone]        [1]  Allergies Allergen Reactions   Imodium [Loperamide] Nausea And Vomiting   Hydroxyzine  Hcl     Other reaction(s): Other (See Comments) Sedation and myoclonic jerks of all four extremities.  Noted 02/19/21.    "

## 2024-09-07 ENCOUNTER — Ambulatory Visit: Admitting: Podiatry

## 2024-09-07 DIAGNOSIS — L03115 Cellulitis of right lower limb: Secondary | ICD-10-CM

## 2024-09-07 DIAGNOSIS — I83013 Varicose veins of right lower extremity with ulcer of ankle: Secondary | ICD-10-CM | POA: Diagnosis not present

## 2024-09-07 DIAGNOSIS — L97319 Non-pressure chronic ulcer of right ankle with unspecified severity: Secondary | ICD-10-CM | POA: Diagnosis not present

## 2024-09-07 MED ORDER — DOXYCYCLINE HYCLATE 100 MG PO TABS: 100.0000 mg | ORAL_TABLET | Freq: Two times a day (BID) | ORAL | 0 refills | Status: AC

## 2024-09-07 NOTE — Progress Notes (Signed)
 "  Chief Complaint  Patient presents with   Foot Ulcer    Pt is here to f/u on right ankle due to ulcer, he has no complaints, unna boot was taken off some redness to the area with some drainage.    HPI: 89 y.o. malepresenting for evaluation of a chronic ulcer to the medial aspect of the right ankle secondary to venous insufficiency.  Overall significant improvement  Past Medical History:  Diagnosis Date   Acid reflux    Aortic insufficiency    a. 03/2016 Echo: EF 55-60%, no rwma, Gr1 DD, mild to mod AI, mild TR.   CAD (coronary artery disease)    a. 2000 Cath: Severe distal LAD disease with left to left collaterals;  b. 2008 MV: non-ischemic; c. 03/2016 Cath: LM nl, LAD 30p, D1/D2 mod, nl, LCX nl, OM1/2 nl w/ L-->L collats to apical LAD, RCA dominant, nl. EF 55-65%.   Hypertensive heart disease 2005   unspecified   Mixed hyperlipidemia    mixed    Past Surgical History:  Procedure Laterality Date   CARDIAC CATHETERIZATION  2000   left heart   CARDIAC CATHETERIZATION N/A 03/22/2016   Procedure: Left Heart Cath And Coronary Angiography;  Surgeon: Evalene JINNY Lunger, MD;  Location: [facility];  Service: Cardiovascular;  Laterality: N/A;   COLONOSCOPY  2008   Dr. Jinny    Allergies[1]   RT medial ankle 08/31/2024   RT ankle 09/07/2024  Physical Exam: General: The patient is alert and oriented x3 in no acute distress.  Dermatology: Skin is warm, dry and supple bilateral lower extremities.  There continues to be some slight redness and warmth around the medial aspect of the right ankle.  Chronic venous ulcer noted to the medial aspect of the right ankle overall measuring approximately 8.0 x 5.0 x 0.2 cm.  Intermixed granular and fibrotic tissue noted.  There is no exposed bone muscle tendon ligament or joint.  No malodor.  Today there is no purulent drainage.  Serosanguineous drainage noted  Vascular: Skin is warm to touch.  Varicosities noted with chronic venous  insufficiency  Neurological: Grossly intact via light touch  Musculoskeletal Exam: No pedal deformities noted   Assessment/Plan of Care: 1.  Chronic venous ulcer medial aspect of the right ankle 2.  History of cellulitis right leg  -Overall there is significant improvement today -Medically necessary excisional debridement including subcutaneous tissue was performed today using a tissue nipper.  Excisional debridement of the necrotic nonviable tissue down to healthier bleeding viable tissue was performed with postdebridement measurement same as pre- -reapplication of multilayer Unna boot compression wrap applied to the right lower extremity up to the level of the knee.  Leave clean dry and intact x 1 week - Just to be safe I did go ahead and call in a refill prescription of the doxycycline  100 mg twice daily x 10 days that was originally prescribed by his PCP -Postop shoe dispensed.  WBAT -Return to clinic 1 week     Thresa EMERSON Sar, DPM [facility]  Dr. Thresa EMERSON Sar, DPM    [address] [place] [address]                Office [phone]  [phone]  Fax [phone]        [1]  Allergies Allergen Reactions   Imodium [Loperamide] Nausea And Vomiting   Hydroxyzine  Hcl     Other reaction(s): Other (See Comments) Sedation and myoclonic jerks of all four extremities. Noted 02/19/21.    "

## 2024-09-14 ENCOUNTER — Ambulatory Visit

## 2024-09-14 ENCOUNTER — Ambulatory Visit: Admitting: Podiatry

## 2024-09-14 ENCOUNTER — Encounter: Payer: Self-pay | Admitting: Podiatry

## 2024-09-14 VITALS — Ht 70.0 in | Wt 186.0 lb

## 2024-09-14 DIAGNOSIS — L03115 Cellulitis of right lower limb: Secondary | ICD-10-CM

## 2024-09-14 DIAGNOSIS — L97319 Non-pressure chronic ulcer of right ankle with unspecified severity: Secondary | ICD-10-CM

## 2024-09-14 DIAGNOSIS — I872 Venous insufficiency (chronic) (peripheral): Secondary | ICD-10-CM

## 2024-09-14 NOTE — Progress Notes (Signed)
 "  Chief Complaint  Patient presents with   Foot Ulcer    Pt is here to f/u on right ankle due to ulcer, unna boot was removed, still some drainage from the site.    HPI: 89 y.o. malepresenting for evaluation of a chronic ulcer to the medial aspect of the right ankle secondary to venous insufficiency.    Past Medical History:  Diagnosis Date   Acid reflux    Aortic insufficiency    a. 03/2016 Echo: EF 55-60%, no rwma, Gr1 DD, mild to mod AI, mild TR.   CAD (coronary artery disease)    a. 2000 Cath: Severe distal LAD disease with left to left collaterals;  b. 2008 MV: non-ischemic; c. 03/2016 Cath: LM nl, LAD 30p, D1/D2 mod, nl, LCX nl, OM1/2 nl w/ L-->L collats to apical LAD, RCA dominant, nl. EF 55-65%.   Hypertensive heart disease 2005   unspecified   Mixed hyperlipidemia    mixed    Past Surgical History:  Procedure Laterality Date   CARDIAC CATHETERIZATION  2000   left heart   CARDIAC CATHETERIZATION N/A 03/22/2016   Procedure: Left Heart Cath And Coronary Angiography;  Surgeon: Evalene JINNY Lunger, MD;  Location: [facility];  Service: Cardiovascular;  Laterality: N/A;   COLONOSCOPY  2008   Dr. Jinny    Allergies[1]   RT medial ankle 08/31/2024   RT ankle 09/07/2024  Physical Exam: General: The patient is alert and oriented x3 in no acute distress.  Dermatology: Skin is warm, dry and supple bilateral lower extremities.  There continues to be some slight redness and warmth around the medial aspect of the right ankle.  Chronic venous ulcer noted to the medial aspect of the right ankle overall measuring approximately 8.0 x 5.0 x 0.2 cm.  Intermixed granular and fibrotic tissue noted.  There is no exposed bone muscle tendon ligament or joint.  No malodor.  Today there is no purulent drainage.  Serosanguineous drainage noted  Vascular: Skin is warm to touch.  Varicosities noted with chronic venous insufficiency  Neurological: Grossly intact via light  touch  Musculoskeletal Exam: No pedal deformities noted  Radiographic exam RT foot and ankle 09/14/2024: Advanced severe degenerative changes and arthritis noted throughout the ankle and midtarsal joints of the right foot.  There is a portion of K wire embedded within the body of the talus which appears stable and intact without any complicating features.  No acute fractures identified.   Assessment/Plan of Care: 1.  Chronic venous ulcer medial aspect of the right ankle 2.  Cellulitis right leg  - Patient evaluated.  Stable-Medically necessary excisional debridement including subcutaneous tissue was performed today using a tissue nipper.  Excisional debridement of the necrotic nonviable tissue down to healthier bleeding viable tissue was performed with postdebridement measurement same as pre- -reapplication of multilayer Unna boot compression wrap applied to the right lower extremity up to the level of the knee.  Leave clean dry and intact x 1 week.  Prior to application of the [employer] I did apply Prisma collagen with overlying Hydrofera Blue - Continue doxycycline  100 mg twice daily until completed -Postop shoe dispensed.  WBAT -Return to clinic 1 week     Thresa EMERSON Sar, DPM [facility]  Dr. Thresa EMERSON Sar, DPM    [address]  [place], [place] [address]                Office [phone]  Fax [phone]        [1]  Allergies Allergen Reactions   Imodium [Loperamide] Nausea And Vomiting   Hydroxyzine  Hcl     Other reaction(s): Other (See Comments) Sedation and myoclonic jerks of all four extremities. Noted 02/19/21.    "

## 2024-09-21 ENCOUNTER — Ambulatory Visit: Admitting: Podiatry

## 2024-10-10 ENCOUNTER — Encounter: Admitting: Physician Assistant

## 2025-04-09 ENCOUNTER — Ambulatory Visit
